# Patient Record
Sex: Male | Born: 1940 | Race: Black or African American | Hispanic: No | Marital: Married | Smoking: Former smoker
Health system: Southern US, Community
[De-identification: ages and names within clinical notes are randomized; demographics above are authoritative.]

## PROBLEM LIST (undated history)

## (undated) DIAGNOSIS — R0600 Dyspnea, unspecified: Secondary | ICD-10-CM

## (undated) DIAGNOSIS — N182 Chronic kidney disease, stage 2 (mild): Secondary | ICD-10-CM

## (undated) DIAGNOSIS — N4340 Spermatocele of epididymis, unspecified: Secondary | ICD-10-CM

## (undated) DIAGNOSIS — Z9289 Personal history of other medical treatment: Secondary | ICD-10-CM

## (undated) DIAGNOSIS — R011 Cardiac murmur, unspecified: Secondary | ICD-10-CM

## (undated) DIAGNOSIS — D649 Anemia, unspecified: Secondary | ICD-10-CM

## (undated) DIAGNOSIS — E119 Type 2 diabetes mellitus without complications: Secondary | ICD-10-CM

## (undated) DIAGNOSIS — J9601 Acute respiratory failure with hypoxia: Secondary | ICD-10-CM

## (undated) DIAGNOSIS — N186 End stage renal disease: Secondary | ICD-10-CM

## (undated) DIAGNOSIS — Z923 Personal history of irradiation: Secondary | ICD-10-CM

## (undated) DIAGNOSIS — M1990 Unspecified osteoarthritis, unspecified site: Secondary | ICD-10-CM

## (undated) DIAGNOSIS — I10 Essential (primary) hypertension: Secondary | ICD-10-CM

## (undated) DIAGNOSIS — F419 Anxiety disorder, unspecified: Secondary | ICD-10-CM

## (undated) DIAGNOSIS — Z9989 Dependence on other enabling machines and devices: Secondary | ICD-10-CM

## (undated) DIAGNOSIS — G4733 Obstructive sleep apnea (adult) (pediatric): Secondary | ICD-10-CM

## (undated) DIAGNOSIS — E7800 Pure hypercholesterolemia, unspecified: Secondary | ICD-10-CM

## (undated) DIAGNOSIS — C61 Malignant neoplasm of prostate: Secondary | ICD-10-CM

## (undated) DIAGNOSIS — J189 Pneumonia, unspecified organism: Secondary | ICD-10-CM

## (undated) DIAGNOSIS — Z972 Presence of dental prosthetic device (complete) (partial): Secondary | ICD-10-CM

## (undated) HISTORY — PX: COLONOSCOPY: SHX174

## (undated) HISTORY — PX: TONSILLECTOMY: SUR1361

## (undated) HISTORY — DX: Personal history of irradiation: Z92.3

---

## 1998-08-10 ENCOUNTER — Ambulatory Visit (HOSPITAL_COMMUNITY): Admission: RE | Admit: 1998-08-10 | Discharge: 1998-08-10 | Payer: Self-pay | Admitting: Gastroenterology

## 1999-07-12 ENCOUNTER — Encounter: Payer: Self-pay | Admitting: Cardiology

## 1999-07-12 ENCOUNTER — Ambulatory Visit (HOSPITAL_COMMUNITY): Admission: RE | Admit: 1999-07-12 | Discharge: 1999-07-12 | Payer: Self-pay | Admitting: Cardiology

## 2003-02-08 ENCOUNTER — Encounter: Payer: Self-pay | Admitting: Nephrology

## 2003-02-08 ENCOUNTER — Encounter: Admission: RE | Admit: 2003-02-08 | Discharge: 2003-02-08 | Payer: Self-pay | Admitting: Nephrology

## 2003-08-24 ENCOUNTER — Ambulatory Visit (HOSPITAL_COMMUNITY): Admission: RE | Admit: 2003-08-24 | Discharge: 2003-08-24 | Payer: Self-pay | Admitting: *Deleted

## 2003-08-24 ENCOUNTER — Encounter (INDEPENDENT_AMBULATORY_CARE_PROVIDER_SITE_OTHER): Payer: Self-pay | Admitting: *Deleted

## 2004-02-02 ENCOUNTER — Encounter: Admission: RE | Admit: 2004-02-02 | Discharge: 2004-02-02 | Payer: Self-pay | Admitting: Nephrology

## 2005-03-15 ENCOUNTER — Encounter: Admission: RE | Admit: 2005-03-15 | Discharge: 2005-03-15 | Payer: Self-pay | Admitting: Nephrology

## 2005-03-16 ENCOUNTER — Encounter: Admission: RE | Admit: 2005-03-16 | Discharge: 2005-03-16 | Payer: Self-pay | Admitting: Nephrology

## 2007-10-30 ENCOUNTER — Encounter: Admission: RE | Admit: 2007-10-30 | Discharge: 2007-10-30 | Payer: Self-pay | Admitting: Nephrology

## 2009-06-16 ENCOUNTER — Encounter: Admission: RE | Admit: 2009-06-16 | Discharge: 2009-06-16 | Payer: Self-pay | Admitting: Nephrology

## 2009-12-27 ENCOUNTER — Encounter: Admission: RE | Admit: 2009-12-27 | Discharge: 2009-12-27 | Payer: Self-pay | Admitting: Nephrology

## 2010-07-18 ENCOUNTER — Encounter: Admission: RE | Admit: 2010-07-18 | Discharge: 2010-07-18 | Payer: Self-pay | Admitting: Nephrology

## 2011-02-16 NOTE — Op Note (Signed)
NAME:  Michael Blackwell, Michael Blackwell                         ACCOUNT NO.:  [id number]   MEDICAL RECORD NO.:  [id number]                   PATIENT TYPE:  AMB   LOCATION:  ENDO                                 FACILITY:  [facility]   PHYSICIAN:  Waverly Ferrari, M.D.                 DATE OF BIRTH:  08/15/1941   DATE OF PROCEDURE:  08/24/2003  DATE OF DISCHARGE:                                 OPERATIVE REPORT   PROCEDURE:  Colonoscopy with polypectomy.   INDICATIONS:  Polyps.   ANESTHESIA:  Demerol 50, Versed 5 mg.   DESCRIPTION OF PROCEDURE:  With the patient mildly sedated in the left  lateral decubitus position, the Olympus video colonoscope was inserted in  the rectum and passed under direct vision to the cecum, identified by the  ileocecal valve and appendiceal orifice, both of which were photographed.   From this point the colonoscope was slowly withdrawn, taking circumferential  views of the entire colonic mucosa, stopping only at approximately  30 cm  from the anal verge, at which point 2 polyps were seen, photographed, and  both were removed using snare cautery technique, setting of 20/20 blended  current. Both were retrieved for pathology. There was good hemostasis.   The endoscope was then withdrawn all the way to the rectum which appeared  normal on direct and retroflex view. The endoscope was straightened and  withdrawn.   The patient's vital signs on pulse oximetry remained stable. The patient  tolerated the procedure well without apparent complications.   FINDINGS:  Polyps at 30 cm from the anal verge.   PLAN:  The patient will call me for results and follow up with me as an  outpatient.                                               Waverly Ferrari, M.D.    GMO/MEDQ  D:  08/24/2003  T:  08/24/2003  Job:  [id number]   cc:   Melburn Hake, M.D.  [address] [place]  [place] [address]  Fax: [phone]

## 2012-02-01 ENCOUNTER — Ambulatory Visit
Admission: RE | Admit: 2012-02-01 | Discharge: 2012-02-01 | Disposition: A | Discharge status: Home / Self Care | Payer: Medicare Other | Source: Ambulatory Visit | Attending: Nephrology | Admitting: Nephrology

## 2012-02-01 ENCOUNTER — Other Ambulatory Visit: Payer: Self-pay | Admitting: Nephrology

## 2012-02-01 DIAGNOSIS — F17200 Nicotine dependence, unspecified, uncomplicated: Secondary | ICD-10-CM

## 2013-02-06 ENCOUNTER — Ambulatory Visit
Admission: RE | Admit: 2013-02-06 | Discharge: 2013-02-06 | Disposition: A | Discharge status: Home / Self Care | Payer: Medicare Other | Source: Ambulatory Visit | Attending: Cardiology | Admitting: Cardiology

## 2013-02-06 ENCOUNTER — Other Ambulatory Visit: Payer: Self-pay | Admitting: Cardiology

## 2013-02-06 DIAGNOSIS — R5383 Other fatigue: Secondary | ICD-10-CM

## 2013-02-06 DIAGNOSIS — I10 Essential (primary) hypertension: Secondary | ICD-10-CM

## 2013-02-06 DIAGNOSIS — R0602 Shortness of breath: Secondary | ICD-10-CM

## 2013-05-25 ENCOUNTER — Other Ambulatory Visit (HOSPITAL_COMMUNITY): Payer: Self-pay | Admitting: Cardiology

## 2013-05-25 DIAGNOSIS — M25569 Pain in unspecified knee: Secondary | ICD-10-CM

## 2013-05-25 DIAGNOSIS — I70219 Atherosclerosis of native arteries of extremities with intermittent claudication, unspecified extremity: Secondary | ICD-10-CM

## 2013-06-03 ENCOUNTER — Ambulatory Visit (HOSPITAL_COMMUNITY)
Admission: RE | Admit: 2013-06-03 | Discharge: 2013-06-03 | Disposition: A | Discharge status: Home / Self Care | Payer: Medicare Other | Source: Ambulatory Visit | Attending: Cardiology | Admitting: Cardiology

## 2013-06-03 ENCOUNTER — Other Ambulatory Visit (HOSPITAL_COMMUNITY): Payer: Self-pay | Admitting: Cardiology

## 2013-06-03 DIAGNOSIS — I70219 Atherosclerosis of native arteries of extremities with intermittent claudication, unspecified extremity: Secondary | ICD-10-CM

## 2013-06-03 DIAGNOSIS — E119 Type 2 diabetes mellitus without complications: Secondary | ICD-10-CM | POA: Insufficient documentation

## 2013-06-03 DIAGNOSIS — M25569 Pain in unspecified knee: Secondary | ICD-10-CM

## 2013-06-03 DIAGNOSIS — F17200 Nicotine dependence, unspecified, uncomplicated: Secondary | ICD-10-CM | POA: Insufficient documentation

## 2013-06-03 DIAGNOSIS — I10 Essential (primary) hypertension: Secondary | ICD-10-CM | POA: Insufficient documentation

## 2013-06-03 NOTE — Progress Notes (Addendum)
VASCULAR LAB PRELIMINARY  ARTERIAL  ABI completed:    RIGHT    LEFT    PRESSURE WAVEFORM  PRESSURE WAVEFORM  BRACHIAL 124 Triphasic BRACHIAL 127 Triphasic  DP 97 Triphasic  93 Biphasic  PT 116 Biphasic PT 98 Monophasic    RIGHT LEFT  ABI 0.91 0.77   ABIs indicate a mild reduction in arterial flow on the right and a mild to moderate reduction on the left. Duplex scan revealed a 20% to 49% stenosis bilaterally. Mild to moderate irregular heterogeneous plaque was noted throughout bilaterally with diminished velocities in the lower leg  Jess Toney, RVS 06/03/2013, 5:03 PM

## 2013-11-19 DIAGNOSIS — C61 Malignant neoplasm of prostate: Secondary | ICD-10-CM

## 2013-11-19 HISTORY — DX: Malignant neoplasm of prostate: C61

## 2013-11-19 HISTORY — PX: PROSTATE BIOPSY: SHX241

## 2014-01-08 ENCOUNTER — Encounter: Payer: Self-pay | Admitting: Radiation Oncology

## 2014-01-08 NOTE — Progress Notes (Addendum)
GU Location of Tumor / Histology: prostate  If Prostate Cancer, Gleason Score is (4 + 3) and PSA is (6.13 on 09/28/13)  Patient presented 2 months ago with signs/symptoms of: increasing PSA  Biopsies of prostate (if applicable) revealed:  [id number]   Past/Anticipated interventions by urology, if any: biopsy  Past/Anticipated interventions by medical oncology, if any: none  Weight changes, if any: some recent weight gain after pt quit smoking  Bowel/Bladder complaints, if any:  Sensation of not emptying bladder, urgency, weak stream, nocturia x 2,  IPSS 12  Nausea/Vomiting, if any: no  Pain issues, if any:  no  SAFETY ISSUES:  Prior radiation? no  Pacemaker/ICD? no  Possible current pregnancy? na  Is the patient on methotrexate?  no  Current Complaints / other details: married, retired as [employer] fast streamer, 2 sons. Father possibly had prostate cancer, deceased age 55. Pt would like to consider radiation therapy.

## 2014-01-12 ENCOUNTER — Telehealth: Payer: Self-pay | Admitting: *Deleted

## 2014-01-12 ENCOUNTER — Ambulatory Visit
Admission: RE | Admit: 2014-01-12 | Discharge: 2014-01-12 | Disposition: A | Discharge status: Home / Self Care | Payer: Medicare Other | Source: Ambulatory Visit | Attending: Radiation Oncology | Admitting: Radiation Oncology

## 2014-01-12 ENCOUNTER — Encounter: Payer: Self-pay | Admitting: Radiation Oncology

## 2014-01-12 VITALS — BP 162/82 | HR 79 | Temp 98.2°F | Resp 20 | Ht 68.0 in | Wt 240.2 lb

## 2014-01-12 DIAGNOSIS — E7800 Pure hypercholesterolemia, unspecified: Secondary | ICD-10-CM | POA: Insufficient documentation

## 2014-01-12 DIAGNOSIS — Z87891 Personal history of nicotine dependence: Secondary | ICD-10-CM | POA: Diagnosis not present

## 2014-01-12 DIAGNOSIS — N529 Male erectile dysfunction, unspecified: Secondary | ICD-10-CM | POA: Insufficient documentation

## 2014-01-12 DIAGNOSIS — G4730 Sleep apnea, unspecified: Secondary | ICD-10-CM | POA: Diagnosis not present

## 2014-01-12 DIAGNOSIS — R351 Nocturia: Secondary | ICD-10-CM | POA: Diagnosis not present

## 2014-01-12 DIAGNOSIS — I10 Essential (primary) hypertension: Secondary | ICD-10-CM | POA: Insufficient documentation

## 2014-01-12 DIAGNOSIS — Z79899 Other long term (current) drug therapy: Secondary | ICD-10-CM | POA: Insufficient documentation

## 2014-01-12 DIAGNOSIS — Z510 Encounter for antineoplastic radiation therapy: Secondary | ICD-10-CM | POA: Insufficient documentation

## 2014-01-12 DIAGNOSIS — Z9189 Other specified personal risk factors, not elsewhere classified: Secondary | ICD-10-CM | POA: Diagnosis not present

## 2014-01-12 DIAGNOSIS — C61 Malignant neoplasm of prostate: Secondary | ICD-10-CM

## 2014-01-12 DIAGNOSIS — R350 Frequency of micturition: Secondary | ICD-10-CM | POA: Diagnosis not present

## 2014-01-12 DIAGNOSIS — Z7982 Long term (current) use of aspirin: Secondary | ICD-10-CM | POA: Diagnosis not present

## 2014-01-12 DIAGNOSIS — E119 Type 2 diabetes mellitus without complications: Secondary | ICD-10-CM | POA: Diagnosis not present

## 2014-01-12 HISTORY — DX: Essential (primary) hypertension: I10

## 2014-01-12 HISTORY — DX: Malignant neoplasm of prostate: C61

## 2014-01-12 HISTORY — DX: Spermatocele of epididymis, unspecified: N43.40

## 2014-01-12 HISTORY — DX: Unspecified osteoarthritis, unspecified site: M19.90

## 2014-01-12 HISTORY — DX: Pure hypercholesterolemia, unspecified: E78.00

## 2014-01-12 NOTE — Addendum Note (Signed)
Encounter addended by: Rexene Edison, MD on: 01/12/2014 10:06 AM<BR>     Documentation filed: Arn Medal VN

## 2014-01-12 NOTE — Telephone Encounter (Signed)
Called patient to inform of gold seed placement on 02-11-14, -arrival time 7:45 am @ Dr. Janice Norrie' Office, lvm for a return call

## 2014-01-12 NOTE — Progress Notes (Signed)
Please see the Nurse Progress Note in the MD Initial Consult Encounter for this patient. 

## 2014-01-12 NOTE — Progress Notes (Signed)
[facility] NEW PATIENT EVALUATION  Name: Michael Blackwell MRN: [id number]  Date:   01/12/2014           DOB: 1941-09-28  Status: outpatient   CC: Dr. Wallene Huh,   Lowella Bandy, MD    REFERRING PHYSICIAN: Lowella Bandy, MD   DIAGNOSIS: Stage TI C. intermediate risk adenocarcinoma prostate   HISTORY OF PRESENT ILLNESS:  Michael Blackwell is a 73 y.o. male who is seen today for the courtesy of Dr. Janice Norrie for discussion of possible radiation therapy in the management of his stage TI C. intermediate risk adenocarcinoma prostate. His PSA was 4.71 in April 2012, 3.5 in June of 2014, and then rising back to 4.58 by September 2014. It rose to 6.17 on 09/28/2013. He was referred to Dr. Janice Norrie for further evaluation. His ultrasound-guided biopsies on 11/19/2013 revealed Gleason 7 (4+3) and 20% of one core from the left lateral mid gland. He also had high-grade PIN along the left base. His gland volume was approximately 35 cc. He is doing reasonably well from a GU and GI standpoint. His I PSS score is 12. He does have erectile dysfunction and uses a vacuum device.  PREVIOUS RADIATION THERAPY: No   PAST MEDICAL HISTORY:  has a past medical history of Arthritis; Diabetes mellitus without complication; Hypertension; Hypercholesteremia; Spermatocele; Sleep apnea; and Prostate cancer (11/19/13).     PAST SURGICAL HISTORY:  Past Surgical History  Procedure Laterality Date  . Prostate biopsy  11/19/13    Gleason 4+3=7, vol 34.74 cc  . Tonsillectomy      as child     FAMILY HISTORY: family history includes Cancer in his brother, father, and mother. His father died of cancer at age 65, question prostate cancer. His mother died of some type of GI cancer at age 72.   SOCIAL HISTORY:  reports that he quit smoking about 3 months ago. His smoking use included Cigarettes. He has a 30 pack-year smoking history. He does not have any smokeless tobacco history on file. He reports that  he drinks alcohol. He reports that he does not use illicit drugs. Married, 2 sons ages 80 and 43. Retired Medical illustrator. He was in [employer] and exposed to agent orange while in [place].   ALLERGIES: Review of patient's allergies indicates no known allergies.   MEDICATIONS:  Current Outpatient Prescriptions  Medication Sig Dispense Refill  . amLODipine (NORVASC) 10 MG tablet Take 10 mg by mouth daily.      Marland Kitchen aspirin 81 MG tablet Take 81 mg by mouth daily.      Marland Kitchen atorvastatin (LIPITOR) 20 MG tablet Take 20 mg by mouth daily.      . calcium-vitamin D (OSCAL WITH D) 250-125 MG-UNIT per tablet Take 1 tablet by mouth daily.      . cloNIDine (CATAPRES) 0.2 MG tablet Take 0.2 mg by mouth 2 (two) times daily.      Marland Kitchen gemfibrozil (LOPID) 600 MG tablet Take 600 mg by mouth 2 (two) times daily before a meal.      . glimepiride (AMARYL) 4 MG tablet Take 4 mg by mouth daily with breakfast.      . glucosamine-chondroitin 500-400 MG tablet Take 1 tablet by mouth 3 (three) times daily.      Marland Kitchen losartan-hydrochlorothiazide (HYZAAR) 100-12.5 MG per tablet Take 1 tablet by mouth daily.      . metFORMIN (GLUCOPHAGE) 1000 MG tablet Take 1,000 mg by mouth 2 (two) times  daily with a meal.      . metFORMIN (GLUCOPHAGE) 500 MG tablet Take by mouth 2 (two) times daily with a meal.      . Multiple Vitamin (MULTIVITAMIN) capsule Take 1 capsule by mouth daily.      . Omega-3 Fatty Acids (FISH OIL BURP-LESS) 1200 MG CAPS Take by mouth.      . triamterene-hydrochlorothiazide (DYAZIDE) 37.5-25 MG per capsule Take 1 capsule by mouth daily.       No current facility-administered medications for this encounter.     REVIEW OF SYSTEMS:  Pertinent items are noted in HPI.    PHYSICAL EXAM:  height is 5\' 8"  (1.727 m) and weight is 240 lb 3.2 oz (108.954 kg). His oral temperature is 98.2 F (36.8 C). His blood pressure is 162/82 and his pulse is 79. His respiration is 20.   Alert and oriented. Head and neck  examination: Grossly unremarkable. Nodes: Without palpable cervical or supraclavicular lymphadenopathy. Chest: Lungs clear. Heart: Regular rate rhythm. Abdomen: Without masses organomegaly. Genitalia: Unremarkable to inspection. Rectal: The prostate gland is normal in size and is without focal induration or nodularity. Extremities: Without edema.   LABORATORY DATA:  No results found for this basename: WBC, HGB, HCT, MCV, PLT   No results found for this basename: NA, K, CL, CO2   No results found for this basename: ALT, AST, GGT, ALKPHOS, BILITOT   PSA 6.13 from 09/28/2013   IMPRESSION: Stage TI C. intermediate risk adenocarcinoma prostate. I explained to the patient that his prognosis is related to his stage, PSA level, and Gleason score. His stage and PSA level are favorable while his Gleason score of 7 is of intermediate favorability. Other prognostic factors include PSA doubling time and disease volume. Management options include surgery versus close surveillance/observation, versus radiation therapy. In view of his age and relatively good performance status in the absence of significant medical comorbidities, I do favor potentially curative treatment. Radiation therapy options include seed implantation with or without 5 weeks of external beam radiation therapy or 8 weeks of external beam/IMRT. It is debated as to whether not seed implantation alone would be optimal in a patient with (4+3) disease because of the risk for extracapsular extension. His I PSS score is borderline satisfactory for seed implantation. We discussed radiation therapy safety issues with seed implantation. We discussed the potential acute and late toxicities of radiation therapy. After lengthy discussion he is inclined to proceed with 8 weeks of external beam/IMRT which I think would be a good choice for him. We discussed having Dr. Janice Norrie placed 3 gold seed markers for image guidance. After this is performed, he can return here  for simulation/treatment planning. Consent is signed today.   PLAN: As discussed above. We'll also discussed the role of potential screening for prostate cancer for his 2 sons ages 69 and 40.  I spent 60 minutes minutes face to face with the patient and more than 50% of that time was spent in counseling and/or coordination of care.

## 2014-01-12 NOTE — Telephone Encounter (Signed)
Called patient to inform of gold seed placement on 02-11-14 @ 8 am @ Dr. Janice Norrie' Office and his sim on 02-15-14 @ 9 am @ Dr. Charlton Amor Office, spoke with patient and he is aware of these appts.

## 2014-02-15 ENCOUNTER — Ambulatory Visit
Admission: RE | Admit: 2014-02-15 | Discharge: 2014-02-15 | Disposition: A | Discharge status: Home / Self Care | Payer: Medicare Other | Source: Ambulatory Visit | Attending: Radiation Oncology | Admitting: Radiation Oncology

## 2014-02-15 DIAGNOSIS — Z510 Encounter for antineoplastic radiation therapy: Secondary | ICD-10-CM | POA: Diagnosis not present

## 2014-02-15 DIAGNOSIS — C61 Malignant neoplasm of prostate: Secondary | ICD-10-CM

## 2014-02-15 NOTE — Progress Notes (Signed)
Complex simulation/treatment planning note: The patient was taken to the CT simulator. A VAC LOC immobilization device was constructed. A red rubber catheter was placed within the rectal vault. He was then catheterized and contrast instilled into the bladder/urethra. I chose an isocenter along the central prostate. The CT data set was sent to the MIM contouring system where I contoured his prostate, rectum, seminal vesicles, bladder, and lower rectosigmoid colon. I'm prescribing 7800 cGy in 40 sessions to his prostate PTV which represents the prostate was 0.8 cm except for 0.5 cm along the rectum. I prescribing 5600 cGy in 40 sessions to his seminal vesicle PTV which represents the seminal vesicles was 0.5 cm. I requesting daily cone beam CT, setting up to his 3 gold seeds. I am also requesting that he be treated with a comfortably full bladder.

## 2014-02-16 ENCOUNTER — Encounter: Payer: Self-pay | Admitting: Radiation Oncology

## 2014-02-16 DIAGNOSIS — Z510 Encounter for antineoplastic radiation therapy: Secondary | ICD-10-CM | POA: Diagnosis not present

## 2014-02-16 NOTE — Progress Notes (Signed)
IMRT simulation/treatment planning note: The patient underwent IMRT simulation/treatment planning in the management of his carcinoma the prostate. IMRT was chosen to decrease the risk for both acute and late latter and rectal toxicity compared to conventional or 3-D conformal radiation therapy. Dose volume histograms were obtained for the target structures including the seminal vesicle PTV and prostate PTV in addition to avoidance structures including the bladder, rectum, and femoral heads. We met our departmental outlines. He is being treated with dual arc VMAT IMRT. I'm prescribing 7800 cGy in 40 sessions to his prostate PTV, and 5600 cGy in 40 sessions to his seminal vesicle PTV. I am requesting that he be treated with a company full bladder. I am requesting that he undergo daily cone beam CT, setting up to his 3 gold seeds.

## 2014-02-17 DIAGNOSIS — Z510 Encounter for antineoplastic radiation therapy: Secondary | ICD-10-CM | POA: Diagnosis not present

## 2014-02-25 ENCOUNTER — Ambulatory Visit
Admission: RE | Admit: 2014-02-25 | Discharge: 2014-02-25 | Disposition: A | Discharge status: Home / Self Care | Payer: Medicare Other | Source: Ambulatory Visit | Attending: Radiation Oncology | Admitting: Radiation Oncology

## 2014-02-25 DIAGNOSIS — C61 Malignant neoplasm of prostate: Secondary | ICD-10-CM

## 2014-02-25 DIAGNOSIS — Z510 Encounter for antineoplastic radiation therapy: Secondary | ICD-10-CM | POA: Diagnosis not present

## 2014-02-25 NOTE — Progress Notes (Signed)
Post sim ed completed w/patient. Gave pt "Radiation and You" booklet w/all pertinent information marked and discussed, re: fatigue, rectal discomfort/care, urinary/bladder irritation/management, nutrition, pain. Reviewed having bladder comfortably full. Pt verbalized understanding.

## 2014-02-26 ENCOUNTER — Ambulatory Visit
Admission: RE | Admit: 2014-02-26 | Discharge: 2014-02-26 | Disposition: A | Discharge status: Home / Self Care | Payer: Medicare Other | Source: Ambulatory Visit | Attending: Radiation Oncology | Admitting: Radiation Oncology

## 2014-02-26 DIAGNOSIS — Z510 Encounter for antineoplastic radiation therapy: Secondary | ICD-10-CM | POA: Diagnosis not present

## 2014-03-01 ENCOUNTER — Encounter: Payer: Self-pay | Admitting: Radiation Oncology

## 2014-03-01 ENCOUNTER — Ambulatory Visit
Admission: RE | Admit: 2014-03-01 | Discharge: 2014-03-01 | Disposition: A | Discharge status: Home / Self Care | Payer: Medicare Other | Source: Ambulatory Visit | Attending: Radiation Oncology | Admitting: Radiation Oncology

## 2014-03-01 VITALS — BP 158/66 | HR 41 | Temp 98.1°F | Resp 20 | Wt 240.7 lb

## 2014-03-01 DIAGNOSIS — C61 Malignant neoplasm of prostate: Secondary | ICD-10-CM

## 2014-03-01 DIAGNOSIS — Z510 Encounter for antineoplastic radiation therapy: Secondary | ICD-10-CM | POA: Diagnosis not present

## 2014-03-01 NOTE — Progress Notes (Signed)
Weekly Management Note:  Site: Prostate Current Dose:  585  cGy Projected Dose: 7800  cGy  Narrative: The patient is seen today for routine under treatment assessment. CBCT/MVCT images/port films were reviewed. The chart was reviewed.   Bladder filling was suboptimal today. I discussed this with the patient. No GU or GI difficulties.  Physical Examination:  Filed Vitals:   03/01/14 0935  BP: 158/66  Pulse: 41  Temp: 98.1 F (36.7 C)  Resp: 20  .  Weight: 240 lb 11.2 oz (109.181 kg). No change.  Impression: Tolerating radiation therapy well. He is to improve his bladder filling.  Plan: Continue radiation therapy as planned.

## 2014-03-01 NOTE — Progress Notes (Signed)
Pt denies pain, fatigue, loss of appetite, urinary/bowel issues. He states his BP may be high today "Because he has several things on his mind".

## 2014-03-02 ENCOUNTER — Ambulatory Visit
Admission: RE | Admit: 2014-03-02 | Discharge: 2014-03-02 | Disposition: A | Discharge status: Home / Self Care | Payer: Medicare Other | Source: Ambulatory Visit | Attending: Radiation Oncology | Admitting: Radiation Oncology

## 2014-03-02 DIAGNOSIS — Z510 Encounter for antineoplastic radiation therapy: Secondary | ICD-10-CM | POA: Diagnosis not present

## 2014-03-03 ENCOUNTER — Ambulatory Visit
Admission: RE | Admit: 2014-03-03 | Discharge: 2014-03-03 | Disposition: A | Discharge status: Home / Self Care | Payer: Medicare Other | Source: Ambulatory Visit | Attending: Radiation Oncology | Admitting: Radiation Oncology

## 2014-03-03 ENCOUNTER — Encounter: Payer: Self-pay | Admitting: Radiation Oncology

## 2014-03-03 DIAGNOSIS — Z510 Encounter for antineoplastic radiation therapy: Secondary | ICD-10-CM | POA: Diagnosis not present

## 2014-03-04 ENCOUNTER — Ambulatory Visit
Admission: RE | Admit: 2014-03-04 | Discharge: 2014-03-04 | Disposition: A | Discharge status: Home / Self Care | Payer: Medicare Other | Source: Ambulatory Visit | Attending: Radiation Oncology | Admitting: Radiation Oncology

## 2014-03-04 DIAGNOSIS — Z510 Encounter for antineoplastic radiation therapy: Secondary | ICD-10-CM | POA: Diagnosis not present

## 2014-03-05 ENCOUNTER — Ambulatory Visit
Admission: RE | Admit: 2014-03-05 | Discharge: 2014-03-05 | Disposition: A | Discharge status: Home / Self Care | Payer: Medicare Other | Source: Ambulatory Visit | Attending: Radiation Oncology | Admitting: Radiation Oncology

## 2014-03-05 DIAGNOSIS — Z510 Encounter for antineoplastic radiation therapy: Secondary | ICD-10-CM | POA: Diagnosis not present

## 2014-03-08 ENCOUNTER — Ambulatory Visit
Admission: RE | Admit: 2014-03-08 | Discharge: 2014-03-08 | Disposition: A | Discharge status: Home / Self Care | Payer: Medicare Other | Source: Ambulatory Visit | Attending: Radiation Oncology | Admitting: Radiation Oncology

## 2014-03-08 ENCOUNTER — Encounter: Payer: Self-pay | Admitting: Radiation Oncology

## 2014-03-08 VITALS — BP 161/73 | HR 68 | Temp 98.2°F | Resp 20 | Wt 243.1 lb

## 2014-03-08 DIAGNOSIS — Z510 Encounter for antineoplastic radiation therapy: Secondary | ICD-10-CM | POA: Diagnosis not present

## 2014-03-08 DIAGNOSIS — C61 Malignant neoplasm of prostate: Secondary | ICD-10-CM

## 2014-03-08 NOTE — Progress Notes (Signed)
   Weekly Management Note:  outpatient Current Dose:  15.6 Gy  Projected Dose: 78 Gy   Narrative:  The patient presents for routine under treatment assessment.  CBCT/MVCT images/Port film x-rays were reviewed.  The chart was checked. No complaints   Physical Findings:  weight is 243 lb 1.6 oz (110.269 kg). His oral temperature is 98.2 F (36.8 C). His blood pressure is 161/73 and his pulse is 68. His respiration is 20.  NAD  Impression:  The patient is tolerating radiotherapy.  Plan:  Continue radiotherapy as planned.   ________________________________   Eppie Gibson, M.D.

## 2014-03-08 NOTE — Progress Notes (Signed)
Pt denies pain, urinary/bowel issues, fatigue, loss of appetite. He states he wakes during nights w/"acid-like" feeling in his stomach. He eats snack, and it resolves.

## 2014-03-09 ENCOUNTER — Ambulatory Visit
Admission: RE | Admit: 2014-03-09 | Discharge: 2014-03-09 | Disposition: A | Discharge status: Home / Self Care | Payer: Medicare Other | Source: Ambulatory Visit | Attending: Radiation Oncology | Admitting: Radiation Oncology

## 2014-03-09 DIAGNOSIS — Z510 Encounter for antineoplastic radiation therapy: Secondary | ICD-10-CM | POA: Diagnosis not present

## 2014-03-10 ENCOUNTER — Ambulatory Visit
Admission: RE | Admit: 2014-03-10 | Discharge: 2014-03-10 | Disposition: A | Discharge status: Home / Self Care | Payer: Medicare Other | Source: Ambulatory Visit | Attending: Radiation Oncology | Admitting: Radiation Oncology

## 2014-03-10 DIAGNOSIS — Z510 Encounter for antineoplastic radiation therapy: Secondary | ICD-10-CM | POA: Diagnosis not present

## 2014-03-11 ENCOUNTER — Ambulatory Visit
Admission: RE | Admit: 2014-03-11 | Discharge: 2014-03-11 | Disposition: A | Discharge status: Home / Self Care | Payer: Medicare Other | Source: Ambulatory Visit | Attending: Radiation Oncology | Admitting: Radiation Oncology

## 2014-03-11 DIAGNOSIS — Z510 Encounter for antineoplastic radiation therapy: Secondary | ICD-10-CM | POA: Diagnosis not present

## 2014-03-12 ENCOUNTER — Ambulatory Visit
Admission: RE | Admit: 2014-03-12 | Discharge: 2014-03-12 | Disposition: A | Discharge status: Home / Self Care | Payer: Medicare Other | Source: Ambulatory Visit | Attending: Radiation Oncology | Admitting: Radiation Oncology

## 2014-03-12 DIAGNOSIS — Z510 Encounter for antineoplastic radiation therapy: Secondary | ICD-10-CM | POA: Diagnosis not present

## 2014-03-15 ENCOUNTER — Ambulatory Visit
Admission: RE | Admit: 2014-03-15 | Discharge: 2014-03-15 | Disposition: A | Discharge status: Home / Self Care | Payer: Medicare Other | Source: Ambulatory Visit | Attending: Radiation Oncology | Admitting: Radiation Oncology

## 2014-03-15 ENCOUNTER — Encounter: Payer: Self-pay | Admitting: Radiation Oncology

## 2014-03-15 VITALS — BP 150/74 | HR 77 | Temp 98.3°F | Resp 20 | Wt 241.0 lb

## 2014-03-15 DIAGNOSIS — Z510 Encounter for antineoplastic radiation therapy: Secondary | ICD-10-CM | POA: Diagnosis not present

## 2014-03-15 DIAGNOSIS — C61 Malignant neoplasm of prostate: Secondary | ICD-10-CM

## 2014-03-15 NOTE — Progress Notes (Signed)
Weekly Management Note:  Site: Ross date Current Dose:  2535  cGy Projected Dose: 7800  cGy  Narrative: The patient is seen today for routine under treatment assessment. CBCT/MVCT images/port films were reviewed. The chart was reviewed.   He is without complaints today except for indigestion. He took Prilosec in the past and now he is taking a generic. Bladder filling is satisfactory. Specifically, no GU or rectal difficulties.  Physical Examination:  Filed Vitals:   03/15/14 0945  BP: 150/74  Pulse: 77  Temp: 98.3 F (36.8 C)  Resp: 20  .  Weight: 241 lb (109.317 kg). No change.  Impression: Tolerating radiation therapy well.  Plan: Continue radiation therapy as planned.

## 2014-03-15 NOTE — Progress Notes (Signed)
Pt denies pain, urinary/bowel issues, fatigeu, loss of appetite. He states he "used to take Prilosec but stopped." he states he "may need to start back" for indigestion. He has taken an OTC "acid reducer" in the past, states he may try that first.

## 2014-03-16 ENCOUNTER — Ambulatory Visit
Admission: RE | Admit: 2014-03-16 | Discharge: 2014-03-16 | Disposition: A | Discharge status: Home / Self Care | Payer: Medicare Other | Source: Ambulatory Visit | Attending: Radiation Oncology | Admitting: Radiation Oncology

## 2014-03-16 DIAGNOSIS — Z510 Encounter for antineoplastic radiation therapy: Secondary | ICD-10-CM | POA: Diagnosis not present

## 2014-03-17 ENCOUNTER — Ambulatory Visit
Admission: RE | Admit: 2014-03-17 | Discharge: 2014-03-17 | Disposition: A | Discharge status: Home / Self Care | Payer: Medicare Other | Source: Ambulatory Visit | Attending: Radiation Oncology | Admitting: Radiation Oncology

## 2014-03-17 DIAGNOSIS — Z510 Encounter for antineoplastic radiation therapy: Secondary | ICD-10-CM | POA: Diagnosis not present

## 2014-03-18 ENCOUNTER — Ambulatory Visit
Admission: RE | Admit: 2014-03-18 | Discharge: 2014-03-18 | Disposition: A | Discharge status: Home / Self Care | Payer: Medicare Other | Source: Ambulatory Visit | Attending: Radiation Oncology | Admitting: Radiation Oncology

## 2014-03-18 DIAGNOSIS — Z510 Encounter for antineoplastic radiation therapy: Secondary | ICD-10-CM | POA: Diagnosis not present

## 2014-03-19 ENCOUNTER — Ambulatory Visit
Admission: RE | Admit: 2014-03-19 | Discharge: 2014-03-19 | Disposition: A | Discharge status: Home / Self Care | Payer: Medicare Other | Source: Ambulatory Visit | Attending: Radiation Oncology | Admitting: Radiation Oncology

## 2014-03-19 DIAGNOSIS — Z510 Encounter for antineoplastic radiation therapy: Secondary | ICD-10-CM | POA: Diagnosis not present

## 2014-03-22 ENCOUNTER — Ambulatory Visit
Admission: RE | Admit: 2014-03-22 | Discharge: 2014-03-22 | Disposition: A | Discharge status: Home / Self Care | Payer: Medicare Other | Source: Ambulatory Visit | Attending: Radiation Oncology | Admitting: Radiation Oncology

## 2014-03-22 ENCOUNTER — Encounter: Payer: Self-pay | Admitting: Radiation Oncology

## 2014-03-22 VITALS — BP 161/69 | HR 77 | Temp 98.2°F | Resp 20 | Wt 240.8 lb

## 2014-03-22 DIAGNOSIS — C61 Malignant neoplasm of prostate: Secondary | ICD-10-CM

## 2014-03-22 DIAGNOSIS — Z510 Encounter for antineoplastic radiation therapy: Secondary | ICD-10-CM | POA: Diagnosis not present

## 2014-03-22 NOTE — Progress Notes (Signed)
Weekly Management Note:  Site: Prostate Current Dose:  3510  cGy Projected Dose: 7800  cGy  Narrative: The patient is seen today for routine under treatment assessment. CBCT/MVCT images/port films were reviewed. The chart was reviewed.   Bladder filling is satisfactory. He is doing reasonably well from a GU and GI standpoint except for some slowing of his urination. He does have nocturia x3 but he admits to drinking a fair amount of fluid in the evening. He is not on alpha-blocker.  Physical Examination:  Filed Vitals:   03/22/14 0916  BP: 161/69  Pulse: 77  Temp: 98.2 F (36.8 C)  Resp: 20  .  Weight: 240 lb 12.8 oz (109.226 kg). No change.  Impression: Tolerating radiation therapy well. If his urination continues to slow then he may benefit from an alpha blocker.  Plan: Continue radiation therapy as planned.

## 2014-03-22 NOTE — Progress Notes (Signed)
Patient denies pain, fatigue, bladder/bowel issues. He states he does have weak stream at times. He is taking OTC medication for "indigestion" with relief.

## 2014-03-23 ENCOUNTER — Ambulatory Visit
Admission: RE | Admit: 2014-03-23 | Discharge: 2014-03-23 | Disposition: A | Discharge status: Home / Self Care | Payer: Medicare Other | Source: Ambulatory Visit | Attending: Radiation Oncology | Admitting: Radiation Oncology

## 2014-03-23 DIAGNOSIS — Z510 Encounter for antineoplastic radiation therapy: Secondary | ICD-10-CM | POA: Diagnosis not present

## 2014-03-24 ENCOUNTER — Ambulatory Visit: Payer: Medicare Other

## 2014-03-25 ENCOUNTER — Ambulatory Visit
Admission: RE | Admit: 2014-03-25 | Discharge: 2014-03-25 | Disposition: A | Discharge status: Home / Self Care | Payer: Medicare Other | Source: Ambulatory Visit | Attending: Radiation Oncology | Admitting: Radiation Oncology

## 2014-03-25 DIAGNOSIS — Z510 Encounter for antineoplastic radiation therapy: Secondary | ICD-10-CM | POA: Diagnosis not present

## 2014-03-26 ENCOUNTER — Ambulatory Visit
Admission: RE | Admit: 2014-03-26 | Discharge: 2014-03-26 | Disposition: A | Discharge status: Home / Self Care | Payer: Medicare Other | Source: Ambulatory Visit | Attending: Radiation Oncology | Admitting: Radiation Oncology

## 2014-03-26 DIAGNOSIS — Z510 Encounter for antineoplastic radiation therapy: Secondary | ICD-10-CM | POA: Diagnosis not present

## 2014-03-29 ENCOUNTER — Encounter: Payer: Self-pay | Admitting: Radiation Oncology

## 2014-03-29 ENCOUNTER — Ambulatory Visit
Admission: RE | Admit: 2014-03-29 | Discharge: 2014-03-29 | Disposition: A | Discharge status: Home / Self Care | Payer: Medicare Other | Source: Ambulatory Visit | Attending: Radiation Oncology | Admitting: Radiation Oncology

## 2014-03-29 VITALS — BP 129/71 | HR 75 | Temp 98.2°F | Resp 20 | Wt 239.1 lb

## 2014-03-29 DIAGNOSIS — C61 Malignant neoplasm of prostate: Secondary | ICD-10-CM

## 2014-03-29 DIAGNOSIS — Z510 Encounter for antineoplastic radiation therapy: Secondary | ICD-10-CM | POA: Diagnosis not present

## 2014-03-29 NOTE — Progress Notes (Signed)
Patient denies pain, fatigue, loss of appetite, bowel issues. He continues to have slow urinary stream, nocturia depending on how late at night he drinks fluids.

## 2014-03-29 NOTE — Progress Notes (Signed)
   Weekly Management Note:  outpatient Current Dose:  42.9 Gy  Projected Dose: 78 Gy  Prostate  Narrative:  The patient presents for routine under treatment assessment.  CBCT/MVCT images/Port film x-rays were reviewed.  The chart was checked. He is doing well. Urinary symptoms are stable.  Slow stream stable.  Physical Findings:  weight is 239 lb 1.6 oz (108.455 kg). His oral temperature is 98.2 F (36.8 C). His blood pressure is 129/71 and his pulse is 75. His respiration is 20.  NAD  Impression:  The patient is tolerating radiotherapy.  Plan:  Continue radiotherapy as planned. Patient wishes to wait and not use an alpha blocker yet.  ________________________________   Eppie Gibson, M.D.

## 2014-03-30 ENCOUNTER — Ambulatory Visit
Admission: RE | Admit: 2014-03-30 | Discharge: 2014-03-30 | Disposition: A | Discharge status: Home / Self Care | Payer: Medicare Other | Source: Ambulatory Visit | Attending: Radiation Oncology | Admitting: Radiation Oncology

## 2014-03-30 DIAGNOSIS — Z510 Encounter for antineoplastic radiation therapy: Secondary | ICD-10-CM | POA: Diagnosis not present

## 2014-03-31 ENCOUNTER — Ambulatory Visit
Admission: RE | Admit: 2014-03-31 | Discharge: 2014-03-31 | Disposition: A | Discharge status: Home / Self Care | Payer: Medicare Other | Source: Ambulatory Visit | Attending: Radiation Oncology | Admitting: Radiation Oncology

## 2014-03-31 DIAGNOSIS — Z510 Encounter for antineoplastic radiation therapy: Secondary | ICD-10-CM | POA: Diagnosis not present

## 2014-04-01 ENCOUNTER — Ambulatory Visit
Admission: RE | Admit: 2014-04-01 | Discharge: 2014-04-01 | Disposition: A | Discharge status: Home / Self Care | Payer: Medicare Other | Source: Ambulatory Visit | Attending: Radiation Oncology | Admitting: Radiation Oncology

## 2014-04-01 DIAGNOSIS — Z510 Encounter for antineoplastic radiation therapy: Secondary | ICD-10-CM | POA: Diagnosis not present

## 2014-04-05 ENCOUNTER — Ambulatory Visit
Admission: RE | Admit: 2014-04-05 | Discharge: 2014-04-05 | Disposition: A | Discharge status: Home / Self Care | Payer: Medicare Other | Source: Ambulatory Visit | Attending: Radiation Oncology | Admitting: Radiation Oncology

## 2014-04-05 ENCOUNTER — Encounter: Payer: Self-pay | Admitting: Radiation Oncology

## 2014-04-05 VITALS — BP 133/68 | HR 74 | Temp 98.2°F | Resp 20 | Wt 239.5 lb

## 2014-04-05 DIAGNOSIS — Z510 Encounter for antineoplastic radiation therapy: Secondary | ICD-10-CM | POA: Diagnosis not present

## 2014-04-05 DIAGNOSIS — C61 Malignant neoplasm of prostate: Secondary | ICD-10-CM

## 2014-04-05 NOTE — Progress Notes (Signed)
Weekly Management Note:  Site: Prostate Current Dose:  5070  cGy Projected Dose: 7800  cGy  Narrative: The patient is seen today for routine under treatment assessment. CBCT/MVCT images/port films were reviewed. The chart was reviewed.   Filling is satisfactory. He does have some slowing of his urinary stream with nocturia x3. He is not on an alpha blocker.  Physical Examination:  Filed Vitals:   04/05/14 0925  BP: 133/68  Pulse: 74  Temp: 98.2 F (36.8 C)  Resp: 20  .  Weight: 239 lb 8 oz (108.636 kg). No change.  Impression: Tolerating radiation therapy well. If his urinary frequency worsens or he has more slowing of his urinary stream, then I may start tamsulosin.  Plan: Continue radiation therapy as planned.

## 2014-04-05 NOTE — Progress Notes (Signed)
Weekly rad txs prostate 26/40 completed, nocturia x3, sloqw stream, drinks plenty water stated especially at night, regular bowel movements, appetite good, gets tired at times and rests no pain, no hematuria 9:27 AM

## 2014-04-06 ENCOUNTER — Ambulatory Visit
Admission: RE | Admit: 2014-04-06 | Discharge: 2014-04-06 | Disposition: A | Discharge status: Home / Self Care | Payer: Medicare Other | Source: Ambulatory Visit | Attending: Radiation Oncology | Admitting: Radiation Oncology

## 2014-04-06 ENCOUNTER — Ambulatory Visit: Payer: Medicare Other

## 2014-04-06 DIAGNOSIS — Z510 Encounter for antineoplastic radiation therapy: Secondary | ICD-10-CM | POA: Diagnosis not present

## 2014-04-07 ENCOUNTER — Ambulatory Visit: Payer: Medicare Other

## 2014-04-07 ENCOUNTER — Ambulatory Visit
Admission: RE | Admit: 2014-04-07 | Discharge: 2014-04-07 | Disposition: A | Discharge status: Home / Self Care | Payer: Medicare Other | Source: Ambulatory Visit | Attending: Radiation Oncology | Admitting: Radiation Oncology

## 2014-04-07 DIAGNOSIS — Z510 Encounter for antineoplastic radiation therapy: Secondary | ICD-10-CM | POA: Diagnosis not present

## 2014-04-08 ENCOUNTER — Ambulatory Visit
Admission: RE | Admit: 2014-04-08 | Discharge: 2014-04-08 | Disposition: A | Discharge status: Home / Self Care | Payer: Medicare Other | Source: Ambulatory Visit | Attending: Radiation Oncology | Admitting: Radiation Oncology

## 2014-04-08 DIAGNOSIS — Z510 Encounter for antineoplastic radiation therapy: Secondary | ICD-10-CM | POA: Diagnosis not present

## 2014-04-09 ENCOUNTER — Ambulatory Visit
Admission: RE | Admit: 2014-04-09 | Discharge: 2014-04-09 | Disposition: A | Discharge status: Home / Self Care | Payer: Medicare Other | Source: Ambulatory Visit | Attending: Radiation Oncology | Admitting: Radiation Oncology

## 2014-04-09 DIAGNOSIS — Z510 Encounter for antineoplastic radiation therapy: Secondary | ICD-10-CM | POA: Diagnosis not present

## 2014-04-12 ENCOUNTER — Ambulatory Visit
Admission: RE | Admit: 2014-04-12 | Discharge: 2014-04-12 | Disposition: A | Discharge status: Home / Self Care | Payer: Medicare Other | Source: Ambulatory Visit | Attending: Radiation Oncology | Admitting: Radiation Oncology

## 2014-04-12 ENCOUNTER — Encounter: Payer: Self-pay | Admitting: Radiation Oncology

## 2014-04-12 VITALS — BP 145/67 | HR 79 | Temp 99.3°F | Resp 20 | Wt 237.1 lb

## 2014-04-12 DIAGNOSIS — I10 Essential (primary) hypertension: Secondary | ICD-10-CM | POA: Insufficient documentation

## 2014-04-12 DIAGNOSIS — G4730 Sleep apnea, unspecified: Secondary | ICD-10-CM | POA: Insufficient documentation

## 2014-04-12 DIAGNOSIS — Z9189 Other specified personal risk factors, not elsewhere classified: Secondary | ICD-10-CM | POA: Insufficient documentation

## 2014-04-12 DIAGNOSIS — Z87891 Personal history of nicotine dependence: Secondary | ICD-10-CM | POA: Insufficient documentation

## 2014-04-12 DIAGNOSIS — Z510 Encounter for antineoplastic radiation therapy: Secondary | ICD-10-CM | POA: Diagnosis present

## 2014-04-12 DIAGNOSIS — N529 Male erectile dysfunction, unspecified: Secondary | ICD-10-CM | POA: Insufficient documentation

## 2014-04-12 DIAGNOSIS — R351 Nocturia: Secondary | ICD-10-CM | POA: Diagnosis not present

## 2014-04-12 DIAGNOSIS — Z79899 Other long term (current) drug therapy: Secondary | ICD-10-CM | POA: Diagnosis not present

## 2014-04-12 DIAGNOSIS — E119 Type 2 diabetes mellitus without complications: Secondary | ICD-10-CM | POA: Insufficient documentation

## 2014-04-12 DIAGNOSIS — Z7982 Long term (current) use of aspirin: Secondary | ICD-10-CM | POA: Diagnosis not present

## 2014-04-12 DIAGNOSIS — E7800 Pure hypercholesterolemia, unspecified: Secondary | ICD-10-CM | POA: Insufficient documentation

## 2014-04-12 DIAGNOSIS — R350 Frequency of micturition: Secondary | ICD-10-CM | POA: Insufficient documentation

## 2014-04-12 DIAGNOSIS — C61 Malignant neoplasm of prostate: Secondary | ICD-10-CM | POA: Insufficient documentation

## 2014-04-12 MED ORDER — TAMSULOSIN HCL 0.4 MG PO CAPS
0.4000 mg | ORAL_CAPSULE | Freq: Every day | ORAL | Status: DC
Start: 2014-04-12 — End: 2019-11-16

## 2014-04-12 NOTE — Progress Notes (Signed)
Patient denies pain, bowel issues. He states he continues to have dysuria, nocturia x 3, some loss of appetite in mornings and slight fatigue. Pt will discuss Flomax prescription w/dr Valere Dross today.

## 2014-04-12 NOTE — Progress Notes (Signed)
Weekly Management Note:  Site: Prostate Current Dose:  6045  cGy Projected Dose: 7800  cGy  Narrative: The patient is seen today for routine under treatment assessment. CBCT/MVCT images/port films were reviewed. The chart was reviewed.   Bladder filling is satisfactory. He still has nocturia x3 with some slowing of the stream. He inquires about starting Flomax.  Physical Examination:  Filed Vitals:   04/12/14 0928  BP: 145/67  Pulse: 79  Temp: 99.3 F (37.4 C)  Resp: 20  .  Weight: 237 lb 1.6 oz (107.548 kg). No change.  Impression: Tolerating radiation therapy well, however, he does have some obstructive symptomatology, and I'll start him on Flomax.  Plan: Continue radiation therapy as planned.

## 2014-04-13 ENCOUNTER — Ambulatory Visit
Admission: RE | Admit: 2014-04-13 | Discharge: 2014-04-13 | Disposition: A | Discharge status: Home / Self Care | Payer: Medicare Other | Source: Ambulatory Visit | Attending: Radiation Oncology | Admitting: Radiation Oncology

## 2014-04-13 DIAGNOSIS — Z510 Encounter for antineoplastic radiation therapy: Secondary | ICD-10-CM | POA: Diagnosis not present

## 2014-04-14 ENCOUNTER — Ambulatory Visit
Admission: RE | Admit: 2014-04-14 | Discharge: 2014-04-14 | Disposition: A | Discharge status: Home / Self Care | Payer: Medicare Other | Source: Ambulatory Visit | Attending: Radiation Oncology | Admitting: Radiation Oncology

## 2014-04-14 DIAGNOSIS — Z510 Encounter for antineoplastic radiation therapy: Secondary | ICD-10-CM | POA: Diagnosis not present

## 2014-04-15 ENCOUNTER — Ambulatory Visit
Admission: RE | Admit: 2014-04-15 | Discharge: 2014-04-15 | Disposition: A | Discharge status: Home / Self Care | Payer: Medicare Other | Source: Ambulatory Visit | Attending: Radiation Oncology | Admitting: Radiation Oncology

## 2014-04-15 DIAGNOSIS — Z510 Encounter for antineoplastic radiation therapy: Secondary | ICD-10-CM | POA: Diagnosis not present

## 2014-04-16 ENCOUNTER — Ambulatory Visit
Admission: RE | Admit: 2014-04-16 | Discharge: 2014-04-16 | Disposition: A | Discharge status: Home / Self Care | Payer: Medicare Other | Source: Ambulatory Visit | Attending: Radiation Oncology | Admitting: Radiation Oncology

## 2014-04-16 DIAGNOSIS — Z510 Encounter for antineoplastic radiation therapy: Secondary | ICD-10-CM | POA: Diagnosis not present

## 2014-04-19 ENCOUNTER — Ambulatory Visit
Admission: RE | Admit: 2014-04-19 | Discharge: 2014-04-19 | Disposition: A | Discharge status: Home / Self Care | Payer: Medicare Other | Source: Ambulatory Visit | Attending: Radiation Oncology | Admitting: Radiation Oncology

## 2014-04-19 VITALS — BP 123/61 | HR 81 | Temp 98.7°F | Resp 20 | Wt 239.2 lb

## 2014-04-19 DIAGNOSIS — C61 Malignant neoplasm of prostate: Secondary | ICD-10-CM

## 2014-04-19 DIAGNOSIS — Z510 Encounter for antineoplastic radiation therapy: Secondary | ICD-10-CM | POA: Diagnosis not present

## 2014-04-19 NOTE — Progress Notes (Signed)
Patient denies pain, loss of appetite, bowel issues. He states Flomax helpful w/nocturia. He was up twice last night. Pt will complete Fri; gave him 1 month FU card.

## 2014-04-19 NOTE — Progress Notes (Signed)
   Weekly Management Note:  outpatient    ICD-9-CM  1. Malignant neoplasm of prostate 185    Current Dose:  70.2 Gy  Projected Dose: 78 Gy   Narrative:  The patient presents for routine under treatment assessment.  CBCT/MVCT images/Port film x-rays were reviewed.  The chart was checked. denies pain, loss of appetite, bowel issues. He states Flomax helpful w/nocturia. He was up twice last night. Pt will complete Fri; gave him 1 month f/u card  Physical Findings:  weight is 239 lb 3.2 oz (108.5 kg). His oral temperature is 98.7 F (37.1 C). His blood pressure is 123/61 and his pulse is 81. His respiration is 20.  NAD  Impression:  The patient is tolerating radiotherapy.  Plan:  Continue radiotherapy as planned.   ________________________________   Eppie Gibson, M.D.

## 2014-04-20 ENCOUNTER — Ambulatory Visit
Admission: RE | Admit: 2014-04-20 | Discharge: 2014-04-20 | Disposition: A | Discharge status: Home / Self Care | Payer: Medicare Other | Source: Ambulatory Visit | Attending: Radiation Oncology | Admitting: Radiation Oncology

## 2014-04-20 DIAGNOSIS — Z510 Encounter for antineoplastic radiation therapy: Secondary | ICD-10-CM | POA: Diagnosis not present

## 2014-04-21 ENCOUNTER — Ambulatory Visit
Admission: RE | Admit: 2014-04-21 | Discharge: 2014-04-21 | Disposition: A | Discharge status: Home / Self Care | Payer: Medicare Other | Source: Ambulatory Visit | Attending: Radiation Oncology | Admitting: Radiation Oncology

## 2014-04-21 DIAGNOSIS — Z510 Encounter for antineoplastic radiation therapy: Secondary | ICD-10-CM | POA: Diagnosis not present

## 2014-04-22 ENCOUNTER — Ambulatory Visit: Payer: Medicare Other

## 2014-04-22 DIAGNOSIS — Z510 Encounter for antineoplastic radiation therapy: Secondary | ICD-10-CM | POA: Diagnosis not present

## 2014-04-23 ENCOUNTER — Ambulatory Visit: Payer: Medicare Other

## 2014-04-23 ENCOUNTER — Ambulatory Visit
Admission: RE | Admit: 2014-04-23 | Discharge: 2014-04-23 | Disposition: A | Discharge status: Home / Self Care | Payer: Medicare Other | Source: Ambulatory Visit | Attending: Radiation Oncology | Admitting: Radiation Oncology

## 2014-04-23 ENCOUNTER — Ambulatory Visit: Admission: RE | Admit: 2014-04-23 | Payer: Medicare Other | Source: Ambulatory Visit | Admitting: Radiation Oncology

## 2014-04-23 DIAGNOSIS — Z510 Encounter for antineoplastic radiation therapy: Secondary | ICD-10-CM | POA: Diagnosis not present

## 2014-04-25 ENCOUNTER — Encounter: Payer: Self-pay | Admitting: Radiation Oncology

## 2014-04-25 NOTE — Progress Notes (Signed)
[facility] End of Treatment Note  Name:Michael Blackwell  Date: 04/25/2014 [id number] DOB:04-16-1941   Status:outpatient    CC: Dr. Lowella Bandy,  Dr. Wallene Huh  REFERRING PHYSICIAN:    Dr. Lowella Bandy   DIAGNOSIS: Stage TI C. intermediate risk adenocarcinoma prostate   INDICATION FOR TREATMENT: Curative   TREATMENT DATES: 02/25/2014 through 04/23/2014                          SITE/DOSE:   Prostate 7800 cGy in 40 sessions, seminal vesicles 5600 cGy in 40 sessions                         BEAMS/ENERGY:   Dual ARC VMAT IMRT with 6 MV photons                NARRATIVE: He tolerated treatment well, but had some slowing of his urinary stream with worsening nocturia by completion of therapy. He was started on Flomax.                           PLAN: Routine followup in one month. Patient instructed to call if questions or worsening complaints in interim.

## 2014-04-25 NOTE — Progress Notes (Signed)
Chart note: On 02/25/2014 the patient began his IMRT and management of his carcinoma the prostate. He was treated with 2 arcs/VMAT IMRT with dynamic MLCs representing one set of IMRT treatment devices [id number]).

## 2014-05-21 ENCOUNTER — Encounter: Payer: Self-pay | Admitting: *Deleted

## 2014-05-25 ENCOUNTER — Ambulatory Visit
Admission: RE | Admit: 2014-05-25 | Discharge: 2014-05-25 | Disposition: A | Discharge status: Home / Self Care | Payer: Medicare Other | Source: Ambulatory Visit | Attending: Radiation Oncology | Admitting: Radiation Oncology

## 2014-05-25 ENCOUNTER — Encounter: Payer: Self-pay | Admitting: Radiation Oncology

## 2014-05-25 VITALS — BP 141/58 | HR 78 | Temp 97.6°F | Resp 20 | Wt 240.0 lb

## 2014-05-25 DIAGNOSIS — C61 Malignant neoplasm of prostate: Secondary | ICD-10-CM

## 2014-05-25 NOTE — Addendum Note (Signed)
Encounter addended by: Rexene Edison, MD on: 05/25/2014 11:28 AM<BR>     Documentation filed: Visit Diagnoses

## 2014-05-25 NOTE — Progress Notes (Signed)
CC: Dr. Lowella Bandy  Followup note: The patient returns today approximately 1 month following completion of external beam/IMRT in the management of his stage TI C. intermediate risk adenocarcinoma prostate. He is doing well from a GU and GI standpoint. His stream has returned to "normal". He still taking Flomax. He has an appointment to see Dr. Janice Norrie in September.  Physical examination: Alert and oriented. Filed Vitals:   05/25/14 1035  BP: 141/58  Pulse: 78  Temp: 97.6 F (36.4 C)  Resp: 20   Rectal examination not performed today.  Impression: Satisfactory progress. I told the patient that he may want to stop his Flomax one week before seeing Dr. Janice Norrie to see if he still needs an alpha blocker.  Plan: I've not scheduled the patient for a formal followup visit and I ask that Dr. Janice Norrie keep me posted on his progress.

## 2014-05-25 NOTE — Progress Notes (Signed)
Patient denies pain, fatigue, loss of appetite. He denies bowel issues and states he has nocturia x 2 but drinks a lot of fluids. He states his stream is "mostly normal" now. Pt is taking Flomax. He has FU with Dr Janice Norrie next month.

## 2015-05-05 ENCOUNTER — Ambulatory Visit (INDEPENDENT_AMBULATORY_CARE_PROVIDER_SITE_OTHER): Payer: Medicare Other

## 2015-05-05 DIAGNOSIS — R001 Bradycardia, unspecified: Secondary | ICD-10-CM

## 2015-12-29 ENCOUNTER — Other Ambulatory Visit: Payer: Self-pay | Admitting: Cardiology

## 2015-12-29 ENCOUNTER — Ambulatory Visit
Admission: RE | Admit: 2015-12-29 | Discharge: 2015-12-29 | Disposition: A | Discharge status: Home / Self Care | Payer: Medicare Other | Source: Ambulatory Visit | Attending: Cardiology | Admitting: Cardiology

## 2015-12-29 DIAGNOSIS — M25561 Pain in right knee: Secondary | ICD-10-CM

## 2015-12-29 DIAGNOSIS — M25562 Pain in left knee: Principal | ICD-10-CM

## 2016-01-30 ENCOUNTER — Telehealth: Payer: Self-pay | Admitting: Oncology

## 2016-01-30 ENCOUNTER — Encounter: Payer: Self-pay | Admitting: Oncology

## 2016-01-30 NOTE — Telephone Encounter (Signed)
Verified address and insurance, contacted referring office, mailed new pt packet, scheduled in take.

## 2016-02-15 ENCOUNTER — Encounter: Payer: Self-pay | Admitting: Oncology

## 2016-02-16 ENCOUNTER — Ambulatory Visit: Payer: Medicare Other | Admitting: Oncology

## 2016-03-01 ENCOUNTER — Ambulatory Visit (HOSPITAL_BASED_OUTPATIENT_CLINIC_OR_DEPARTMENT_OTHER): Payer: Medicare Other

## 2016-03-01 ENCOUNTER — Telehealth: Payer: Self-pay | Admitting: Oncology

## 2016-03-01 ENCOUNTER — Ambulatory Visit (HOSPITAL_BASED_OUTPATIENT_CLINIC_OR_DEPARTMENT_OTHER): Payer: Medicare Other | Admitting: Oncology

## 2016-03-01 VITALS — BP 149/64 | HR 77 | Temp 98.4°F | Resp 17 | Ht 68.0 in | Wt 231.6 lb

## 2016-03-01 DIAGNOSIS — E538 Deficiency of other specified B group vitamins: Secondary | ICD-10-CM

## 2016-03-01 DIAGNOSIS — E119 Type 2 diabetes mellitus without complications: Secondary | ICD-10-CM

## 2016-03-01 DIAGNOSIS — D649 Anemia, unspecified: Secondary | ICD-10-CM

## 2016-03-01 DIAGNOSIS — C61 Malignant neoplasm of prostate: Secondary | ICD-10-CM | POA: Diagnosis not present

## 2016-03-01 DIAGNOSIS — N289 Disorder of kidney and ureter, unspecified: Secondary | ICD-10-CM

## 2016-03-01 LAB — CBC WITH DIFFERENTIAL/PLATELET
BASO%: 0.6 % (ref 0.0–2.0)
Basophils Absolute: 0 10*3/uL (ref 0.0–0.1)
EOS ABS: 0.1 10*3/uL (ref 0.0–0.5)
EOS%: 1.4 % (ref 0.0–7.0)
HEMATOCRIT: 32.5 % — AB (ref 38.4–49.9)
HGB: 10.5 g/dL — ABNORMAL LOW (ref 13.0–17.1)
LYMPH#: 1.8 10*3/uL (ref 0.9–3.3)
LYMPH%: 29.1 % (ref 14.0–49.0)
MCH: 26.6 pg — ABNORMAL LOW (ref 27.2–33.4)
MCHC: 32.3 g/dL (ref 32.0–36.0)
MCV: 82.5 fL (ref 79.3–98.0)
MONO#: 0.4 10*3/uL (ref 0.1–0.9)
MONO%: 6.6 % (ref 0.0–14.0)
NEUT%: 62.3 % (ref 39.0–75.0)
NEUTROS ABS: 3.8 10*3/uL (ref 1.5–6.5)
PLATELETS: 254 10*3/uL (ref 140–400)
RBC: 3.94 10*6/uL — AB (ref 4.20–5.82)
RDW: 16.3 % — ABNORMAL HIGH (ref 11.0–14.6)
WBC: 6.1 10*3/uL (ref 4.0–10.3)

## 2016-03-01 LAB — COMPREHENSIVE METABOLIC PANEL
ALK PHOS: 44 U/L (ref 40–150)
ALT: 21 U/L (ref 0–55)
ANION GAP: 7 meq/L (ref 3–11)
AST: 15 U/L (ref 5–34)
Albumin: 3.1 g/dL — ABNORMAL LOW (ref 3.5–5.0)
BUN: 17.1 mg/dL (ref 7.0–26.0)
CALCIUM: 9.2 mg/dL (ref 8.4–10.4)
CO2: 25 meq/L (ref 22–29)
CREATININE: 1.5 mg/dL — AB (ref 0.7–1.3)
Chloride: 109 mEq/L (ref 98–109)
EGFR: 54 mL/min/{1.73_m2} — AB (ref >90)
Glucose: 151 mg/dl — ABNORMAL HIGH (ref 70–140)
Potassium: 3.6 mEq/L (ref 3.5–5.1)
Sodium: 141 mEq/L (ref 136–145)
TOTAL PROTEIN: 6.8 g/dL (ref 6.4–8.3)

## 2016-03-01 LAB — CHCC SMEAR

## 2016-03-01 LAB — IRON AND TIBC
%SAT: 22 % (ref 20–55)
IRON: 59 ug/dL (ref 42–163)
TIBC: 263 ug/dL (ref 202–409)
UIBC: 204 ug/dL (ref 117–376)

## 2016-03-01 LAB — FERRITIN: FERRITIN: 81 ng/mL (ref 22–316)

## 2016-03-01 NOTE — Progress Notes (Signed)
Please see consult note.  

## 2016-03-01 NOTE — Consult Note (Signed)
Reason for Referral: Anemia.   HPI: 75 year old gentleman currently of [place] where he lived the majority of his life. She gentleman with history of hypertension, diabetes and prostate cancer. He was diagnosed with prostate cancer in February 2015 after presenting with Gleason score 4+3 = 7 and a PSA of 6.17. Biopsy from Dr. 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 He received definitive radiation therapy under the care of Dr. Valere Dross completed in June 2015. He received a total of 7800 cGy. His PSA continues to be under reasonable control less than 1. He was noted by his primary care provider to have a hemoglobin around 10 and I was asked to evaluate his anemia. He been told that he has been anemic for over 10 years although he does not have any details. His last CBC obtained on 12/29/2015 showed a hemoglobin of 10.3, white cell count of 4.8 and a platelet count of 260. His RDW is slightly elevated at 17.0 with an MCV of 80.7. His creatinine is slightly elevated at 1.55 but normal liver function test. He is up-to-date on colonoscopy and have denied any rectal bleeding. He denies any hematochezia or melena. He denied any hemoptysis or hematemesis. He denied any skeletal pain or discomfort. He does report some occasional fatigue but no chest pain or difficulty breathing. Continues to perform activities of daily living without any decline.  He does not report any headaches, blurry vision, syncope or seizures. He does not report any fevers, chills or sweats. She does not report any cough, wheezing or hemoptysis. Does not report any nausea, vomiting or abdominal pain or changes bowel habits. He does not report any frequency urgency or hesitancy. He does not report any skeletal complaints. Remaining review of systems unremarkable.   Past Medical History  Diagnosis Date  . Arthritis   . Diabetes mellitus without complication   . Hypertension   . Hypercholesteremia   .  Spermatocele     right  . Sleep apnea   . Prostate cancer 11/19/13    gleason 4+3=7, volume 34.74 cc  . History of radiation therapy 02/25/14- 04/23/14    prostate 7800 cGy 40 sessions, seminal vesicles 5600 cGy 40 sessions  :  Past Surgical History  Procedure Laterality Date  . Prostate biopsy  11/19/13    Gleason 4+3=7, vol 34.74 cc  . Tonsillectomy      as child  :   Current outpatient prescriptions:  .  amLODipine (NORVASC) 10 MG tablet, Take 10 mg by mouth daily., Disp: , Rfl:  .  aspirin 81 MG tablet, Take 81 mg by mouth daily., Disp: , Rfl:  .  atorvastatin (LIPITOR) 20 MG tablet, Take 20 mg by mouth daily., Disp: , Rfl:  .  b complex vitamins tablet, Take 1 tablet by mouth daily., Disp: , Rfl:  .  calcium-vitamin D (OSCAL WITH D) 250-125 MG-UNIT per tablet, Take 1 tablet by mouth daily., Disp: , Rfl:  .  cloNIDine (CATAPRES) 0.1 MG tablet, Take 1 tablet by mouth daily., Disp: , Rfl:  .  cloNIDine (CATAPRES) 0.2 MG tablet, Take 0.2 mg by mouth 2 (two) times daily., Disp: , Rfl:  .  gemfibrozil (LOPID) 600 MG tablet, Take 600 mg by mouth 2 (two) times daily before a meal., Disp: , Rfl:  .  glimepiride (AMARYL) 4 MG tablet, Take 4 mg by mouth daily with breakfast., Disp: , Rfl:  .  glucosamine-chondroitin 500-400 MG tablet, Take 1 tablet by mouth 3 (three) times  daily., Disp: , Rfl:  .  losartan (COZAAR) 100 MG tablet, Take 100 mg by mouth daily., Disp: , Rfl:  .  losartan-hydrochlorothiazide (HYZAAR) 100-12.5 MG per tablet, Take 1 tablet by mouth daily., Disp: , Rfl:  .  metFORMIN (GLUCOPHAGE) 1000 MG tablet, Take 1,000 mg by mouth 2 (two) times daily with a meal., Disp: , Rfl:  .  metFORMIN (GLUCOPHAGE) 500 MG tablet, Take by mouth 2 (two) times daily with a meal., Disp: , Rfl:  .  Multiple Vitamin (MULTIVITAMIN) capsule, Take 1 capsule by mouth daily., Disp: , Rfl:  .  naproxen (NAPROSYN) 500 MG tablet, Take 500 mg by mouth daily., Disp: , Rfl:  .  Omega-3 Fatty Acids (FISH OIL  BURP-LESS) 1200 MG CAPS, Take by mouth., Disp: , Rfl:  .  ranitidine (ZANTAC) 150 MG capsule, Take 150 mg by mouth 2 (two) times daily., Disp: , Rfl:  .  tamsulosin (FLOMAX) 0.4 MG CAPS capsule, Take 1 capsule (0.4 mg total) by mouth daily., Disp: 30 capsule, Rfl: 3 .  triamterene-hydrochlorothiazide (DYAZIDE) 37.5-25 MG per capsule, Take 1 capsule by mouth daily., Disp: , Rfl:  .  triamterene-hydrochlorothiazide (MAXZIDE-25) 37.5-25 MG tablet, Take 1 tablet by mouth daily., Disp: , Rfl: :  No Known Allergies:  Family History  Problem Relation Age of Onset  . Cancer Brother     brain  . Cancer Mother     stomach  . Cancer Father     ? prostate  :  Social History   Social History  . Marital Status: Married    Spouse Name: N/A  . Number of Children: N/A  . Years of Education: N/A   Occupational History  . Not on file.   Social History Main Topics  . Smoking status: Former Smoker -- 1.00 packs/day for 30 years    Types: Cigarettes    Quit date: 09/30/2013  . Smokeless tobacco: Not on file  . Alcohol Use: Yes     Comment: 2 in 2 weeks  . Drug Use: No  . Sexual Activity: Not on file   Other Topics Concern  . Not on file   Social History Narrative  . No narrative on file  :  Pertinent items are noted in HPI.  Exam: Blood pressure 149/64, pulse 77, temperature 98.4 F (36.9 C), temperature source Oral, resp. rate 17, height 5' 8"  (1.727 m), weight 231 lb 9.6 oz (105.053 kg), SpO2 100 %.  ECOG 0 General appearance: alert and cooperative Head: Normocephalic, without obvious abnormality Throat: lips, mucosa, and tongue normal; teeth and gums normal Neck: no adenopathy Back: negative Resp: clear to auscultation bilaterally Chest wall: no tenderness Cardio: regular rate and rhythm, S1, S2 normal, no murmur, click, rub or gallop GI: soft, non-tender; bowel sounds normal; no masses,  no organomegaly Extremities: extremities normal, atraumatic, no cyanosis or  edema Skin: Skin color, texture, turgor normal. No rashes or lesions Lymph nodes: Cervical, supraclavicular, and axillary nodes normal.      Assessment and Plan:   75 year old gentleman with the following issues:  1. Normocytic, normochromic anemia with a hemoglobin of 10.3 without any other abnormalities. The differential diagnosis was discussed with the patient today. His anemia is multifactorial in nature. 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 Other causes such as multiple myeloma, myelodysplasia or leukemia are less likely.  From a management standpoint, I will  obtain a serum protein electrophoresis, erythropoietin level as well as repeat iron studies and B12 levels to complete the workup. He is asymptomatic at this time and does not require any transfusion or growth factor support. Depending on the findings he might require a bone marrow biopsy if there is no clear-cut cause for his anemia have been confirmed. Growth factor support in the form of Aranesp or Procrit could be also a possibility if indeed he has anemia of renal disease.  2. Prostate cancer: Diagnosed in 2015 and completed definitive radiation therapy without any complications.  3. Follow-up: Will be in 4 months to recheck his CBC.

## 2016-03-01 NOTE — Telephone Encounter (Signed)
per pof to schpt appt-sent back to lab-gave pt copy of avs °

## 2016-03-02 LAB — VITAMIN B12: Vitamin B12: 852 pg/mL (ref 211–946)

## 2016-03-02 LAB — ERYTHROPOIETIN: Erythropoietin: 18.3 m[IU]/mL (ref 2.6–18.5)

## 2016-03-05 LAB — MULTIPLE MYELOMA PANEL, SERUM
ALBUMIN SERPL ELPH-MCNC: 3.3 g/dL (ref 2.9–4.4)
ALPHA 1: 0.2 g/dL (ref 0.0–0.4)
ALPHA2 GLOB SERPL ELPH-MCNC: 1.3 g/dL — AB (ref 0.4–1.0)
Albumin/Glob SerPl: 1.2 (ref 0.7–1.7)
B-GLOBULIN SERPL ELPH-MCNC: 0.9 g/dL (ref 0.7–1.3)
GAMMA GLOB SERPL ELPH-MCNC: 0.5 g/dL (ref 0.4–1.8)
GLOBULIN, TOTAL: 2.9 g/dL (ref 2.2–3.9)
IGG (IMMUNOGLOBIN G), SERUM: 689 mg/dL — AB (ref 700–1600)
IgA, Qn, Serum: 113 mg/dL (ref 61–437)
IgM, Qn, Serum: 32 mg/dL (ref 15–143)
TOTAL PROTEIN: 6.2 g/dL (ref 6.0–8.5)

## 2016-05-21 ENCOUNTER — Other Ambulatory Visit (HOSPITAL_COMMUNITY): Payer: Self-pay | Admitting: Cardiology

## 2016-05-21 DIAGNOSIS — I739 Peripheral vascular disease, unspecified: Secondary | ICD-10-CM

## 2016-05-23 ENCOUNTER — Ambulatory Visit (HOSPITAL_COMMUNITY)
Admission: RE | Admit: 2016-05-23 | Discharge: 2016-05-23 | Disposition: A | Discharge status: Home / Self Care | Payer: Medicare Other | Source: Ambulatory Visit | Attending: Cardiology | Admitting: Cardiology

## 2016-05-23 DIAGNOSIS — I739 Peripheral vascular disease, unspecified: Secondary | ICD-10-CM | POA: Diagnosis not present

## 2016-05-23 NOTE — Progress Notes (Signed)
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 Appears unchanged from arterial testing from 06/03/13.    RIGHT    LEFT    PRESSURE WAVEFORM  PRESSURE WAVEFORM  BRACHIAL 166 Tri BRACHIAL 165 Tri  AT 103 Mono AT 117 Mono  PT 130 Bi PT 131 Bi    RIGHT LEFT  ABI 0.78 0.79     Landry Mellow, RDMS, RVT  05/23/2016, 10:14 AM

## 2016-07-04 ENCOUNTER — Ambulatory Visit: Payer: Medicare Other | Admitting: Oncology

## 2016-07-04 ENCOUNTER — Other Ambulatory Visit: Payer: Medicare Other

## 2016-07-24 ENCOUNTER — Other Ambulatory Visit: Payer: Self-pay | Admitting: Cardiology

## 2016-07-24 DIAGNOSIS — R079 Chest pain, unspecified: Secondary | ICD-10-CM

## 2016-07-27 ENCOUNTER — Encounter (HOSPITAL_COMMUNITY)
Admission: RE | Admit: 2016-07-27 | Discharge: 2016-07-27 | Disposition: A | Discharge status: Home / Self Care | Payer: Medicare Other | Source: Ambulatory Visit | Attending: Cardiology | Admitting: Cardiology

## 2016-07-27 DIAGNOSIS — R079 Chest pain, unspecified: Secondary | ICD-10-CM | POA: Diagnosis present

## 2016-07-27 MED ORDER — REGADENOSON 0.4 MG/5ML IV SOLN: 0.4000 mg | Freq: Once | INTRAVENOUS | Status: DC

## 2016-07-27 MED ORDER — TECHNETIUM TC 99M TETROFOSMIN IV KIT
30.0000 | PACK | Freq: Once | INTRAVENOUS | Status: AC | PRN
  Administered 2016-07-27: 30 via INTRAVENOUS

## 2016-07-27 MED ORDER — TECHNETIUM TC 99M TETROFOSMIN IV KIT
10.0000 | PACK | Freq: Once | INTRAVENOUS | Status: AC | PRN
  Administered 2016-07-27: 10 via INTRAVENOUS

## 2016-07-27 MED ORDER — REGADENOSON 0.4 MG/5ML IV SOLN
INTRAVENOUS | Status: AC
  Filled 2016-07-27: qty 5

## 2016-08-01 ENCOUNTER — Other Ambulatory Visit (HOSPITAL_COMMUNITY): Payer: Medicare Other

## 2016-08-01 ENCOUNTER — Encounter (HOSPITAL_COMMUNITY): Payer: Medicare Other

## 2016-11-22 ENCOUNTER — Emergency Department (HOSPITAL_COMMUNITY): Payer: Medicare Other

## 2016-11-22 ENCOUNTER — Encounter (HOSPITAL_COMMUNITY): Payer: Self-pay | Admitting: Emergency Medicine

## 2016-11-22 ENCOUNTER — Emergency Department (HOSPITAL_COMMUNITY)
Admission: EM | Admit: 2016-11-22 | Discharge: 2016-11-22 | Disposition: A | Discharge status: Home / Self Care | Payer: Medicare Other | Attending: Emergency Medicine | Admitting: Emergency Medicine

## 2016-11-22 DIAGNOSIS — Z8546 Personal history of malignant neoplasm of prostate: Secondary | ICD-10-CM | POA: Diagnosis not present

## 2016-11-22 DIAGNOSIS — E119 Type 2 diabetes mellitus without complications: Secondary | ICD-10-CM | POA: Insufficient documentation

## 2016-11-22 DIAGNOSIS — Z7982 Long term (current) use of aspirin: Secondary | ICD-10-CM | POA: Insufficient documentation

## 2016-11-22 DIAGNOSIS — I10 Essential (primary) hypertension: Secondary | ICD-10-CM | POA: Diagnosis not present

## 2016-11-22 DIAGNOSIS — R0789 Other chest pain: Secondary | ICD-10-CM | POA: Diagnosis not present

## 2016-11-22 DIAGNOSIS — R079 Chest pain, unspecified: Secondary | ICD-10-CM

## 2016-11-22 LAB — CBC
HCT: 32.9 % — ABNORMAL LOW (ref 39.0–52.0)
Hemoglobin: 10.7 g/dL — ABNORMAL LOW (ref 13.0–17.0)
MCH: 26.8 pg (ref 26.0–34.0)
MCHC: 32.5 g/dL (ref 30.0–36.0)
MCV: 82.3 fL (ref 78.0–100.0)
PLATELETS: 289 10*3/uL (ref 150–400)
RBC: 4 MIL/uL — ABNORMAL LOW (ref 4.22–5.81)
RDW: 18 % — ABNORMAL HIGH (ref 11.5–15.5)
WBC: 7.6 10*3/uL (ref 4.0–10.5)

## 2016-11-22 LAB — BASIC METABOLIC PANEL
Anion gap: 8 (ref 5–15)
BUN: 11 mg/dL (ref 6–20)
CALCIUM: 8.8 mg/dL — AB (ref 8.9–10.3)
CO2: 22 mmol/L (ref 22–32)
CREATININE: 1.89 mg/dL — AB (ref 0.61–1.24)
Chloride: 112 mmol/L — ABNORMAL HIGH (ref 101–111)
GFR calc non Af Amer: 34 mL/min — ABNORMAL LOW (ref >60)
GFR, EST AFRICAN AMERICAN: 39 mL/min — AB (ref >60)
Glucose, Bld: 100 mg/dL — ABNORMAL HIGH (ref 65–99)
Potassium: 3.4 mmol/L — ABNORMAL LOW (ref 3.5–5.1)
SODIUM: 142 mmol/L (ref 135–145)

## 2016-11-22 LAB — TROPONIN I: Troponin I: 0.05 ng/mL (ref <0.03)

## 2016-11-22 LAB — I-STAT TROPONIN, ED
TROPONIN I, POC: 0.05 ng/mL (ref 0.00–0.08)
Troponin i, poc: 0.06 ng/mL (ref 0.00–0.08)
Troponin i, poc: 0.04 ng/mL (ref 0.00–0.08)

## 2016-11-22 LAB — BRAIN NATRIURETIC PEPTIDE: B Natriuretic Peptide: 809.8 pg/mL — ABNORMAL HIGH (ref 0.0–100.0)

## 2016-11-22 MED ORDER — CLONIDINE HCL 0.2 MG PO TABS
0.2000 mg | ORAL_TABLET | Freq: Once | ORAL | Status: AC
  Administered 2016-11-22: 0.2 mg via ORAL
  Filled 2016-11-22: qty 1

## 2016-11-22 MED ORDER — ACETAMINOPHEN 500 MG PO TABS
1000.0000 mg | ORAL_TABLET | Freq: Once | ORAL | Status: DC
  Filled 2016-11-22: qty 2

## 2016-11-22 NOTE — ED Triage Notes (Addendum)
Pt reports lower left chest pain that began last night while he was driving. Denies pain at this time but states he had some pain this morning after waking, also reports some pain in right neck. Pt reports recent stress test that was clear 2-3 mos ago. Pt a/ox4, resp e/u, nad.

## 2016-11-22 NOTE — ED Notes (Signed)
Pt verbalized understanding discharge instructions and denies any further needs or questions at this time. VS stable, ambulatory and steady gait.   

## 2016-11-22 NOTE — ED Provider Notes (Signed)
[facility] DEPT Provider Note   CSN: [id number] Arrival date & time: 11/22/16  1257     History   Chief Complaint Chief Complaint  Patient presents with  . Chest Pain    HPI Michael Blackwell is a 76 y.o. male.  HPI Patient presents to the emergency department with complaints of left anterior chest pain.  No episode yesterday evening and then again recurred today while [place] the [place].  His never had pain like this before.  It is nonpleuritic.  He is unable to describe the pain any further than "a mild pain".  No diaphoresis.  No shortness of breath.  No known history of coronary artery disease.  He reports he had a normal stress test several months ago as he was being evaluated for shortness of breath at that time.  No history DVT or pulmonary embolism.  He also reports right neck pain since this morning.  He reports he awoke with some discomfort in his right neck that is worse with turning his head towards the right.  No weakness of his arms or legs.  No fevers or chills.  No productive cough.  No shortness of breath at this time.  Does report mild edema in his bilateral lower extremities which is new for him.  He reports this improves with elevation.  He's been compliant with his medications.    Past Medical History:  Diagnosis Date  . Arthritis   . Diabetes mellitus without complication ([place])   . History of radiation therapy 02/25/14- 04/23/14   prostate 7800 cGy 40 sessions, seminal vesicles 5600 cGy 40 sessions  . Hypercholesteremia   . Hypertension   . Prostate cancer ([place]) 11/19/13   gleason 4+3=7, volume 34.74 cc  . Sleep apnea   . Spermatocele    right    Patient Active Problem List   Diagnosis Date Noted  . Malignant neoplasm of prostate ([place]) 01/12/2014    Past Surgical History:  Procedure Laterality Date  . PROSTATE BIOPSY  11/19/13   Gleason 4+3=7, vol 34.74 cc  . TONSILLECTOMY     as child       Home Medications    Prior to Admission  medications   Medication Sig Start Date End Date Taking? Authorizing Provider  amLODipine (NORVASC) 10 MG tablet Take 10 mg by mouth daily.    Historical Provider, MD  aspirin 81 MG tablet Take 81 mg by mouth daily.    Historical Provider, MD  atorvastatin (LIPITOR) 20 MG tablet Take 20 mg by mouth daily.    Historical Provider, MD  b complex vitamins tablet Take 1 tablet by mouth daily.    Historical Provider, MD  calcium-vitamin D (OSCAL WITH D) 250-125 MG-UNIT per tablet Take 1 tablet by mouth daily.    Historical Provider, MD  cloNIDine (CATAPRES) 0.1 MG tablet Take 1 tablet by mouth daily. 12/12/15   Historical Provider, MD  cloNIDine (CATAPRES) 0.2 MG tablet Take 0.2 mg by mouth 2 (two) times daily.    Historical Provider, MD  gemfibrozil (LOPID) 600 MG tablet Take 600 mg by mouth 2 (two) times daily before a meal.    Historical Provider, MD  glimepiride (AMARYL) 4 MG tablet Take 4 mg by mouth daily with breakfast.    Historical Provider, MD  glucosamine-chondroitin 500-400 MG tablet Take 1 tablet by mouth 3 (three) times daily.    Historical Provider, MD  losartan (COZAAR) 100 MG tablet Take 100 mg by mouth daily. 01/25/16  Historical Provider, MD  losartan-hydrochlorothiazide (HYZAAR) 100-12.5 MG per tablet Take 1 tablet by mouth daily.    Historical Provider, MD  metFORMIN (GLUCOPHAGE) 1000 MG tablet Take 1,000 mg by mouth 2 (two) times daily with a meal.    Historical Provider, MD  metFORMIN (GLUCOPHAGE) 500 MG tablet Take by mouth 2 (two) times daily with a meal.    Historical Provider, MD  Multiple Vitamin (MULTIVITAMIN) capsule Take 1 capsule by mouth daily.    Historical Provider, MD  naproxen (NAPROSYN) 500 MG tablet Take 500 mg by mouth daily. 12/26/15   Historical Provider, MD  Omega-3 Fatty Acids (FISH OIL BURP-LESS) 1200 MG CAPS Take by mouth.    Historical Provider, MD  ranitidine (ZANTAC) 150 MG capsule Take 150 mg by mouth 2 (two) times daily.    Historical Provider, MD    tamsulosin (FLOMAX) 0.4 MG CAPS capsule Take 1 capsule (0.4 mg total) by mouth daily. 04/12/14   [name], MD  triamterene-hydrochlorothiazide (DYAZIDE) 37.5-25 MG per capsule Take 1 capsule by mouth daily.    Historical Provider, MD  triamterene-hydrochlorothiazide (MAXZIDE-25) 37.5-25 MG tablet Take 1 tablet by mouth daily. 02/02/16   Historical Provider, MD    Family History Family History  Problem Relation Age of Onset  . Cancer Brother     brain  . Cancer Mother     stomach  . Cancer Father     ? prostate    Social History Social History  Substance Use Topics  . Smoking status: Former Smoker    Packs/day: 1.00    Years: 30.00    Types: Cigarettes    Quit date: 09/30/2013  . Smokeless tobacco: Not on file  . Alcohol use Yes     Comment: 2 in 2 weeks     Allergies   Patient has no known allergies.   Review of Systems Review of Systems  All other systems reviewed and are negative.    Physical Exam Updated Vital Signs BP (!) 202/95 (BP Location: Right Arm) Comment: RN notified  Pulse 72   Temp 98.2 F (36.8 C) (Oral)   Resp 16   Ht 5\' 10"  (1.778 m)   Wt 225 lb (102.1 kg)   SpO2 98%   BMI 32.28 kg/m   Physical Exam  Constitutional: He is oriented to person, place, and time. He appears well-developed and well-nourished.  HENT:  Head: Normocephalic and atraumatic.  Eyes: EOM are normal.  Neck: Normal range of motion.  Cardiovascular: Normal rate, regular rhythm, normal heart sounds and intact distal pulses.   Pulmonary/Chest: Effort normal and breath sounds normal. No respiratory distress.  Abdominal: Soft. He exhibits no distension. There is no tenderness.  Musculoskeletal: Normal range of motion.  Neurological: He is alert and oriented to person, place, and time.  Skin: Skin is warm and dry.  Psychiatric: He has a normal mood and affect. Judgment normal.  Nursing note and vitals reviewed.    ED Treatments / Results  Labs (all labs ordered  are listed, but only abnormal results are displayed) Labs Reviewed  CBC - Abnormal; Notable for the following:       Result Value   RBC 4.00 (*)    Hemoglobin 10.7 (*)    HCT 32.9 (*)    RDW 18.0 (*)    All other components within normal limits  BASIC METABOLIC PANEL - Abnormal; Notable for the following:    Potassium 3.4 (*)    Chloride 112 (*)    Glucose,  Bld 100 (*)    Creatinine, Ser 1.89 (*)    Calcium 8.8 (*)    GFR calc non Af Amer 34 (*)    GFR calc Af Amer 39 (*)    All other components within normal limits  BRAIN NATRIURETIC PEPTIDE - Abnormal; Notable for the following:    B Natriuretic Peptide 809.8 (*)    All other components within normal limits  TROPONIN I - Abnormal; Notable for the following:    Troponin I 0.05 (*)    All other components within normal limits  I-STAT TROPOININ, ED  I-STAT TROPOININ, ED  I-STAT TROPOININ, ED    EKG  EKG Interpretation  Date/Time:  Thursday November 22 2016 13:02:14 EST Ventricular Rate:  81 PR Interval:  168 QRS Duration: 86 QT Interval:  412 QTC Calculation: 478 R Axis:   24 Text Interpretation:  Sinus rhythm with frequent Premature ventricular complexes Cannot rule out Anterior infarct , age undetermined Abnormal ECG No old tracing to compare Confirmed by Bellarose Burtt  MD, Venda Dice ([id number]) on 11/22/2016 3:20:47 PM       Radiology Dg Chest 2 View  Result Date: 11/22/2016 CLINICAL DATA:  Chest pain EXAM: CHEST  2 VIEW COMPARISON:  02/06/2013 FINDINGS: Cardiac enlargement without heart failure. Lungs are clear without infiltrate effusion or mass. No adenopathy. IMPRESSION: No active cardiopulmonary disease. Electronically Signed   By: Franchot Gallo M.D.   On: 11/22/2016 14:16    Procedures Procedures (including critical care time)  Medications Ordered in ED Medications  acetaminophen (TYLENOL) tablet 1,000 mg (1,000 mg Oral Refused 11/22/16 1556)  cloNIDine (CATAPRES) tablet 0.2 mg (0.2 mg Oral Given 11/22/16 1620)      Initial Impression / Assessment and Plan / ED Course  I have reviewed the triage vital signs and the nursing notes.  Pertinent labs & imaging results that were available during my care of the patient were reviewed by me and considered in my medical decision making (see chart for details).     Patient is overall well-appearing.  A symptomatically at this time.  No discomfort.  EKG without ischemic changes.  Initial troponin is negative.  He'll undergo second troponin second EKG.  Nonspecific.  Patient is noted to be hypertensive despite home blood pressure medications.  Will treat blood pressure.  Will monitor in the ER.  We'll reevaluate.  8:00 PM Patient continues to feel good at this time.  Discharge home in good condition.  Atypical chest pain.  Troponin negative 2.  EKG without ischemic changes 2.  Outpatient cardiology and primary care follow-up.  He understands return to the ER for new or worsening symptoms  Final Clinical Impressions(s) / ED Diagnoses   Final diagnoses:  Chest pain, unspecified type    New Prescriptions New Prescriptions   No medications on file     Jola Schmidt, MD 11/22/16 2000

## 2016-12-05 ENCOUNTER — Emergency Department (HOSPITAL_COMMUNITY): Payer: Medicare Other

## 2016-12-05 ENCOUNTER — Encounter (HOSPITAL_COMMUNITY): Payer: Self-pay | Admitting: Emergency Medicine

## 2016-12-05 ENCOUNTER — Inpatient Hospital Stay (HOSPITAL_COMMUNITY)
Admission: EM | Admit: 2016-12-05 | Discharge: 2016-12-10 | DRG: 291 | Disposition: A | Discharge status: Home / Self Care | Payer: Medicare Other | Attending: Internal Medicine | Admitting: Internal Medicine

## 2016-12-05 DIAGNOSIS — E1129 Type 2 diabetes mellitus with other diabetic kidney complication: Secondary | ICD-10-CM | POA: Diagnosis present

## 2016-12-05 DIAGNOSIS — E1121 Type 2 diabetes mellitus with diabetic nephropathy: Secondary | ICD-10-CM | POA: Diagnosis not present

## 2016-12-05 DIAGNOSIS — N183 Chronic kidney disease, stage 3 unspecified: Secondary | ICD-10-CM | POA: Diagnosis present

## 2016-12-05 DIAGNOSIS — Z8546 Personal history of malignant neoplasm of prostate: Secondary | ICD-10-CM | POA: Diagnosis not present

## 2016-12-05 DIAGNOSIS — I493 Ventricular premature depolarization: Secondary | ICD-10-CM | POA: Diagnosis not present

## 2016-12-05 DIAGNOSIS — Z79899 Other long term (current) drug therapy: Secondary | ICD-10-CM | POA: Diagnosis not present

## 2016-12-05 DIAGNOSIS — Z87891 Personal history of nicotine dependence: Secondary | ICD-10-CM | POA: Diagnosis not present

## 2016-12-05 DIAGNOSIS — Z7982 Long term (current) use of aspirin: Secondary | ICD-10-CM

## 2016-12-05 DIAGNOSIS — E785 Hyperlipidemia, unspecified: Secondary | ICD-10-CM | POA: Diagnosis present

## 2016-12-05 DIAGNOSIS — C61 Malignant neoplasm of prostate: Secondary | ICD-10-CM | POA: Diagnosis present

## 2016-12-05 DIAGNOSIS — E876 Hypokalemia: Secondary | ICD-10-CM | POA: Diagnosis present

## 2016-12-05 DIAGNOSIS — E1122 Type 2 diabetes mellitus with diabetic chronic kidney disease: Secondary | ICD-10-CM | POA: Diagnosis present

## 2016-12-05 DIAGNOSIS — I161 Hypertensive emergency: Secondary | ICD-10-CM | POA: Diagnosis present

## 2016-12-05 DIAGNOSIS — R0602 Shortness of breath: Secondary | ICD-10-CM | POA: Diagnosis not present

## 2016-12-05 DIAGNOSIS — I160 Hypertensive urgency: Secondary | ICD-10-CM | POA: Diagnosis present

## 2016-12-05 DIAGNOSIS — I5043 Acute on chronic combined systolic (congestive) and diastolic (congestive) heart failure: Secondary | ICD-10-CM | POA: Diagnosis present

## 2016-12-05 DIAGNOSIS — I130 Hypertensive heart and chronic kidney disease with heart failure and stage 1 through stage 4 chronic kidney disease, or unspecified chronic kidney disease: Secondary | ICD-10-CM | POA: Diagnosis present

## 2016-12-05 DIAGNOSIS — Z7984 Long term (current) use of oral hypoglycemic drugs: Secondary | ICD-10-CM

## 2016-12-05 DIAGNOSIS — I10 Essential (primary) hypertension: Secondary | ICD-10-CM | POA: Diagnosis not present

## 2016-12-05 DIAGNOSIS — J9601 Acute respiratory failure with hypoxia: Secondary | ICD-10-CM | POA: Diagnosis present

## 2016-12-05 DIAGNOSIS — Z923 Personal history of irradiation: Secondary | ICD-10-CM | POA: Diagnosis not present

## 2016-12-05 DIAGNOSIS — I509 Heart failure, unspecified: Secondary | ICD-10-CM

## 2016-12-05 DIAGNOSIS — Z808 Family history of malignant neoplasm of other organs or systems: Secondary | ICD-10-CM

## 2016-12-05 DIAGNOSIS — I5023 Acute on chronic systolic (congestive) heart failure: Secondary | ICD-10-CM | POA: Diagnosis not present

## 2016-12-05 DIAGNOSIS — I5041 Acute combined systolic (congestive) and diastolic (congestive) heart failure: Secondary | ICD-10-CM | POA: Diagnosis not present

## 2016-12-05 DIAGNOSIS — N179 Acute kidney failure, unspecified: Secondary | ICD-10-CM | POA: Diagnosis present

## 2016-12-05 DIAGNOSIS — Z800 Family history of malignant neoplasm of digestive organs: Secondary | ICD-10-CM | POA: Diagnosis not present

## 2016-12-05 DIAGNOSIS — E7800 Pure hypercholesterolemia, unspecified: Secondary | ICD-10-CM | POA: Diagnosis present

## 2016-12-05 DIAGNOSIS — N178 Other acute kidney failure: Secondary | ICD-10-CM | POA: Diagnosis not present

## 2016-12-05 DIAGNOSIS — D509 Iron deficiency anemia, unspecified: Secondary | ICD-10-CM | POA: Diagnosis present

## 2016-12-05 DIAGNOSIS — G4730 Sleep apnea, unspecified: Secondary | ICD-10-CM | POA: Diagnosis present

## 2016-12-05 HISTORY — DX: Obstructive sleep apnea (adult) (pediatric): G47.33

## 2016-12-05 HISTORY — DX: Acute respiratory failure with hypoxia: J96.01

## 2016-12-05 HISTORY — DX: Type 2 diabetes mellitus without complications: E11.9

## 2016-12-05 HISTORY — DX: Anemia, unspecified: D64.9

## 2016-12-05 HISTORY — DX: Chronic kidney disease, stage 2 (mild): N18.2

## 2016-12-05 HISTORY — DX: Pneumonia, unspecified organism: J18.9

## 2016-12-05 HISTORY — DX: Dependence on other enabling machines and devices: Z99.89

## 2016-12-05 LAB — COMPREHENSIVE METABOLIC PANEL
ALBUMIN: 2.3 g/dL — AB (ref 3.5–5.0)
ALK PHOS: 55 U/L (ref 38–126)
ALT: 23 U/L (ref 17–63)
AST: 30 U/L (ref 15–41)
Anion gap: 10 (ref 5–15)
BILIRUBIN TOTAL: 0.1 mg/dL — AB (ref 0.3–1.2)
BUN: 14 mg/dL (ref 6–20)
CO2: 21 mmol/L — ABNORMAL LOW (ref 22–32)
CREATININE: 2.26 mg/dL — AB (ref 0.61–1.24)
Calcium: 8.9 mg/dL (ref 8.9–10.3)
Chloride: 112 mmol/L — ABNORMAL HIGH (ref 101–111)
GFR calc Af Amer: 31 mL/min — ABNORMAL LOW (ref >60)
GFR, EST NON AFRICAN AMERICAN: 27 mL/min — AB (ref >60)
GLUCOSE: 267 mg/dL — AB (ref 65–99)
POTASSIUM: 3.5 mmol/L (ref 3.5–5.1)
Sodium: 143 mmol/L (ref 135–145)
TOTAL PROTEIN: 6.2 g/dL — AB (ref 6.5–8.1)

## 2016-12-05 LAB — I-STAT CG4 LACTIC ACID, ED: LACTIC ACID, VENOUS: 1.56 mmol/L (ref 0.5–1.9)

## 2016-12-05 LAB — BRAIN NATRIURETIC PEPTIDE: B Natriuretic Peptide: 865.2 pg/mL — ABNORMAL HIGH (ref 0.0–100.0)

## 2016-12-05 LAB — I-STAT TROPONIN, ED: Troponin i, poc: 0.07 ng/mL (ref 0.00–0.08)

## 2016-12-05 MED ORDER — FISH OIL 1000 MG PO CAPS: 1000.0000 mg | ORAL_CAPSULE | Freq: Every day | ORAL | Status: DC

## 2016-12-05 MED ORDER — FUROSEMIDE 10 MG/ML IJ SOLN
40.0000 mg | Freq: Once | INTRAMUSCULAR | Status: AC
  Administered 2016-12-05: 40 mg via INTRAVENOUS
  Filled 2016-12-05: qty 4

## 2016-12-05 MED ORDER — ATORVASTATIN CALCIUM 20 MG PO TABS
20.0000 mg | ORAL_TABLET | Freq: Every day | ORAL | Status: DC
  Administered 2016-12-06 – 2016-12-07 (×2): 20 mg via ORAL
  Filled 2016-12-05 (×2): qty 1

## 2016-12-05 MED ORDER — ENOXAPARIN SODIUM 40 MG/0.4ML ~~LOC~~ SOLN
40.0000 mg | SUBCUTANEOUS | Status: DC
  Administered 2016-12-06 – 2016-12-08 (×3): 40 mg via SUBCUTANEOUS
  Filled 2016-12-05 (×4): qty 0.4

## 2016-12-05 MED ORDER — INSULIN ASPART 100 UNIT/ML ~~LOC~~ SOLN: 0.0000 [IU] | Freq: Three times a day (TID) | SUBCUTANEOUS | Status: DC

## 2016-12-05 MED ORDER — CALCIUM-MAGNESIUM-ZINC 500-250-12.5 MG PO TABS: 1.0000 | ORAL_TABLET | Freq: Every day | ORAL | Status: DC

## 2016-12-05 MED ORDER — CALCIUM CARBONATE 1250 (500 CA) MG PO TABS
1.0000 | ORAL_TABLET | Freq: Every day | ORAL | Status: DC
  Administered 2016-12-06 – 2016-12-10 (×2): 500 mg via ORAL
  Filled 2016-12-05 (×5): qty 1

## 2016-12-05 MED ORDER — AMLODIPINE BESYLATE 5 MG PO TABS
2.5000 mg | ORAL_TABLET | Freq: Every day | ORAL | Status: DC
  Administered 2016-12-06: 2.5 mg via ORAL
  Filled 2016-12-05: qty 1

## 2016-12-05 MED ORDER — FUROSEMIDE 10 MG/ML IJ SOLN
40.0000 mg | Freq: Two times a day (BID) | INTRAMUSCULAR | Status: DC
  Administered 2016-12-06 – 2016-12-09 (×7): 40 mg via INTRAVENOUS
  Filled 2016-12-05 (×7): qty 4

## 2016-12-05 MED ORDER — FAMOTIDINE 20 MG PO TABS
20.0000 mg | ORAL_TABLET | Freq: Two times a day (BID) | ORAL | Status: DC
  Administered 2016-12-05 – 2016-12-08 (×6): 20 mg via ORAL
  Filled 2016-12-05 (×6): qty 1

## 2016-12-05 MED ORDER — NITROGLYCERIN 0.4 MG/HR TD PT24
0.4000 mg | MEDICATED_PATCH | Freq: Every day | TRANSDERMAL | Status: DC
  Administered 2016-12-05: 0.4 mg via TRANSDERMAL
  Filled 2016-12-05 (×4): qty 1

## 2016-12-05 MED ORDER — ACETAMINOPHEN 325 MG PO TABS: 650.0000 mg | ORAL_TABLET | Freq: Four times a day (QID) | ORAL | Status: DC | PRN

## 2016-12-05 MED ORDER — OMEGA-3-ACID ETHYL ESTERS 1 G PO CAPS
1.0000 g | ORAL_CAPSULE | Freq: Every day | ORAL | Status: DC
  Administered 2016-12-06 – 2016-12-10 (×5): 1 g via ORAL
  Filled 2016-12-05 (×6): qty 1

## 2016-12-05 MED ORDER — GEMFIBROZIL 600 MG PO TABS
600.0000 mg | ORAL_TABLET | Freq: Two times a day (BID) | ORAL | Status: DC
  Administered 2016-12-06 – 2016-12-10 (×6): 600 mg via ORAL
  Filled 2016-12-05 (×10): qty 1

## 2016-12-05 MED ORDER — ASPIRIN 81 MG PO CHEW
81.0000 mg | CHEWABLE_TABLET | Freq: Every day | ORAL | Status: DC
  Administered 2016-12-05 – 2016-12-10 (×6): 81 mg via ORAL
  Filled 2016-12-05 (×6): qty 1

## 2016-12-05 MED ORDER — CLONIDINE HCL 0.1 MG PO TABS
0.1000 mg | ORAL_TABLET | Freq: Two times a day (BID) | ORAL | Status: DC
  Administered 2016-12-05 – 2016-12-10 (×10): 0.1 mg via ORAL
  Filled 2016-12-05 (×10): qty 1

## 2016-12-05 MED ORDER — HYDRALAZINE HCL 20 MG/ML IJ SOLN
10.0000 mg | INTRAMUSCULAR | Status: DC | PRN
  Administered 2016-12-06: 10 mg via INTRAVENOUS
  Filled 2016-12-05: qty 1

## 2016-12-05 MED ORDER — ACETAMINOPHEN 650 MG RE SUPP: 650.0000 mg | Freq: Four times a day (QID) | RECTAL | Status: DC | PRN

## 2016-12-05 MED ORDER — GLIMEPIRIDE 4 MG PO TABS
4.0000 mg | ORAL_TABLET | Freq: Every day | ORAL | Status: DC
  Administered 2016-12-06 – 2016-12-10 (×5): 4 mg via ORAL
  Filled 2016-12-05 (×6): qty 1

## 2016-12-05 MED ORDER — TAMSULOSIN HCL 0.4 MG PO CAPS
0.4000 mg | ORAL_CAPSULE | Freq: Every day | ORAL | Status: DC
  Administered 2016-12-05 – 2016-12-10 (×5): 0.4 mg via ORAL
  Filled 2016-12-05 (×6): qty 1

## 2016-12-05 MED ORDER — TRIAMTERENE-HCTZ 37.5-25 MG PO TABS
1.0000 | ORAL_TABLET | Freq: Every day | ORAL | Status: DC
  Administered 2016-12-06: 1 via ORAL
  Filled 2016-12-05: qty 1

## 2016-12-05 MED ORDER — ADULT MULTIVITAMIN W/MINERALS CH
1.0000 | ORAL_TABLET | Freq: Every day | ORAL | Status: DC
  Administered 2016-12-06 – 2016-12-10 (×3): 1 via ORAL
  Filled 2016-12-05 (×5): qty 1

## 2016-12-05 MED ORDER — MAGNESIUM OXIDE 400 (241.3 MG) MG PO TABS
200.0000 mg | ORAL_TABLET | Freq: Every day | ORAL | Status: DC
  Administered 2016-12-06 – 2016-12-10 (×3): 200 mg via ORAL
  Filled 2016-12-05 (×2): qty 1
  Filled 2016-12-05: qty 0.5
  Filled 2016-12-05 (×3): qty 1

## 2016-12-05 MED ORDER — POTASSIUM CHLORIDE CRYS ER 10 MEQ PO TBCR
10.0000 meq | EXTENDED_RELEASE_TABLET | Freq: Every day | ORAL | Status: DC
  Administered 2016-12-05 – 2016-12-06 (×2): 10 meq via ORAL
  Filled 2016-12-05 (×2): qty 1

## 2016-12-05 NOTE — ED Triage Notes (Signed)
Pt brought to ED by EMS from home for respiratory distress, pt states he finished his dinner and started getting very SOB and diaphoretic, pt unable to get his breath. On EMS arrival pt was on 78% RA with rales on all lung fields, pt placed on CPAP by [facility] pta to ED and SPO2 got improved to 94%. BP 220/120, HR 110, R 24, CBG 140, ST with frequent PVC on monitor no prior hx of respiratory or cardiac problems.

## 2016-12-05 NOTE — H&P (Signed)
History and Physical    [name] IEP:[id number] DOB: 1940-10-30 DOA: 12/05/2016  PCP: [name], MD  Patient coming from: Home.  Chief Complaint: Shortness of breath.  HPI: Michael Blackwell is a 76 y.o. male with history of hypertension, diabetes mellitus type 2, history of prostate cancer status post radiation treatment, hyperlipidemia presented to the ER because of shortness of breath. Patient started developing worsening shortness of breath last evening. Denies any chest pain pleuritic cough or fever or chills. Patient did have chest pain 3 weeks ago and had come to the ER at that time workup was negative.   ED Course: Chest x-ray in the ER shows pleural effusion. Patient had markedly elevated blood pressure. Patient was placed on BiPAP and given Lasix 40 mg IV. On my exam patient is nondistended but still on BiPAP. Patient states he has been having increasing lower extremity edema and exertional shortness of breath.  Review of Systems: As per HPI, rest all negative.   Past Medical History:  Diagnosis Date  . Arthritis   . Diabetes mellitus without complication ([place])   . History of radiation therapy 02/25/14- 04/23/14   prostate 7800 cGy 40 sessions, seminal vesicles 5600 cGy 40 sessions  . Hypercholesteremia   . Hypertension   . Prostate cancer ([place]) 11/19/13   gleason 4+3=7, volume 34.74 cc  . Sleep apnea   . Spermatocele    right    Past Surgical History:  Procedure Laterality Date  . PROSTATE BIOPSY  11/19/13   Gleason 4+3=7, vol 34.74 cc  . TONSILLECTOMY     as child     reports that he quit smoking about 3 years ago. His smoking use included Cigarettes. He has a 30.00 pack-year smoking history. He has never used smokeless tobacco. He reports that he drinks alcohol. He reports that he does not use drugs.  No Known Allergies  Family History  Problem Relation Age of Onset  . Cancer Brother     brain  . Cancer Mother     stomach  . Cancer Father     ?  prostate    Prior to Admission medications   Medication Sig Start Date End Date Taking? Authorizing Provider  amLODipine (NORVASC) 5 MG tablet Take 2.5 mg by mouth daily.    Yes Historical Provider, MD  aspirin 81 MG tablet Take 81 mg by mouth daily.   Yes Historical Provider, MD  atorvastatin (LIPITOR) 20 MG tablet Take 20 mg by mouth daily.   Yes Historical Provider, MD  B Complex-C (SUPER B COMPLEX/VITAMIN C) TABS Take 1 tablet by mouth daily.   Yes Historical Provider, MD  Calcium-Magnesium-Zinc 500-250-12.5 MG TABS Take 1-2 tablets by mouth daily.    Yes Historical Provider, MD  cloNIDine (CATAPRES) 0.1 MG tablet Take 1 tablet by mouth 2 (two) times daily.  12/12/15  Yes Historical Provider, MD  fexofenadine (ALLEGRA) 180 MG tablet Take 180 mg by mouth daily as needed for allergies or rhinitis.   Yes Historical Provider, MD  gemfibrozil (LOPID) 600 MG tablet Take 600 mg by mouth 2 (two) times daily before a meal.   Yes Historical Provider, MD  glimepiride (AMARYL) 4 MG tablet Take 4 mg by mouth daily with breakfast.   Yes Historical Provider, MD  losartan (COZAAR) 100 MG tablet Take 100 mg by mouth every morning.  01/25/16  Yes Historical Provider, MD  metFORMIN (GLUCOPHAGE) 500 MG tablet Take 500 mg by mouth 2 (two) times daily.  Yes Historical Provider, MD  Misc Natural Products (GLUCOSAMINE CHONDROITIN TRIPLE) TABS Take 1-2 tablets by mouth daily.   Yes Historical Provider, MD  Multiple Vitamins-Minerals (CENTRUM SILVER 50+MEN) TABS Take 1 tablet by mouth daily.   Yes Historical Provider, MD  naproxen sodium (ANAPROX) 220 MG tablet Take 220-440 mg by mouth 2 (two) times daily as needed (for pain or headaches).   Yes Historical Provider, MD  Omega-3 Fatty Acids (FISH OIL) 1000 MG CAPS Take 1,000 mg by mouth daily.   Yes Historical Provider, MD  ranitidine (ZANTAC) 150 MG capsule Take 150 mg by mouth daily before breakfast.    Yes Historical Provider, MD  tamsulosin (FLOMAX) 0.4 MG CAPS  capsule Take 1 capsule (0.4 mg total) by mouth daily. 04/12/14  Yes [name], MD  Tetrahydroz-Dextran-PEG-Povid [facility] ADVANCED RELIEF) 0.05-0.1-1-1 % SOLN Place 1-2 drops into both eyes 3 (three) times daily as needed (for eye irritation).   Yes Historical Provider, MD  triamterene-hydrochlorothiazide (MAXZIDE-25) 37.5-25 MG tablet Take 1 tablet by mouth daily.   Yes Historical Provider, MD    Physical Exam: Vitals:   12/05/16 2030 12/05/16 2045 12/05/16 2115 12/05/16 2130  BP: (!) 202/88 (!) 201/89 (!) 203/87 (!) 203/98  Pulse: 96 92 86 88  Resp: (!) 38 (!) 27 22 22   Temp:      TempSrc:      SpO2: 92% 100% 100% 98%  Weight:      Height:          Constitutional: Moderately built and nourished. Vitals:   12/05/16 2030 12/05/16 2045 12/05/16 2115 12/05/16 2130  BP: (!) 202/88 (!) 201/89 (!) 203/87 (!) 203/98  Pulse: 96 92 86 88  Resp: (!) 38 (!) 27 22 22   Temp:      TempSrc:      SpO2: 92% 100% 100% 98%  Weight:      Height:       Eyes: Anicteric. No pallor. ENMT: No discharge from the ears eyes nose and mouth. Neck: JVD elevated no mass felt. Respiratory: No rhonchi or crepitations. Cardiovascular: S1-S2 no murmurs appreciated. Abdomen: Soft nontender bowel sounds present. Musculoskeletal: Bilateral lower extremity edema. Skin: No rash. Skin appears warm. Neurologic: Alert awake oriented to time place and person. Moves all extremities. Psychiatric: Appears normal. Normal affect.   Labs on Admission: I have personally reviewed following labs and imaging studies  CBC: No results for input(s): WBC, NEUTROABS, HGB, HCT, MCV, PLT in the last 168 hours. Basic Metabolic Panel:  Recent Labs Lab 12/05/16 1934  NA 143  K 3.5  CL 112*  CO2 21*  GLUCOSE 267*  BUN 14  CREATININE 2.26*  CALCIUM 8.9   GFR: Estimated Creatinine Clearance: 34.8 mL/min (by C-G formula based on SCr of 2.26 mg/dL (H)). Liver Function Tests:  Recent Labs Lab 12/05/16 1934  AST 30    ALT 23  ALKPHOS 55  BILITOT 0.1*  PROT 6.2*  ALBUMIN 2.3*   No results for input(s): LIPASE, AMYLASE in the last 168 hours. No results for input(s): AMMONIA in the last 168 hours. Coagulation Profile: No results for input(s): INR, PROTIME in the last 168 hours. Cardiac Enzymes: No results for input(s): CKTOTAL, CKMB, CKMBINDEX, TROPONINI in the last 168 hours. BNP (last 3 results) No results for input(s): PROBNP in the last 8760 hours. HbA1C: No results for input(s): HGBA1C in the last 72 hours. CBG: No results for input(s): GLUCAP in the last 168 hours. Lipid Profile: No results for input(s): CHOL, HDL,  LDLCALC, TRIG, CHOLHDL, LDLDIRECT in the last 72 hours. Thyroid Function Tests: No results for input(s): TSH, T4TOTAL, FREET4, T3FREE, THYROIDAB in the last 72 hours. Anemia Panel: No results for input(s): VITAMINB12, FOLATE, FERRITIN, TIBC, IRON, RETICCTPCT in the last 72 hours. Urine analysis: No results found for: COLORURINE, APPEARANCEUR, LABSPEC, PHURINE, GLUCOSEU, HGBUR, BILIRUBINUR, KETONESUR, PROTEINUR, UROBILINOGEN, NITRITE, LEUKOCYTESUR Sepsis Labs: @LABRCNTIP (procalcitonin:4,lacticidven:4) )No results found for this or any previous visit (from the past 240 hour(s)).   Radiological Exams on Admission: Dg Chest Portable 1 View  Result Date: 12/05/2016 CLINICAL DATA:  Respiratory distress EXAM: PORTABLE CHEST 1 VIEW COMPARISON:  11/22/2016 FINDINGS: Suspect that there are small pleural effusions. Ground-glass densities are present within the left greater than right upper lobe and the left lower lung, suspicious for edema. There is mild cardiomegaly. No pneumothorax. Cannot exclude in infiltrate at the left lung base. IMPRESSION: 1. Cardiomegaly with suspected small effusions 2. Bilateral left greater than right ground-glass densities suggestive of edema 3. Cannot exclude atelectasis or pneumonia at the left lung base. Electronically Signed   By: Donavan Foil M.D.   On:  12/05/2016 19:59    EKG: Independently reviewed. Normal sinus rhythm.  Assessment/Plan Active Problems:   Acute respiratory failure (HCC)   Acute CHF (congestive heart failure) (HCC)   Hypertensive urgency   Type 2 diabetes mellitus with renal manifestations (HCC)   CKD (chronic kidney disease) stage 3, GFR 30-59 ml/min    1. Acute respiratory failure with hypoxia most likely from acute systolic heart failure - patient has had a stress test in October 2017 which showed EF of 49% with no reversible ischemia. At this time I have continued patient on Lasix 40 mg IV every 12 and have also ordered additional dose of 20 mg IV. Continue on BiPAP and slowly wean off. I have also ordered nitroglycerin patch. Cycle cardiac markers check 2-D echo. 2. Hypertensive emergency - patient has been placed on nitroglycerin patch, continue home dose of clonidine triamterene and amlodipine and hold off Cozaar due to worsening renal function. 3. Acute on chronic renal failure - holding off Cozaar due to worsening renal function and follow metabolic panel closely. Check UA and FENa. 4. Diabetes mellitus type 2 - patient will be continued on Amaryl. Hold metformin. Sliding scale coverage. 5. History of prostate cancer status post radiation therapy.   DVT prophylaxis: Lovenox. Code Status: Full code.  Family Communication: Discussed with patient's wife.  Disposition Plan: Home.  Consults called: None.  Admission status: Inpatient.    Rise Patience MD [facility] Pager [phone].  If 7PM-7AM, please contact night-coverage [email or web address] Password [email or web address]  12/05/2016, 9:55 PM

## 2016-12-05 NOTE — Progress Notes (Signed)
   12/05/16 1925  BiPAP/CPAP/SIPAP  BiPAP/CPAP/SIPAP Pt Type Adult  Mask Type Full face mask  Mask Size Large  Set Rate 10 breaths/min  Respiratory Rate 26 breaths/min  IPAP 16 cmH20  EPAP 6 cmH2O  Oxygen Percent 60 %  Flow Rate 0 lpm  Minute Ventilation 20.1  Leak 32  Peak Inspiratory Pressure (PIP) 17  Tidal Volume (Vt) 795  BiPAP/CPAP/SIPAP BiPAP  Patient Home Equipment No  Auto Titrate No  Press High Alarm 25 cmH2O  Press Low Alarm 5 cmH2O  BiPAP/CPAP /SiPAP Vitals  Pulse Rate 94  SpO2 100 %  Patient arrived to ED via EMS on cpap for SOB and increase WOB. After taking patient off the EMS cpap to take a brief history his sats dropped to 66% and he was gasping for air. Patient was immediately placed on the V 60 Bipap by this RT on the above settings. His sats improved to 100% and his WOB improved.

## 2016-12-05 NOTE — ED Provider Notes (Signed)
[facility] DEPT Provider Note   CSN: [id number] Arrival date & time: 12/05/16  1920     History   Chief Complaint Chief Complaint  Patient presents with  . Shortness of Breath    HPI Michael Blackwell is a 76 y.o. male. Patient has history of diabetes, hyperlipidemia, hypertension, and sleep apnea who presents with shortness of breath.  Initial history obtained from EMS.  EMS reports that they were called out for acute shortness of breath and upon their arrival, patient was satting 70% on room air.  He had diffuse crackles.  He is placed on CPAP and improved to mid 90s.  He had mild tachycardia and hypertension 220/120.  Patient was taken off of CPAP briefly here.  He reports that he has had upper respiratory symptoms for the last 2 days.  They became acutely worse today.  Within 2 minutes of being off of CPAP, he began to desat had increased work of breathing.  BiPAP re-initiated. Remainder of history is limited.  Patient denies any chest pain at this time.  No history of coronary artery disease or congestive heart failure.  Patient does endorse that he has had increased lower extremity edema over the last few months.  HPI  Past Medical History:  Diagnosis Date  . Arthritis   . Diabetes mellitus without complication ([place])   . History of radiation therapy 02/25/14- 04/23/14   prostate 7800 cGy 40 sessions, seminal vesicles 5600 cGy 40 sessions  . Hypercholesteremia   . Hypertension   . Prostate cancer ([place]) 11/19/13   gleason 4+3=7, volume 34.74 cc  . Sleep apnea   . Spermatocele    right    Patient Active Problem List   Diagnosis Date Noted  . Acute respiratory failure ([place]) 12/05/2016  . Malignant neoplasm of prostate ([place]) 01/12/2014    Past Surgical History:  Procedure Laterality Date  . PROSTATE BIOPSY  11/19/13   Gleason 4+3=7, vol 34.74 cc  . TONSILLECTOMY     as child       Home Medications    Prior to Admission medications   Medication Sig Start Date End  Date Taking? Authorizing Provider  amLODipine (NORVASC) 5 MG tablet Take 5 mg by mouth daily.    Historical Provider, MD  aspirin 81 MG tablet Take 81 mg by mouth daily.    Historical Provider, MD  atorvastatin (LIPITOR) 20 MG tablet Take 20 mg by mouth daily.    Historical Provider, MD  B Complex-C (SUPER B COMPLEX/VITAMIN C) TABS Take 1 tablet by mouth daily.    Historical Provider, MD  Calcium-Magnesium-Zinc 500-250-12.5 MG TABS Take 2 tablets by mouth daily.    Historical Provider, MD  cloNIDine (CATAPRES) 0.1 MG tablet Take 1 tablet by mouth 2 (two) times daily.  12/12/15   Historical Provider, MD  gemfibrozil (LOPID) 600 MG tablet Take 600 mg by mouth 2 (two) times daily before a meal.    Historical Provider, MD  glimepiride (AMARYL) 4 MG tablet Take 4 mg by mouth daily with breakfast.    Historical Provider, MD  Glucosamine-Chondroit-Vit C-Mn (GLUCOSAMINE 1500 COMPLEX) CAPS Take 1 capsule by mouth 2 (two) times daily.    Historical Provider, MD  losartan (COZAAR) 100 MG tablet Take 100 mg by mouth daily. 01/25/16   Historical Provider, MD  metFORMIN (GLUCOPHAGE) 500 MG tablet Take 500-1,000 mg by mouth See admin instructions. TAKES 2 TABS IN AM AND 1 TAB IN PM    Historical Provider, MD  Omega-3 Fatty  Acids (FISH OIL) 1000 MG CAPS Take 1,000 mg by mouth daily.    Historical Provider, MD  ranitidine (ZANTAC) 150 MG capsule Take 150 mg by mouth every morning.     Historical Provider, MD  tamsulosin (FLOMAX) 0.4 MG CAPS capsule Take 1 capsule (0.4 mg total) by mouth daily. 04/12/14   [name], MD    Family History Family History  Problem Relation Age of Onset  . Cancer Brother     brain  . Cancer Mother     stomach  . Cancer Father     ? prostate    Social History Social History  Substance Use Topics  . Smoking status: Former Smoker    Packs/day: 1.00    Years: 30.00    Types: Cigarettes    Quit date: 09/30/2013  . Smokeless tobacco: Not on file  . Alcohol use Yes      Comment: 2 in 2 weeks     Allergies   Patient has no known allergies.   Review of Systems Review of Systems  Constitutional: Negative for chills and fever.  HENT: Positive for congestion and rhinorrhea. Negative for ear pain and sore throat.   Eyes: Negative for pain and visual disturbance.  Respiratory: Positive for shortness of breath. Negative for cough.   Cardiovascular: Positive for leg swelling. Negative for chest pain and palpitations.  Gastrointestinal: Negative for abdominal pain and vomiting.  Genitourinary: Negative for dysuria and hematuria.  Musculoskeletal: Negative for arthralgias and back pain.  Skin: Negative for color change and rash.  Neurological: Negative for seizures and syncope.  All other systems reviewed and are negative.    Physical Exam Updated Vital Signs BP (!) 201/89   Pulse 92   Temp 97.8 F (36.6 C) (Oral)   Resp (!) 27   Ht 5\' 10"  (1.778 m)   Wt 102.1 kg   SpO2 100%   BMI 32.28 kg/m   Physical Exam  Constitutional: He is oriented to person, place, and time. He appears well-developed and well-nourished.  HENT:  Head: Normocephalic and atraumatic.  Eyes: Conjunctivae and EOM are normal.  Neck: Normal range of motion. Neck supple.  Cardiovascular: Regular rhythm and intact distal pulses.   No murmur heard. Tachycardic  Pulmonary/Chest: He is in respiratory distress. He has no wheezes. He has rales.  Increased WOB. Bibasilar crackles.  Abdominal: Soft. He exhibits no distension and no mass. There is no tenderness. There is no guarding.  Musculoskeletal: He exhibits edema. He exhibits no tenderness or deformity.  2+ pitting edema to b/l Le. R > L.  Neurological: He is alert and oriented to person, place, and time.  Moving all extremities symmetrically  Skin: Skin is warm and dry.  Psychiatric: He has a normal mood and affect.  Nursing note and vitals reviewed.    ED Treatments / Results  Labs (all labs ordered are listed, but  only abnormal results are displayed) Labs Reviewed  COMPREHENSIVE METABOLIC PANEL - Abnormal; Notable for the following:       Result Value   Chloride 112 (*)    CO2 21 (*)    Glucose, Bld 267 (*)    Creatinine, Ser 2.26 (*)    Total Protein 6.2 (*)    Albumin 2.3 (*)    Total Bilirubin 0.1 (*)    GFR calc non Af Amer 27 (*)    GFR calc Af Amer 31 (*)    All other components within normal limits  BRAIN NATRIURETIC PEPTIDE -  Abnormal; Notable for the following:    B Natriuretic Peptide 865.2 (*)    All other components within normal limits  CBC WITH DIFFERENTIAL/PLATELET  I-STAT TROPOININ, ED  I-STAT CG4 LACTIC ACID, ED  Randolm Idol, ED    EKG  EKG Interpretation  Date/Time:  Wednesday December 05 2016 20:11:15 EST Ventricular Rate:  91 PR Interval:    QRS Duration: 81 QT Interval:  384 QTC Calculation: 473 R Axis:   43 Text Interpretation:  Sinus rhythm Multiple ventricular premature complexes Probable left atrial enlargement Nonspecific T abnormalities, lateral leads Confirmed by Gilford Raid MD, JULIE ([id number]) on 12/05/2016 8:24:54 PM       Radiology Dg Chest Portable 1 View  Result Date: 12/05/2016 CLINICAL DATA:  Respiratory distress EXAM: PORTABLE CHEST 1 VIEW COMPARISON:  11/22/2016 FINDINGS: Suspect that there are small pleural effusions. Ground-glass densities are present within the left greater than right upper lobe and the left lower lung, suspicious for edema. There is mild cardiomegaly. No pneumothorax. Cannot exclude in infiltrate at the left lung base. IMPRESSION: 1. Cardiomegaly with suspected small effusions 2. Bilateral left greater than right ground-glass densities suggestive of edema 3. Cannot exclude atelectasis or pneumonia at the left lung base. Electronically Signed   By: Donavan Foil M.D.   On: 12/05/2016 19:59    Procedures Procedures (including critical care time)  Medications Ordered in ED Medications  furosemide (LASIX) injection 40 mg (40 mg  Intravenous Given 12/05/16 2037)     Initial Impression / Assessment and Plan / ED Course  I have reviewed the triage vital signs and the nursing notes.  Pertinent labs & imaging results that were available during my care of the patient were reviewed by me and considered in my medical decision making (see chart for details).    Patient with history above presents with acute shortness of breath.  See history of present illness for full details.  He had 2 days of preceding upper respiratory illness.  He's been having several months of increased lower extremity edema.  No history of coronary artery disease or CHF.  He was placed on CPAP with EMS and had marked improvement.  Upon arrival here, his work of breathing had improved, however rapidly decompensated when he was taken off of CPAP.  BiPAP was reinitiated here.  Chest x-ray shows pulmonary edema.  BNP elevated.  Initial troponin negative.  Suspect volume overload causing his symptoms today.  He was given 40 mg of IV Lasix in the ED.  I spoke to the hospitalist who will admit the patient.  His work of breathing remains improved while on BiPAP.  He is oxygenating well.  Final Clinical Impressions(s) / ED Diagnoses   Final diagnoses:  SOB (shortness of breath)    New Prescriptions New Prescriptions   No medications on file     Clifton James, MD 12/05/16 2117    Isla Pence, MD 12/05/16 2206

## 2016-12-06 ENCOUNTER — Inpatient Hospital Stay (HOSPITAL_COMMUNITY): Payer: Medicare Other

## 2016-12-06 ENCOUNTER — Encounter (HOSPITAL_COMMUNITY): Payer: Self-pay | Admitting: General Practice

## 2016-12-06 DIAGNOSIS — J9601 Acute respiratory failure with hypoxia: Secondary | ICD-10-CM

## 2016-12-06 DIAGNOSIS — I160 Hypertensive urgency: Secondary | ICD-10-CM

## 2016-12-06 DIAGNOSIS — E1121 Type 2 diabetes mellitus with diabetic nephropathy: Secondary | ICD-10-CM

## 2016-12-06 DIAGNOSIS — E876 Hypokalemia: Secondary | ICD-10-CM

## 2016-12-06 DIAGNOSIS — I509 Heart failure, unspecified: Secondary | ICD-10-CM

## 2016-12-06 DIAGNOSIS — I5023 Acute on chronic systolic (congestive) heart failure: Secondary | ICD-10-CM

## 2016-12-06 DIAGNOSIS — J189 Pneumonia, unspecified organism: Secondary | ICD-10-CM

## 2016-12-06 HISTORY — DX: Pneumonia, unspecified organism: J18.9

## 2016-12-06 LAB — COMPREHENSIVE METABOLIC PANEL
ALBUMIN: 2.1 g/dL — AB (ref 3.5–5.0)
ALK PHOS: 48 U/L (ref 38–126)
ALT: 19 U/L (ref 17–63)
AST: 22 U/L (ref 15–41)
Anion gap: 9 (ref 5–15)
BILIRUBIN TOTAL: 0.4 mg/dL (ref 0.3–1.2)
BUN: 13 mg/dL (ref 6–20)
CALCIUM: 8.6 mg/dL — AB (ref 8.9–10.3)
CO2: 20 mmol/L — ABNORMAL LOW (ref 22–32)
CREATININE: 2.3 mg/dL — AB (ref 0.61–1.24)
Chloride: 114 mmol/L — ABNORMAL HIGH (ref 101–111)
GFR calc Af Amer: 31 mL/min — ABNORMAL LOW (ref >60)
GFR calc non Af Amer: 27 mL/min — ABNORMAL LOW (ref >60)
Glucose, Bld: 116 mg/dL — ABNORMAL HIGH (ref 65–99)
Potassium: 3.1 mmol/L — ABNORMAL LOW (ref 3.5–5.1)
Sodium: 143 mmol/L (ref 135–145)
TOTAL PROTEIN: 5.4 g/dL — AB (ref 6.5–8.1)

## 2016-12-06 LAB — TROPONIN I
Troponin I: 0.13 ng/mL (ref <0.03)
Troponin I: 0.13 ng/mL (ref <0.03)
Troponin I: 0.15 ng/mL (ref <0.03)

## 2016-12-06 LAB — CBC WITH DIFFERENTIAL/PLATELET
BASOS PCT: 0 %
Basophils Absolute: 0 10*3/uL (ref 0.0–0.1)
Eosinophils Absolute: 0.1 10*3/uL (ref 0.0–0.7)
Eosinophils Relative: 1 %
HEMATOCRIT: 30.5 % — AB (ref 39.0–52.0)
HEMOGLOBIN: 9.9 g/dL — AB (ref 13.0–17.0)
LYMPHS ABS: 1.2 10*3/uL (ref 0.7–4.0)
Lymphocytes Relative: 15 %
MCH: 26.9 pg (ref 26.0–34.0)
MCHC: 32.5 g/dL (ref 30.0–36.0)
MCV: 82.9 fL (ref 78.0–100.0)
MONOS PCT: 9 %
Monocytes Absolute: 0.7 10*3/uL (ref 0.1–1.0)
NEUTROS ABS: 5.8 10*3/uL (ref 1.7–7.7)
NEUTROS PCT: 75 %
Platelets: 270 10*3/uL (ref 150–400)
RBC: 3.68 MIL/uL — ABNORMAL LOW (ref 4.22–5.81)
RDW: 17.7 % — ABNORMAL HIGH (ref 11.5–15.5)
WBC: 7.9 10*3/uL (ref 4.0–10.5)

## 2016-12-06 LAB — CBC
HEMATOCRIT: 31.6 % — AB (ref 39.0–52.0)
HEMOGLOBIN: 10.2 g/dL — AB (ref 13.0–17.0)
MCH: 26.8 pg (ref 26.0–34.0)
MCHC: 32.3 g/dL (ref 30.0–36.0)
MCV: 83.2 fL (ref 78.0–100.0)
Platelets: 267 10*3/uL (ref 150–400)
RBC: 3.8 MIL/uL — ABNORMAL LOW (ref 4.22–5.81)
RDW: 17.5 % — ABNORMAL HIGH (ref 11.5–15.5)
WBC: 9.9 10*3/uL (ref 4.0–10.5)

## 2016-12-06 LAB — TSH: TSH: 1.333 u[IU]/mL (ref 0.350–4.500)

## 2016-12-06 LAB — CREATININE, SERUM
Creatinine, Ser: 2.26 mg/dL — ABNORMAL HIGH (ref 0.61–1.24)
GFR calc Af Amer: 31 mL/min — ABNORMAL LOW (ref >60)
GFR calc non Af Amer: 27 mL/min — ABNORMAL LOW (ref >60)

## 2016-12-06 LAB — I-STAT CHEM 8, ED
BUN: 15 mg/dL (ref 6–20)
CHLORIDE: 109 mmol/L (ref 101–111)
CREATININE: 2.3 mg/dL — AB (ref 0.61–1.24)
Calcium, Ion: 1.16 mmol/L (ref 1.15–1.40)
Glucose, Bld: 178 mg/dL — ABNORMAL HIGH (ref 65–99)
HEMATOCRIT: 30 % — AB (ref 39.0–52.0)
Hemoglobin: 10.2 g/dL — ABNORMAL LOW (ref 13.0–17.0)
POTASSIUM: 3.6 mmol/L (ref 3.5–5.1)
Sodium: 145 mmol/L (ref 135–145)
TCO2: 22 mmol/L (ref 0–100)

## 2016-12-06 LAB — GLUCOSE, CAPILLARY
GLUCOSE-CAPILLARY: 112 mg/dL — AB (ref 65–99)
Glucose-Capillary: 194 mg/dL — ABNORMAL HIGH (ref 65–99)
Glucose-Capillary: 113 mg/dL — ABNORMAL HIGH (ref 65–99)

## 2016-12-06 LAB — ECHOCARDIOGRAM COMPLETE
HEIGHTINCHES: 69 in
Weight: 3582.4 oz

## 2016-12-06 MED ORDER — HYDRALAZINE HCL 50 MG PO TABS
50.0000 mg | ORAL_TABLET | Freq: Three times a day (TID) | ORAL | Status: DC
  Administered 2016-12-06: 50 mg via ORAL
  Filled 2016-12-06: qty 1

## 2016-12-06 MED ORDER — HYDRALAZINE HCL 50 MG PO TABS
100.0000 mg | ORAL_TABLET | Freq: Three times a day (TID) | ORAL | Status: DC
  Administered 2016-12-06 – 2016-12-10 (×11): 100 mg via ORAL
  Filled 2016-12-06 (×12): qty 2

## 2016-12-06 MED ORDER — POTASSIUM CHLORIDE CRYS ER 20 MEQ PO TBCR
40.0000 meq | EXTENDED_RELEASE_TABLET | Freq: Once | ORAL | Status: AC
  Administered 2016-12-06: 40 meq via ORAL
  Filled 2016-12-06: qty 2

## 2016-12-06 MED ORDER — GUAIFENESIN ER 600 MG PO TB12
600.0000 mg | ORAL_TABLET | Freq: Two times a day (BID) | ORAL | Status: DC
  Administered 2016-12-06 – 2016-12-10 (×9): 600 mg via ORAL
  Filled 2016-12-06 (×9): qty 1

## 2016-12-06 MED ORDER — AMLODIPINE BESYLATE 10 MG PO TABS
10.0000 mg | ORAL_TABLET | Freq: Every day | ORAL | Status: DC
  Administered 2016-12-07 – 2016-12-10 (×4): 10 mg via ORAL
  Filled 2016-12-06 (×4): qty 1

## 2016-12-06 NOTE — Progress Notes (Signed)
Inpatient Diabetes Program Recommendations  AACE/ADA: New Consensus Statement on Inpatient Glycemic Control (2015)  Target Ranges:  Prepandial:   less than 140 mg/dL      Peak postprandial:   less than 180 mg/dL (1-2 hours)      Critically ill patients:  140 - 180 mg/dL   No results found for: GLUCAP, HGBA1C  Review of Glycemic Control  Diabetes history: DM2 Outpatient Diabetes medications: Amaryl 4 mg qd + Metformin 500 mg bid Current orders for Inpatient glycemic control: Amaryl 4 mg + Novolog correction 0-9 units tid  Inpatient Diabetes Program Recommendations:  Please consider A1c to determine prehospital glycemic control.  Thank you, Michael Blackwell. Torri Michalski, RN, MSN, CDE Inpatient Glycemic Control Team Team Pager [phone] (8am-5pm) 12/06/2016 9:20 AM

## 2016-12-06 NOTE — ED Notes (Signed)
Pt has been off bipap since midnight and tolerating well

## 2016-12-06 NOTE — Progress Notes (Signed)
   12/05/16 2355  Oxygen Therapy/Pulse Ox  O2 Device Nasal Cannula  O2 Therapy Oxygen  O2 Flow Rate (L/min) 3 L/min  SpO2 92 %  Patient is off the Bipap and placed on 3L nasal cannula. He tolerates it very well at this time. He is advised to call respiratory if he gets into respiratory distress.

## 2016-12-06 NOTE — Progress Notes (Signed)
PROGRESS NOTE                                                                                                                                                                                                             Patient Demographics:    Michael Blackwell, is a 76 y.o. male, DOB - 12/05/1940, KDT:[id number]  Admit date - 12/05/2016   Admitting Physician Rise Patience, MD  Outpatient Primary MD for the patient is [name], MD  LOS - 1  Outpatient Specialists: none  Chief Complaint  Patient presents with  . Shortness of Breath       Brief Narrative   76 y.o. male with history of hypertension, diabetes mellitus type 2, history of prostate cancer status post radiation treatment, hyperlipidemia presented to the ER because of shortness of breath. Patient started developing worsening shortness of breath last evening. Denies any chest pain pleuritic cough or fever or chills. Patient did have chest pain 3 weeks ago and had come to the ER at that time workup was negative.  Patient Found to have bilateral pleural effusion on chest x-ray with elevated BNP and required BiPAP in the ED. Also found to have hypertensive emergency. Admitted to telemetry. And acute on chronic kidney disease stage III.   Subjective:   Reports breathing to be better since admission (has put out almost 2 L)    Assessment  & Plan :   Principal problem Acute respiratory failure with hypoxia (HCC) Likely secondary to acute systolic CHF. Continue IV Lasix 40 mg every 12 hours. Strict I/O and daily weight. Currently stable on 2 L via nasal cannula. On nitroglycerin patch. Troponin peaked at 0.15. No chest pain symptoms. (Likely demand ischemia). Replenish low potassium. Check 2-D echo. Patient has stress test in October 2017 negative for ischemia but showed an EF of 49%.  Hypertensive emergency Placed a nitroglycerin patch on admission.  Continue home dose clonidine. Hold HCTZ - triamterene and ARB given acute on chronic kidney disease. Continue amlodipine (have increased dose to 10 mg daily).  Added hydralazine 50 mg 3 times a day. Counseled on strict diet adherence. Needs continuous education on symptom management, medication adherence and outpt follow up.   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 baseline creatinine around 1.5. Hold HCTZ-triamterene and ARB. Monitor with diuresis.  Type 2 diabetes mellitus  Metformin. Monitor on sliding scale coverage. Check A1c.  Hypokalemia. Replenish  History of prostate cancer status post radiation   Code Status : Full code  Family Communication  : None at bedside  Disposition Plan  : Home once improved (possibly in the next 72 hours)  Barriers For Discharge : Active symptoms  Consults  :  None  Procedures  : 2-D echo (pending)  DVT Prophylaxis  :  Lovenox -   Lab Results  Component Value Date   PLT 270 12/06/2016    Antibiotics  :    Anti-infectives    None        Objective:   Vitals:   12/06/16 0530 12/06/16 0635 12/06/16 0640 12/06/16 0954  BP: 172/99  (!) 186/98 (!) 194/87  Pulse: 99     Resp: (!) 27     Temp:      TempSrc:      SpO2: 96%     Weight:  101.6 kg (223 lb 14.4 oz)    Height:  5\' 9"  (1.753 m)      Wt Readings from Last 3 Encounters:  12/06/16 101.6 kg (223 lb 14.4 oz)  11/22/16 102.1 kg (225 lb)  03/01/16 105.1 kg (231 lb 9.6 oz)     Intake/Output Summary (Last 24 hours) at 12/06/16 1316 Last data filed at 12/06/16 0149  Gross per 24 hour  Intake                0 ml  Output             2800 ml  Net            -2800 ml     Physical Exam  Gen: not in distress HEENT:  moist mucosa, supple neck Chest: Diminished bibasilar breath sounds,  fine crackles CVS: N S1&S2, no murmurs GI: soft, NT, ND Musculoskeletal: warm, 1+ pitting edema bilaterally     Data Review:    CBC  Recent Labs Lab 12/06/16 0006  12/06/16 0020 12/06/16 0822  WBC 9.9  --  7.9  HGB 10.2* 10.2* 9.9*  HCT 31.6* 30.0* 30.5*  PLT 267  --  270  MCV 83.2  --  82.9  MCH 26.8  --  26.9  MCHC 32.3  --  32.5  RDW 17.5*  --  17.7*  LYMPHSABS  --   --  1.2  MONOABS  --   --  0.7  EOSABS  --   --  0.1  BASOSABS  --   --  0.0    Chemistries   Recent Labs Lab 12/05/16 1934 12/06/16 0006 12/06/16 0020 12/06/16 0822  NA 143  --  145 143  K 3.5  --  3.6 3.1*  CL 112*  --  109 114*  CO2 21*  --   --  20*  GLUCOSE 267*  --  178* 116*  BUN 14  --  15 13  CREATININE 2.26* 2.26* 2.30* 2.30*  CALCIUM 8.9  --   --  8.6*  AST 30  --   --  22  ALT 23  --   --  19  ALKPHOS 55  --   --  48  BILITOT 0.1*  --   --  0.4   ------------------------------------------------------------------------------------------------------------------ No results for input(s): CHOL, HDL, LDLCALC, TRIG, CHOLHDL, LDLDIRECT in the last 72 hours.  No results found for: HGBA1C ------------------------------------------------------------------------------------------------------------------  Recent Labs  12/06/16 0006  TSH 1.333   ------------------------------------------------------------------------------------------------------------------ No results for input(s):  VITAMINB12, FOLATE, FERRITIN, TIBC, IRON, RETICCTPCT in the last 72 hours.  Coagulation profile No results for input(s): INR, PROTIME in the last 168 hours.  No results for input(s): DDIMER in the last 72 hours.  Cardiac Enzymes  Recent Labs Lab 12/06/16 0006 12/06/16 0822  TROPONINI 0.13* 0.15*  0.13*   ------------------------------------------------------------------------------------------------------------------    Component Value Date/Time   BNP 865.2 (H) 12/05/2016 1934    Inpatient Medications  Scheduled Meds: . [START ON 12/07/2016] amLODipine  10 mg Oral Daily  . aspirin  81 mg Oral Daily  . atorvastatin  20 mg Oral q1800  . calcium carbonate  1  tablet Oral Q breakfast   And  . magnesium oxide  200 mg Oral Q breakfast  . cloNIDine  0.1 mg Oral BID  . enoxaparin (LOVENOX) injection  40 mg Subcutaneous Q24H  . famotidine  20 mg Oral BID  . furosemide  40 mg Intravenous Q12H  . gemfibrozil  600 mg Oral BID AC  . glimepiride  4 mg Oral Q breakfast  . hydrALAZINE  50 mg Oral Q8H  . insulin aspart  0-9 Units Subcutaneous TID WC  . multivitamin with minerals  1 tablet Oral Daily  . nitroGLYCERIN  0.4 mg Transdermal Daily  . omega-3 acid ethyl esters  1 g Oral Daily  . potassium chloride  10 mEq Oral Daily  . tamsulosin  0.4 mg Oral Daily   Continuous Infusions: PRN Meds:.acetaminophen **OR** acetaminophen, hydrALAZINE  Micro Results No results found for this or any previous visit (from the past 240 hour(s)).  Radiology Reports Dg Chest 2 View  Result Date: 11/22/2016 CLINICAL DATA:  Chest pain EXAM: CHEST  2 VIEW COMPARISON:  02/06/2013 FINDINGS: Cardiac enlargement without heart failure. Lungs are clear without infiltrate effusion or mass. No adenopathy. IMPRESSION: No active cardiopulmonary disease. Electronically Signed   By: Franchot Gallo M.D.   On: 11/22/2016 14:16   Dg Chest Portable 1 View  Result Date: 12/05/2016 CLINICAL DATA:  Respiratory distress EXAM: PORTABLE CHEST 1 VIEW COMPARISON:  11/22/2016 FINDINGS: Suspect that there are small pleural effusions. Ground-glass densities are present within the left greater than right upper lobe and the left lower lung, suspicious for edema. There is mild cardiomegaly. No pneumothorax. Cannot exclude in infiltrate at the left lung base. IMPRESSION: 1. Cardiomegaly with suspected small effusions 2. Bilateral left greater than right ground-glass densities suggestive of edema 3. Cannot exclude atelectasis or pneumonia at the left lung base. Electronically Signed   By: Donavan Foil M.D.   On: 12/05/2016 19:59    Time Spent in minutes  35   Louellen Molder M.D on 12/06/2016 at 1:16  PM  Between 7am to 7pm - Pager - [phone]  After 7pm go to [email or web address] - password [email or web address]  [facility] -  Office  [phone]

## 2016-12-06 NOTE — Progress Notes (Signed)
   12/06/16 0226  BiPAP/CPAP/SIPAP  BiPAP/CPAP/SIPAP Pt Type Adult  Mask Type Full face mask  Mask Size Large  Set Rate 10 breaths/min  Respiratory Rate 23 breaths/min  IPAP 16 cmH20  EPAP 6 cmH2O  Oxygen Percent 40 %  Flow Rate 0 lpm  Minute Ventilation 19.4  Peak Inspiratory Pressure (PIP) 17  Tidal Volume (Vt) 895  BiPAP/CPAP/SIPAP BiPAP  Patient Home Equipment No  Auto Titrate No  BiPAP/CPAP /SiPAP Vitals  Pulse Rate 93  SpO2 99 %  Patient requested to go back on Bipap.

## 2016-12-06 NOTE — Progress Notes (Signed)
Patient placed on CPAP for the night without complication. RT will continue to monitor as needed. 

## 2016-12-07 ENCOUNTER — Inpatient Hospital Stay (HOSPITAL_COMMUNITY): Payer: Medicare Other

## 2016-12-07 DIAGNOSIS — N178 Other acute kidney failure: Secondary | ICD-10-CM

## 2016-12-07 DIAGNOSIS — N183 Chronic kidney disease, stage 3 (moderate): Secondary | ICD-10-CM

## 2016-12-07 DIAGNOSIS — I10 Essential (primary) hypertension: Secondary | ICD-10-CM

## 2016-12-07 DIAGNOSIS — I5041 Acute combined systolic (congestive) and diastolic (congestive) heart failure: Secondary | ICD-10-CM

## 2016-12-07 LAB — GLUCOSE, CAPILLARY
GLUCOSE-CAPILLARY: 138 mg/dL — AB (ref 65–99)
Glucose-Capillary: 80 mg/dL (ref 65–99)
Glucose-Capillary: 75 mg/dL (ref 65–99)
Glucose-Capillary: 146 mg/dL — ABNORMAL HIGH (ref 65–99)

## 2016-12-07 LAB — BASIC METABOLIC PANEL
Anion gap: 9 (ref 5–15)
BUN: 14 mg/dL (ref 6–20)
CHLORIDE: 110 mmol/L (ref 101–111)
CO2: 22 mmol/L (ref 22–32)
Calcium: 8.9 mg/dL (ref 8.9–10.3)
Creatinine, Ser: 2.46 mg/dL — ABNORMAL HIGH (ref 0.61–1.24)
GFR calc non Af Amer: 24 mL/min — ABNORMAL LOW (ref >60)
GFR, EST AFRICAN AMERICAN: 28 mL/min — AB (ref >60)
Glucose, Bld: 78 mg/dL (ref 65–99)
POTASSIUM: 2.9 mmol/L — AB (ref 3.5–5.1)
SODIUM: 141 mmol/L (ref 135–145)

## 2016-12-07 LAB — MAGNESIUM: MAGNESIUM: 2.1 mg/dL (ref 1.7–2.4)

## 2016-12-07 MED ORDER — POTASSIUM CHLORIDE CRYS ER 20 MEQ PO TBCR: 40.0000 meq | EXTENDED_RELEASE_TABLET | Freq: Once | ORAL | Status: DC

## 2016-12-07 MED ORDER — POTASSIUM CHLORIDE CRYS ER 20 MEQ PO TBCR: 20.0000 meq | EXTENDED_RELEASE_TABLET | Freq: Every day | ORAL | Status: DC

## 2016-12-07 MED ORDER — POTASSIUM CHLORIDE CRYS ER 20 MEQ PO TBCR
40.0000 meq | EXTENDED_RELEASE_TABLET | Freq: Once | ORAL | Status: AC
  Administered 2016-12-07: 40 meq via ORAL
  Filled 2016-12-07: qty 2

## 2016-12-07 MED ORDER — ISOSORBIDE MONONITRATE ER 30 MG PO TB24
30.0000 mg | ORAL_TABLET | Freq: Every day | ORAL | Status: DC
  Administered 2016-12-07 – 2016-12-10 (×4): 30 mg via ORAL
  Filled 2016-12-07 (×4): qty 1

## 2016-12-07 MED ORDER — POTASSIUM CHLORIDE CRYS ER 20 MEQ PO TBCR
40.0000 meq | EXTENDED_RELEASE_TABLET | Freq: Every day | ORAL | Status: DC
  Administered 2016-12-07: 40 meq via ORAL
  Filled 2016-12-07: qty 2

## 2016-12-07 NOTE — Progress Notes (Signed)
Pt weaned to room air without difficulty or complication.  Will con't plan of care.

## 2016-12-07 NOTE — Progress Notes (Signed)
PROGRESS NOTE                                                                                                                                                                                                             Patient Demographics:    Michael Blackwell, is a 76 y.o. male, DOB - [birth year], BWG:[id number]  Admit date - 12/05/2016   Admitting Physician Rise Patience, MD  Outpatient Primary MD for the patient is [name], MD  LOS - 2  Outpatient Specialists: none  Chief Complaint  Patient presents with  . Shortness of Breath       Brief Narrative   76 y.o. male with history of hypertension, diabetes mellitus type 2, history of prostate cancer status post radiation treatment, hyperlipidemia presented to the ER because of shortness of breath. Patient started developing worsening shortness of breath last evening. Denies any chest pain pleuritic cough or fever or chills. Patient did have chest pain 3 weeks ago and had come to the ER at that time workup was negative.  Patient Found to have bilateral pleural effusion on chest x-ray with elevated BNP and required BiPAP in the ED. Also found to have hypertensive emergency. Admitted to telemetry. And acute on chronic kidney disease stage III.   Subjective:   Dyspnea continues to improve.    Assessment  & Plan :   Principal problem Acute respiratory failure with hypoxia (HCC) Likely secondary to acute systolic CHF. Continue IV Lasix 40 mg every 12 hours. Strict I/O and daily weight. (output has not been charted well ). Currently stable on 2 L via nasal cannula. -Troponin peaked at 0.15. No chest pain symptoms. (Likely demand ischemia). Replenish low potassium. Repeat 2-D echo was LVH with systolic function of 17-79% with hypokinesis of inferior myocardium and grade 1 diastolic dysfunction.  Patient has stress test in October 2017 negative for ischemia but showed  an EF of 49%. -Patient does not have a cardiologist. Will establish care with outpatient cardiology.  Hypertensive emergency Placed a nitroglycerin patch on admission. Continue home dose clonidine. Hold HCTZ - triamterene and ARB given acute on chronic kidney disease. Increased dose of amlodipine to 10 mg daily and added hydralazine and Imdur. Add Beta blocker upon discharge. Counseled on strict diet adherence. Needs continuous education on symptom management, medication adherence and outpt follow up.  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 ? Hypertensive nephropathy. baseline creatinine around 1.5. Hold HCTZ-triamterene and ARB. Monitor with diuresis. Check renal ultrasound.  Type 2 diabetes mellitus Holding metformin.. Monitor on sliding scale coverage. Check A1c.  Hypokalemia. Replenished. Normal magnesium.  Microcytic anemia Check iron panel.  History of prostate cancer status post radiation   Code Status : Full code  Family Communication  : None at bedside  Disposition Plan  : Home once improved (possibly in the next 48 hours)  Barriers For Discharge : Active symptoms  Consults  :  None  Procedures  : 2-D echo  DVT Prophylaxis  :  Lovenox -   Lab Results  Component Value Date   PLT 270 12/06/2016    Antibiotics  :    Anti-infectives    None        Objective:   Vitals:   12/06/16 2309 12/07/16 0336 12/07/16 0500 12/07/16 0606  BP:  (!) 190/79  (!) 172/84  Pulse:  86    Resp: 18 18    Temp:  98.1 F (36.7 C)    TempSrc:  Oral    SpO2:  100%    Weight:   100.2 kg (220 lb 14.4 oz)   Height:        Wt Readings from Last 3 Encounters:  12/07/16 100.2 kg (220 lb 14.4 oz)  11/22/16 102.1 kg (225 lb)  03/01/16 105.1 kg (231 lb 9.6 oz)     Intake/Output Summary (Last 24 hours) at 12/07/16 1048 Last data filed at 12/07/16 0300  Gross per 24 hour  Intake                0 ml  Output              900 ml  Net             -900 ml      Physical Exam  Gen: not in distress HEENT:  moist mucosa, supple neck Chest: Diminished bibasilar breath sounds,   CVS: N S1&S2, no murmurs GI: soft, NT, ND Musculoskeletal: warm, 1+ pitting edema bilaterally     Data Review:    CBC  Recent Labs Lab 12/06/16 0006 12/06/16 0020 12/06/16 0822  WBC 9.9  --  7.9  HGB 10.2* 10.2* 9.9*  HCT 31.6* 30.0* 30.5*  PLT 267  --  270  MCV 83.2  --  82.9  MCH 26.8  --  26.9  MCHC 32.3  --  32.5  RDW 17.5*  --  17.7*  LYMPHSABS  --   --  1.2  MONOABS  --   --  0.7  EOSABS  --   --  0.1  BASOSABS  --   --  0.0    Chemistries   Recent Labs Lab 12/05/16 1934 12/06/16 0006 12/06/16 0020 12/06/16 0822 12/07/16 0321  NA 143  --  145 143 141  K 3.5  --  3.6 3.1* 2.9*  CL 112*  --  109 114* 110  CO2 21*  --   --  20* 22  GLUCOSE 267*  --  178* 116* 78  BUN 14  --  15 13 14   CREATININE 2.26* 2.26* 2.30* 2.30* 2.46*  CALCIUM 8.9  --   --  8.6* 8.9  MG  --   --   --   --  2.1  AST 30  --   --  22  --   ALT 23  --   --  19  --   ALKPHOS 55  --   --  48  --   BILITOT 0.1*  --   --  0.4  --    ------------------------------------------------------------------------------------------------------------------ No results for input(s): CHOL, HDL, LDLCALC, TRIG, CHOLHDL, LDLDIRECT in the last 72 hours.  No results found for: HGBA1C ------------------------------------------------------------------------------------------------------------------  Recent Labs  12/06/16 0006  TSH 1.333   ------------------------------------------------------------------------------------------------------------------ No results for input(s): VITAMINB12, FOLATE, FERRITIN, TIBC, IRON, RETICCTPCT in the last 72 hours.  Coagulation profile No results for input(s): INR, PROTIME in the last 168 hours.  No results for input(s): DDIMER in the last 72 hours.  Cardiac Enzymes  Recent Labs Lab 12/06/16 0006 12/06/16 0822  TROPONINI 0.13*  0.15*  0.13*   ------------------------------------------------------------------------------------------------------------------    Component Value Date/Time   BNP 865.2 (H) 12/05/2016 1934    Inpatient Medications  Scheduled Meds: . amLODipine  10 mg Oral Daily  . aspirin  81 mg Oral Daily  . atorvastatin  20 mg Oral q1800  . calcium carbonate  1 tablet Oral Q breakfast   And  . magnesium oxide  200 mg Oral Q breakfast  . cloNIDine  0.1 mg Oral BID  . enoxaparin (LOVENOX) injection  40 mg Subcutaneous Q24H  . famotidine  20 mg Oral BID  . furosemide  40 mg Intravenous Q12H  . gemfibrozil  600 mg Oral BID AC  . glimepiride  4 mg Oral Q breakfast  . guaiFENesin  600 mg Oral BID  . hydrALAZINE  100 mg Oral Q8H  . insulin aspart  0-9 Units Subcutaneous TID WC  . isosorbide mononitrate  30 mg Oral Daily  . multivitamin with minerals  1 tablet Oral Daily  . omega-3 acid ethyl esters  1 g Oral Daily  . potassium chloride  40 mEq Oral Daily  . tamsulosin  0.4 mg Oral Daily   Continuous Infusions: PRN Meds:.acetaminophen **OR** acetaminophen, hydrALAZINE  Micro Results No results found for this or any previous visit (from the past 240 hour(s)).  Radiology Reports Dg Chest 2 View  Result Date: 11/22/2016 CLINICAL DATA:  Chest pain EXAM: CHEST  2 VIEW COMPARISON:  02/06/2013 FINDINGS: Cardiac enlargement without heart failure. Lungs are clear without infiltrate effusion or mass. No adenopathy. IMPRESSION: No active cardiopulmonary disease. Electronically Signed   By: Franchot Gallo M.D.   On: 11/22/2016 14:16   US Renal  Result Date: 12/07/2016 CLINICAL DATA:  Acute renal failure EXAM: RENAL / URINARY TRACT ULTRASOUND COMPLETE COMPARISON:  March 16, 2005 CT scan FINDINGS: Right Kidney: Length: 12.5 cm.  Echogenic cortex with multiple cysts. Left Kidney: Length: 11.2 cm.  Echogenic cortex with multiple cysts. Bladder: Appears normal for degree of bladder distention. IMPRESSION: 1.  Bilateral increase cortical echogenicity suggests medical renal disease. Multiple bilateral renal cysts. 2. Bilateral pleural effusions. Electronically Signed   By: Dorise Bullion III M.D   On: 12/07/2016 10:44   Dg Chest Portable 1 View  Result Date: 12/05/2016 CLINICAL DATA:  Respiratory distress EXAM: PORTABLE CHEST 1 VIEW COMPARISON:  11/22/2016 FINDINGS: Suspect that there are small pleural effusions. Ground-glass densities are present within the left greater than right upper lobe and the left lower lung, suspicious for edema. There is mild cardiomegaly. No pneumothorax. Cannot exclude in infiltrate at the left lung base. IMPRESSION: 1. Cardiomegaly with suspected small effusions 2. Bilateral left greater than right ground-glass densities suggestive of edema 3. Cannot exclude atelectasis or pneumonia at the left lung base. Electronically Signed   By: Maudie Mercury  Francoise Ceo M.D.   On: 12/05/2016 19:59    Time Spent in minutes  35   Louellen Molder M.D on 12/07/2016 at 10:48 AM  Between 7am to 7pm - Pager - [phone]  After 7pm go to [email or web address] - password [email or web address]  [facility] -  Office  [phone]

## 2016-12-07 NOTE — Progress Notes (Signed)
Patient placed on CPAP for the night without complications. RT will continue to monitor as needed. 

## 2016-12-07 NOTE — Care Management Note (Signed)
Case Management Note  Patient Details  Name: Michael Blackwell MRN: [id number] Date of Birth: 04-Jul-1941  Subjective/Objective:                 Spoke with patient, wife, and family at bedside. Patient states he is from home with wife, still drives, Dr Montez Morita is PCP. He denies difficulties getting medications. He denies need for DME. He states he has working CPAP at home, does not have O2 or nebulizer. WATCH FOR O2 needs at DC. Patient states that he would like to follow with [facility] for [facility] if needed at DC. CM will continue to follow. Referral made to Ginette Otto clinical liaison [place] for potential DC needs.    Action/Plan:   Expected Discharge Date:                  Expected Discharge Plan:  [place]  In-House Referral:     Discharge planning Services  CM Consult  Post Acute Care Choice:    Choice offered to:     DME Arranged:    DME Agency:     HH Arranged:    [place] Agency:     Status of Service:  In process, will continue to follow  If discussed at Long Length of Stay Meetings, dates discussed:    Additional Comments:  Carles Collet, RN 12/07/2016, 12:17 PM

## 2016-12-08 LAB — BASIC METABOLIC PANEL
Anion gap: 12 (ref 5–15)
BUN: 17 mg/dL (ref 6–20)
CALCIUM: 8.4 mg/dL — AB (ref 8.9–10.3)
CO2: 22 mmol/L (ref 22–32)
Chloride: 106 mmol/L (ref 101–111)
Creatinine, Ser: 2.75 mg/dL — ABNORMAL HIGH (ref 0.61–1.24)
GFR calc Af Amer: 25 mL/min — ABNORMAL LOW (ref >60)
GFR, EST NON AFRICAN AMERICAN: 21 mL/min — AB (ref >60)
GLUCOSE: 91 mg/dL (ref 65–99)
POTASSIUM: 3 mmol/L — AB (ref 3.5–5.1)
Sodium: 140 mmol/L (ref 135–145)

## 2016-12-08 LAB — IRON AND TIBC
Iron: 28 ug/dL — ABNORMAL LOW (ref 45–182)
Saturation Ratios: 11 % — ABNORMAL LOW (ref 17.9–39.5)
TIBC: 258 ug/dL (ref 250–450)
UIBC: 230 ug/dL

## 2016-12-08 LAB — LIPID PANEL
CHOL/HDL RATIO: 4.4 ratio
CHOLESTEROL: 239 mg/dL — AB (ref 0–200)
HDL: 54 mg/dL (ref >40)
LDL Cholesterol: 158 mg/dL — ABNORMAL HIGH (ref 0–99)
Triglycerides: 137 mg/dL (ref <150)
VLDL: 27 mg/dL (ref 0–40)

## 2016-12-08 LAB — GLUCOSE, CAPILLARY
GLUCOSE-CAPILLARY: 88 mg/dL (ref 65–99)
GLUCOSE-CAPILLARY: 242 mg/dL — AB (ref 65–99)
Glucose-Capillary: 129 mg/dL — ABNORMAL HIGH (ref 65–99)
Glucose-Capillary: 141 mg/dL — ABNORMAL HIGH (ref 65–99)

## 2016-12-08 LAB — MAGNESIUM: MAGNESIUM: 2 mg/dL (ref 1.7–2.4)

## 2016-12-08 MED ORDER — FAMOTIDINE 20 MG PO TABS
20.0000 mg | ORAL_TABLET | Freq: Every day | ORAL | Status: DC
  Administered 2016-12-09 – 2016-12-10 (×2): 20 mg via ORAL
  Filled 2016-12-08 (×2): qty 1

## 2016-12-08 MED ORDER — POTASSIUM CHLORIDE CRYS ER 20 MEQ PO TBCR
40.0000 meq | EXTENDED_RELEASE_TABLET | Freq: Two times a day (BID) | ORAL | Status: AC
  Administered 2016-12-08 (×2): 40 meq via ORAL
  Filled 2016-12-08 (×2): qty 2

## 2016-12-08 MED ORDER — POTASSIUM CHLORIDE CRYS ER 20 MEQ PO TBCR: 40.0000 meq | EXTENDED_RELEASE_TABLET | Freq: Two times a day (BID) | ORAL | Status: DC

## 2016-12-08 MED ORDER — ENOXAPARIN SODIUM 30 MG/0.3ML ~~LOC~~ SOLN
30.0000 mg | SUBCUTANEOUS | Status: DC
  Administered 2016-12-09: 30 mg via SUBCUTANEOUS
  Filled 2016-12-08: qty 0.3

## 2016-12-08 MED ORDER — ATORVASTATIN CALCIUM 40 MG PO TABS
40.0000 mg | ORAL_TABLET | Freq: Every day | ORAL | Status: DC
  Administered 2016-12-08 – 2016-12-09 (×2): 40 mg via ORAL
  Filled 2016-12-08 (×2): qty 1

## 2016-12-08 NOTE — Progress Notes (Signed)
12/08/2016- Respiratory care note- Pt placed on CPAP and tolerating well.

## 2016-12-08 NOTE — Progress Notes (Addendum)
PROGRESS NOTE                                                                                                                                                                                                             Patient Demographics:    Michael Blackwell, is a 76 y.o. male, DOB - [birth year], BHA:[id number]  Admit date - 12/05/2016   Admitting Physician Rise Patience, MD  Outpatient Primary MD for the patient is [name], MD  LOS - 3  Outpatient Specialists: none  Chief Complaint  Patient presents with  . Shortness of Breath       Brief Narrative   76 y.o. male with history of hypertension, diabetes mellitus type 2, history of prostate cancer status post radiation treatment, hyperlipidemia presented to the ER because of shortness of breath. Patient started developing worsening shortness of breath last evening. Denies any chest pain pleuritic cough or fever or chills. Patient did have chest pain 3 weeks ago and had come to the ER at that time workup was negative.  Patient Found to have bilateral pleural effusion on chest x-ray with elevated BNP and required BiPAP in the ED. Also found to have hypertensive emergency. Admitted to telemetry. Also has  acute on chronic kidney disease stage III.   Subjective:   Dyspnea continues to improve.    Assessment  & Plan :   Principal problem Acute respiratory failure with hypoxia (HCC) Likely secondary to acute systolic CHF. Continue IV Lasix 40 mg every 12 hours. Strict I/O and daily weight. (-4L since admission) -Troponin peaked at 0.15. No chest pain symptoms. (Likely demand ischemia). Replenish low potassium. Repeat 2-D echo was LVH with systolic function of 97-35% with hypokinesis of inferior myocardium and grade 1 diastolic dysfunction.  Patient has stress test in October 2017 negative for ischemia but showed an EF of 49%. -Patient does not have a  cardiologist. Will establish care with outpatient cardiology.  Hypertensive emergency Placed a nitroglycerin patch on admission. Continue home dose clonidine. Hold HCTZ - triamterene and ARB given acute on chronic kidney disease. Increased dose of amlodipine to 10 mg daily and added hydralazine and Imdur. Add Beta blocker upon discharge. Blood pressure improving. Counseled on strict diet adherence. Needs continuous education on symptom management, medication adherence and outpt follow up.   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 ? Hypertensive nephropathy. baseline creatinine around 1.5. Hold HCTZ-triamterene and ARB. Monitor with diuresis. renal ultrasound without hydronephrosis, suggest medical renal disease. Will need outpatient referral to nephrology.  Type 2 diabetes mellitus Discontinue metformin given his renal function. Monitor on sliding scale coverage. Pending A1c.  Hypokalemia. Replenished.  Microcytic anemia Check iron panel.  Hyperlipidemia High LDL despite being on Lipitor. Will increase dose  History of prostate cancer status post radiation   Code Status : Full code  Family Communication  : Wife at bedside  Disposition Plan  : Home possibly tomorrow if continues to diabetes will and improved symptomatically.  Barriers For Discharge : Active symptoms  Consults  :  None  Procedures  :  2-D echo Renal ultrasound  DVT Prophylaxis  :  Lovenox -   Lab Results  Component Value Date   PLT 270 12/06/2016    Antibiotics  :    Anti-infectives    None        Objective:   Vitals:   12/07/16 2254 12/08/16 0500 12/08/16 0545 12/08/16 1231  BP:   137/64 126/62  Pulse: 89  85 100  Resp: 16  16 17   Temp:   99.2 F (37.3 C) 98.2 F (36.8 C)  TempSrc:   Oral Oral  SpO2:   95% 97%  Weight:  99 kg (218 lb 3.2 oz)    Height:        Wt Readings from Last 3 Encounters:  12/08/16 99 kg (218 lb 3.2 oz)  11/22/16 102.1 kg (225 lb)  03/01/16 105.1  kg (231 lb 9.6 oz)     Intake/Output Summary (Last 24 hours) at 12/08/16 1355 Last data filed at 12/08/16 1233  Gross per 24 hour  Intake             1170 ml  Output             1625 ml  Net             -455 ml     Physical Exam  Gen: not in distress HEENT:  moist mucosa, supple neck Chest: Improved breath sounds bilaterally CVS: N S1&S2, no murmurs GI: soft, NT, ND Musculoskeletal: warm, trace pitting edema bilaterally     Data Review:    CBC  Recent Labs Lab 12/06/16 0006 12/06/16 0020 12/06/16 0822  WBC 9.9  --  7.9  HGB 10.2* 10.2* 9.9*  HCT 31.6* 30.0* 30.5*  PLT 267  --  270  MCV 83.2  --  82.9  MCH 26.8  --  26.9  MCHC 32.3  --  32.5  RDW 17.5*  --  17.7*  LYMPHSABS  --   --  1.2  MONOABS  --   --  0.7  EOSABS  --   --  0.1  BASOSABS  --   --  0.0    Chemistries   Recent Labs Lab 12/05/16 1934 12/06/16 0006 12/06/16 0020 12/06/16 0822 12/07/16 0321 12/08/16 0323  NA 143  --  145 143 141 140  K 3.5  --  3.6 3.1* 2.9* 3.0*  CL 112*  --  109 114* 110 106  CO2 21*  --   --  20* 22 22  GLUCOSE 267*  --  178* 116* 78 91  BUN 14  --  15 13 14 17   CREATININE 2.26* 2.26* 2.30* 2.30* 2.46* 2.75*  CALCIUM 8.9  --   --  8.6* 8.9 8.4*  MG  --   --   --   --  2.1 2.0  AST 30  --   --  22  --   --   ALT 23  --   --  19  --   --   ALKPHOS 55  --   --  48  --   --   BILITOT 0.1*  --   --  0.4  --   --    ------------------------------------------------------------------------------------------------------------------  Recent Labs  12/08/16 0323  CHOL 239*  HDL 54  LDLCALC 158*  TRIG 137  CHOLHDL 4.4    No results found for: HGBA1C ------------------------------------------------------------------------------------------------------------------  Recent Labs  12/06/16 0006  TSH 1.333   ------------------------------------------------------------------------------------------------------------------ No results for input(s): VITAMINB12,  FOLATE, FERRITIN, TIBC, IRON, RETICCTPCT in the last 72 hours.  Coagulation profile No results for input(s): INR, PROTIME in the last 168 hours.  No results for input(s): DDIMER in the last 72 hours.  Cardiac Enzymes  Recent Labs Lab 12/06/16 0006 12/06/16 0822  TROPONINI 0.13* 0.15*  0.13*   ------------------------------------------------------------------------------------------------------------------    Component Value Date/Time   BNP 865.2 (H) 12/05/2016 1934    Inpatient Medications  Scheduled Meds: . amLODipine  10 mg Oral Daily  . aspirin  81 mg Oral Daily  . atorvastatin  40 mg Oral q1800  . calcium carbonate  1 tablet Oral Q breakfast   And  . magnesium oxide  200 mg Oral Q breakfast  . cloNIDine  0.1 mg Oral BID  . enoxaparin (LOVENOX) injection  40 mg Subcutaneous Q24H  . famotidine  20 mg Oral BID  . furosemide  40 mg Intravenous Q12H  . gemfibrozil  600 mg Oral BID AC  . glimepiride  4 mg Oral Q breakfast  . guaiFENesin  600 mg Oral BID  . hydrALAZINE  100 mg Oral Q8H  . insulin aspart  0-9 Units Subcutaneous TID WC  . isosorbide mononitrate  30 mg Oral Daily  . multivitamin with minerals  1 tablet Oral Daily  . omega-3 acid ethyl esters  1 g Oral Daily  . potassium chloride  40 mEq Oral BID  . tamsulosin  0.4 mg Oral Daily   Continuous Infusions: PRN Meds:.acetaminophen **OR** acetaminophen, hydrALAZINE  Micro Results No results found for this or any previous visit (from the past 240 hour(s)).  Radiology Reports Dg Chest 2 View  Result Date: 11/22/2016 CLINICAL DATA:  Chest pain EXAM: CHEST  2 VIEW COMPARISON:  02/06/2013 FINDINGS: Cardiac enlargement without heart failure. Lungs are clear without infiltrate effusion or mass. No adenopathy. IMPRESSION: No active cardiopulmonary disease. Electronically Signed   By: Franchot Gallo M.D.   On: 11/22/2016 14:16   US Renal  Result Date: 12/07/2016 CLINICAL DATA:  Acute renal failure EXAM: RENAL /  URINARY TRACT ULTRASOUND COMPLETE COMPARISON:  March 16, 2005 CT scan FINDINGS: Right Kidney: Length: 12.5 cm.  Echogenic cortex with multiple cysts. Left Kidney: Length: 11.2 cm.  Echogenic cortex with multiple cysts. Bladder: Appears normal for degree of bladder distention. IMPRESSION: 1. Bilateral increase cortical echogenicity suggests medical renal disease. Multiple bilateral renal cysts. 2. Bilateral pleural effusions. Electronically Signed   By: Dorise Bullion III M.D   On: 12/07/2016 10:44   Dg Chest Portable 1 View  Result Date: 12/05/2016 CLINICAL DATA:  Respiratory distress EXAM: PORTABLE CHEST 1 VIEW COMPARISON:  11/22/2016 FINDINGS: Suspect that there are small pleural effusions. Ground-glass densities are present within the left greater than right upper lobe and the left lower lung, suspicious for edema. There is mild cardiomegaly. No pneumothorax.  Cannot exclude in infiltrate at the left lung base. IMPRESSION: 1. Cardiomegaly with suspected small effusions 2. Bilateral left greater than right ground-glass densities suggestive of edema 3. Cannot exclude atelectasis or pneumonia at the left lung base. Electronically Signed   By: Donavan Foil M.D.   On: 12/05/2016 19:59    Time Spent in minutes  35   Louellen Molder M.D on 12/08/2016 at 1:55 PM  Between 7am to 7pm - Pager - [phone]  After 7pm go to [email or web address] - password [facility]  [facility] -  Office  [phone]

## 2016-12-08 NOTE — Plan of Care (Addendum)
Problem: Physical Regulation: Goal: Ability to maintain clinical measurements within normal limits will improve Outcome: Progressing Pt continuing to tolerate diuresis. Pt breathing has improved. Will continue to monitor.

## 2016-12-09 DIAGNOSIS — N179 Acute kidney failure, unspecified: Secondary | ICD-10-CM

## 2016-12-09 DIAGNOSIS — I493 Ventricular premature depolarization: Secondary | ICD-10-CM

## 2016-12-09 DIAGNOSIS — I5043 Acute on chronic combined systolic (congestive) and diastolic (congestive) heart failure: Secondary | ICD-10-CM

## 2016-12-09 LAB — GLUCOSE, CAPILLARY
GLUCOSE-CAPILLARY: 88 mg/dL (ref 65–99)
GLUCOSE-CAPILLARY: 121 mg/dL — AB (ref 65–99)
GLUCOSE-CAPILLARY: 179 mg/dL — AB (ref 65–99)
Glucose-Capillary: 134 mg/dL — ABNORMAL HIGH (ref 65–99)

## 2016-12-09 LAB — HEMOGLOBIN A1C
HEMOGLOBIN A1C: 6.1 % — AB (ref 4.8–5.6)
MEAN PLASMA GLUCOSE: 128 mg/dL

## 2016-12-09 LAB — MAGNESIUM: Magnesium: 2.2 mg/dL (ref 1.7–2.4)

## 2016-12-09 LAB — BASIC METABOLIC PANEL
ANION GAP: 10 (ref 5–15)
BUN: 21 mg/dL — AB (ref 6–20)
CALCIUM: 8.2 mg/dL — AB (ref 8.9–10.3)
CO2: 20 mmol/L — ABNORMAL LOW (ref 22–32)
Chloride: 108 mmol/L (ref 101–111)
Creatinine, Ser: 2.85 mg/dL — ABNORMAL HIGH (ref 0.61–1.24)
GFR calc Af Amer: 24 mL/min — ABNORMAL LOW (ref >60)
GFR, EST NON AFRICAN AMERICAN: 20 mL/min — AB (ref >60)
Glucose, Bld: 80 mg/dL (ref 65–99)
POTASSIUM: 3.6 mmol/L (ref 3.5–5.1)
SODIUM: 138 mmol/L (ref 135–145)

## 2016-12-09 MED ORDER — FUROSEMIDE 40 MG PO TABS
60.0000 mg | ORAL_TABLET | Freq: Two times a day (BID) | ORAL | Status: DC
  Administered 2016-12-09 – 2016-12-10 (×3): 60 mg via ORAL
  Filled 2016-12-09 (×3): qty 1

## 2016-12-09 MED ORDER — POTASSIUM CHLORIDE CRYS ER 20 MEQ PO TBCR
40.0000 meq | EXTENDED_RELEASE_TABLET | Freq: Once | ORAL | Status: AC
  Administered 2016-12-09: 40 meq via ORAL
  Filled 2016-12-09: qty 2

## 2016-12-09 MED ORDER — CARVEDILOL 6.25 MG PO TABS
6.2500 mg | ORAL_TABLET | Freq: Two times a day (BID) | ORAL | Status: DC
  Administered 2016-12-09 – 2016-12-10 (×3): 6.25 mg via ORAL
  Filled 2016-12-09 (×2): qty 1

## 2016-12-09 NOTE — Progress Notes (Signed)
PROGRESS NOTE                                                                                                                                                                                                             Patient Demographics:    Michael Blackwell, is a 76 y.o. male, DOB - [birth year], OFB:[id number]  Admit date - 12/05/2016   Admitting Physician Rise Patience, MD  Outpatient Primary MD for the patient is [name], MD  LOS - 4  Outpatient Specialists: none  Chief Complaint  Patient presents with  . Shortness of Breath       Brief Narrative   76 y.o. male with history of hypertension, diabetes mellitus type 2, history of prostate cancer status post radiation treatment, hyperlipidemia presented to the ER because of shortness of breath. Patient started developing worsening shortness of breath last evening. Denies any chest pain pleuritic cough or fever or chills. Patient did have chest pain 3 weeks ago and had come to the ER at that time workup was negative.  Patient Found to have bilateral pleural effusion on chest x-ray with elevated BNP and required BiPAP in the ED. Also found to have hypertensive emergency. Admitted to telemetry. Also has  acute on chronic kidney disease stage III.   Subjective:   Dyspnea continues to improve.    Assessment  & Plan :   Principal problem Acute respiratory failure with hypoxia (HCC) Likely secondary to acute systolic CHF.  Strict I/O and daily weight. Diuresed well on IV Lasix. (-5.2 L since admission). Transition to by mouth Lasix 60 mg twice a day. -Troponin peaked at 0.15. No chest pain symptoms. (Likely demand ischemia). Replenish low potassium. Repeat 2-D echo was LVH with systolic function of 78-24% with hypokinesis of inferior myocardium and grade 1 diastolic dysfunction.  Patient has stress test in October 2017 negative for ischemia but showed an EF of  49%. -Patient does not have a cardiologist. Discussed with Dr. Einar Gip,  will arrange outpatient follow-up.  Hypertensive emergency Placed a nitroglycerin patch on admission. Continue home dose clonidine. Hold HCTZ - triamterene and ARB given acute on chronic kidney disease. Increased dose of amlodipine to 10 mg daily and added hydralazine and Imdur. Added carvedilol today. Blood pressure has been stable. Counseled on strict diet adherence. Needs continuous education on symptom management, medication  adherence and outpt follow up.   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 ? Hypertensive nephropathy. baseline creatinine around 1.5. Hold HCTZ-triamterene and ARB. Also discontinue metformin. Monitor with diuresis. renal ultrasound without hydronephrosis, suggest medical renal disease. Will need outpatient referral to nephrology.  Type 2 diabetes mellitus Discontinue metformin given his renal function. Continue Amaryl. Monitor on sliding scale coverage. Pending A1c.  Hypokalemia. Replenished.  Iron deficiency anemia Iron supplement. Recommend outpatient referral to GI.  Hyperlipidemia High LDL despite being on Lipitor. increased dose  History of prostate cancer status post radiation   Code Status : Full code  Family Communication  : Wife at bedside  Disposition Plan  : Home Tomorrow if symptoms continue to improve on by mouth Lasix  Barriers For Discharge : Active symptoms  Consults  :  None  Procedures  :  2-D echo Renal ultrasound  DVT Prophylaxis  :  Lovenox -   Lab Results  Component Value Date   PLT 270 12/06/2016    Antibiotics  :    Anti-infectives    None        Objective:   Vitals:   12/08/16 0545 12/08/16 1231 12/08/16 2004 12/09/16 0556  BP: 137/64 126/62 (!) 146/63 (!) 149/69  Pulse: 85 100 88 83  Resp: 16 17  18   Temp: 99.2 F (37.3 C) 98.2 F (36.8 C) 98.1 F (36.7 C) 98.1 F (36.7 C)  TempSrc: Oral Oral Oral Oral  SpO2: 95% 97% 100%  100%  Weight:    99.7 kg (219 lb 12.8 oz)  Height:        Wt Readings from Last 3 Encounters:  12/09/16 99.7 kg (219 lb 12.8 oz)  11/22/16 102.1 kg (225 lb)  03/01/16 105.1 kg (231 lb 9.6 oz)     Intake/Output Summary (Last 24 hours) at 12/09/16 1012 Last data filed at 12/09/16 0600  Gross per 24 hour  Intake              960 ml  Output             2175 ml  Net            -1215 ml     Physical Exam  Gen: not in distress HEENT:  moist mucosa, supple neck Chest: Improved breath sounds bilaterally CVS: N S1&S2, no murmurs GI: soft, NT, ND Musculoskeletal: warm, trace pitting edema bilaterally( improved)     Data Review:    CBC  Recent Labs Lab 12/06/16 0006 12/06/16 0020 12/06/16 0822  WBC 9.9  --  7.9  HGB 10.2* 10.2* 9.9*  HCT 31.6* 30.0* 30.5*  PLT 267  --  270  MCV 83.2  --  82.9  MCH 26.8  --  26.9  MCHC 32.3  --  32.5  RDW 17.5*  --  17.7*  LYMPHSABS  --   --  1.2  MONOABS  --   --  0.7  EOSABS  --   --  0.1  BASOSABS  --   --  0.0    Chemistries   Recent Labs Lab 12/05/16 1934  12/06/16 0020 12/06/16 0822 12/07/16 0321 12/08/16 0323 12/09/16 0443 12/09/16 0841  NA 143  --  145 143 141 140 138  --   K 3.5  --  3.6 3.1* 2.9* 3.0* 3.6  --   CL 112*  --  109 114* 110 106 108  --   CO2 21*  --   --  20* 22 22  20*  --   GLUCOSE 267*  --  178* 116* 78 91 80  --   BUN 14  --  15 13 14 17  21*  --   CREATININE 2.26*  < > 2.30* 2.30* 2.46* 2.75* 2.85*  --   CALCIUM 8.9  --   --  8.6* 8.9 8.4* 8.2*  --   MG  --   --   --   --  2.1 2.0  --  2.2  AST 30  --   --  22  --   --   --   --   ALT 23  --   --  19  --   --   --   --   ALKPHOS 55  --   --  48  --   --   --   --   BILITOT 0.1*  --   --  0.4  --   --   --   --   < > = values in this interval not displayed. ------------------------------------------------------------------------------------------------------------------  Recent Labs  12/08/16 0323  CHOL 239*  HDL 54  LDLCALC 158*    TRIG 137  CHOLHDL 4.4    No results found for: HGBA1C ------------------------------------------------------------------------------------------------------------------ No results for input(s): TSH, T4TOTAL, T3FREE, THYROIDAB in the last 72 hours.  Invalid input(s): FREET3 ------------------------------------------------------------------------------------------------------------------  Recent Labs  12/08/16 1520  TIBC 258  IRON 28*    Coagulation profile No results for input(s): INR, PROTIME in the last 168 hours.  No results for input(s): DDIMER in the last 72 hours.  Cardiac Enzymes  Recent Labs Lab 12/06/16 0006 12/06/16 0822  TROPONINI 0.13* 0.15*  0.13*   ------------------------------------------------------------------------------------------------------------------    Component Value Date/Time   BNP 865.2 (H) 12/05/2016 1934    Inpatient Medications  Scheduled Meds: . amLODipine  10 mg Oral Daily  . aspirin  81 mg Oral Daily  . atorvastatin  40 mg Oral q1800  . calcium carbonate  1 tablet Oral Q breakfast   And  . magnesium oxide  200 mg Oral Q breakfast  . carvedilol  6.25 mg Oral BID WC  . cloNIDine  0.1 mg Oral BID  . enoxaparin (LOVENOX) injection  30 mg Subcutaneous Q24H  . famotidine  20 mg Oral Daily  . furosemide  60 mg Oral BID  . gemfibrozil  600 mg Oral BID AC  . glimepiride  4 mg Oral Q breakfast  . guaiFENesin  600 mg Oral BID  . hydrALAZINE  100 mg Oral Q8H  . insulin aspart  0-9 Units Subcutaneous TID WC  . isosorbide mononitrate  30 mg Oral Daily  . multivitamin with minerals  1 tablet Oral Daily  . omega-3 acid ethyl esters  1 g Oral Daily  . potassium chloride  40 mEq Oral Once  . tamsulosin  0.4 mg Oral Daily   Continuous Infusions: PRN Meds:.acetaminophen **OR** acetaminophen, hydrALAZINE  Micro Results No results found for this or any previous visit (from the past 240 hour(s)).  Radiology Reports Dg Chest 2  View  Result Date: 11/22/2016 CLINICAL DATA:  Chest pain EXAM: CHEST  2 VIEW COMPARISON:  02/06/2013 FINDINGS: Cardiac enlargement without heart failure. Lungs are clear without infiltrate effusion or mass. No adenopathy. IMPRESSION: No active cardiopulmonary disease. Electronically Signed   By: Franchot Gallo M.D.   On: 11/22/2016 14:16   US Renal  Result Date: 12/07/2016 CLINICAL DATA:  Acute renal failure EXAM: RENAL / URINARY TRACT ULTRASOUND COMPLETE COMPARISON:  March 16, 2005 CT scan FINDINGS: Right Kidney: Length: 12.5 cm.  Echogenic cortex with multiple cysts. Left Kidney: Length: 11.2 cm.  Echogenic cortex with multiple cysts. Bladder: Appears normal for degree of bladder distention. IMPRESSION: 1. Bilateral increase cortical echogenicity suggests medical renal disease. Multiple bilateral renal cysts. 2. Bilateral pleural effusions. Electronically Signed   By: Dorise Bullion III M.D   On: 12/07/2016 10:44   Dg Chest Portable 1 View  Result Date: 12/05/2016 CLINICAL DATA:  Respiratory distress EXAM: PORTABLE CHEST 1 VIEW COMPARISON:  11/22/2016 FINDINGS: Suspect that there are small pleural effusions. Ground-glass densities are present within the left greater than right upper lobe and the left lower lung, suspicious for edema. There is mild cardiomegaly. No pneumothorax. Cannot exclude in infiltrate at the left lung base. IMPRESSION: 1. Cardiomegaly with suspected small effusions 2. Bilateral left greater than right ground-glass densities suggestive of edema 3. Cannot exclude atelectasis or pneumonia at the left lung base. Electronically Signed   By: Donavan Foil M.D.   On: 12/05/2016 19:59    Time Spent in minutes  35   Louellen Molder M.D on 12/09/2016 at 10:12 AM  Between 7am to 7pm - Pager - [phone]  After 7pm go to [email or web address] - password [facility]  [facility] -  Office  [phone]

## 2016-12-09 NOTE — Progress Notes (Signed)
12/09/2016- Respiratory care note- Pt placed on CPAP and tolerating well

## 2016-12-10 DIAGNOSIS — J9601 Acute respiratory failure with hypoxia: Secondary | ICD-10-CM | POA: Diagnosis present

## 2016-12-10 DIAGNOSIS — E876 Hypokalemia: Secondary | ICD-10-CM | POA: Diagnosis present

## 2016-12-10 DIAGNOSIS — D509 Iron deficiency anemia, unspecified: Secondary | ICD-10-CM

## 2016-12-10 DIAGNOSIS — E785 Hyperlipidemia, unspecified: Secondary | ICD-10-CM | POA: Diagnosis present

## 2016-12-10 DIAGNOSIS — I10 Essential (primary) hypertension: Secondary | ICD-10-CM | POA: Diagnosis present

## 2016-12-10 DIAGNOSIS — I5043 Acute on chronic combined systolic (congestive) and diastolic (congestive) heart failure: Secondary | ICD-10-CM | POA: Diagnosis present

## 2016-12-10 DIAGNOSIS — N179 Acute kidney failure, unspecified: Secondary | ICD-10-CM | POA: Diagnosis present

## 2016-12-10 DIAGNOSIS — N183 Chronic kidney disease, stage 3 (moderate): Secondary | ICD-10-CM

## 2016-12-10 LAB — BASIC METABOLIC PANEL
ANION GAP: 9 (ref 5–15)
BUN: 20 mg/dL (ref 6–20)
CO2: 22 mmol/L (ref 22–32)
Calcium: 8.3 mg/dL — ABNORMAL LOW (ref 8.9–10.3)
Chloride: 108 mmol/L (ref 101–111)
Creatinine, Ser: 2.72 mg/dL — ABNORMAL HIGH (ref 0.61–1.24)
GFR, EST AFRICAN AMERICAN: 25 mL/min — AB (ref >60)
GFR, EST NON AFRICAN AMERICAN: 22 mL/min — AB (ref >60)
GLUCOSE: 114 mg/dL — AB (ref 65–99)
POTASSIUM: 3.4 mmol/L — AB (ref 3.5–5.1)
SODIUM: 139 mmol/L (ref 135–145)

## 2016-12-10 LAB — GLUCOSE, CAPILLARY: GLUCOSE-CAPILLARY: 115 mg/dL — AB (ref 65–99)

## 2016-12-10 MED ORDER — HYDRALAZINE HCL 100 MG PO TABS: 100.0000 mg | ORAL_TABLET | Freq: Three times a day (TID) | ORAL | 0 refills | Status: DC

## 2016-12-10 MED ORDER — CARVEDILOL 6.25 MG PO TABS: 6.2500 mg | ORAL_TABLET | Freq: Two times a day (BID) | ORAL | 0 refills | Status: DC

## 2016-12-10 MED ORDER — POTASSIUM CHLORIDE CRYS ER 20 MEQ PO TBCR: 20.0000 meq | EXTENDED_RELEASE_TABLET | Freq: Every day | ORAL | 0 refills | Status: DC

## 2016-12-10 MED ORDER — ISOSORBIDE MONONITRATE ER 30 MG PO TB24: 30.0000 mg | ORAL_TABLET | Freq: Every day | ORAL | 0 refills | Status: DC

## 2016-12-10 MED ORDER — ROSUVASTATIN CALCIUM 10 MG PO TABS
10.0000 mg | ORAL_TABLET | Freq: Every day | ORAL | 0 refills | Status: DC
Start: 2016-12-10 — End: 2019-05-22

## 2016-12-10 MED ORDER — FUROSEMIDE 40 MG PO TABS: 60.0000 mg | ORAL_TABLET | Freq: Two times a day (BID) | ORAL | 0 refills | Status: DC

## 2016-12-10 MED ORDER — FERROUS SULFATE 325 (65 FE) MG PO TABS: 325.0000 mg | ORAL_TABLET | Freq: Two times a day (BID) | ORAL | 0 refills | Status: DC

## 2016-12-10 MED ORDER — AMLODIPINE BESYLATE 10 MG PO TABS: 10.0000 mg | ORAL_TABLET | Freq: Every day | ORAL | 0 refills | Status: DC

## 2016-12-10 MED ORDER — POTASSIUM CHLORIDE CRYS ER 20 MEQ PO TBCR
40.0000 meq | EXTENDED_RELEASE_TABLET | Freq: Once | ORAL | Status: AC
  Administered 2016-12-10: 40 meq via ORAL
  Filled 2016-12-10: qty 2

## 2016-12-10 NOTE — Care Management Note (Signed)
Case Management Note Previous CM note initiated by Carles Collet, RN 12/07/2016, 12:17 PM   Patient Details  Name: KIAN OTTAVIANO MRN: [id number] Date of Birth: [birth year]  Subjective/Objective:                 Spoke with patient, wife, and family at bedside. Patient states he is from home with wife, still drives, Dr Montez Morita is PCP. He denies difficulties getting medications. He denies need for DME. He states he has working CPAP at home, does not have O2 or nebulizer. WATCH FOR O2 needs at DC. Patient states that he would like to follow with [facility] for [facility] if needed at DC. CM will continue to follow. Referral made to Ginette Otto clinical liaison [place] for potential DC needs.    Action/Plan:   Expected Discharge Date:  12/10/16               Expected Discharge Plan:  [place]  In-House Referral:     Discharge planning Services  CM Consult  Post Acute Care Choice:    Choice offered to:     DME Arranged:    DME Agency:     HH Arranged:    [place] Agency:     Status of Service:  Completed, signed off  If discussed at [employer], dates discussed:    Additional Comments:  12/10/16- 1000- Saraiyah Hemminger RN, CM- pt for d/c home today- no CM needs noted for d/c - pt now on RA.   Dahlia Client [place], RN 12/10/2016, 11:10 AM [phone]

## 2016-12-10 NOTE — Discharge Summary (Signed)
Physician Discharge Summary  Michael Blackwell ZJI:[id number] DOB: [birth year] DOA: 12/05/2016  PCP: [name], MD  Admit date: 12/05/2016 Discharge date: 12/10/2016  Admitted From: home Disposition:  home  Recommendations for Outpatient Follow-up:  1. Follow up with PCP in 1 weeks. Please obtain BMET. 2. Please refer patient to outpatient nephrology and GI. 3. Please adjust and titrate blood pressure medications. 4. Patient to follow-up with cardiologist Dr Einar Gip to establish care.    Home Health:none Equipment/Devices: none  Discharge Condition:fair CODE STATUS:full code Diet recommendation: Heart Healthy / Carb Modified     Discharge Diagnoses:  Principal Problem:   Acute on chronic combined systolic and diastolic heart failure (HCC)   Active Problems:   Essential hypertension, malignant   Hypertensive urgency   Type 2 diabetes mellitus with renal manifestations (HCC)   Acute respiratory failure with hypoxia (HCC)   Acute renal failure superimposed on stage 3 chronic kidney disease (HCC)   Hyperlipidemia LDL goal <100   Hypokalemia   Iron deficiency anemia   Malignant neoplasm of prostate (Michael Blackwell)   Brief narrative/history of present illness Please refer to admission H&P for details, in brief, 76 y.o.malewith history of hypertension, diabetes mellitus type 2, history of prostate cancer status post radiation treatment, hyperlipidemia presented to the ER because of shortness of breath. Patient started developing worsening shortness of breath last evening. Denies any chest pain pleuritic cough or fever or chills. Patient did have chest pain 3 weeks ago and had come to the ER at that time workup was negative. Patient Found to have bilateral pleural effusion on chest x-ray with elevated BNP and required BiPAP in the ED. Also found to have hypertensive emergency. Admitted to telemetry. Also has  acute on chronic kidney disease stage III.  Hospital course   Principal  problem Acute respiratory failure with hypoxia (HCC) Secondary to acute on chronic combined systolic and diastolic CHF. Placed on IV Lasix with good diuresis (-6. 3L )  Transitioned to by mouth Lasix 60 mg twice a day and discharged on this regimen. Prescribed potassium supplement. -Troponin peaked at 0.15. No chest pain symptoms. (Likely demand ischemia). Repeat 2-D echo was LVH with systolic function of 17-51% with hypokinesis of inferior myocardium and grade 1 diastolic dysfunction.  Patient had stress test in October 2017 negative for ischemia but showed an EF of 49%. -Patient does not have a cardiologist. Discussed with Dr. Einar Gip,  will arrange outpatient follow-up.  Heart failure education and written instructions provided.  Hypertensive emergency Placed a nitroglycerin patch on admission. Continued home dose clonidine. . Increased dose of amlodipine to 10 mg daily . added hydralazine,  Imdur and carvedilol patient is being discharged on multiple antihypertensives. Discontinued  HCTZ - triamterene and ARB given acute on chronic kidney disease. Blood pressure hasremained stable.  Counseled on strict diet adherence. Provide educational resources on DASH diet and heart failure monitoring.    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 baseline creatinine around 1.5. Continue ARB, HCTZ-triamterene. Also discontinued metformin. Symptoms much improved with aggressive diuresis. renal ultrasound without hydronephrosis, suggest medical renal disease. Will need outpatient referral to nephrology. Monitor renal function during outpatient follow-up.  Type 2 diabetes mellitus Discontinue metformin given his renal function. Continue Amaryl. Perclose was well controlled. A1c of 6.1.  Hypokalemia. Replenished.  Iron deficiency anemia Added iron supplement. Recommend outpatient referral to GI.  Hyperlipidemia High LDL despite being on Lipitor.  Switched to [employer].  History of prostate cancer status post radiation  Family Communication  : Wife at bedside  Disposition Plan  : Home   Consults  :  None  Procedures  :  2-D echo Renal ultrasound   Discharge Instructions   Allergies as of 12/10/2016   No Known Allergies     Medication List    STOP taking these medications   atorvastatin 20 MG tablet Commonly known as:  LIPITOR   losartan 100 MG tablet Commonly known as:  COZAAR   metFORMIN 500 MG tablet Commonly known as:  GLUCOPHAGE   naproxen sodium 220 MG tablet Commonly known as:  ANAPROX   triamterene-hydrochlorothiazide 37.5-25 MG tablet Commonly known as:  MAXZIDE-25     TAKE these medications   amLODipine 10 MG tablet Commonly known as:  NORVASC Take 1 tablet (10 mg total) by mouth daily. What changed:  medication strength  how much to take   aspirin 81 MG tablet Take 81 mg by mouth daily.   Calcium-Magnesium-Zinc 500-250-12.5 MG Tabs Take 1-2 tablets by mouth daily.   carvedilol 6.25 MG tablet Commonly known as:  COREG Take 1 tablet (6.25 mg total) by mouth 2 (two) times daily with a meal.   CENTRUM SILVER 50+MEN Tabs Take 1 tablet by mouth daily.   cloNIDine 0.1 MG tablet Commonly known as:  CATAPRES Take 1 tablet by mouth 2 (two) times daily.   ferrous sulfate 325 (65 FE) MG tablet Take 1 tablet (325 mg total) by mouth 2 (two) times daily with a meal.   fexofenadine 180 MG tablet Commonly known as:  ALLEGRA Take 180 mg by mouth daily as needed for allergies or rhinitis.   Fish Oil 1000 MG Caps Take 1,000 mg by mouth daily.   furosemide 40 MG tablet Commonly known as:  LASIX Take 1.5 tablets (60 mg total) by mouth 2 (two) times daily.   gemfibrozil 600 MG tablet Commonly known as:  LOPID Take 600 mg by mouth 2 (two) times daily before a meal.   glimepiride 4 MG tablet Commonly known as:  AMARYL Take 4 mg by mouth daily with breakfast.   Glucosamine  Chondroitin Triple Tabs Take 1-2 tablets by mouth daily.   hydrALAZINE 100 MG tablet Commonly known as:  APRESOLINE Take 1 tablet (100 mg total) by mouth every 8 (eight) hours.   isosorbide mononitrate 30 MG 24 hr tablet Commonly known as:  IMDUR Take 1 tablet (30 mg total) by mouth daily.   potassium chloride SA 20 MEQ tablet Commonly known as:  K-DUR,KLOR-CON Take 1 tablet (20 mEq total) by mouth daily.   ranitidine 150 MG capsule Commonly known as:  ZANTAC Take 150 mg by mouth daily before breakfast.   rosuvastatin 10 MG tablet Commonly known as:  CRESTOR Take 1 tablet (10 mg total) by mouth daily.   SUPER B COMPLEX/VITAMIN C Tabs Take 1 tablet by mouth daily.   tamsulosin 0.4 MG Caps capsule Commonly known as:  FLOMAX Take 1 capsule (0.4 mg total) by mouth daily.   VISINE ADVANCED RELIEF 0.05-0.1-1-1 % Soln Generic drug:  Tetrahydroz-Dextran-PEG-Povid Place 1-2 drops into both eyes 3 (three) times daily as needed (for eye irritation).      Follow-up Oretta, MD. Schedule an appointment as soon as possible for a visit in 1 week(s).   Specialty:  Cardiology Contact information: [place] [address] [phone]        Adrian Prows, MD. Schedule an appointment as soon as possible for a visit in  1 week(s).   Specialty:  Cardiology Contact information: [place] [address] [phone]        Adrian Prows, MD .   Specialty:  Cardiology Contact information: [place]. [address] [place] [address] [phone]          No Known Allergies      Procedures/Studies: Dg Chest 2 View  Result Date: 11/22/2016 CLINICAL DATA:  Chest pain EXAM: CHEST  2 VIEW COMPARISON:  02/06/2013 FINDINGS: Cardiac enlargement without heart failure. Lungs are clear without infiltrate effusion or mass. No adenopathy. IMPRESSION: No active cardiopulmonary disease. Electronically Signed   By:  Franchot Gallo M.D.   On: 11/22/2016 14:16   US Renal  Result Date: 12/07/2016 CLINICAL DATA:  Acute renal failure EXAM: RENAL / URINARY TRACT ULTRASOUND COMPLETE COMPARISON:  March 16, 2005 CT scan FINDINGS: Right Kidney: Length: 12.5 cm.  Echogenic cortex with multiple cysts. Left Kidney: Length: 11.2 cm.  Echogenic cortex with multiple cysts. Bladder: Appears normal for degree of bladder distention. IMPRESSION: 1. Bilateral increase cortical echogenicity suggests medical renal disease. Multiple bilateral renal cysts. 2. Bilateral pleural effusions. Electronically Signed   By: Dorise Bullion III M.D   On: 12/07/2016 10:44   Dg Chest Portable 1 View  Result Date: 12/05/2016 CLINICAL DATA:  Respiratory distress EXAM: PORTABLE CHEST 1 VIEW COMPARISON:  11/22/2016 FINDINGS: Suspect that there are small pleural effusions. Ground-glass densities are present within the left greater than right upper lobe and the left lower lung, suspicious for edema. There is mild cardiomegaly. No pneumothorax. Cannot exclude in infiltrate at the left lung base. IMPRESSION: 1. Cardiomegaly with suspected small effusions 2. Bilateral left greater than right ground-glass densities suggestive of edema 3. Cannot exclude atelectasis or pneumonia at the left lung base. Electronically Signed   By: Donavan Foil M.D.   On: 12/05/2016 19:59    2-D echo Study Conclusions  - Left ventricle: The cavity size was normal. Wall thickness was   increased in a pattern of mild LVH. Systolic function was normal.   The estimated ejection fraction was in the range of 50% to 55%.   There is hypokinesis of the inferior myocardium. Doppler   parameters are consistent with abnormal left ventricular   relaxation (grade 1 diastolic dysfunction). Doppler parameters   are consistent with high ventricular filling pressure. - Mitral valve: Calcified annulus. 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 Valve area by pressure half-time: 1.53  cm^2. Valve   area by continuity equation (using LVOT flow): 1.78 cm^2. - Left atrium: The atrium was moderately dilated. - Pericardium, extracardiac: A trivial pericardial effusion was   identified.  Impressions:  - Inferior hypokinesis with overall low normal LV systolic   function; grade 1 diastolic dysfunction; mild LVH; elevated LV   filling pressure; MAC with mild MS; moderate LAE.   Subjective: Dyspnea much improved. Leg swellings have also significantly improved  Discharge Exam: Vitals:   12/09/16 2102 12/10/16 0552  BP: 126/67 (!) 160/73  Pulse: 80 75  Resp: 18 18  Temp: 98.2 F (36.8 C) 98.5 F (36.9 C)   Vitals:   12/09/16 0556 12/09/16 1327 12/09/16 2102 12/10/16 0552  BP: (!) 149/69 119/62 126/67 (!) 160/73  Pulse: 83 90 80 75  Resp: 18 20 18 18   Temp: 98.1 F (36.7 C) 97.9 F (36.6 C) 98.2 F (36.8 C) 98.5 F (36.9 C)  TempSrc: Oral Oral Oral Oral  SpO2: 100% 100% 98%  99%  Weight: 99.7 kg (219 lb 12.8 oz)   98.9 kg (218 lb)  Height:        Gen: not in distress HEENT:  moist mucosa, supple neck Chest: Improved breath sounds bilaterally CVS: N S1&S2, no murmurs GI: soft, NT, ND Musculoskeletal: warm, trace pitting edema bilaterally( improved)     The results of significant diagnostics from this hospitalization (including imaging, microbiology, ancillary and laboratory) are listed below for reference.     Microbiology: No results found for this or any previous visit (from the past 240 hour(s)).   Labs: BNP (last 3 results)  Recent Labs  11/22/16 1304 12/05/16 1934  BNP 809.8* 938.1*   Basic Metabolic Panel:  Recent Labs Lab 12/06/16 0822 12/07/16 0321 12/08/16 0323 12/09/16 0443 12/09/16 0841 12/10/16 0158  NA 143 141 140 138  --  139  K 3.1* 2.9* 3.0* 3.6  --  3.4*  CL 114* 110 106 108  --  108  CO2 20* 22 22 20*  --  22  GLUCOSE 116* 78 91 80  --  114*  BUN 13 14 17  21*  --  20  CREATININE 2.30* 2.46* 2.75* 2.85*  --   2.72*  CALCIUM 8.6* 8.9 8.4* 8.2*  --  8.3*  MG  --  2.1 2.0  --  2.2  --    Liver Function Tests:  Recent Labs Lab 12/05/16 1934 12/06/16 0822  AST 30 22  ALT 23 19  ALKPHOS 55 48  BILITOT 0.1* 0.4  PROT 6.2* 5.4*  ALBUMIN 2.3* 2.1*   No results for input(s): LIPASE, AMYLASE in the last 168 hours. No results for input(s): AMMONIA in the last 168 hours. CBC:  Recent Labs Lab 12/06/16 0006 12/06/16 0020 12/06/16 0822  WBC 9.9  --  7.9  NEUTROABS  --   --  5.8  HGB 10.2* 10.2* 9.9*  HCT 31.6* 30.0* 30.5*  MCV 83.2  --  82.9  PLT 267  --  270   Cardiac Enzymes:  Recent Labs Lab 12/06/16 0006 12/06/16 0822  TROPONINI 0.13* 0.15*  0.13*   BNP: Invalid input(s): POCBNP CBG:  Recent Labs Lab 12/08/16 2039 12/09/16 0555 12/09/16 1213 12/09/16 1658 12/09/16 2130  GLUCAP 141* 88 121* 179* 134*   D-Dimer No results for input(s): DDIMER in the last 72 hours. Hgb A1c  Recent Labs  12/08/16 0323  HGBA1C 6.1*   Lipid Profile  Recent Labs  12/08/16 0323  CHOL 239*  HDL 54  LDLCALC 158*  TRIG 137  CHOLHDL 4.4   Thyroid function studies No results for input(s): TSH, T4TOTAL, T3FREE, THYROIDAB in the last 72 hours.  Invalid input(s): FREET3 Anemia work up  Recent Labs  12/08/16 1520  TIBC 258  IRON 28*   Urinalysis No results found for: COLORURINE, APPEARANCEUR, LABSPEC, [place], GLUCOSEU, HGBUR, BILIRUBINUR, KETONESUR, PROTEINUR, UROBILINOGEN, NITRITE, LEUKOCYTESUR Sepsis Labs Invalid input(s): PROCALCITONIN,  WBC,  LACTICIDVEN Microbiology No results found for this or any previous visit (from the past 240 hour(s)).   Time coordinating discharge: Over 30 minutes  SIGNED:   Louellen Molder, MD  [facility] 12/10/2016, 8:27 AM Pager   If 7PM-7AM, please contact night-coverage [email or web address] Password [email or web address]

## 2016-12-10 NOTE — Discharge Instructions (Signed)
DASH Eating Plan DASH stands for "Dietary Approaches to Stop Hypertension." The DASH eating plan is a healthy eating plan that has been shown to reduce high blood pressure (hypertension). It may also reduce your risk for type 2 diabetes, heart disease, and stroke. The DASH eating plan may also help with weight loss. What are tips for following this plan? General guidelines   Avoid eating more than 2,300 mg (milligrams) of salt (sodium) a day. If you have hypertension, you may need to reduce your sodium intake to 1,500 mg a day.  Limit alcohol intake to no more than 1 drink a day for nonpregnant women and 2 drinks a day for men. One drink equals 12 oz of beer, 5 oz of wine, or 1 oz of hard liquor.  Work with your health care provider to maintain a healthy body weight or to lose weight. Ask what an ideal weight is for you.  Get at least 30 minutes of exercise that causes your heart to beat faster (aerobic exercise) most days of the week. Activities may include walking, swimming, or biking.  Work with your health care provider or diet and nutrition specialist (dietitian) to adjust your eating plan to your individual calorie needs. Reading food labels   Check food labels for the amount of sodium per serving. Choose foods with less than 5 percent of the Daily Value of sodium. Generally, foods with less than 300 mg of sodium per serving fit into this eating plan.  To find whole grains, look for the word "whole" as the first word in the ingredient list. Shopping   Buy products labeled as "low-sodium" or "no salt added."  Buy fresh foods. Avoid canned foods and premade or frozen meals. Cooking   Avoid adding salt when cooking. Use salt-free seasonings or herbs instead of table salt or sea salt. Check with your health care provider or pharmacist before using salt substitutes.  Do not fry foods. Cook foods using healthy methods such as baking, boiling, grilling, and broiling instead.  Cook with  heart-healthy oils, such as olive, canola, soybean, or sunflower oil. Meal planning    Eat a balanced diet that includes:  5 or more servings of fruits and vegetables each day. At each meal, try to fill half of your plate with fruits and vegetables.  Up to 6-8 servings of whole grains each day.  Less than 6 oz of lean meat, poultry, or fish each day. A 3-oz serving of meat is about the same size as a deck of cards. One egg equals 1 oz.  2 servings of low-fat dairy each day.  A serving of nuts, seeds, or beans 5 times each week.  Heart-healthy fats. Healthy fats called Omega-3 fatty acids are found in foods such as flaxseeds and coldwater fish, like sardines, salmon, and mackerel.  Limit how much you eat of the following:  Canned or prepackaged foods.  Food that is high in trans fat, such as fried foods.  Food that is high in saturated fat, such as fatty meat.  Sweets, desserts, sugary drinks, and other foods with added sugar.  Full-fat dairy products.  Do not salt foods before eating.  Try to eat at least 2 vegetarian meals each week.  Eat more home-cooked food and less restaurant, buffet, and fast food.  When eating at a restaurant, ask that your food be prepared with less salt or no salt, if possible. What foods are recommended? The items listed may not be a complete list. Talk  with your dietitian about what dietary choices are best for you. Grains  Whole-grain or whole-wheat bread. Whole-grain or whole-wheat pasta. Brown rice. [place]. Bulgur. Whole-grain and low-sodium cereals. Pita bread. Low-fat, low-sodium crackers. Whole-wheat flour tortillas. Vegetables  Fresh or frozen vegetables (raw, steamed, roasted, or grilled). Low-sodium or reduced-sodium tomato and vegetable juice. Low-sodium or reduced-sodium tomato sauce and tomato paste. Low-sodium or reduced-sodium canned vegetables. Fruits  All fresh, dried, or frozen fruit. Canned fruit in natural juice  (without added sugar). Meat and other protein foods  Skinless chicken or [place]. Ground chicken or [place]. Pork with fat trimmed off. Fish and seafood. Egg whites. Dried beans, peas, or lentils. Unsalted nuts, nut butters, and seeds. Unsalted canned beans. Lean cuts of beef with fat trimmed off. Low-sodium, lean deli meat. Dairy  Low-fat (1%) or fat-free (skim) milk. Fat-free, low-fat, or reduced-fat cheeses. Nonfat, low-sodium ricotta or cottage cheese. Low-fat or nonfat yogurt. Low-fat, low-sodium cheese. Fats and oils  Soft margarine without trans fats. Vegetable oil. Low-fat, reduced-fat, or light mayonnaise and salad dressings (reduced-sodium). Canola, safflower, olive, soybean, and sunflower oils. Avocado. Seasoning and other foods  Herbs. Spices. Seasoning mixes without salt. Unsalted popcorn and pretzels. Fat-free sweets. What foods are not recommended? The items listed may not be a complete list. Talk with your dietitian about what dietary choices are best for you. Grains  Baked goods made with fat, such as croissants, muffins, or some breads. Dry pasta or rice meal packs. Vegetables  Creamed or fried vegetables. Vegetables in a cheese sauce. Regular canned vegetables (not low-sodium or reduced-sodium). Regular canned tomato sauce and paste (not low-sodium or reduced-sodium). Regular tomato and vegetable juice (not low-sodium or reduced-sodium). Angie Fava. Olives. Fruits  Canned fruit in a light or heavy syrup. Fried fruit. Fruit in cream or butter sauce. Meat and other protein foods  Fatty cuts of meat. Ribs. Fried meat. Berniece Salines. Sausage. Bologna and other processed lunch meats. Salami. Fatback. Hotdogs. Bratwurst. Salted nuts and seeds. Canned beans with added salt. Canned or smoked fish. Whole eggs or egg yolks. Chicken or [place] with skin. Dairy  Whole or 2% milk, cream, and half-and-half. Whole or full-fat cream cheese. Whole-fat or sweetened yogurt. Full-fat cheese. Nondairy creamers.  Whipped toppings. Processed cheese and cheese spreads. Fats and oils  Butter. Stick margarine. Lard. Shortening. Ghee. Bacon fat. Tropical oils, such as coconut, palm kernel, or palm oil. Seasoning and other foods  Salted popcorn and pretzels. Onion salt, garlic salt, seasoned salt, table salt, and sea salt. Worcestershire sauce. Tartar sauce. Barbecue sauce. Teriyaki sauce. Soy sauce, including reduced-sodium. Steak sauce. Canned and packaged gravies. Fish sauce. Oyster sauce. Cocktail sauce. Horseradish that you find on the shelf. Ketchup. Mustard. Meat flavorings and tenderizers. Bouillon cubes. Hot sauce and Tabasco sauce. Premade or packaged marinades. Premade or packaged taco seasonings. Relishes. Regular salad dressings. Where to find more information:  National Heart, Lung, and [place]: https://wilson-eaton.com/  American Heart Association: www.heart.org Summary  The DASH eating plan is a healthy eating plan that has been shown to reduce high blood pressure (hypertension). It may also reduce your risk for type 2 diabetes, heart disease, and stroke.  With the DASH eating plan, you should limit salt (sodium) intake to 2,300 mg a day. If you have hypertension, you may need to reduce your sodium intake to 1,500 mg a day.  When on the DASH eating plan, aim to eat more fresh fruits and vegetables, whole grains, lean proteins, low-fat dairy, and heart-healthy fats.  Work  with your health care provider or diet and nutrition specialist (dietitian) to adjust your eating plan to your individual calorie needs. This information is not intended to replace advice given to you by your health care provider. Make sure you discuss any questions you have with your health care provider. Document Released: 09/06/2011 Document Revised: 09/10/2016 Document Reviewed: 09/10/2016 Elsevier Interactive Patient Education  2017 [place].    Heart Failure Heart failure is a condition in which the heart has  trouble pumping blood because it has become weak or stiff. This means that the heart does not pump blood efficiently for the body to work well. For some people with heart failure, fluid may back up into the lungs and there may be swelling (edema) in the lower legs. Heart failure is usually a long-term (chronic) condition. It is important for you to take good care of yourself and follow the treatment plan from your health care provider. What are the causes? This condition is caused by some health problems, including:  High blood pressure (hypertension). Hypertension causes the heart muscle to work harder than normal. High blood pressure eventually causes the heart to become stiff and weak.  Coronary artery disease (CAD). CAD is the buildup of cholesterol and fat (plaques) in the arteries of the heart.  Heart attack (myocardial infarction). Injured tissue, which is caused by the heart attack, does not contract as well and the heart's ability to pump blood is weakened.  Abnormal heart valves. When the heart valves do not open and close properly, the heart muscle must pump harder to keep the blood flowing.  Heart muscle disease (cardiomyopathy or myocarditis). Heart muscle disease is damage to the heart muscle from a variety of causes, such as drug or alcohol abuse, infections, or unknown causes. These can increase the risk of heart failure.  Lung disease. When the lungs do not work properly, the heart must work harder. What increases the risk? Risk of heart failure increases as a person ages. This condition is also more likely to develop in people who:  Are overweight.  Are male.  Smoke or chew tobacco.  Abuse alcohol or illegal drugs.  Have taken medicines that can damage the heart, such as chemotherapy drugs.  Have diabetes.  High blood sugar (glucose) is associated with high fat (lipid) levels in the blood.  Diabetes can also damage tiny blood vessels that carry nutrients to the heart  muscle.  Have abnormal heart rhythms.  Have thyroid problems.  Have low blood counts (anemia). What are the signs or symptoms? Symptoms of this condition include:  Shortness of breath with activity, such as when climbing stairs.  Persistent cough.  Swelling of the feet, ankles, legs, or abdomen.  Unexplained weight gain.  Difficulty breathing when lying flat (orthopnea).  Waking from sleep because of the need to sit up and get more air.  Rapid heartbeat.  Fatigue and loss of energy.  Feeling light-headed, dizzy, or close to fainting.  Loss of appetite.  Nausea.  Increased urination during the night (nocturia).  Confusion. How is this diagnosed? This condition is diagnosed based on:  Medical history, symptoms, and a physical exam.  Diagnostic tests, which may include:  Echocardiogram.  Electrocardiogram (ECG).  Chest X-ray.  Blood tests.  Exercise stress test.  Radionuclide scans.  Cardiac catheterization and angiogram. How is this treated? Treatment for this condition is aimed at managing the symptoms of heart failure. Medicines, behavioral changes, or other treatments may be necessary to treat heart failure.  Medicines  These may include:  Angiotensin-converting enzyme (ACE) inhibitors. This type of medicine blocks the effects of a blood protein called angiotensin-converting enzyme. ACE inhibitors relax (dilate) the blood vessels and help to lower blood pressure.  Angiotensin receptor blockers (ARBs). This type of medicine blocks the actions of a blood protein called angiotensin. ARBs dilate the blood vessels and help to lower blood pressure.  Water pills (diuretics). Diuretics cause the kidneys to remove salt and water from the blood. The extra fluid is removed through urination, leaving a lower volume of blood that the heart has to pump.  Beta blockers. These improve heart muscle strength and they prevent the heart from beating too  quickly.  Digoxin. This increases the force of the heartbeat. Healthy behavior changes  These may include:  Reaching and maintaining a healthy weight.  Stopping smoking or chewing tobacco.  Eating heart-healthy foods.  Limiting or avoiding alcohol.  Stopping use of street drugs (illegal drugs).  Physical activity. Other treatments  These may include:  Surgery to open blocked coronary arteries or repair damaged heart valves.  Placement of a biventricular pacemaker to improve heart muscle function (cardiac resynchronization therapy). This device paces both the right ventricle and left ventricle.  Placement of a device to treat serious abnormal heart rhythms (implantable cardioverter defibrillator, or ICD).  Placement of a device to improve the pumping ability of the heart (left ventricular assist device, or LVAD).  Heart transplant. This can cure heart failure, and it is considered for certain patients who do not improve with other therapies. Follow these instructions at home: Medicines   Take over-the-counter and prescription medicines only as told by your health care provider. Medicines are important in reducing the workload of your heart, slowing the progression of heart failure, and improving your symptoms.  Do not stop taking your medicine unless your health care provider told you to do that.  Do not skip any dose of medicine.  Refill your prescriptions before you run out of medicine. You need your medicines every day. Eating and drinking    Eat heart-healthy foods. Talk with a dietitian to make an eating plan that is right for you.  Choose foods that contain no trans fat and are low in saturated fat and cholesterol. Healthy choices include fresh or frozen fruits and vegetables, fish, lean meats, legumes, fat-free or low-fat dairy products, and whole-grain or high-fiber foods.  Limit salt (sodium) if directed by your health care provider. Sodium restriction may reduce  symptoms of heart failure. Ask a dietitian to recommend heart-healthy seasonings.  Use healthy cooking methods instead of frying. Healthy methods include roasting, grilling, broiling, baking, poaching, steaming, and stir-frying.  Limit your fluid intake if directed by your health care provider. Fluid restriction may reduce symptoms of heart failure. Lifestyle   Stop smoking or using chewing tobacco. Nicotine and tobacco can damage your heart and your blood vessels. Do not use nicotine gum or patches before talking to your health care provider.  Limit alcohol intake to no more than 1 drink per day for non-pregnant women and 2 drinks per day for men. One drink equals 12 oz of beer, 5 oz of wine, or 1 oz of hard liquor.  Drinking more than that is harmful to your heart. Tell your health care provider if you drink alcohol several times a week.  Talk with your health care provider about whether any level of alcohol use is safe for you.  If your heart has already been damaged by  alcohol or you have severe heart failure, drinking alcohol should be stopped completely.  Stop use of illegal drugs.  Lose weight if directed by your health care provider. Weight loss may reduce symptoms of heart failure.  Do moderate physical activity if directed by your health care provider. People who are elderly and people with severe heart failure should consult with a health care provider for physical activity recommendations. Monitor important information   Weigh yourself every day. Keeping track of your weight daily helps you to notice excess fluid sooner.  Weigh yourself every morning after you urinate and before you eat breakfast.  Wear the same amount of clothing each time you weigh yourself.  Record your daily weight. Provide your health care provider with your weight record.  Monitor and record your blood pressure as told by your health care provider.  Check your pulse as told by your health care  provider. Dealing with extreme temperatures   If the weather is extremely hot:  Avoid vigorous physical activity.  Use air conditioning or fans or seek a cooler location.  Avoid caffeine and alcohol.  Wear loose-fitting, lightweight, and light-colored clothing.  If the weather is extremely cold:  Avoid vigorous physical activity.  Layer your clothes.  Wear mittens or gloves, a hat, and a scarf when you go outside.  Avoid alcohol. General instructions   Manage other health conditions such as hypertension, diabetes, thyroid disease, or abnormal heart rhythms as told by your health care provider.  Learn to manage stress. If you need help to do this, ask your health care provider.  Plan rest periods when fatigued.  Get ongoing education and support as needed.  Participate in or seek rehabilitation as needed to maintain or improve independence and quality of life.  Stay up to date with immunizations. Keeping current on pneumococcal and influenza immunizations is especially important to prevent respiratory infections.  Keep all follow-up visits as told by your health care provider. This is important. Contact a health care provider if:  You have a rapid weight gain.  You have increasing shortness of breath that is unusual for you.  You are unable to participate in your usual physical activities.  You tire easily.  You cough more than normal, especially with physical activity.  You have any swelling or more swelling in areas such as your hands, feet, ankles, or abdomen.  You are unable to sleep because it is hard to breathe.  You feel like your heart is beating quickly (palpitations).  You become dizzy or light-headed when you stand up. Get help right away if:  You have difficulty breathing.  You notice or your family notices a change in your awareness, such as having trouble staying awake or having difficulty with concentration.  You have pain or discomfort in  your chest.  You have an episode of fainting (syncope). This information is not intended to replace advice given to you by your health care provider. Make sure you discuss any questions you have with your health care provider. Document Released: 09/17/2005 Document Revised: 05/22/2016 Document Reviewed: 04/11/2016 Elsevier Interactive Patient Education  2017 [employer].

## 2018-02-24 DIAGNOSIS — I701 Atherosclerosis of renal artery: Secondary | ICD-10-CM | POA: Diagnosis present

## 2018-02-24 NOTE — H&P (Signed)
OFFICE VISIT NOTES COPIED TO EPIC FOR DOCUMENTATION  . History of Present Illness Michael Page MD; 02/06/2018 6:23 AM) Patient words: 4 week f/u for renal duplex; Last OV 01/01/2018.  The patient is a 77 year old male who presents for a Follow-up for Hypertension.  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 He has very mild claudication both legs and asymptomatic carotid stenosis being followed annually. Presents for f/u of resistant hypertension.  Echocardiogram on 02/13/2017 showed moderate LVH, LVEF 56%. Nuclear stress test in March 2018 showed frequent PVCs, soft tissue attenuation artifact without demonstratable ischemia or scar. LVEF was moderately depressed and calculated at 39% however visually appeared to be mildly depressed. Medical mangaement was recommended. Since being on medicaitons, symptoms of chest pain and shortness of breath have greatly improved. Has chronic mild leg edema.  Presents for f/u of difficult to control hypertension. He was started on BiDil on his last OV and states that he is tolerating this. Doing well, without any complaints today.   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 Renal length is within normal limits for both kidneys. Normal  abdominal aorta flow velocities noted. There is atherosclerotic changes in the abdominal aorta. Bilateral carotid bruits (R09.89)  Carotid artery duplex 12/26/2017: Mild stenosis in the right internal carotid artery (1-15%). Mild stenosis in the right external carotid artery (<50%). Moderate stenosis in the left internal carotid artery (16-49%). Mild stenosis in the left external carotid artery (<50%). Antegrade right vertebral artery flow. Antegrade left vertebral artery flow. Compared to the study done on 12/26/2016, no significant change. Follow up in one year is appropriate if clinically indicated. Claudication, intermittent (I73.9)  ABI 05/23/2016: Bilateral ABI indicates mild arterial disease with monophasic waveform in the anterior tibial and biphasic waveform the posterior tibial vessels. Frequent PVCs (I49.3)  Lexiscan myoview stress test 12/24/2016: 1. Resting EKG demonstrated normal sinus rhythm, frequent PVCs in the form of ventricular bigeminy. Stress EKG is nondiagnostic for ischemia as a pharmacologic stress test. Stress symptoms included dyspnea. 2. Perfusion images reveal soft tissue attenuation artifact in the inferolateral regions without demonstrable ischemia or scar. Left ventricle is dilated at 174 both at rest and stress images. LVEF moderately depressed and calculated at 39%, however visually appears to be mildly depressed. EF calculation may be an error due to difficulty in ECG gating due to frequent PVCs. This represents a intermediate risk study, clinical correlation is recommended. Dyspnea on exertion (R06.09)  Echocardiogram 02/13/2017: Left ventricle cavity is normal in size. Moderate concentric hypertrophy of the left ventricle. Normal global wall motion. Indeterminate diastolic filling pattern, elevated LAP. Calculated EF 56%. Left atrial cavity is mildly dilated at 4.0 cm. Mild (Grade I) mitral regurgitation. Moderate calcification of the mitral valve annulus. Mild mitral  valve leaflet calcification. Mildly restricted mitral valve leaflets. Trace mitral stenosis, peak gradient 7.25 and mean gradient 1.94 mmHg, mitral valve area 1.1 cm. Pressure halftime mitral valve area 1.31  cm. Trace tricuspid regurgitation. No evidence of pulmonary hypertension. Overall No significant change from Hospital echocardiogram 12/06/2013. Labwork  Labs from PCP 11/18/2017: Glucose 166, creatinine 3.12, EGFR 22, potassium 4.3, CMP otherwise normal. Hemoglobin A1c 8.2%. BNP 178. Labs 12/10/2016: BUN 20, creatinine 2.72, eGFR 22 mL. Total cholesterol 139, triglycerides 137, HDL 54, LDL 158. A1c 6. and percent. Hemoglobin 10.2/hematocrit 31.6, RDW 17.5, normal indicis. Platelets 267. B12, ferritin and serum erythropoietin levels normal limits. Labs 11/22/2016: BUN 11, creatinine 1.8, eGFR 34 mL. HB 10.5/HCT 32.5, mild microcytic indicis. 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 at age 69 from [place]; No known Heart conditions Father  Deceased. at age 77 from [place]; No known Heart conditions Sister 4  2-Deceased; 2-Younger (twins); No known Heart conditions Brother 1  Deceased. from Brain, Lung Cancer; No known Heart conditions  Social History (April Harrington; 02-09-2018 9:57 AM) Current tobacco use  Light tobacco smoker. 1-2 Cigarettes occasionally Alcohol Use  Occasional alcohol use. Marital status  Married. Number of Children  2. Living Situation  Lives with spouse. Non Drinker/No Alcohol Use   Past Surgical History (April Harrington; 2018/02/09 9:52 AM) Tonsillectomy  As a child  Medication History (April Harrington; Feb 09, 2018 9:58 AM) BiDil (20-37.5MG Tablet, 2 (two) Tablet Oral three times daily, Taken starting 01/24/2018) Active. amLODIPine Besylate (10MG Tablet, 1 (one) Tablet Oral daily,  Taken starting 01/14/2018) Active. Ferrous Sulfate (325 (65 Fe)MG Tablet, 1 Tablet Tablet Oral two times daily, Taken starting 12/27/2017) Active. Rosuvastatin Calcium (10MG Tablet, 1 Tablet Tablet Tablet Oral daily, Taken starting 04/18/2017) Active. Carvedilol (6.25MG Tablet, 1 Tablet Tablet Tablet Oral two times daily, Taken starting 03/18/2017) Active. Aspirin EC (81MG Tablet DR, 1 Oral daily) Active. CloNIDine HCl (0.1MG Tablet, 1 Oral two times daily) Active. RaNITidine HCl (150MG Capsule, 1 Oral daily) Active. Multiple Vitamin (1 (one) Oral daily) Active. Fish Oil (1000MG Capsule, 1 Oral daily) Active. Medications Reconciled (verbally with pt; no list or medication present)  Diagnostic Studies History (April Harrington; 02/09/2018 9:53 AM) Renal Dopplers  01/29/2018: Hemodynamically significant stenosis bilaterally. Renal length is within normal limits for both kidneys. Normal abdominal aorta flow velocities noted. There is atherosclerotic changes in the abdominal aorta. Echocardiogram [02/13/2017]: Left ventricle cavity is normal in size. Moderate concentric hypertrophy of the left ventricle. Normal global wall motion. Indeterminate diastolic filling pattern, elevated LAP. Calculated EF 56%. Left atrial cavity is mildly dilated at 4.0 cm. Mild (Grade I) mitral regurgitation. Moderate calcification of the mitral valve annulus. Mild mitral valve leaflet calcification. Mildly restricted mitral valve leaflets. Trace mitral stenosis, peak gradient 7.25 and mean gradient 1.94 mmHg, mitral valve area 1.1 cm. Pressure halftime mitral valve area 1.31 cm. Trace tricuspid regurgitation. No evidence of pulmonary hypertension. Nuclear stress test [12/24/2016]: 1. Resting EKG demonstrated normal sinus rhythm, frequent PVCs in the form of ventricular bigeminy. Stress EKG is nondiagnostic for ischemia as a pharmacologic stress test. Stress symptoms included dyspnea. 2. Perfusion images  reveal soft tissue attenuation artifact in the inferolateral regions without demonstrable ischemia or scar. Left ventricle is dilated at 174 both at rest and stress images. LVEF moderately depressed and calculated at 39%, however visually appears to be mildly depressed. EF calculation may be an error due to difficulty in ECG gating due to frequent PVCs. This represents a intermediate risk study, clinical correlation is recommended.    Review of Systems (  Michael Page, MD; 02/06/2018 6:20 AM) General Not Present- Appetite Loss and Weight Gain. Respiratory Present- Decreased Exercise Tolerance and Difficulty Breathing on Exertion. Not Present- Chronic Cough and Wakes up from Sleep Wheezing or Short of Breath. Cardiovascular Present- Claudications (stable with extreme exertion) and Difficulty Breathing On Exertion (stable with extreme exertion). Not Present- Calf Cramps, Chest Pain and Edema. Gastrointestinal Not Present- Black, Tarry Stool and Difficulty Swallowing. Musculoskeletal Not Present- Decreased Range of Motion and Muscle Atrophy. Neurological Not Present- Attention Deficit. Psychiatric Not Present- Personality Changes and Suicidal Ideation. Endocrine Not Present- Cold Intolerance and Heat Intolerance. Hematology Not Present- Abnormal Bleeding. All other systems negative  Vitals (April Harrington; 02/05/2018 9:59 AM) 02/05/2018 9:55 AM Weight: 217.44 lb Height: 69in Body Surface Area: 2.14 m Body Mass Index: 32.11 kg/m  Pulse: 71 (Regular)  P.OX: 98% (Room air) BP: 120/62 (Sitting, Left Arm, Standard)       Physical Exam Michael Page, MD; 02/06/2018 6:20 AM) General Mental Status-Alert. General Appearance-Cooperative and Appears stated age. Build & Nutrition-Well built and Mildly obese.  Head and Neck Thyroid Gland Characteristics - normal size and consistency and no palpable nodules.  Chest and Lung Exam Chest and lung exam reveals -quiet,  even and easy respiratory effort with no use of accessory muscles, non-tender and on auscultation, normal breath sounds, no adventitious sounds.  Cardiovascular Auscultation Rhythm - Frequent Ectopy. Heart Sounds - Normal heart sounds(distant heart sounds). Murmurs & Other Heart Sounds: Murmur - Location - Apex. Timing - Early systolic. Grade - II/VI.  Abdomen Palpation/Percussion Normal exam - Non Tender and No hepatosplenomegaly.  Peripheral Vascular Lower Extremity Inspection - Bilateral - Inspection Normal. Palpation - Edema - Bilateral - 1+ Pitting edema. Femoral pulse - Bilateral - Normal(soft bruit left). Popliteal pulse - Bilateral - Absent. Dorsalis pedis pulse - Bilateral - Absent. Posterior tibial pulse - Bilateral - Absent. Carotid arteries - Bilateral-Soft Bruit.  Neurologic Neurologic evaluation reveals -alert and oriented x 3 with no impairment of recent or remote memory. Motor-Grossly intact without any focal deficits.  Musculoskeletal Global Assessment Left Lower Extremity - no deformities, masses or tenderness, no known fractures. Right Lower Extremity - no deformities, masses or tenderness, no known fractures.  Assessment & Plan Michael Page MD; 02/06/2018 6:25 AM) Dyspnea on exertion (R06.09) Story: Echocardiogram 02/13/2017: Left ventricle cavity is normal in size. Moderate concentric hypertrophy of the left ventricle. Normal global wall motion. Indeterminate diastolic filling pattern, elevated LAP. Calculated EF 56%. Left atrial cavity is mildly dilated at 4.0 cm. Mild (Grade I) mitral regurgitation. Moderate calcification of the mitral valve annulus. Mild mitral valve leaflet calcification. Mildly restricted mitral valve leaflets. Trace mitral stenosis, peak gradient 7.25 and mean gradient 1.94 mmHg, mitral valve area 1.1 cm. Pressure halftime mitral valve area 1.31 cm. Trace tricuspid regurgitation. No evidence of pulmonary hypertension. Overall No  significant change from Hospital echocardiogram 12/06/2013. Chronic diastolic heart failure (N19.16) Diabetes mellitus with stage 4 chronic kidney disease (E11.22) Resistant hypertension (I10) Story: Renal artery duplex 01/29/2018: Hemodynamically significant stenosis bilaterally. Renal length is within normal limits for both kidneys. Normal abdominal aorta flow velocities noted. There is atherosclerotic changes in the abdominal aorta. Impression: EKG 01/01/2018: Normal sinus rhythm at rate of 63 bpm, normal axis. No evidence of ischemia, normal EKG. No significant change from EKG 06/14/2017, PVCs 2 in number not present.   Labwork 02/14/2018: Creatinine 3.15, EGFR 18/21, potassium 4.4, BMP otherwise normal.  RBC 3.51, hemoglobin 9.6, hematocrit 29.5, RDW 16.6%, CBC otherwise  normal.  Labs from PCP 11/18/2017: Glucose 166, creatinine 3.12, EGFR 22, potassium 4.3, CMP otherwise normal. Hemoglobin A1c 8.2%. BNP 178.  Labs 12/10/2016: BUN 20, creatinine 2.72, eGFR 22 mL. Total cholesterol 139, triglycerides 137, HDL 54, LDL 158. A1c 6. and percent. Hemoglobin 10.2/hematocrit 31.6, RDW 17.5, normal indicis. Platelets 267. B12, ferritin and serum erythropoietin levels normal limits.  Labs 11/22/2016: BUN 11, creatinine 1.8, eGFR 34 mL. HB 10.5/HCT 32.5, mild microcytic indicis.  Note:. 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 He has very mild claudication both legs and asymptomatic carotid stenosis being followed annually. Presents for f/u of resistant hypertension.   Patient presents for 6 week follow-up visit for hypertension f/u and also for dyspnea on exertion and chronic diastolic heart failure. Patient is presently doing well, no  complaints today. Claudication symptoms have remained mild and are only present with extreme long-distance walking.  Left renal artery stenosis appears to be much more significant for the velocity of 310/37 cm/s however the are: A ratio is 2.52. On the right are: A ratio is 1.61. He has stage IV CKD, he may benefit from renal angiography for renal preservation with CO2 and will try to avoid contrast exposure. Fortunately renal length is normal. Although previously blood pressure has been difficult to control, with initiation of BiDil, her pressure is now well controlled. Dyspnea is stable related to obesity, hypertensive heart disease with chronic diastolic CHF. I have extensively discussed with the patient regarding particularly renal failure leading to permanent dialysis is a major complications of the procedure but not limited to this including less than 1% risk of bleeding complications,, need for blood transfusion.  CC: Dr. Kevan Ny    Signed by Michael Page, MD (02/06/2018 6:26 AM)

## 2018-02-25 ENCOUNTER — Encounter (HOSPITAL_COMMUNITY)
Admission: RE | Disposition: A | Discharge status: Home / Self Care | Payer: Self-pay | Source: Ambulatory Visit | Attending: Cardiology

## 2018-02-25 ENCOUNTER — Ambulatory Visit (HOSPITAL_COMMUNITY)
Admission: RE | Admit: 2018-02-25 | Discharge: 2018-02-25 | Disposition: A | Discharge status: Home / Self Care | Payer: Medicare Other | Source: Ambulatory Visit | Attending: Cardiology | Admitting: Cardiology

## 2018-02-25 DIAGNOSIS — Z7982 Long term (current) use of aspirin: Secondary | ICD-10-CM | POA: Insufficient documentation

## 2018-02-25 DIAGNOSIS — I129 Hypertensive chronic kidney disease with stage 1 through stage 4 chronic kidney disease, or unspecified chronic kidney disease: Secondary | ICD-10-CM | POA: Diagnosis not present

## 2018-02-25 DIAGNOSIS — I739 Peripheral vascular disease, unspecified: Secondary | ICD-10-CM | POA: Diagnosis not present

## 2018-02-25 DIAGNOSIS — Z923 Personal history of irradiation: Secondary | ICD-10-CM | POA: Insufficient documentation

## 2018-02-25 DIAGNOSIS — E7800 Pure hypercholesterolemia, unspecified: Secondary | ICD-10-CM | POA: Diagnosis not present

## 2018-02-25 DIAGNOSIS — I5032 Chronic diastolic (congestive) heart failure: Secondary | ICD-10-CM | POA: Insufficient documentation

## 2018-02-25 DIAGNOSIS — I130 Hypertensive heart and chronic kidney disease with heart failure and stage 1 through stage 4 chronic kidney disease, or unspecified chronic kidney disease: Secondary | ICD-10-CM | POA: Insufficient documentation

## 2018-02-25 DIAGNOSIS — E1122 Type 2 diabetes mellitus with diabetic chronic kidney disease: Secondary | ICD-10-CM | POA: Diagnosis not present

## 2018-02-25 DIAGNOSIS — N184 Chronic kidney disease, stage 4 (severe): Secondary | ICD-10-CM | POA: Diagnosis not present

## 2018-02-25 DIAGNOSIS — F17210 Nicotine dependence, cigarettes, uncomplicated: Secondary | ICD-10-CM | POA: Diagnosis not present

## 2018-02-25 DIAGNOSIS — I050 Rheumatic mitral stenosis: Secondary | ICD-10-CM | POA: Insufficient documentation

## 2018-02-25 DIAGNOSIS — I701 Atherosclerosis of renal artery: Secondary | ICD-10-CM | POA: Diagnosis present

## 2018-02-25 DIAGNOSIS — G4733 Obstructive sleep apnea (adult) (pediatric): Secondary | ICD-10-CM | POA: Insufficient documentation

## 2018-02-25 DIAGNOSIS — I700 Atherosclerosis of aorta: Secondary | ICD-10-CM | POA: Insufficient documentation

## 2018-02-25 HISTORY — PX: RENAL ANGIOGRAPHY: CATH118260

## 2018-02-25 HISTORY — PX: LOWER EXTREMITY ANGIOGRAPHY: CATH118251

## 2018-02-25 LAB — PROTIME-INR
INR: 1.04
PROTHROMBIN TIME: 13.5 s (ref 11.4–15.2)

## 2018-02-25 LAB — GLUCOSE, CAPILLARY
GLUCOSE-CAPILLARY: 119 mg/dL — AB (ref 65–99)
Glucose-Capillary: 112 mg/dL — ABNORMAL HIGH (ref 65–99)
Glucose-Capillary: 106 mg/dL — ABNORMAL HIGH (ref 65–99)

## 2018-02-25 SURGERY — RENAL ANGIOGRAPHY
Anesthesia: LOCAL

## 2018-02-25 MED ORDER — SODIUM CHLORIDE 0.9% FLUSH: 3.0000 mL | INTRAVENOUS | Status: DC | PRN

## 2018-02-25 MED ORDER — LIDOCAINE HCL (PF) 1 % IJ SOLN
INTRAMUSCULAR | Status: AC
  Filled 2018-02-25: qty 30

## 2018-02-25 MED ORDER — CARVEDILOL 6.25 MG PO TABS
6.2500 mg | ORAL_TABLET | Freq: Two times a day (BID) | ORAL | Status: DC
  Administered 2018-02-25: 6.25 mg via ORAL
  Filled 2018-02-25: qty 1

## 2018-02-25 MED ORDER — AMLODIPINE BESYLATE 10 MG PO TABS
10.0000 mg | ORAL_TABLET | Freq: Every day | ORAL | Status: DC
  Administered 2018-02-25: 10 mg via ORAL
  Filled 2018-02-25: qty 1

## 2018-02-25 MED ORDER — LIDOCAINE HCL (PF) 1 % IJ SOLN
INTRAMUSCULAR | Status: DC | PRN
  Administered 2018-02-25 (×2): 15 mL via INTRADERMAL

## 2018-02-25 MED ORDER — FUROSEMIDE 40 MG PO TABS: 60.0000 mg | ORAL_TABLET | Freq: Two times a day (BID) | ORAL | Status: DC

## 2018-02-25 MED ORDER — AMLODIPINE BESYLATE 5 MG PO TABS
ORAL_TABLET | ORAL | Status: AC
  Filled 2018-02-25: qty 2

## 2018-02-25 MED ORDER — ACETAMINOPHEN 325 MG PO TABS: 650.0000 mg | ORAL_TABLET | ORAL | Status: DC | PRN

## 2018-02-25 MED ORDER — POTASSIUM CHLORIDE CRYS ER 20 MEQ PO TBCR: 20.0000 meq | EXTENDED_RELEASE_TABLET | Freq: Every day | ORAL | Status: DC

## 2018-02-25 MED ORDER — FERROUS SULFATE 325 (65 FE) MG PO TABS: 325.0000 mg | ORAL_TABLET | Freq: Two times a day (BID) | ORAL | Status: DC

## 2018-02-25 MED ORDER — LABETALOL HCL 5 MG/ML IV SOLN
10.0000 mg | INTRAVENOUS | Status: DC | PRN
  Administered 2018-02-25 (×2): 10 mg via INTRAVENOUS

## 2018-02-25 MED ORDER — HEPARIN (PORCINE) IN NACL 2-0.9 UNITS/ML
INTRAMUSCULAR | Status: AC | PRN
  Administered 2018-02-25 (×2): 500 mL

## 2018-02-25 MED ORDER — SODIUM CHLORIDE 0.9% FLUSH: 3.0000 mL | Freq: Two times a day (BID) | INTRAVENOUS | Status: DC

## 2018-02-25 MED ORDER — ISOSORB DINITRATE-HYDRALAZINE 20-37.5 MG PO TABS
2.0000 | ORAL_TABLET | Freq: Three times a day (TID) | ORAL | Status: DC
  Administered 2018-02-25: 2 via ORAL
  Filled 2018-02-25: qty 2

## 2018-02-25 MED ORDER — CLONIDINE HCL 0.1 MG PO TABS
0.1000 mg | ORAL_TABLET | Freq: Two times a day (BID) | ORAL | Status: DC
  Administered 2018-02-25: 0.1 mg via ORAL
  Filled 2018-02-25: qty 1

## 2018-02-25 MED ORDER — FENTANYL CITRATE (PF) 100 MCG/2ML IJ SOLN
INTRAMUSCULAR | Status: AC
  Filled 2018-02-25: qty 2

## 2018-02-25 MED ORDER — MIDAZOLAM HCL 2 MG/2ML IJ SOLN
INTRAMUSCULAR | Status: AC
  Filled 2018-02-25: qty 2

## 2018-02-25 MED ORDER — SODIUM CHLORIDE 0.9 % IV SOLN
INTRAVENOUS | Status: DC
  Administered 2018-02-25: 150 mL/h via INTRAVENOUS

## 2018-02-25 MED ORDER — ROSUVASTATIN CALCIUM 10 MG PO TABS: 10.0000 mg | ORAL_TABLET | Freq: Every day | ORAL | Status: DC

## 2018-02-25 MED ORDER — SODIUM CHLORIDE 0.9 % IV SOLN: 250.0000 mL | INTRAVENOUS | Status: DC | PRN

## 2018-02-25 MED ORDER — ASPIRIN 81 MG PO TABS: 81.0000 mg | ORAL_TABLET | Freq: Every day | ORAL | Status: DC

## 2018-02-25 MED ORDER — LABETALOL HCL 5 MG/ML IV SOLN
INTRAVENOUS | Status: AC
  Filled 2018-02-25: qty 4

## 2018-02-25 MED ORDER — HYDRALAZINE HCL 20 MG/ML IJ SOLN
INTRAMUSCULAR | Status: DC | PRN
  Administered 2018-02-25 (×2): 10 mg via INTRAVENOUS

## 2018-02-25 MED ORDER — HYDRALAZINE HCL 20 MG/ML IJ SOLN: 5.0000 mg | INTRAMUSCULAR | Status: DC | PRN

## 2018-02-25 MED ORDER — HEPARIN (PORCINE) IN NACL 1000-0.9 UT/500ML-% IV SOLN
INTRAVENOUS | Status: AC
  Filled 2018-02-25: qty 1000

## 2018-02-25 MED ORDER — MIDAZOLAM HCL 2 MG/2ML IJ SOLN
INTRAMUSCULAR | Status: DC | PRN
  Administered 2018-02-25: 1 mg via INTRAVENOUS

## 2018-02-25 MED ORDER — FENTANYL CITRATE (PF) 100 MCG/2ML IJ SOLN
INTRAMUSCULAR | Status: DC | PRN
  Administered 2018-02-25: 50 ug via INTRAVENOUS

## 2018-02-25 MED ORDER — ONDANSETRON HCL 4 MG/2ML IJ SOLN: 4.0000 mg | Freq: Four times a day (QID) | INTRAMUSCULAR | Status: DC | PRN

## 2018-02-25 MED ORDER — TAMSULOSIN HCL 0.4 MG PO CAPS: 0.4000 mg | ORAL_CAPSULE | Freq: Every day | ORAL | Status: DC

## 2018-02-25 MED ORDER — SODIUM CHLORIDE 0.9 % IV BOLUS
500.0000 mL | Freq: Once | INTRAVENOUS | Status: AC
  Administered 2018-02-25: 500 mL via INTRAVENOUS

## 2018-02-25 MED ORDER — HYDRALAZINE HCL 20 MG/ML IJ SOLN
INTRAMUSCULAR | Status: AC
  Filled 2018-02-25: qty 1

## 2018-02-25 SURGICAL SUPPLY — 11 items
CATH ANGIO 5F BER2 65CM (CATHETERS) ×3 IMPLANT
CATH OMNI FLUSH 5F 65CM (CATHETERS) ×3 IMPLANT
CATH TEMPO 5F RIM 65CM (CATHETERS) ×3 IMPLANT
COVER PRB 48X5XTLSCP FOLD TPE (BAG) ×2 IMPLANT
COVER PROBE 5X48 (BAG) ×1
KIT MICROPUNCTURE NIT STIFF (SHEATH) ×3 IMPLANT
KIT PV (KITS) ×3 IMPLANT
SHEATH PINNACLE 6F 10CM (SHEATH) ×3 IMPLANT
TRANSDUCER W/STOPCOCK (MISCELLANEOUS) ×3 IMPLANT
TRAY PV CATH (CUSTOM PROCEDURE TRAY) ×3 IMPLANT
WIRE BENTSON .035X145CM (WIRE) ×3 IMPLANT

## 2018-02-25 NOTE — Discharge Instructions (Signed)
**Note Michael Blackwell-identified via Obfuscation** Femoral Site Care °Refer to this sheet in the next few weeks. These instructions provide you with information about caring for yourself after your procedure. Your health care provider may also give you more specific instructions. Your treatment has been planned according to current medical practices, but problems sometimes occur. Call your health care provider if you have any problems or questions after your procedure. °What can I expect after the procedure? °After your procedure, it is typical to have the following: °· Bruising at the site that usually fades within 1-2 weeks. °· Blood collecting in the tissue (hematoma) that may be painful to the touch. It should usually decrease in size and tenderness within 1-2 weeks. ° °Follow these instructions at home: °· Take medicines only as directed by your health care provider. °· You may shower 24-48 hours after the procedure or as directed by your health care provider. Remove the bandage (dressing) and gently wash the site with plain soap and water. Pat the area dry with a clean towel. Do not rub the site, because this may cause bleeding. °· Do not take baths, swim, or use a hot tub until your health care provider approves. °· Check your insertion site every day for redness, swelling, or drainage. °· Do not apply powder or lotion to the site. °· Limit use of stairs to twice a day for the first 2-3 days or as directed by your health care provider. °· Do not squat for the first 2-3 days or as directed by your health care provider. °· Do not lift over 10 lb (4.5 kg) for 5 days after your procedure or as directed by your health care provider. °· Ask your health care provider when it is okay to: °? Return to work or school. °? Resume usual physical activities or sports. °? Resume sexual activity. °· Do not drive home if you are discharged the same day as the procedure. Have someone else drive you. °· You may drive 24 hours after the procedure unless otherwise instructed by  your health care provider. °· Do not operate machinery or power tools for 24 hours after the procedure or as directed by your health care provider. °· If your procedure was done as an outpatient procedure, which means that you went home the same day as your procedure, a responsible adult should be with you for the first 24 hours after you arrive home. °· Keep all follow-up visits as directed by your health care provider. This is important. °Contact a health care provider if: °· You have a fever. °· You have chills. °· You have increased bleeding from the site. Hold pressure on the site. °Get help right away if: °· You have unusual pain at the site. °· You have redness, warmth, or swelling at the site. °· You have drainage (other than a small amount of blood on the dressing) from the site. °· The site is bleeding, and the bleeding does not stop after 30 minutes of holding steady pressure on the site. °· Your leg or foot becomes pale, cool, tingly, or numb. °This information is not intended to replace advice given to you by your health care provider. Make sure you discuss any questions you have with your health care provider. °Document Released: 05/21/2014 Document Revised: 02/23/2016 Document Reviewed: 04/06/2014 °Elsevier Interactive Patient Education © 2018 [employer] ° °

## 2018-02-25 NOTE — Interval H&P Note (Signed)
History and Physical Interval Note:  02/25/2018 12:25 PM  Michael Blackwell  has presented today for surgery, with the diagnosis of resistant hypertension  The various methods of treatment have been discussed with the patient and family. After consideration of risks, benefits and other options for treatment, the patient has consented to  Procedure(s): RENAL ANGIOGRAPHY (N/A) as a surgical intervention .  The patient's history has been reviewed, patient examined, no change in status, stable for surgery.  I have reviewed the patient's chart and labs.  Questions were answered to the patient's satisfaction.     [place]

## 2018-02-25 NOTE — Interval H&P Note (Signed)
History and Physical Interval Note:  02/25/2018 12:09 PM  Michael Blackwell  has presented today for surgery, with the diagnosis of resistant hypertension  The various methods of treatment have been discussed with the patient and family. After consideration of risks, benefits and other options for treatment, the patient has consented to  Procedure(s): RENAL ANGIOGRAPHY (N/A) and possible angioplasty as a surgical intervention .  The patient's history has been reviewed, patient examined, no change in status, stable for surgery.  I have reviewed the patient's chart and labs.  Questions were answered to the patient's satisfaction.     Adrian Prows

## 2018-02-25 NOTE — Progress Notes (Signed)
Assumed care of pt from [facility], RN. Assessment documented. No change noted.

## 2018-02-25 NOTE — Progress Notes (Signed)
Site area: left groin fa sheath Site Prior to Removal:  Level 0 Pressure Applied For: 20 minutes Manual:   yes Patient Status During Pull:  stable Post Pull Site:  Level 0 Post Pull Instructions Given:  yes Post Pull Pulses Present: palpable left dp Dressing Applied:  Gauze and tegaderm Bedrest begins @ 1430 Comments:

## 2018-02-26 ENCOUNTER — Encounter (HOSPITAL_COMMUNITY): Payer: Self-pay | Admitting: Cardiology

## 2018-02-26 MED FILL — Heparin Sod (Porcine)-NaCl IV Soln 1000 Unit/500ML-0.9%: INTRAVENOUS | Qty: 1000 | Status: AC

## 2018-03-11 DIAGNOSIS — H9012 Conductive hearing loss, unilateral, left ear, with unrestricted hearing on the contralateral side: Secondary | ICD-10-CM | POA: Insufficient documentation

## 2018-11-05 ENCOUNTER — Other Ambulatory Visit: Payer: Self-pay | Admitting: Nephrology

## 2018-11-05 DIAGNOSIS — N185 Chronic kidney disease, stage 5: Secondary | ICD-10-CM

## 2018-11-19 ENCOUNTER — Other Ambulatory Visit: Payer: Medicare Other

## 2018-11-20 ENCOUNTER — Other Ambulatory Visit: Payer: Self-pay | Admitting: Cardiology

## 2018-11-20 DIAGNOSIS — R0989 Other specified symptoms and signs involving the circulatory and respiratory systems: Secondary | ICD-10-CM

## 2018-11-27 ENCOUNTER — Ambulatory Visit
Admission: RE | Admit: 2018-11-27 | Discharge: 2018-11-27 | Disposition: A | Discharge status: Home / Self Care | Payer: Medicare Other | Source: Ambulatory Visit | Attending: Nephrology | Admitting: Nephrology

## 2018-11-27 DIAGNOSIS — N185 Chronic kidney disease, stage 5: Secondary | ICD-10-CM

## 2018-11-30 ENCOUNTER — Other Ambulatory Visit: Payer: Self-pay | Admitting: Cardiology

## 2018-12-05 ENCOUNTER — Other Ambulatory Visit (HOSPITAL_COMMUNITY): Payer: Self-pay | Admitting: *Deleted

## 2018-12-08 ENCOUNTER — Inpatient Hospital Stay (HOSPITAL_COMMUNITY): Admission: RE | Admit: 2018-12-08 | Payer: Medicare Other | Source: Ambulatory Visit

## 2018-12-15 ENCOUNTER — Other Ambulatory Visit (HOSPITAL_COMMUNITY): Payer: Self-pay | Admitting: *Deleted

## 2018-12-16 ENCOUNTER — Ambulatory Visit (HOSPITAL_COMMUNITY)
Admission: RE | Admit: 2018-12-16 | Discharge: 2018-12-16 | Disposition: A | Discharge status: Home / Self Care | Payer: Medicare Other | Source: Ambulatory Visit | Attending: Internal Medicine | Admitting: Internal Medicine

## 2018-12-16 ENCOUNTER — Other Ambulatory Visit: Payer: Self-pay

## 2018-12-16 DIAGNOSIS — N184 Chronic kidney disease, stage 4 (severe): Secondary | ICD-10-CM | POA: Diagnosis not present

## 2018-12-16 DIAGNOSIS — D631 Anemia in chronic kidney disease: Secondary | ICD-10-CM | POA: Insufficient documentation

## 2018-12-16 MED ORDER — DARBEPOETIN ALFA 40 MCG/0.4ML IJ SOSY
PREFILLED_SYRINGE | INTRAMUSCULAR | Status: AC
  Filled 2018-12-16: qty 0.4

## 2018-12-16 MED ORDER — DARBEPOETIN ALFA 40 MCG/0.4ML IJ SOSY
40.0000 ug | PREFILLED_SYRINGE | Freq: Once | INTRAMUSCULAR | Status: AC
  Administered 2018-12-16: 40 ug via SUBCUTANEOUS

## 2018-12-16 NOTE — Discharge Instructions (Signed)
Epoetin Alfa injection °What is this medicine? °EPOETIN ALFA (e POE e tin AL fa) helps your body make more red blood cells. This medicine is used to treat anemia caused by chronic kidney disease, cancer chemotherapy, or HIV-therapy. It may also be used before surgery if you have anemia. °This medicine may be used for other purposes; ask your health care provider or pharmacist if you have questions. °COMMON BRAND NAME(S): Epogen, Procrit, Retacrit °What should I tell my health care provider before I take this medicine? °They need to know if you have any of these conditions: °-cancer °-heart disease °-high blood pressure °-history of blood clots °-history of stroke °-low levels of folate, iron, or vitamin B12 in the blood °-seizures °-an unusual or allergic reaction to erythropoietin, albumin, benzyl alcohol, hamster proteins, other medicines, foods, dyes, or preservatives °-pregnant or trying to get pregnant °-breast-feeding °How should I use this medicine? °This medicine is for injection into a vein or under the skin. It is usually given by a health care professional in a hospital or clinic setting. °If you get this medicine at home, you will be taught how to prepare and give this medicine. Use exactly as directed. Take your medicine at regular intervals. Do not take your medicine more often than directed. °It is important that you put your used needles and syringes in a special sharps container. Do not put them in a trash can. If you do not have a sharps container, call your pharmacist or healthcare provider to get one. °A special MedGuide will be given to you by the pharmacist with each prescription and refill. Be sure to read this information carefully each time. °Talk to your pediatrician regarding the use of this medicine in children. While this drug may be prescribed for selected conditions, precautions do apply. °Overdosage: If you think you have taken too much of this medicine contact a poison control center  or emergency room at once. °NOTE: This medicine is only for you. Do not share this medicine with others. °What if I miss a dose? °If you miss a dose, take it as soon as you can. If it is almost time for your next dose, take only that dose. Do not take double or extra doses. °What may interact with this medicine? °Interactions have not been studied. °This list may not describe all possible interactions. Give your health care provider a list of all the medicines, herbs, non-prescription drugs, or dietary supplements you use. Also tell them if you smoke, drink alcohol, or use illegal drugs. Some items may interact with your medicine. °What should I watch for while using this medicine? °Your condition will be monitored carefully while you are receiving this medicine. °You may need blood work done while you are taking this medicine. °This medicine may cause a decrease in vitamin B6. You should make sure that you get enough vitamin B6 while you are taking this medicine. Discuss the foods you eat and the vitamins you take with your health care professional. °What side effects may I notice from receiving this medicine? °Side effects that you should report to your doctor or health care professional as soon as possible: °-allergic reactions like skin rash, itching or hives, swelling of the face, lips, or tongue °-seizures °-signs and symptoms of a blood clot such as breathing problems; changes in vision; chest pain; severe, sudden headache; pain, swelling, warmth in the leg; trouble speaking; sudden numbness or weakness of the face, arm or leg °-signs and symptoms of a stroke   like changes in vision; confusion; trouble speaking or understanding; severe headaches; sudden numbness or weakness of the face, arm or leg; trouble walking; dizziness; loss of balance or coordination °Side effects that usually do not require medical attention (report to your doctor or health care professional if they continue or are  bothersome): °-chills °-cough °-dizziness °-fever °-headaches °-joint pain °-muscle cramps °-muscle pain °-nausea, vomiting °-pain, redness, or irritation at site where injected °This list may not describe all possible side effects. Call your doctor for medical advice about side effects. You may report side effects to FDA at 1-800-FDA-1088. °Where should I keep my medicine? °Keep out of the reach of children. °Store in a refrigerator between 2 and 8 degrees C (36 and 46 degrees F). Do not freeze or shake. Throw away any unused portion if using a single-dose vial. Multi-dose vials can be kept in the refrigerator for up to 21 days after the initial dose. Throw away unused medicine. °NOTE: This sheet is a summary. It may not cover all possible information. If you have questions about this medicine, talk to your doctor, pharmacist, or health care provider. °© 2019 Elsevier/Gold Standard (2017-04-26 08:35:19) ° °

## 2018-12-17 LAB — POCT HEMOGLOBIN-HEMACUE: Hemoglobin: 8.4 g/dL — ABNORMAL LOW (ref 13.0–17.0)

## 2018-12-22 ENCOUNTER — Encounter (HOSPITAL_COMMUNITY): Payer: Medicare Other

## 2018-12-25 ENCOUNTER — Other Ambulatory Visit: Payer: Self-pay

## 2018-12-25 DIAGNOSIS — N186 End stage renal disease: Secondary | ICD-10-CM

## 2018-12-26 ENCOUNTER — Encounter: Payer: Self-pay | Admitting: Vascular Surgery

## 2018-12-26 ENCOUNTER — Ambulatory Visit (HOSPITAL_COMMUNITY)
Admission: RE | Admit: 2018-12-26 | Discharge: 2018-12-26 | Disposition: A | Discharge status: Home / Self Care | Payer: Medicare Other | Source: Ambulatory Visit | Attending: Family | Admitting: Family

## 2018-12-26 ENCOUNTER — Other Ambulatory Visit: Payer: Self-pay

## 2018-12-26 ENCOUNTER — Ambulatory Visit (INDEPENDENT_AMBULATORY_CARE_PROVIDER_SITE_OTHER): Payer: Medicare Other | Admitting: Vascular Surgery

## 2018-12-26 ENCOUNTER — Ambulatory Visit (INDEPENDENT_AMBULATORY_CARE_PROVIDER_SITE_OTHER)
Admission: RE | Admit: 2018-12-26 | Discharge: 2018-12-26 | Disposition: A | Discharge status: Home / Self Care | Payer: Medicare Other | Source: Ambulatory Visit | Attending: Family | Admitting: Family

## 2018-12-26 VITALS — BP 147/57 | HR 60 | Temp 97.0°F | Resp 20 | Ht 69.0 in | Wt 205.0 lb

## 2018-12-26 DIAGNOSIS — N186 End stage renal disease: Secondary | ICD-10-CM

## 2018-12-26 DIAGNOSIS — N184 Chronic kidney disease, stage 4 (severe): Secondary | ICD-10-CM

## 2018-12-26 NOTE — Progress Notes (Signed)
Patient ID: Michael Blackwell, male   DOB: [birth year], 78 y.o.   MRN: [id number]  Reason for Consult: New Patient (Initial Visit)   Referred by Wallene Huh, MD  Subjective:     HPI:  Michael Blackwell is a 78 y.o. 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  Does have claudication bilateral lower extremities with walking approximately 100 yards does not have tissue loss or ulceration.  Has never had lower extremity intervention.  Has never had dialysis access.  Is never had chest breast or arm surgery.  Denies history of pacemaker or defibrillator.  Past Medical History:  Diagnosis Date  . Acute respiratory failure with hypoxia ([place]) 12/05/2016   [name] 12/05/2016  . Anemia   . Arthritis    "joints ache at night sometimes" (3/8/20180  . Chronic kidney disease (CKD), stage II (mild)    Acute on chronic kidney disease stage II-III/notes 12/06/2016  . History of radiation therapy 02/25/14- 04/23/14   prostate 7800 cGy 40 sessions, seminal vesicles 5600 cGy 40 sessions  . Hypercholesteremia   . Hypertension   . OSA on CPAP   . Pneumonia 12/06/2016  . Prostate cancer ([place]) 11/19/13   gleason 4+3=7, volume 34.74 cc  . Spermatocele    right  . Type II diabetes mellitus (HCC)    Family History  Problem Relation Age of Onset  . Cancer Brother        brain  . Cancer Mother        stomach  . Cancer Father        ? prostate   Past Surgical History:  Procedure Laterality Date  . LOWER EXTREMITY ANGIOGRAPHY Bilateral 02/25/2018   Procedure: Lower Extremity Angiography;  Surgeon: [name], MD;  Location: [facility];  Service: Cardiovascular;  Laterality: Bilateral;  limited  . PROSTATE BIOPSY  11/19/13   Gleason 4+3=7, vol 34.74 cc  . RENAL ANGIOGRAPHY N/A 02/25/2018   Procedure: RENAL ANGIOGRAPHY;  Surgeon: [name], MD;  Location: [place] [facility];  Service: Cardiovascular;   Laterality: N/A;  . TONSILLECTOMY     as child    Short Social History:  Social History   Tobacco Use  . Smoking status: Current Every Day Smoker    Packs/day: 0.33    Years: 48.00    Pack years: 15.84    Types: Cigarettes  . Smokeless tobacco: Never Used  Substance Use Topics  . Alcohol use: Yes    Comment: 12/06/2016 "I might drink 2 beer in a month"    No Known Allergies  Current Outpatient Medications  Medication Sig Dispense Refill  . amLODipine (NORVASC) 10 MG tablet Take 1 tablet (10 mg total) by mouth daily. 30 tablet 0  . aspirin 81 MG tablet Take 81 mg by mouth at bedtime.     . calcitRIOL (ROCALTROL) 0.25 MCG capsule Take 0.25 mcg by mouth daily.    . carvedilol (COREG) 6.25 MG tablet Take 1 tablet (6.25 mg total) by mouth 2 (two) times daily with a meal. 60 tablet 0  . cloNIDine (CATAPRES) 0.1 MG tablet Take 0.1 mg by mouth 2 (two) times daily.     . Ferrous Sulfate (IRON) 325 (65 Fe) MG TABS TAKE 1 TABLET BY MOUTH TWICE DAILY 180 each 0  . Fish Oil-Cholecalciferol (OMEGA-3 FISH OIL/VITAMIN D3 PO) Take 1 capsule by mouth daily.    . furosemide (LASIX) 40 MG tablet Take 1.5 tablets (  60 mg total) by mouth 2 (two) times daily. 120 tablet 0  . hydrALAZINE (APRESOLINE) 100 MG tablet Take 1 tablet (100 mg total) by mouth every 8 (eight) hours. 90 tablet 0  . isosorbide mononitrate (IMDUR) 30 MG 24 hr tablet Take 1 tablet (30 mg total) by mouth daily. 30 tablet 0  . isosorbide-hydrALAZINE (BIDIL) 20-37.5 MG tablet Take 2 tablets by mouth 3 (three) times daily.    . Multiple Vitamins-Minerals (CENTRUM SILVER 50+MEN) TABS Take 1 tablet by mouth daily.    . potassium chloride SA (K-DUR,KLOR-CON) 20 MEQ tablet Take 1 tablet (20 mEq total) by mouth daily. 30 tablet 0  . rosuvastatin (CRESTOR) 10 MG tablet Take 1 tablet (10 mg total) by mouth daily. 30 tablet 0  . tamsulosin (FLOMAX) 0.4 MG CAPS capsule Take 1 capsule (0.4 mg total) by mouth daily. 30 capsule 3   No current  facility-administered medications for this visit.     Review of Systems  Constitutional:  Constitutional negative. HENT: HENT negative.  Eyes: Eyes negative.  Respiratory: Positive for shortness of breath.  Cardiovascular: Positive for claudication and leg swelling.  Musculoskeletal: Musculoskeletal negative.  Skin: Skin negative.  Neurological: Neurological negative. Hematologic: Hematologic/lymphatic negative.  Psychiatric: Psychiatric negative.        Objective:  Objective   Vitals:   12/26/18 1047  BP: (!) 147/57  Pulse: 60  Resp: 20  Temp: (!) 97 F (36.1 C)  SpO2: 99%  Weight: 205 lb (93 kg)  Height: 5\' 9"  (1.753 m)   Body mass index is 30.27 kg/m.  Physical Exam HENT:     Head: Normocephalic.     Nose: Nose normal.     Mouth/Throat:     Mouth: Mucous membranes are moist.  Eyes:     Pupils: Pupils are equal, round, and reactive to light.  Cardiovascular:     Pulses:          Radial pulses are 2+ on the right side and 2+ on the left side.       Popliteal pulses are 0 on the right side and 0 on the left side.  Pulmonary:     Effort: Pulmonary effort is normal.     Breath sounds: Normal breath sounds.  Abdominal:     General: Abdomen is flat.     Palpations: Abdomen is soft. There is no mass.  Musculoskeletal: Normal range of motion.        General: No swelling.  Skin:    General: Skin is warm and dry.     Capillary Refill: Capillary refill takes less than 2 seconds.  Neurological:     Mental Status: He is alert.  Psychiatric:        Mood and Affect: Mood normal.        Behavior: Behavior normal.        Thought Content: Thought content normal.        Judgment: Judgment normal.     Data: I have independently interpreted his upper extremity vein mapping which demonstrates suitable right basilic vein and left basilic vein for dialysis access.  Diameters listed under results.  I have independently interpreted his upper extremity arterial duplex  which demonstrates biphasic waveforms in his bilateral brachial arteries 0.57 cm bilaterally.     Assessment/Plan:     78 year old left-hand-dominant male.  Appears to have suitable right basilic vein for dialysis access I do not identify any better vein by physical exam.  We will plan for right arm  fistula creation in the near future.  Unfortunately given novel coronavirus at this time all elective surgeries are being postponed I have discussed this with him he demonstrates good understanding and understands that if he needs dialysis in the interim he would need a tunneled catheter.     [name] MD [facility]

## 2018-12-29 ENCOUNTER — Other Ambulatory Visit: Payer: Self-pay

## 2018-12-30 ENCOUNTER — Encounter (HOSPITAL_COMMUNITY)
Admission: RE | Admit: 2018-12-30 | Discharge: 2018-12-30 | Disposition: A | Discharge status: Home / Self Care | Payer: Medicare Other | Source: Ambulatory Visit | Attending: Nephrology | Admitting: Nephrology

## 2018-12-31 ENCOUNTER — Other Ambulatory Visit: Payer: Self-pay

## 2019-01-09 ENCOUNTER — Other Ambulatory Visit: Payer: Self-pay

## 2019-01-12 ENCOUNTER — Other Ambulatory Visit: Payer: Self-pay

## 2019-01-12 ENCOUNTER — Encounter (HOSPITAL_COMMUNITY)
Admission: RE | Admit: 2019-01-12 | Discharge: 2019-01-12 | Disposition: A | Discharge status: Home / Self Care | Payer: Medicare Other | Source: Ambulatory Visit | Attending: Nephrology | Admitting: Nephrology

## 2019-01-12 VITALS — BP 133/95 | HR 57 | Temp 97.6°F | Resp 20

## 2019-01-12 DIAGNOSIS — N184 Chronic kidney disease, stage 4 (severe): Secondary | ICD-10-CM | POA: Diagnosis present

## 2019-01-12 DIAGNOSIS — D631 Anemia in chronic kidney disease: Secondary | ICD-10-CM | POA: Insufficient documentation

## 2019-01-12 DIAGNOSIS — D508 Other iron deficiency anemias: Secondary | ICD-10-CM

## 2019-01-12 DIAGNOSIS — I701 Atherosclerosis of renal artery: Secondary | ICD-10-CM | POA: Diagnosis not present

## 2019-01-12 LAB — CBC WITH DIFFERENTIAL/PLATELET
Abs Immature Granulocytes: 0.01 10*3/uL (ref 0.00–0.07)
Basophils Absolute: 0.1 10*3/uL (ref 0.0–0.1)
Basophils Relative: 1 %
Eosinophils Absolute: 0.1 10*3/uL (ref 0.0–0.5)
Eosinophils Relative: 1 %
HCT: 26.3 % — ABNORMAL LOW (ref 39.0–52.0)
Hemoglobin: 8.1 g/dL — ABNORMAL LOW (ref 13.0–17.0)
Immature Granulocytes: 0 %
Lymphocytes Relative: 31 %
Lymphs Abs: 1.5 10*3/uL (ref 0.7–4.0)
MCH: 26.8 pg (ref 26.0–34.0)
MCHC: 30.8 g/dL (ref 30.0–36.0)
MCV: 87.1 fL (ref 80.0–100.0)
Monocytes Absolute: 0.5 10*3/uL (ref 0.1–1.0)
Monocytes Relative: 10 %
Neutro Abs: 2.8 10*3/uL (ref 1.7–7.7)
Neutrophils Relative %: 57 %
Platelets: 182 10*3/uL (ref 150–400)
RBC: 3.02 MIL/uL — ABNORMAL LOW (ref 4.22–5.81)
RDW: 15.7 % — ABNORMAL HIGH (ref 11.5–15.5)
WBC: 5 10*3/uL (ref 4.0–10.5)
nRBC: 0 % (ref 0.0–0.2)

## 2019-01-12 LAB — POCT HEMOGLOBIN-HEMACUE: Hemoglobin: 8.2 g/dL — ABNORMAL LOW (ref 13.0–17.0)

## 2019-01-12 MED ORDER — DARBEPOETIN ALFA 40 MCG/0.4ML IJ SOSY
PREFILLED_SYRINGE | INTRAMUSCULAR | Status: AC
  Filled 2019-01-12: qty 0.4

## 2019-01-12 MED ORDER — DARBEPOETIN ALFA 40 MCG/0.4ML IJ SOSY
40.0000 ug | PREFILLED_SYRINGE | INTRAMUSCULAR | Status: DC
  Administered 2019-01-12: 40 ug via SUBCUTANEOUS

## 2019-01-26 ENCOUNTER — Other Ambulatory Visit: Payer: Self-pay | Admitting: Cardiology

## 2019-01-26 NOTE — Telephone Encounter (Signed)
Please fill

## 2019-01-27 ENCOUNTER — Encounter (HOSPITAL_COMMUNITY): Payer: Medicare Other

## 2019-02-06 ENCOUNTER — Other Ambulatory Visit: Payer: Self-pay

## 2019-02-09 ENCOUNTER — Other Ambulatory Visit: Payer: Self-pay

## 2019-02-09 ENCOUNTER — Encounter (HOSPITAL_COMMUNITY)
Admission: RE | Admit: 2019-02-09 | Discharge: 2019-02-09 | Disposition: A | Discharge status: Home / Self Care | Payer: Medicare Other | Source: Ambulatory Visit | Attending: Nephrology | Admitting: Nephrology

## 2019-02-09 VITALS — BP 148/62 | HR 62 | Temp 98.7°F

## 2019-02-09 DIAGNOSIS — D508 Other iron deficiency anemias: Secondary | ICD-10-CM

## 2019-02-09 DIAGNOSIS — N184 Chronic kidney disease, stage 4 (severe): Secondary | ICD-10-CM | POA: Diagnosis present

## 2019-02-09 DIAGNOSIS — D631 Anemia in chronic kidney disease: Secondary | ICD-10-CM | POA: Diagnosis not present

## 2019-02-09 DIAGNOSIS — I701 Atherosclerosis of renal artery: Secondary | ICD-10-CM

## 2019-02-09 LAB — CBC WITH DIFFERENTIAL/PLATELET
Abs Immature Granulocytes: 0.01 10*3/uL (ref 0.00–0.07)
Basophils Absolute: 0.1 10*3/uL (ref 0.0–0.1)
Basophils Relative: 1 %
Eosinophils Absolute: 0.1 10*3/uL (ref 0.0–0.5)
Eosinophils Relative: 1 %
HCT: 25.9 % — ABNORMAL LOW (ref 39.0–52.0)
Hemoglobin: 8.4 g/dL — ABNORMAL LOW (ref 13.0–17.0)
Immature Granulocytes: 0 %
Lymphocytes Relative: 31 %
Lymphs Abs: 1.7 10*3/uL (ref 0.7–4.0)
MCH: 27.9 pg (ref 26.0–34.0)
MCHC: 32.4 g/dL (ref 30.0–36.0)
MCV: 86 fL (ref 80.0–100.0)
Monocytes Absolute: 0.4 10*3/uL (ref 0.1–1.0)
Monocytes Relative: 8 %
Neutro Abs: 3.2 10*3/uL (ref 1.7–7.7)
Neutrophils Relative %: 59 %
Platelets: 209 10*3/uL (ref 150–400)
RBC: 3.01 MIL/uL — ABNORMAL LOW (ref 4.22–5.81)
RDW: 15.4 % (ref 11.5–15.5)
WBC: 5.4 10*3/uL (ref 4.0–10.5)
nRBC: 0 % (ref 0.0–0.2)

## 2019-02-09 LAB — IRON AND TIBC
Iron: 57 ug/dL (ref 45–182)
Saturation Ratios: 24 % (ref 17.9–39.5)
TIBC: 235 ug/dL — ABNORMAL LOW (ref 250–450)
UIBC: 178 ug/dL

## 2019-02-09 LAB — RENAL FUNCTION PANEL
Albumin: 3.1 g/dL — ABNORMAL LOW (ref 3.5–5.0)
Anion gap: 9 (ref 5–15)
BUN: 59 mg/dL — ABNORMAL HIGH (ref 8–23)
CO2: 22 mmol/L (ref 22–32)
Calcium: 8.5 mg/dL — ABNORMAL LOW (ref 8.9–10.3)
Chloride: 111 mmol/L (ref 98–111)
Creatinine, Ser: 5.56 mg/dL — ABNORMAL HIGH (ref 0.61–1.24)
GFR calc Af Amer: 11 mL/min — ABNORMAL LOW (ref >60)
GFR calc non Af Amer: 9 mL/min — ABNORMAL LOW (ref >60)
Glucose, Bld: 134 mg/dL — ABNORMAL HIGH (ref 70–99)
Phosphorus: 5.5 mg/dL — ABNORMAL HIGH (ref 2.5–4.6)
Potassium: 4.1 mmol/L (ref 3.5–5.1)
Sodium: 142 mmol/L (ref 135–145)

## 2019-02-09 LAB — POCT HEMOGLOBIN-HEMACUE: Hemoglobin: 8.5 g/dL — ABNORMAL LOW (ref 13.0–17.0)

## 2019-02-09 LAB — FERRITIN: Ferritin: 139 ng/mL (ref 24–336)

## 2019-02-09 MED ORDER — DARBEPOETIN ALFA 60 MCG/0.3ML IJ SOSY
PREFILLED_SYRINGE | INTRAMUSCULAR | Status: AC
  Filled 2019-02-09: qty 0.3

## 2019-02-09 MED ORDER — DARBEPOETIN ALFA 60 MCG/0.3ML IJ SOSY
60.0000 ug | PREFILLED_SYRINGE | INTRAMUSCULAR | Status: DC
  Administered 2019-02-09: 60 ug via SUBCUTANEOUS

## 2019-02-10 LAB — HEPATITIS B SURFACE ANTIGEN: Hepatitis B Surface Ag: NEGATIVE

## 2019-02-16 ENCOUNTER — Encounter (HOSPITAL_COMMUNITY): Payer: Medicare Other

## 2019-02-19 ENCOUNTER — Other Ambulatory Visit: Payer: Self-pay

## 2019-03-02 ENCOUNTER — Telehealth: Payer: Self-pay

## 2019-03-02 NOTE — Telephone Encounter (Signed)
Stop isosorbide mononitrate. Was Rx by hospitalist

## 2019-03-02 NOTE — Telephone Encounter (Signed)
Daughter-Katie called stating that pt is about to have surgery and it was questioned why pt is taking Bidil and Isosorbide stating that they are the same. Joellen Jersey thinks that it is Dr. Verneda Skill but is not sure if he is the one questioning. Please advise.//ah

## 2019-03-03 ENCOUNTER — Encounter (HOSPITAL_COMMUNITY): Payer: Self-pay | Admitting: *Deleted

## 2019-03-03 ENCOUNTER — Other Ambulatory Visit: Payer: Self-pay

## 2019-03-03 ENCOUNTER — Other Ambulatory Visit (HOSPITAL_COMMUNITY)
Admission: RE | Admit: 2019-03-03 | Discharge: 2019-03-03 | Disposition: A | Discharge status: Home / Self Care | Payer: Medicare Other | Source: Ambulatory Visit | Attending: Vascular Surgery | Admitting: Vascular Surgery

## 2019-03-03 DIAGNOSIS — Z1159 Encounter for screening for other viral diseases: Secondary | ICD-10-CM | POA: Insufficient documentation

## 2019-03-03 LAB — SARS CORONAVIRUS 2 BY RT PCR (HOSPITAL ORDER, PERFORMED IN ~~LOC~~ HOSPITAL LAB): SARS Coronavirus 2: NEGATIVE

## 2019-03-03 NOTE — Progress Notes (Addendum)
Michael Blackwell chest pain or shortness of breath. Patient denies that he nor his family has experienced any of the following: Cough Fever >100.4 Runny Nose Sore Throat Difficulty breathing/ shortness of breath Travel in past 14 days- no.  Patient said he can go to [facility] for COVCD Test today at 10:45; he knows that after test he needs to go home and not allow people who do not live in home come in home.  PCP is Dr Marlou Sa, cardiologist is Dr Einar Gip, I requested records for Dr Irven Shelling office. I read note from Dr Einar Gip, that said Michael Blackwell should stop Isosorbide and just take Bidil as prescribed by Dr Einar Gip. Michael Blackwell voiced understanding. Michael Blackwell has type II diabetes, he does not check CBG,  Does not have a working meter.

## 2019-03-03 NOTE — Telephone Encounter (Signed)
Pt aware. States that she contacted pcp also and was told to finish what he had of the isosorbide and then stop.//ah

## 2019-03-04 ENCOUNTER — Ambulatory Visit (HOSPITAL_COMMUNITY): Payer: Medicare Other | Admitting: Anesthesiology

## 2019-03-04 ENCOUNTER — Ambulatory Visit (HOSPITAL_COMMUNITY)
Admission: RE | Admit: 2019-03-04 | Discharge: 2019-03-04 | Disposition: A | Discharge status: Home / Self Care | Payer: Medicare Other | Attending: Vascular Surgery | Admitting: Vascular Surgery

## 2019-03-04 ENCOUNTER — Other Ambulatory Visit: Payer: Self-pay

## 2019-03-04 ENCOUNTER — Encounter (HOSPITAL_COMMUNITY)
Admission: RE | Disposition: A | Discharge status: Home / Self Care | Payer: Self-pay | Source: Home / Self Care | Attending: Vascular Surgery

## 2019-03-04 DIAGNOSIS — I129 Hypertensive chronic kidney disease with stage 1 through stage 4 chronic kidney disease, or unspecified chronic kidney disease: Secondary | ICD-10-CM | POA: Diagnosis not present

## 2019-03-04 DIAGNOSIS — Z800 Family history of malignant neoplasm of digestive organs: Secondary | ICD-10-CM | POA: Insufficient documentation

## 2019-03-04 DIAGNOSIS — Z7982 Long term (current) use of aspirin: Secondary | ICD-10-CM | POA: Diagnosis not present

## 2019-03-04 DIAGNOSIS — F419 Anxiety disorder, unspecified: Secondary | ICD-10-CM | POA: Insufficient documentation

## 2019-03-04 DIAGNOSIS — M1990 Unspecified osteoarthritis, unspecified site: Secondary | ICD-10-CM | POA: Diagnosis not present

## 2019-03-04 DIAGNOSIS — Z923 Personal history of irradiation: Secondary | ICD-10-CM | POA: Insufficient documentation

## 2019-03-04 DIAGNOSIS — Z79899 Other long term (current) drug therapy: Secondary | ICD-10-CM | POA: Insufficient documentation

## 2019-03-04 DIAGNOSIS — E785 Hyperlipidemia, unspecified: Secondary | ICD-10-CM | POA: Insufficient documentation

## 2019-03-04 DIAGNOSIS — G4733 Obstructive sleep apnea (adult) (pediatric): Secondary | ICD-10-CM | POA: Diagnosis not present

## 2019-03-04 DIAGNOSIS — D631 Anemia in chronic kidney disease: Secondary | ICD-10-CM | POA: Insufficient documentation

## 2019-03-04 DIAGNOSIS — E1122 Type 2 diabetes mellitus with diabetic chronic kidney disease: Secondary | ICD-10-CM | POA: Diagnosis not present

## 2019-03-04 DIAGNOSIS — N185 Chronic kidney disease, stage 5: Secondary | ICD-10-CM

## 2019-03-04 DIAGNOSIS — E7800 Pure hypercholesterolemia, unspecified: Secondary | ICD-10-CM | POA: Diagnosis not present

## 2019-03-04 DIAGNOSIS — E1151 Type 2 diabetes mellitus with diabetic peripheral angiopathy without gangrene: Secondary | ICD-10-CM | POA: Diagnosis not present

## 2019-03-04 DIAGNOSIS — F17210 Nicotine dependence, cigarettes, uncomplicated: Secondary | ICD-10-CM | POA: Diagnosis not present

## 2019-03-04 DIAGNOSIS — Z8546 Personal history of malignant neoplasm of prostate: Secondary | ICD-10-CM | POA: Insufficient documentation

## 2019-03-04 DIAGNOSIS — Z808 Family history of malignant neoplasm of other organs or systems: Secondary | ICD-10-CM | POA: Diagnosis not present

## 2019-03-04 DIAGNOSIS — N183 Chronic kidney disease, stage 3 (moderate): Secondary | ICD-10-CM | POA: Diagnosis present

## 2019-03-04 HISTORY — DX: Dyspnea, unspecified: R06.00

## 2019-03-04 HISTORY — DX: Anxiety disorder, unspecified: F41.9

## 2019-03-04 HISTORY — DX: Cardiac murmur, unspecified: R01.1

## 2019-03-04 HISTORY — PX: AV FISTULA PLACEMENT: SHX1204

## 2019-03-04 LAB — POCT I-STAT 4, (NA,K, GLUC, HGB,HCT)
Glucose, Bld: 96 mg/dL (ref 70–99)
HCT: 27 % — ABNORMAL LOW (ref 39.0–52.0)
Hemoglobin: 9.2 g/dL — ABNORMAL LOW (ref 13.0–17.0)
Potassium: 3.9 mmol/L (ref 3.5–5.1)
Sodium: 138 mmol/L (ref 135–145)

## 2019-03-04 LAB — GLUCOSE, CAPILLARY
Glucose-Capillary: 95 mg/dL (ref 70–99)
Glucose-Capillary: 115 mg/dL — ABNORMAL HIGH (ref 70–99)

## 2019-03-04 SURGERY — ARTERIOVENOUS (AV) FISTULA CREATION
Anesthesia: Monitor Anesthesia Care | Site: Arm Lower | Laterality: Right

## 2019-03-04 MED ORDER — ONDANSETRON HCL 4 MG/2ML IJ SOLN
INTRAMUSCULAR | Status: DC | PRN
  Administered 2019-03-04: 4 mg via INTRAVENOUS

## 2019-03-04 MED ORDER — SODIUM CHLORIDE 0.9 % IV SOLN
INTRAVENOUS | Status: AC
  Filled 2019-03-04: qty 1.2

## 2019-03-04 MED ORDER — OXYCODONE-ACETAMINOPHEN 5-325 MG PO TABS: 1.0000 | ORAL_TABLET | Freq: Four times a day (QID) | ORAL | 0 refills | Status: DC | PRN

## 2019-03-04 MED ORDER — PROPOFOL 500 MG/50ML IV EMUL
INTRAVENOUS | Status: DC | PRN
  Administered 2019-03-04: 100 ug/kg/min via INTRAVENOUS

## 2019-03-04 MED ORDER — CEFAZOLIN SODIUM-DEXTROSE 2-4 GM/100ML-% IV SOLN
INTRAVENOUS | Status: AC
  Filled 2019-03-04: qty 100

## 2019-03-04 MED ORDER — CHLORHEXIDINE GLUCONATE 4 % EX LIQD: 60.0000 mL | Freq: Once | CUTANEOUS | Status: DC

## 2019-03-04 MED ORDER — 0.9 % SODIUM CHLORIDE (POUR BTL) OPTIME
TOPICAL | Status: DC | PRN
  Administered 2019-03-04: 1000 mL

## 2019-03-04 MED ORDER — PROPOFOL 10 MG/ML IV BOLUS
INTRAVENOUS | Status: DC | PRN
  Administered 2019-03-04 (×2): 20 mg via INTRAVENOUS
  Administered 2019-03-04: 10 mg via INTRAVENOUS

## 2019-03-04 MED ORDER — FENTANYL CITRATE (PF) 100 MCG/2ML IJ SOLN: 25.0000 ug | INTRAMUSCULAR | Status: DC | PRN

## 2019-03-04 MED ORDER — LIDOCAINE HCL 1 % IJ SOLN
INTRAMUSCULAR | Status: DC | PRN
  Administered 2019-03-04: 10 mL

## 2019-03-04 MED ORDER — DEXAMETHASONE SODIUM PHOSPHATE 10 MG/ML IJ SOLN
INTRAMUSCULAR | Status: DC | PRN
  Administered 2019-03-04: 4 mg via INTRAVENOUS

## 2019-03-04 MED ORDER — PROPOFOL 10 MG/ML IV BOLUS
INTRAVENOUS | Status: AC
  Filled 2019-03-04: qty 20

## 2019-03-04 MED ORDER — FENTANYL CITRATE (PF) 250 MCG/5ML IJ SOLN
INTRAMUSCULAR | Status: DC | PRN
  Administered 2019-03-04: 50 ug via INTRAVENOUS

## 2019-03-04 MED ORDER — LIDOCAINE-EPINEPHRINE (PF) 1 %-1:200000 IJ SOLN
INTRAMUSCULAR | Status: AC
  Filled 2019-03-04: qty 30

## 2019-03-04 MED ORDER — CEFAZOLIN SODIUM-DEXTROSE 2-4 GM/100ML-% IV SOLN
2.0000 g | INTRAVENOUS | Status: AC
  Administered 2019-03-04: 2 g via INTRAVENOUS

## 2019-03-04 MED ORDER — FENTANYL CITRATE (PF) 250 MCG/5ML IJ SOLN
INTRAMUSCULAR | Status: AC
  Filled 2019-03-04: qty 5

## 2019-03-04 MED ORDER — SODIUM CHLORIDE 0.9 % IV SOLN
INTRAVENOUS | Status: DC | PRN
  Administered 2019-03-04: 500 mL

## 2019-03-04 MED ORDER — SODIUM CHLORIDE 0.9 % IV SOLN
INTRAVENOUS | Status: DC
  Administered 2019-03-04: 08:00:00 via INTRAVENOUS

## 2019-03-04 MED ORDER — ONDANSETRON HCL 4 MG/2ML IJ SOLN: 4.0000 mg | Freq: Once | INTRAMUSCULAR | Status: DC | PRN

## 2019-03-04 MED ORDER — ACETAMINOPHEN 500 MG PO TABS
1000.0000 mg | ORAL_TABLET | Freq: Once | ORAL | Status: AC
  Administered 2019-03-04: 1000 mg via ORAL
  Filled 2019-03-04: qty 2

## 2019-03-04 SURGICAL SUPPLY — 28 items
ARMBAND PINK RESTRICT EXTREMIT (MISCELLANEOUS) ×3 IMPLANT
CANISTER SUCT 3000ML PPV (MISCELLANEOUS) ×3 IMPLANT
CLIP VESOCCLUDE MED 6/CT (CLIP) ×3 IMPLANT
CLIP VESOCCLUDE SM WIDE 6/CT (CLIP) ×3 IMPLANT
COVER PROBE W GEL 5X96 (DRAPES) IMPLANT
COVER WAND RF STERILE (DRAPES) ×3 IMPLANT
DERMABOND ADVANCED (GAUZE/BANDAGES/DRESSINGS) ×2
DERMABOND ADVANCED .7 DNX12 (GAUZE/BANDAGES/DRESSINGS) ×1 IMPLANT
ELECT REM PT RETURN 9FT ADLT (ELECTROSURGICAL) ×3
ELECTRODE REM PT RTRN 9FT ADLT (ELECTROSURGICAL) ×1 IMPLANT
GLOVE BIO SURGEON STRL SZ7.5 (GLOVE) ×3 IMPLANT
GOWN STRL REUS W/ TWL LRG LVL3 (GOWN DISPOSABLE) ×2 IMPLANT
GOWN STRL REUS W/ TWL XL LVL3 (GOWN DISPOSABLE) ×1 IMPLANT
GOWN STRL REUS W/TWL LRG LVL3 (GOWN DISPOSABLE) ×4
GOWN STRL REUS W/TWL XL LVL3 (GOWN DISPOSABLE) ×2
INSERT FOGARTY SM (MISCELLANEOUS) IMPLANT
KIT BASIN OR (CUSTOM PROCEDURE TRAY) ×3 IMPLANT
KIT TURNOVER KIT B (KITS) ×3 IMPLANT
NS IRRIG 1000ML POUR BTL (IV SOLUTION) ×3 IMPLANT
PACK CV ACCESS (CUSTOM PROCEDURE TRAY) ×3 IMPLANT
PAD ARMBOARD 7.5X6 YLW CONV (MISCELLANEOUS) ×6 IMPLANT
SUT MNCRL AB 4-0 PS2 18 (SUTURE) ×3 IMPLANT
SUT PROLENE 6 0 BV (SUTURE) ×6 IMPLANT
SUT VIC AB 3-0 SH 27 (SUTURE) ×2
SUT VIC AB 3-0 SH 27X BRD (SUTURE) ×1 IMPLANT
TOWEL GREEN STERILE (TOWEL DISPOSABLE) ×3 IMPLANT
UNDERPAD 30X30 (UNDERPADS AND DIAPERS) ×3 IMPLANT
WATER STERILE IRR 1000ML POUR (IV SOLUTION) ×3 IMPLANT

## 2019-03-04 NOTE — H&P (Signed)
HPI:  Michael Blackwell is a 78 y.o. 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  Does have claudication bilateral lower extremities with walking approximately 100 yards does not have tissue loss or ulceration.  Has never had lower extremity intervention.  Has never had dialysis access.  Is never had chest breast or arm surgery.  Denies history of pacemaker or defibrillator.      Past Medical History:  Diagnosis Date  . Acute respiratory failure with hypoxia ([place]) 12/05/2016   [name] 12/05/2016  . Anemia   . Arthritis    "joints ache at night sometimes" (3/8/20180  . Chronic kidney disease (CKD), stage II (mild)    Acute on chronic kidney disease stage II-III/notes 12/06/2016  . History of radiation therapy 02/25/14- 04/23/14   prostate 7800 cGy 40 sessions, seminal vesicles 5600 cGy 40 sessions  . Hypercholesteremia   . Hypertension   . OSA on CPAP   . Pneumonia 12/06/2016  . Prostate cancer ([place]) 11/19/13   gleason 4+3=7, volume 34.74 cc  . Spermatocele    right  . Type II diabetes mellitus (HCC)    Family History  Problem Relation Age of Onset  . Cancer Brother        brain  . Cancer Mother        stomach  . Cancer Father        ? prostate        Past Surgical History:  Procedure Laterality Date  . LOWER EXTREMITY ANGIOGRAPHY Bilateral 02/25/2018   Procedure: Lower Extremity Angiography;  Surgeon: [name], MD;  Location: [place] [facility];  Service: Cardiovascular;  Laterality: Bilateral;  limited  . PROSTATE BIOPSY  11/19/13   Gleason 4+3=7, vol 34.74 cc  . RENAL ANGIOGRAPHY N/A 02/25/2018   Procedure: RENAL ANGIOGRAPHY;  Surgeon: [name], MD;  Location: [facility] [facility];  Service: Cardiovascular;  Laterality: N/A;  . TONSILLECTOMY     as child    Short Social History:  Social History        Tobacco Use  . Smoking status:  Current Every Day Smoker    Packs/day: 0.33    Years: 48.00    Pack years: 15.84    Types: Cigarettes  . Smokeless tobacco: Never Used  Substance Use Topics  . Alcohol use: Yes    Comment: 12/06/2016 "I might drink 2 beer in a month"    No Known Allergies        Current Outpatient Medications  Medication Sig Dispense Refill  . amLODipine (NORVASC) 10 MG tablet Take 1 tablet (10 mg total) by mouth daily. 30 tablet 0  . aspirin 81 MG tablet Take 81 mg by mouth at bedtime.     . calcitRIOL (ROCALTROL) 0.25 MCG capsule Take 0.25 mcg by mouth daily.    . carvedilol (COREG) 6.25 MG tablet Take 1 tablet (6.25 mg total) by mouth 2 (two) times daily with a meal. 60 tablet 0  . cloNIDine (CATAPRES) 0.1 MG tablet Take 0.1 mg by mouth 2 (two) times daily.     . Ferrous Sulfate (IRON) 325 (65 Fe) MG TABS TAKE 1 TABLET BY MOUTH TWICE DAILY 180 each 0  . Fish Oil-Cholecalciferol (OMEGA-3 FISH OIL/VITAMIN D3 PO) Take 1 capsule by mouth daily.    . furosemide (LASIX) 40 MG tablet Take 1.5 tablets (60 mg total) by mouth 2 (two) times daily. 120 tablet 0  . hydrALAZINE (APRESOLINE)  100 MG tablet Take 1 tablet (100 mg total) by mouth every 8 (eight) hours. 90 tablet 0  . isosorbide mononitrate (IMDUR) 30 MG 24 hr tablet Take 1 tablet (30 mg total) by mouth daily. 30 tablet 0  . isosorbide-hydrALAZINE (BIDIL) 20-37.5 MG tablet Take 2 tablets by mouth 3 (three) times daily.    . Multiple Vitamins-Minerals (CENTRUM SILVER 50+MEN) TABS Take 1 tablet by mouth daily.    . potassium chloride SA (K-DUR,KLOR-CON) 20 MEQ tablet Take 1 tablet (20 mEq total) by mouth daily. 30 tablet 0  . rosuvastatin (CRESTOR) 10 MG tablet Take 1 tablet (10 mg total) by mouth daily. 30 tablet 0  . tamsulosin (FLOMAX) 0.4 MG CAPS capsule Take 1 capsule (0.4 mg total) by mouth daily. 30 capsule 3   No current facility-administered medications for this visit.     Review of Systems  Constitutional:   Constitutional negative. HENT: HENT negative.  Eyes: Eyes negative.  Respiratory: Positive for shortness of breath.  Cardiovascular: Positive for claudication and leg swelling.  Musculoskeletal: Musculoskeletal negative.  Skin: Skin negative.  Neurological: Neurological negative. Hematologic: Hematologic/lymphatic negative.  Psychiatric: Psychiatric negative.        Objective:   Vitals:   03/04/19 0659  BP: (!) 155/62  Temp: 98.5 F (36.9 C)  SpO2: 98%     Physical Exam HENT:     Head: Normocephalic.     Nose: Nose normal.     Mouth/Throat:     Mouth: Mucous membranes are moist.  Eyes:     Pupils: Pupils are equal, round, and reactive to light.  Cardiovascular:     Pulses:          Radial pulses are 2+ on the right side and 2+ on the left side.       Popliteal pulses are 0 on the right side and 0 on the left side.  Pulmonary:     Effort: Pulmonary effort is normal.     Breath sounds: Normal breath sounds.  Abdominal:     General: Abdomen is flat.     Palpations: Abdomen is soft. There is no mass.  Musculoskeletal: Normal range of motion.        General: No swelling.  Skin:    General: Skin is warm and dry.     Capillary Refill: Capillary refill takes less than 2 seconds.  Neurological:     Mental Status: He is alert.  Psychiatric:        Mood and Affect: Mood normal.        Behavior: Behavior normal.        Thought Content: Thought content normal.        Judgment: Judgment normal.     Data: I have independently interpreted his upper extremity vein mapping which demonstrates suitable right basilic vein and left basilic vein for dialysis access.  Diameters listed under results.  I have independently interpreted his upper extremity arterial duplex which demonstrates biphasic waveforms in his bilateral brachial arteries 0.57 cm bilaterally.     Assessment/Plan:      78 year old left-hand-dominant male.  Appears to have suitable right basilic  vein for dialysis access I do not identify any better vein by physical exam.  We will plan for right arm fistula creation. Again discussed risks benefits and alternatives.    [name] MD [facility]

## 2019-03-04 NOTE — Anesthesia Postprocedure Evaluation (Signed)
Anesthesia Post Note  Patient: Michael Blackwell  Procedure(s) Performed: RIGHT ARM ARTERIOVENOUS  FISTULA CREATION (Right Arm Lower)     Patient location during evaluation: PACU Anesthesia Type: MAC Level of consciousness: awake and alert Pain management: pain level controlled Vital Signs Assessment: post-procedure vital signs reviewed and stable Respiratory status: spontaneous breathing, nonlabored ventilation, respiratory function stable and patient connected to nasal cannula oxygen Cardiovascular status: stable and blood pressure returned to baseline Postop Assessment: no apparent nausea or vomiting Anesthetic complications: no    Last Vitals:  Vitals:   03/04/19 1005 03/04/19 1026  BP: (!) 158/66 (!) 141/73  Pulse: 64   Resp: 14   Temp: (!) 36.1 C   SpO2: 93% 100%    Last Pain:  Vitals:   03/04/19 1026  TempSrc:   PainSc: 0-No pain                 Ryan P Ellender

## 2019-03-04 NOTE — Anesthesia Preprocedure Evaluation (Addendum)
Anesthesia Evaluation  Patient identified by MRN, date of birth, ID band Patient awake    Reviewed: Allergy & Precautions, NPO status , Patient's Chart, lab work & pertinent test results  Airway Mallampati: III  TM Distance: >3 FB Neck ROM: Full    Dental  (+) Upper Dentures   Pulmonary sleep apnea and Continuous Positive Airway Pressure Ventilation , former smoker,    Pulmonary exam normal breath sounds clear to auscultation       Cardiovascular hypertension, Pt. on medications and Pt. on home beta blockers Normal cardiovascular exam Rhythm:Regular Rate:Normal  ECG: NSR, rate 62  Sees cardiologist Einar Gip)   Neuro/Psych Anxiety negative neurological ROS     GI/Hepatic negative GI ROS, Neg liver ROS,   Endo/Other  diabetes, Oral Hypoglycemic Agents  Renal/GU CRFRenal disease     Musculoskeletal negative musculoskeletal ROS (+)   Abdominal   Peds  Hematology  (+) anemia , HLD   Anesthesia Other Findings CHRONIC KIDNEY DISEASE STAGE 5  Reproductive/Obstetrics                            Anesthesia Physical Anesthesia Plan  ASA: III  Anesthesia Plan: MAC   Post-op Pain Management:    Induction:   PONV Risk Score and Plan: 1 and Ondansetron, Dexamethasone, Treatment may vary due to age or medical condition and Propofol infusion  Airway Management Planned: Simple Face Mask  Additional Equipment:   Intra-op Plan:   Post-operative Plan:   Informed Consent: I have reviewed the patients History and Physical, chart, labs and discussed the procedure including the risks, benefits and alternatives for the proposed anesthesia with the patient or authorized representative who has indicated his/her understanding and acceptance.     Dental advisory given  Plan Discussed with: CRNA  Anesthesia Plan Comments:       Anesthesia Quick Evaluation

## 2019-03-04 NOTE — Op Note (Signed)
    Patient name: Michael Blackwell MRN: [id number] DOB: Jul 11, 1941 Sex: male  03/04/2019 Pre-operative Diagnosis: ckd Post-operative diagnosis:  Same Surgeon:  Erlene Quan C. Donzetta Matters, MD Assistant: Laurence Slate, PA Procedure Performed: Right arm for stage basilic vein fistula creation  Indications: 78 year old male who is left-hand dominant with chronic kidney disease now indicated for dialysis access.  Findings: There was suitable basilic vein on the right use.  I completion there was a very strong thrill and palpable radial pulse the wrist.   Procedure:  The patient was identified in the holding area and taken to the operating room where is put supine operative table MAC anesthesia induced.  He was sterilely prepped draped right extremity usual fashion antibiotics were minister and timeout was called.  I used ultrasound to identify the basilic vein.  There was no discernible cephalic vein.  The area above the antecubitum was anesthetized 1% lidocaine with epinephrine.  Curvilinear incision was made above the antecubitum.  Identified a very large vein that appeared suitable for use protected the nerve.  Branches were taken during clips and ties.  We dissected deeper through the fascia placed a vessel loop around the brachial artery.  The vein was then marked for orientation transected distally and tied off with silk suture.  Was flushed with heparinized saline dilated to 4 mm and spatulated.  The artery was clamped distally proximally and opened longitudinally and flushed with heparinized saline both directions.  We then sewed the vein end-to-side with 6-0 Prolene suture.  Prior to completion anastomosis without flushing all directions.  Upon completion there was a very strong thrill in the vein as well as palpable radial pulse at the wrist both these were confirmed with Doppler.  We irrigated the wound obtain hemostasis closed in layers Vicryl Monocryl.  Dermabond placed to level skin.  He tolerated procedure  without immediate complication.  All counts were correct at completion.  EBL: 10 cc   Ohana Birdwell C. Donzetta Matters, MD [facility] [place] Office: [phone] Pager: [phone]

## 2019-03-04 NOTE — Discharge Instructions (Signed)
° °  [facility] ° °Discharge Instructions ° °AV Fistula or Graft Surgery for Dialysis Access ° °Please refer to the following instructions for your post-procedure care. Your surgeon or physician assistant will discuss any changes with you. ° °Activity ° °You may drive the day following your surgery, if you are comfortable and no longer taking prescription pain medication. Resume full activity as the soreness in your incision resolves. ° °Bathing/Showering ° °You may shower after you go home. Keep your incision dry for 48 hours. Do not soak in a bathtub, hot tub, or swim until the incision heals completely. You may not shower if you have a hemodialysis catheter. ° °Incision Care ° °Clean your incision with mild soap and water after 48 hours. Pat the area dry with a clean towel. You do not need a bandage unless otherwise instructed. Do not apply any ointments or creams to your incision. You may have skin glue on your incision. Do not peel it off. It will come off on its own in about one week. Your arm may swell a bit after surgery. To reduce swelling use pillows to elevate your arm so it is above your heart. Your doctor will tell you if you need to lightly wrap your arm with an ACE bandage. ° °Diet ° °Resume your normal diet. There are not special food restrictions following this procedure. In order to heal from your surgery, it is CRITICAL to get adequate nutrition. Your body requires vitamins, minerals, and protein. Vegetables are the best source of vitamins and minerals. Vegetables also provide the perfect balance of protein. Processed food has little nutritional value, so try to avoid this. ° °Medications ° °Resume taking all of your medications. If your incision is causing pain, you may take over-the counter pain relievers such as acetaminophen (Tylenol). If you were prescribed a stronger pain medication, please be aware these medications can cause nausea and constipation. Prevent  nausea by taking the medication with a snack or meal. Avoid constipation by drinking plenty of fluids and eating foods with high amount of fiber, such as fruits, vegetables, and grains. Do not take Tylenol if you are taking prescription pain medications. ° ° ° ° °Follow up °Your surgeon may want to see you in the office following your access surgery. If so, this will be arranged at the time of your surgery. ° °Please call us immediately for any of the following conditions: ° °Increased pain, redness, drainage (pus) from your incision site °Fever of 101 degrees or higher °Severe or worsening pain at your incision site °Hand pain or numbness. ° °Reduce your risk of vascular disease: ° °Stop smoking. If you would like help, call [facility] at 1-800-QUIT-NOW (1-800-784-8669) or [place] at [phone] ° °Manage your cholesterol °Maintain a desired weight °Control your diabetes °Keep your blood pressure down ° °Dialysis ° °It will take several weeks to several months for your new dialysis access to be ready for use. Your surgeon will determine when it is OK to use it. Your nephrologist will continue to direct your dialysis. You can continue to use your Permcath until your new access is ready for use. ° °If you have any questions, please call the office at [phone]. ° °

## 2019-03-04 NOTE — Transfer of Care (Signed)
Immediate Anesthesia Transfer of Care Note  Patient: Michael Blackwell  Procedure(s) Performed: RIGHT ARM ARTERIOVENOUS  FISTULA CREATION (Right Arm Lower)  Patient Location: PACU  Anesthesia Type:MAC  Level of Consciousness: drowsy  Airway & Oxygen Therapy: Patient Spontanous Breathing and Patient connected to face mask oxygen  Post-op Assessment: Report given to RN and Post -op Vital signs reviewed and stable  Post vital signs: Reviewed and stable  Last Vitals:  Vitals Value Taken Time  BP 146/64 03/04/2019  9:35 AM  Temp    Pulse 62 03/04/2019  9:37 AM  Resp 13 03/04/2019  9:37 AM  SpO2 99 % 03/04/2019  9:37 AM  Vitals shown include unvalidated device data.  Last Pain:  Vitals:   03/04/19 0659  TempSrc: Oral      Patients Stated Pain Goal: 2 (09/64/38 3818)  Complications: No apparent anesthesia complications

## 2019-03-05 ENCOUNTER — Encounter (HOSPITAL_COMMUNITY): Payer: Self-pay | Admitting: Vascular Surgery

## 2019-03-09 ENCOUNTER — Other Ambulatory Visit: Payer: Self-pay

## 2019-03-09 ENCOUNTER — Encounter (HOSPITAL_COMMUNITY)
Admission: RE | Admit: 2019-03-09 | Discharge: 2019-03-09 | Disposition: A | Discharge status: Home / Self Care | Payer: Medicare Other | Source: Ambulatory Visit | Attending: Nephrology | Admitting: Nephrology

## 2019-03-09 VITALS — BP 148/65 | HR 65 | Temp 97.3°F | Resp 20

## 2019-03-09 DIAGNOSIS — N184 Chronic kidney disease, stage 4 (severe): Secondary | ICD-10-CM | POA: Diagnosis present

## 2019-03-09 DIAGNOSIS — D508 Other iron deficiency anemias: Secondary | ICD-10-CM | POA: Diagnosis present

## 2019-03-09 DIAGNOSIS — D631 Anemia in chronic kidney disease: Secondary | ICD-10-CM | POA: Insufficient documentation

## 2019-03-09 DIAGNOSIS — I701 Atherosclerosis of renal artery: Secondary | ICD-10-CM | POA: Diagnosis not present

## 2019-03-09 LAB — RENAL FUNCTION PANEL
Albumin: 3.2 g/dL — ABNORMAL LOW (ref 3.5–5.0)
Anion gap: 10 (ref 5–15)
BUN: 65 mg/dL — ABNORMAL HIGH (ref 8–23)
CO2: 23 mmol/L (ref 22–32)
Calcium: 8.9 mg/dL (ref 8.9–10.3)
Chloride: 108 mmol/L (ref 98–111)
Creatinine, Ser: 6.12 mg/dL — ABNORMAL HIGH (ref 0.61–1.24)
GFR calc Af Amer: 10 mL/min — ABNORMAL LOW (ref >60)
GFR calc non Af Amer: 8 mL/min — ABNORMAL LOW (ref >60)
Glucose, Bld: 123 mg/dL — ABNORMAL HIGH (ref 70–99)
Phosphorus: 5.1 mg/dL — ABNORMAL HIGH (ref 2.5–4.6)
Potassium: 4.6 mmol/L (ref 3.5–5.1)
Sodium: 141 mmol/L (ref 135–145)

## 2019-03-09 LAB — CBC WITH DIFFERENTIAL/PLATELET
Abs Immature Granulocytes: 0.02 10*3/uL (ref 0.00–0.07)
Basophils Absolute: 0 10*3/uL (ref 0.0–0.1)
Basophils Relative: 1 %
Eosinophils Absolute: 0.1 10*3/uL (ref 0.0–0.5)
Eosinophils Relative: 1 %
HCT: 26.6 % — ABNORMAL LOW (ref 39.0–52.0)
Hemoglobin: 8.3 g/dL — ABNORMAL LOW (ref 13.0–17.0)
Immature Granulocytes: 0 %
Lymphocytes Relative: 30 %
Lymphs Abs: 1.6 10*3/uL (ref 0.7–4.0)
MCH: 26.9 pg (ref 26.0–34.0)
MCHC: 31.2 g/dL (ref 30.0–36.0)
MCV: 86.4 fL (ref 80.0–100.0)
Monocytes Absolute: 0.5 10*3/uL (ref 0.1–1.0)
Monocytes Relative: 9 %
Neutro Abs: 3 10*3/uL (ref 1.7–7.7)
Neutrophils Relative %: 59 %
Platelets: 213 10*3/uL (ref 150–400)
RBC: 3.08 MIL/uL — ABNORMAL LOW (ref 4.22–5.81)
RDW: 15.6 % — ABNORMAL HIGH (ref 11.5–15.5)
WBC: 5.2 10*3/uL (ref 4.0–10.5)
nRBC: 0 % (ref 0.0–0.2)

## 2019-03-09 LAB — POCT HEMOGLOBIN-HEMACUE: Hemoglobin: 8.5 g/dL — ABNORMAL LOW (ref 13.0–17.0)

## 2019-03-09 MED ORDER — DARBEPOETIN ALFA 60 MCG/0.3ML IJ SOSY
PREFILLED_SYRINGE | INTRAMUSCULAR | Status: AC
  Administered 2019-03-09: 09:00:00 60 ug via SUBCUTANEOUS
  Filled 2019-03-09: qty 0.3

## 2019-03-09 MED ORDER — DARBEPOETIN ALFA 60 MCG/0.3ML IJ SOSY
60.0000 ug | PREFILLED_SYRINGE | INTRAMUSCULAR | Status: DC
  Administered 2019-03-09: 09:00:00 60 ug via SUBCUTANEOUS

## 2019-03-10 LAB — PTH, INTACT AND CALCIUM
Calcium, Total (PTH): 8.8 mg/dL (ref 8.6–10.2)
PTH: 119 pg/mL — ABNORMAL HIGH (ref 15–65)

## 2019-04-06 ENCOUNTER — Other Ambulatory Visit: Payer: Self-pay

## 2019-04-06 ENCOUNTER — Observation Stay (HOSPITAL_COMMUNITY)
Admission: EM | Admit: 2019-04-06 | Discharge: 2019-04-07 | Disposition: A | Discharge status: Home / Self Care | Payer: Medicare Other | Attending: Internal Medicine | Admitting: Internal Medicine

## 2019-04-06 ENCOUNTER — Encounter (HOSPITAL_COMMUNITY): Payer: Self-pay | Admitting: *Deleted

## 2019-04-06 ENCOUNTER — Ambulatory Visit (HOSPITAL_COMMUNITY)
Admission: RE | Admit: 2019-04-06 | Discharge: 2019-04-06 | Disposition: A | Discharge status: Home / Self Care | Payer: Medicare Other | Source: Ambulatory Visit | Attending: Nephrology | Admitting: Nephrology

## 2019-04-06 VITALS — BP 106/54 | HR 64 | Temp 97.2°F | Resp 20

## 2019-04-06 DIAGNOSIS — D649 Anemia, unspecified: Secondary | ICD-10-CM | POA: Diagnosis not present

## 2019-04-06 DIAGNOSIS — E785 Hyperlipidemia, unspecified: Secondary | ICD-10-CM | POA: Diagnosis not present

## 2019-04-06 DIAGNOSIS — Z1159 Encounter for screening for other viral diseases: Secondary | ICD-10-CM | POA: Insufficient documentation

## 2019-04-06 DIAGNOSIS — N186 End stage renal disease: Secondary | ICD-10-CM | POA: Diagnosis present

## 2019-04-06 DIAGNOSIS — Z87891 Personal history of nicotine dependence: Secondary | ICD-10-CM | POA: Insufficient documentation

## 2019-04-06 DIAGNOSIS — I120 Hypertensive chronic kidney disease with stage 5 chronic kidney disease or end stage renal disease: Secondary | ICD-10-CM | POA: Insufficient documentation

## 2019-04-06 DIAGNOSIS — F419 Anxiety disorder, unspecified: Secondary | ICD-10-CM | POA: Insufficient documentation

## 2019-04-06 DIAGNOSIS — D631 Anemia in chronic kidney disease: Secondary | ICD-10-CM | POA: Diagnosis not present

## 2019-04-06 DIAGNOSIS — I701 Atherosclerosis of renal artery: Secondary | ICD-10-CM | POA: Insufficient documentation

## 2019-04-06 DIAGNOSIS — E1122 Type 2 diabetes mellitus with diabetic chronic kidney disease: Secondary | ICD-10-CM | POA: Insufficient documentation

## 2019-04-06 DIAGNOSIS — Z8546 Personal history of malignant neoplasm of prostate: Secondary | ICD-10-CM | POA: Diagnosis not present

## 2019-04-06 DIAGNOSIS — G4733 Obstructive sleep apnea (adult) (pediatric): Secondary | ICD-10-CM | POA: Insufficient documentation

## 2019-04-06 DIAGNOSIS — M1990 Unspecified osteoarthritis, unspecified site: Secondary | ICD-10-CM | POA: Insufficient documentation

## 2019-04-06 DIAGNOSIS — Z79899 Other long term (current) drug therapy: Secondary | ICD-10-CM | POA: Diagnosis not present

## 2019-04-06 DIAGNOSIS — E7800 Pure hypercholesterolemia, unspecified: Secondary | ICD-10-CM | POA: Insufficient documentation

## 2019-04-06 DIAGNOSIS — Z7982 Long term (current) use of aspirin: Secondary | ICD-10-CM | POA: Diagnosis not present

## 2019-04-06 DIAGNOSIS — D508 Other iron deficiency anemias: Secondary | ICD-10-CM | POA: Insufficient documentation

## 2019-04-06 DIAGNOSIS — E1129 Type 2 diabetes mellitus with other diabetic kidney complication: Secondary | ICD-10-CM | POA: Diagnosis present

## 2019-04-06 LAB — COMPREHENSIVE METABOLIC PANEL
ALT: 16 U/L (ref 0–44)
AST: 11 U/L — ABNORMAL LOW (ref 15–41)
Albumin: 3.2 g/dL — ABNORMAL LOW (ref 3.5–5.0)
Alkaline Phosphatase: 28 U/L — ABNORMAL LOW (ref 38–126)
Anion gap: 8 (ref 5–15)
BUN: 97 mg/dL — ABNORMAL HIGH (ref 8–23)
CO2: 21 mmol/L — ABNORMAL LOW (ref 22–32)
Calcium: 9 mg/dL (ref 8.9–10.3)
Chloride: 110 mmol/L (ref 98–111)
Creatinine, Ser: 6.48 mg/dL — ABNORMAL HIGH (ref 0.61–1.24)
GFR calc Af Amer: 9 mL/min — ABNORMAL LOW (ref >60)
GFR calc non Af Amer: 8 mL/min — ABNORMAL LOW (ref >60)
Glucose, Bld: 158 mg/dL — ABNORMAL HIGH (ref 70–99)
Potassium: 4.9 mmol/L (ref 3.5–5.1)
Sodium: 139 mmol/L (ref 135–145)
Total Bilirubin: 0.4 mg/dL (ref 0.3–1.2)
Total Protein: 5.8 g/dL — ABNORMAL LOW (ref 6.5–8.1)

## 2019-04-06 LAB — RENAL FUNCTION PANEL
Albumin: 3.2 g/dL — ABNORMAL LOW (ref 3.5–5.0)
Anion gap: 11 (ref 5–15)
BUN: 98 mg/dL — ABNORMAL HIGH (ref 8–23)
CO2: 22 mmol/L (ref 22–32)
Calcium: 8.8 mg/dL — ABNORMAL LOW (ref 8.9–10.3)
Chloride: 107 mmol/L (ref 98–111)
Creatinine, Ser: 6.77 mg/dL — ABNORMAL HIGH (ref 0.61–1.24)
GFR calc Af Amer: 8 mL/min — ABNORMAL LOW (ref >60)
GFR calc non Af Amer: 7 mL/min — ABNORMAL LOW (ref >60)
Glucose, Bld: 153 mg/dL — ABNORMAL HIGH (ref 70–99)
Phosphorus: 4.4 mg/dL (ref 2.5–4.6)
Potassium: 4.8 mmol/L (ref 3.5–5.1)
Sodium: 140 mmol/L (ref 135–145)

## 2019-04-06 LAB — SARS CORONAVIRUS 2 BY RT PCR (HOSPITAL ORDER, PERFORMED IN ~~LOC~~ HOSPITAL LAB): SARS Coronavirus 2: NEGATIVE

## 2019-04-06 LAB — CBC WITH DIFFERENTIAL/PLATELET
Abs Immature Granulocytes: 0.02 10*3/uL (ref 0.00–0.07)
Abs Immature Granulocytes: 0.02 10*3/uL (ref 0.00–0.07)
Basophils Absolute: 0 10*3/uL (ref 0.0–0.1)
Basophils Absolute: 0 10*3/uL (ref 0.0–0.1)
Basophils Relative: 1 %
Basophils Relative: 1 %
Eosinophils Absolute: 0.3 10*3/uL (ref 0.0–0.5)
Eosinophils Absolute: 0.2 10*3/uL (ref 0.0–0.5)
Eosinophils Relative: 4 %
Eosinophils Relative: 4 %
HCT: 17.4 % — ABNORMAL LOW (ref 39.0–52.0)
HCT: 17.8 % — ABNORMAL LOW (ref 39.0–52.0)
Hemoglobin: 5.4 g/dL — CL (ref 13.0–17.0)
Hemoglobin: 5.4 g/dL — CL (ref 13.0–17.0)
Immature Granulocytes: 0 %
Immature Granulocytes: 0 %
Lymphocytes Relative: 24 %
Lymphocytes Relative: 26 %
Lymphs Abs: 1.5 10*3/uL (ref 0.7–4.0)
Lymphs Abs: 1.6 10*3/uL (ref 0.7–4.0)
MCH: 27.6 pg (ref 26.0–34.0)
MCH: 27.1 pg (ref 26.0–34.0)
MCHC: 31 g/dL (ref 30.0–36.0)
MCHC: 30.3 g/dL (ref 30.0–36.0)
MCV: 88.8 fL (ref 80.0–100.0)
MCV: 89.4 fL (ref 80.0–100.0)
Monocytes Absolute: 0.4 10*3/uL (ref 0.1–1.0)
Monocytes Absolute: 0.4 10*3/uL (ref 0.1–1.0)
Monocytes Relative: 7 %
Monocytes Relative: 6 %
Neutro Abs: 3.8 10*3/uL (ref 1.7–7.7)
Neutro Abs: 3.8 10*3/uL (ref 1.7–7.7)
Neutrophils Relative %: 64 %
Neutrophils Relative %: 63 %
Platelets: 205 10*3/uL (ref 150–400)
Platelets: 194 10*3/uL (ref 150–400)
RBC: 1.96 MIL/uL — ABNORMAL LOW (ref 4.22–5.81)
RBC: 1.99 MIL/uL — ABNORMAL LOW (ref 4.22–5.81)
RDW: 16.6 % — ABNORMAL HIGH (ref 11.5–15.5)
RDW: 16.4 % — ABNORMAL HIGH (ref 11.5–15.5)
WBC: 6 10*3/uL (ref 4.0–10.5)
WBC: 6 10*3/uL (ref 4.0–10.5)
nRBC: 0 % (ref 0.0–0.2)
nRBC: 0 % (ref 0.0–0.2)

## 2019-04-06 LAB — PREPARE RBC (CROSSMATCH)

## 2019-04-06 LAB — HEMOGLOBIN AND HEMATOCRIT, BLOOD
HCT: 23.7 % — ABNORMAL LOW (ref 39.0–52.0)
Hemoglobin: 7.9 g/dL — ABNORMAL LOW (ref 13.0–17.0)

## 2019-04-06 LAB — PROTIME-INR
INR: 1.1 (ref 0.8–1.2)
Prothrombin Time: 14 seconds (ref 11.4–15.2)

## 2019-04-06 LAB — IRON AND TIBC
Iron: 51 ug/dL (ref 45–182)
Saturation Ratios: 22 % (ref 17.9–39.5)
TIBC: 232 ug/dL — ABNORMAL LOW (ref 250–450)
UIBC: 181 ug/dL

## 2019-04-06 LAB — FERRITIN: Ferritin: 100 ng/mL (ref 24–336)

## 2019-04-06 LAB — ABO/RH: ABO/RH(D): A POS

## 2019-04-06 MED ORDER — POLYETHYLENE GLYCOL 3350 17 G PO PACK: 17.0000 g | PACK | Freq: Every day | ORAL | Status: DC | PRN

## 2019-04-06 MED ORDER — CLONIDINE HCL 0.1 MG PO TABS
0.1000 mg | ORAL_TABLET | Freq: Two times a day (BID) | ORAL | Status: DC
  Administered 2019-04-06 – 2019-04-07 (×2): 0.1 mg via ORAL
  Filled 2019-04-06 (×2): qty 1

## 2019-04-06 MED ORDER — FUROSEMIDE 40 MG PO TABS
60.0000 mg | ORAL_TABLET | Freq: Two times a day (BID) | ORAL | Status: DC
  Administered 2019-04-06 – 2019-04-07 (×2): 60 mg via ORAL
  Filled 2019-04-06 (×2): qty 1

## 2019-04-06 MED ORDER — ISOSORB DINITRATE-HYDRALAZINE 20-37.5 MG PO TABS
2.0000 | ORAL_TABLET | Freq: Three times a day (TID) | ORAL | Status: DC
  Administered 2019-04-06 – 2019-04-07 (×3): 2 via ORAL
  Filled 2019-04-06 (×3): qty 2

## 2019-04-06 MED ORDER — IRON 325 (65 FE) MG PO TABS: 1.0000 | ORAL_TABLET | Freq: Two times a day (BID) | ORAL | Status: DC

## 2019-04-06 MED ORDER — ROSUVASTATIN CALCIUM 5 MG PO TABS
10.0000 mg | ORAL_TABLET | Freq: Every day | ORAL | Status: DC
  Administered 2019-04-06 – 2019-04-07 (×2): 10 mg via ORAL
  Filled 2019-04-06 (×2): qty 2

## 2019-04-06 MED ORDER — CALCIUM CARBONATE-VITAMIN D 500-200 MG-UNIT PO TABS
1.0000 | ORAL_TABLET | Freq: Every day | ORAL | Status: DC
  Administered 2019-04-06 – 2019-04-07 (×2): 1 via ORAL
  Filled 2019-04-06 (×2): qty 1

## 2019-04-06 MED ORDER — ISOSORBIDE MONONITRATE ER 30 MG PO TB24
30.0000 mg | ORAL_TABLET | Freq: Every day | ORAL | Status: DC
  Administered 2019-04-07: 30 mg via ORAL
  Filled 2019-04-06: qty 1

## 2019-04-06 MED ORDER — SODIUM CHLORIDE 0.9% FLUSH
3.0000 mL | Freq: Two times a day (BID) | INTRAVENOUS | Status: DC
  Administered 2019-04-06 – 2019-04-07 (×2): 3 mL via INTRAVENOUS

## 2019-04-06 MED ORDER — OMEGA-3-ACID ETHYL ESTERS 1 G PO CAPS
1.0000 g | ORAL_CAPSULE | Freq: Every day | ORAL | Status: DC
  Administered 2019-04-07: 1 g via ORAL
  Filled 2019-04-06: qty 1

## 2019-04-06 MED ORDER — ZOLPIDEM TARTRATE 5 MG PO TABS: 5.0000 mg | ORAL_TABLET | Freq: Every evening | ORAL | Status: DC | PRN

## 2019-04-06 MED ORDER — HEPARIN SODIUM (PORCINE) 5000 UNIT/ML IJ SOLN
5000.0000 [IU] | Freq: Three times a day (TID) | INTRAMUSCULAR | Status: DC
  Administered 2019-04-06 – 2019-04-07 (×3): 5000 [IU] via SUBCUTANEOUS
  Filled 2019-04-06 (×3): qty 1

## 2019-04-06 MED ORDER — POTASSIUM CHLORIDE CRYS ER 20 MEQ PO TBCR
20.0000 meq | EXTENDED_RELEASE_TABLET | Freq: Every day | ORAL | Status: DC
  Administered 2019-04-07: 20 meq via ORAL
  Filled 2019-04-06: qty 1

## 2019-04-06 MED ORDER — CALCITRIOL 0.25 MCG PO CAPS
0.2500 ug | ORAL_CAPSULE | Freq: Every day | ORAL | Status: DC
  Administered 2019-04-06 – 2019-04-07 (×2): 0.25 ug via ORAL
  Filled 2019-04-06 (×2): qty 1

## 2019-04-06 MED ORDER — ACETAMINOPHEN 325 MG PO TABS: 650.0000 mg | ORAL_TABLET | Freq: Four times a day (QID) | ORAL | Status: DC | PRN

## 2019-04-06 MED ORDER — AMLODIPINE BESYLATE 10 MG PO TABS
10.0000 mg | ORAL_TABLET | Freq: Every day | ORAL | Status: DC
  Administered 2019-04-07: 10 mg via ORAL
  Filled 2019-04-06: qty 1

## 2019-04-06 MED ORDER — SODIUM CHLORIDE 0.9 % IV SOLN: 10.0000 mL/h | Freq: Once | INTRAVENOUS | Status: DC

## 2019-04-06 MED ORDER — CALCIUM CARB-CHOLECALCIFEROL 600-800 MG-UNIT PO TABS: 1.0000 | ORAL_TABLET | Freq: Two times a day (BID) | ORAL | Status: DC

## 2019-04-06 MED ORDER — ONDANSETRON HCL 4 MG PO TABS: 4.0000 mg | ORAL_TABLET | Freq: Four times a day (QID) | ORAL | Status: DC | PRN

## 2019-04-06 MED ORDER — FERROUS SULFATE 325 (65 FE) MG PO TABS
325.0000 mg | ORAL_TABLET | Freq: Two times a day (BID) | ORAL | Status: DC
  Administered 2019-04-06 – 2019-04-07 (×2): 325 mg via ORAL
  Filled 2019-04-06 (×2): qty 1

## 2019-04-06 MED ORDER — OXYCODONE HCL 5 MG PO TABS: 5.0000 mg | ORAL_TABLET | ORAL | Status: DC | PRN

## 2019-04-06 MED ORDER — DARBEPOETIN ALFA 60 MCG/0.3ML IJ SOSY
PREFILLED_SYRINGE | INTRAMUSCULAR | Status: AC
  Filled 2019-04-06: qty 0.3

## 2019-04-06 MED ORDER — FUROSEMIDE 10 MG/ML IJ SOLN
60.0000 mg | Freq: Once | INTRAMUSCULAR | Status: AC
  Administered 2019-04-06: 60 mg via INTRAVENOUS
  Filled 2019-04-06: qty 6

## 2019-04-06 MED ORDER — CARVEDILOL 12.5 MG PO TABS
12.5000 mg | ORAL_TABLET | Freq: Two times a day (BID) | ORAL | Status: DC
  Administered 2019-04-06 – 2019-04-07 (×2): 12.5 mg via ORAL
  Filled 2019-04-06 (×2): qty 1

## 2019-04-06 MED ORDER — TAMSULOSIN HCL 0.4 MG PO CAPS
0.4000 mg | ORAL_CAPSULE | Freq: Every day | ORAL | Status: DC
  Administered 2019-04-06: 22:00:00 0.4 mg via ORAL
  Filled 2019-04-06: qty 1

## 2019-04-06 MED ORDER — ONDANSETRON HCL 4 MG/2ML IJ SOLN: 4.0000 mg | Freq: Four times a day (QID) | INTRAMUSCULAR | Status: DC | PRN

## 2019-04-06 MED ORDER — VITAMIN C 500 MG PO TABS
250.0000 mg | ORAL_TABLET | Freq: Two times a day (BID) | ORAL | Status: DC
  Administered 2019-04-06 – 2019-04-07 (×3): 250 mg via ORAL
  Filled 2019-04-06 (×3): qty 1

## 2019-04-06 MED ORDER — ADULT MULTIVITAMIN W/MINERALS CH
1.0000 | ORAL_TABLET | Freq: Every day | ORAL | Status: DC
  Administered 2019-04-07: 1 via ORAL
  Filled 2019-04-06: qty 1

## 2019-04-06 MED ORDER — DARBEPOETIN ALFA 60 MCG/0.3ML IJ SOSY
60.0000 ug | PREFILLED_SYRINGE | INTRAMUSCULAR | Status: DC
  Administered 2019-04-06: 60 ug via SUBCUTANEOUS

## 2019-04-06 MED ORDER — OMEGA 3 1000 MG PO CAPS: ORAL_CAPSULE | Freq: Every day | ORAL | Status: DC

## 2019-04-06 MED ORDER — ASPIRIN EC 81 MG PO TBEC
81.0000 mg | DELAYED_RELEASE_TABLET | Freq: Every day | ORAL | Status: DC
  Administered 2019-04-06: 22:00:00 81 mg via ORAL
  Filled 2019-04-06 (×2): qty 1

## 2019-04-06 MED ORDER — CENTRUM SILVER 50+MEN PO TABS: 1.0000 | ORAL_TABLET | Freq: Every day | ORAL | Status: DC

## 2019-04-06 MED ORDER — ACETAMINOPHEN 650 MG RE SUPP: 650.0000 mg | Freq: Four times a day (QID) | RECTAL | Status: DC | PRN

## 2019-04-06 NOTE — H&P (Signed)
History and Physical    Michael Blackwell IWO:[id number] DOB: [birth year] DOA: 04/06/2019  PCP: Rogers Blocker, MD  Patient coming from: Nephrology office  I have personally briefly reviewed patient's old medical records in [place]  Chief Complaint: Low hemoglobin  HPI: Michael Blackwell is a 78 y.o. 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  Patient presents to the ED at the behest of his doctor who obtained blood work this morning after dialysis graft placement.  He was found to have a hemoglobin of 5.4.  To the emergency department for further evaluation and management.  Patient denies any chest pain, lightheadedness, syncope, nausea, and vomiting.  He does state that he feels weaker than normal.  And it does take him longer to get tasks done.  The patient reports he has taken his medications today.  We have his hemoglobin shows a 3 g drop in his hemoglobin over the past month.  ED Course: Hemoglobin noted to be 5.4.  2 units of packed red blood cells ordered and pending.  Iron studies done assistant with anemia of renal failure.  Review of Systems: As per HPI otherwise all other systems reviewed and  negative.   Past Medical History:  Diagnosis Date  . Acute respiratory failure with hypoxia ([place]) 12/05/2016   [name] 12/05/2016  . Anemia   . Anxiety   . Arthritis    "joints ache at night sometimes" (3/8/20180  . Chronic kidney disease (CKD), stage II (mild)    Acute on chronic kidney disease stage II-III/notes 12/06/2016  . Dyspnea    with much activity  . Heart murmur   . History of radiation therapy 02/25/14- 04/23/14   prostate 7800 cGy 40 sessions, seminal vesicles 5600 cGy 40 sessions  . Hypercholesteremia   . Hypertension   . OSA on CPAP   . Pneumonia 12/06/2016  . Prostate cancer ([place]) 11/19/13   gleason 4+3=7, volume 34.74 cc  . Spermatocele    right  . Type II diabetes  mellitus ([place])    diet controlled    Past Surgical History:  Procedure Laterality Date  . AV FISTULA PLACEMENT Right 03/04/2019   Procedure: RIGHT ARM ARTERIOVENOUS  FISTULA CREATION;  Surgeon: [name], MD;  Location: [facility];  Service: Vascular;  Laterality: Right;  . COLONOSCOPY    . LOWER EXTREMITY ANGIOGRAPHY Bilateral 02/25/2018   Procedure: Lower Extremity Angiography;  Surgeon: [name], MD;  Location: [facility];  Service: Cardiovascular;  Laterality: Bilateral;  limited  . PROSTATE BIOPSY  11/19/13   Gleason 4+3=7, vol 34.74 cc  . RENAL ANGIOGRAPHY N/A 02/25/2018   Procedure: RENAL ANGIOGRAPHY;  Surgeon: [name], MD;  Location: [facility];  Service: Cardiovascular;  Laterality: N/A;  . TONSILLECTOMY     as child    Social History   Social History Narrative  . Not on file     reports that he quit smoking about 9 months ago. His smoking use included cigarettes. He quit after 48.00 years of use. He has never used smokeless tobacco. He reports previous alcohol use. He reports that he does not use drugs.  No Known Allergies  Family History  Problem Relation Age of Onset  . Cancer Brother        brain  . Cancer Mother        stomach  . Cancer Father        ?  prostate     Prior to Admission medications   Medication Sig Start Date End Date Taking? Authorizing Provider  amLODipine (NORVASC) 10 MG tablet Take 1 tablet (10 mg total) by mouth daily. 12/10/16  Yes Dhungel, Nishant, MD  aspirin 81 MG tablet Take 81 mg by mouth at bedtime.    Yes [provider]  BIDIL 20-37.5 MG tablet TAKE TWO TABLETS BY MOUTH THREE TIMES A DAY  Patient taking differently: Take 2 tablets by mouth 3 (three) times daily.  01/26/19  Yes [name], NP  calcitRIOL (ROCALTROL) 0.25 MCG capsule Take 0.25 mcg by mouth daily. 12/02/18  Yes [provider]  Calcium Carb-Cholecalciferol (CALCIUM 600+D) 600-800 MG-UNIT TABS  Take 1 tablet by mouth 2 (two) times a day.   Yes [provider]  carvedilol (COREG) 12.5 MG tablet Take 12.5 mg by mouth 2 (two) times daily. 12/31/18  Yes [provider]  cloNIDine (CATAPRES) 0.1 MG tablet Take 0.1 mg by mouth 2 (two) times daily.  12/12/15  Yes [provider]  Ferrous Sulfate (IRON) 325 (65 Fe) MG TABS TAKE 1 TABLET BY MOUTH TWICE DAILY Patient taking differently: Take 325 mg by mouth daily.  12/02/18  Yes [name], NP  furosemide (LASIX) 40 MG tablet Take 1.5 tablets (60 mg total) by mouth 2 (two) times daily. 12/10/16  Yes Dhungel, Nishant, MD  isosorbide mononitrate (IMDUR) 30 MG 24 hr tablet Take 1 tablet (30 mg total) by mouth daily. 12/10/16  Yes Dhungel, Nishant, MD  Multiple Vitamins-Minerals (CENTRUM SILVER 50+MEN) TABS Take 1 tablet by mouth daily.   Yes [provider]  Omega-3 Fatty Acids (OMEGA 3 PO) Take 1 capsule by mouth daily.   Yes [provider]  phenylephrine (NEO-SYNEPHRINE COLD & SINUS) 1 % nasal spray Place 1 drop into both nostrils every 6 (six) hours as needed for congestion.   Yes [provider]  potassium chloride SA (K-DUR,KLOR-CON) 20 MEQ tablet Take 1 tablet (20 mEq total) by mouth daily. 12/10/16  Yes Dhungel, Nishant, MD  rosuvastatin (CRESTOR) 10 MG tablet Take 1 tablet (10 mg total) by mouth daily. 12/10/16  Yes Dhungel, Nishant, MD  tamsulosin (FLOMAX) 0.4 MG CAPS capsule Take 1 capsule (0.4 mg total) by mouth daily. Patient taking differently: Take 0.4 mg by mouth at bedtime.  04/12/14  Yes [name], MD    Physical Exam:  Constitutional: NAD, calm, comfortable Vitals:   04/06/19 1130 04/06/19 1145 04/06/19 1200 04/06/19 1325  BP: (!) 139/55 (!) 137/53 138/60 136/63  Pulse: (!) 59 (!) 55 (!) 57 70  Resp:    16  Temp:    98.6 F (37 C)  TempSrc:    Oral  SpO2: 100% 100% 98% 99%  Weight:      Height:       Eyes: PERRL, lids and conjunctivae pale sclerae are white.  ENMT: Mucous membranes are dry. Posterior pharynx clear of any exudate or lesions.Normal dentition.  Neck: normal, supple, no masses, no thyromegaly Respiratory: clear to auscultation bilaterally, no wheezing, no crackles. Normal respiratory effort. No accessory muscle use.  Cardiovascular: Regular rate and rhythm, no murmurs / rubs / gallops. No extremity edema. 2+ pedal pulses. No carotid bruits.  Abdomen: no tenderness, no masses palpated. No hepatosplenomegaly. Bowel sounds positive.  Musculoskeletal: no clubbing / cyanosis. No joint deformity upper and lower extremities. Good ROM, no contractures. Normal muscle tone.  Skin: no rashes, lesions, ulcers. No induration, palms are pale. Neurologic: CN  2-12 grossly intact. Sensation intact, DTR normal. Strength 5/5 in all 4.  Psychiatric: Normal judgment and insight. Alert and oriented x 3. Normal mood.    Labs on Admission: I have personally reviewed following labs and imaging studies  CBC: Recent Labs  Lab 04/06/19 0851 04/06/19 1040  WBC 6.0 6.0  NEUTROABS 3.8 3.8  HGB 5.4* 5.4*  HCT 17.4* 17.8*  MCV 88.8 89.4  PLT 205 096   Basic Metabolic Panel: Recent Labs  Lab 04/06/19 0852 04/06/19 1040  NA 140 139  K 4.8 4.9  CL 107 110  CO2 22 21*  GLUCOSE 153* 158*  BUN 98* 97*  CREATININE 6.77* 6.48*  CALCIUM 8.8* 9.0  PHOS 4.4  --    GFR: Estimated Creatinine Clearance: 10.5 mL/min (A) (by C-G formula based on SCr of 6.48 mg/dL (H)). Liver Function Tests: Recent Labs  Lab 04/06/19 0852 04/06/19 1040  AST  --  11*  ALT  --  16  ALKPHOS  --  28*  BILITOT  --  0.4  PROT  --  5.8*  ALBUMIN 3.2* 3.2*   Coagulation Profile: Recent Labs  Lab 04/06/19 1040  INR 1.1   Anemia Panel: Recent Labs    04/06/19 0851  FERRITIN 100  TIBC 232*  IRON 51   Urine analysis: No results found for: COLORURINE, APPEARANCEUR, LABSPEC, PHURINE, GLUCOSEU, HGBUR, BILIRUBINUR, KETONESUR, PROTEINUR, UROBILINOGEN, NITRITE,  LEUKOCYTESUR  Radiological Exams on Admission: No results found.    Assessment/Plan Principal Problem:   Symptomatic anemia Active Problems:   Anemia due to end stage renal disease (HCC)   Type 2 diabetes mellitus with renal manifestations (HCC)   Hyperlipidemia LDL goal <100    1.  Symptomatic anemia due to anemia of end-stage renal disease: We will transfuse 2 units of packed red blood cells.  Will give 60 mg of IV Lasix 2 hours into the first unit.  Monitor symptoms especially shortness of breath regarding possible volume overload.  2.  2 diabetes mellitus with renal manifestations: Adding scale insulin coverage with home medications reordered.  3.  Hyperlipidemia: Continue home medications.  4.  End-stage renal disease: Patient preparing for dialysis with graft placement.  Follow-up with nephrology.  DVT prophylaxis: Subcu heparin Code Status: Full code Family Communication: Keane Martelli, spouse at [phone] 1.  Left message to call. Disposition Plan: Home in a.m. Consults called: None Admission status: Ovation It is my clinical opinion that referral for OBSERVATION is reasonable and necessary in this patient based on the above information provided. The aforementioned taken together are felt to place the patient at high risk for further clinical deterioration. However it is anticipated that the patient may be medically stable for discharge from the hospital within 24 to 48 hours.   Lady Deutscher MD FACP [facility] Pager [phone]  How to contact the [facility] Attending or Consulting provider [place] or covering provider during after hours [place], for this patient?  1. Check the care team in [facility] and look for a) attending/consulting [facility] provider listed and b) the [facility] team listed 2. Log into [email or web address] and use 's universal password to access. If you do not have the password, please contact the hospital operator. 3. Locate the [facility] provider you are  looking for under [facility] and page to a number that you can be directly reached. 4. If you still have difficulty reaching the provider, please page the [facility] (Director on Call) for the Hospitalists listed on amion  for assistance.  If 7PM-7AM, please contact night-coverage [email or web address] Password [email or web address]  04/06/2019, 1:45 PM

## 2019-04-06 NOTE — Progress Notes (Signed)
1st unit of PRBC was stopped at 1610.

## 2019-04-06 NOTE — Progress Notes (Signed)
Hemocue today read 5.3, double checked result and got 5.4.  CBC to confirm was 5.4.  Pt states he is tired cold and short of breath.  Orders received to give aranesp injection and send pt to ER for evaluation by Dr Marval Regal

## 2019-04-06 NOTE — Progress Notes (Signed)
Received pt from [employer]. Pt is alert, orented and has blood running at 125.  Pt was oriented to room and orders.

## 2019-04-06 NOTE — ED Triage Notes (Signed)
PT had monthly injections and was told his HGB was low . Pt here to check HGB

## 2019-04-06 NOTE — ED Provider Notes (Signed)
[facility] EMERGENCY DEPARTMENT Provider Note   CSN: [id number] Arrival date & time: 04/06/19  1013    History   Chief Complaint Chief Complaint  Patient presents with  . Abnormal Lab    HPI BUELL PARCEL is a 78 y.o. male.     HPI Patient presents with concern of mild weakness, possible anemia. Patient's multiple medical issues including chronic kidney disease, is preparing for initiation of dialysis. Today, as part of repeat evaluation following recent placement of a right-sided graft, he had labs. He was made aware that these were abnormal, sent here for evaluation peer He denies chest pain, lightheadedness, syncope, nausea, vomiting, acknowledges feeling weaker than usual this morning.  Past Medical History:  Diagnosis Date  . Acute respiratory failure with hypoxia ([place]) 12/05/2016   [name] 12/05/2016  . Anemia   . Anxiety   . Arthritis    "joints ache at night sometimes" (3/8/20180  . Chronic kidney disease (CKD), stage II (mild)    Acute on chronic kidney disease stage II-III/notes 12/06/2016  . Dyspnea    with much activity  . Heart murmur   . History of radiation therapy 02/25/14- 04/23/14   prostate 7800 cGy 40 sessions, seminal vesicles 5600 cGy 40 sessions  . Hypercholesteremia   . Hypertension   . OSA on CPAP   . Pneumonia 12/06/2016  . Prostate cancer ([place]) 11/19/13   gleason 4+3=7, volume 34.74 cc  . Spermatocele    right  . Type II diabetes mellitus ([place])    diet controlled    Patient Active Problem List   Diagnosis Date Noted  . Renal artery stenosis ([place]) 02/24/2018  . Essential hypertension, malignant 12/10/2016  . Hyperlipidemia LDL goal <100 12/10/2016  . Iron deficiency anemia 12/10/2016  . Type 2 diabetes mellitus with renal manifestations ([place]) 12/05/2016  . Malignant neoplasm of prostate ([place]) 01/12/2014    Past Surgical History:  Procedure Laterality Date  . AV FISTULA PLACEMENT Right 03/04/2019   Procedure: RIGHT  ARM ARTERIOVENOUS  FISTULA CREATION;  Surgeon: [name], MD;  Location: [facility];  Service: Vascular;  Laterality: Right;  . COLONOSCOPY    . LOWER EXTREMITY ANGIOGRAPHY Bilateral 02/25/2018   Procedure: Lower Extremity Angiography;  Surgeon: [name], MD;  Location: [facility];  Service: Cardiovascular;  Laterality: Bilateral;  limited  . PROSTATE BIOPSY  11/19/13   Gleason 4+3=7, vol 34.74 cc  . RENAL ANGIOGRAPHY N/A 02/25/2018   Procedure: RENAL ANGIOGRAPHY;  Surgeon: [name], MD;  Location: [facility];  Service: Cardiovascular;  Laterality: N/A;  . TONSILLECTOMY     as child        Home Medications    Prior to Admission medications   Medication Sig Start Date End Date Taking? Authorizing Provider  amLODipine (NORVASC) 10 MG tablet Take 1 tablet (10 mg total) by mouth daily. 12/10/16  Yes Dhungel, Nishant, MD  aspirin 81 MG tablet Take 81 mg by mouth at bedtime.    Yes [provider]  BIDIL 20-37.5 MG tablet TAKE TWO TABLETS BY MOUTH THREE TIMES A DAY  Patient taking differently: Take 2 tablets by mouth 3 (three) times daily.  01/26/19  Yes [name], NP  calcitRIOL (ROCALTROL) 0.25 MCG capsule Take 0.25 mcg by mouth daily. 12/02/18  Yes [provider]  Calcium Carb-Cholecalciferol (CALCIUM 600+D) 600-800 MG-UNIT TABS Take 1 tablet by mouth 2 (two) times a day.   Yes [provider]  carvedilol (COREG) 12.5 MG tablet Take 12.5 mg by mouth 2 (two) times daily. 12/31/18  Yes [provider]  cloNIDine (CATAPRES) 0.1 MG tablet Take 0.1 mg by mouth 2 (two) times daily.  12/12/15  Yes [provider]  Ferrous Sulfate (IRON) 325 (65 Fe) MG TABS TAKE 1 TABLET BY MOUTH TWICE DAILY Patient taking differently: Take 325 mg by mouth daily.  12/02/18  Yes [name], NP  furosemide (LASIX) 40 MG tablet Take 1.5 tablets (60 mg total) by mouth 2 (two) times daily. 12/10/16  Yes Dhungel,  Nishant, MD  isosorbide mononitrate (IMDUR) 30 MG 24 hr tablet Take 1 tablet (30 mg total) by mouth daily. 12/10/16  Yes Dhungel, Nishant, MD  Multiple Vitamins-Minerals (CENTRUM SILVER 50+MEN) TABS Take 1 tablet by mouth daily.   Yes [provider]  Omega-3 Fatty Acids (OMEGA 3 PO) Take 1 capsule by mouth daily.   Yes [provider]  phenylephrine (NEO-SYNEPHRINE COLD & SINUS) 1 % nasal spray Place 1 drop into both nostrils every 6 (six) hours as needed for congestion.   Yes [provider]  potassium chloride SA (K-DUR,KLOR-CON) 20 MEQ tablet Take 1 tablet (20 mEq total) by mouth daily. 12/10/16  Yes Dhungel, Nishant, MD  rosuvastatin (CRESTOR) 10 MG tablet Take 1 tablet (10 mg total) by mouth daily. 12/10/16  Yes Dhungel, Nishant, MD  tamsulosin (FLOMAX) 0.4 MG CAPS capsule Take 1 capsule (0.4 mg total) by mouth daily. Patient taking differently: Take 0.4 mg by mouth at bedtime.  04/12/14  Yes [name], MD  oxyCODONE-acetaminophen (PERCOCET/ROXICET) 5-325 MG tablet Take 1 tablet by mouth every 6 (six) hours as needed. Patient not taking: Reported on 04/06/2019 03/04/19   Ulyses Amor, PA-C    Family History Family History  Problem Relation Age of Onset  . Cancer Brother        brain  . Cancer Mother        stomach  . Cancer Father        ? prostate    Social History Social History   Tobacco Use  . Smoking status: Former Smoker    Years: 48.00    Types: Cigarettes    Quit date: 07/01/2018    Years since quitting: 0.7  . Smokeless tobacco: Never Used  . Tobacco comment: "I smoke 1 cig a month"- 03/03/2019  Substance Use Topics  . Alcohol use: Not Currently  . Drug use: No     Allergies   Patient has no known allergies.   Review of Systems Review of Systems  Constitutional:       Per HPI, otherwise negative  HENT:       Per HPI, otherwise negative  Respiratory:       Per HPI, otherwise negative  Cardiovascular:       Per HPI, otherwise  negative  Gastrointestinal: Negative for vomiting.  Endocrine:       Negative aside from HPI  Genitourinary:       Neg aside from HPI   Musculoskeletal:       Per HPI, otherwise negative  Skin: Negative.   Neurological: Positive for weakness. Negative for syncope.     Physical Exam Updated Vital Signs BP 138/60   Pulse (!) 57   Temp 97.8 F (36.6 C) (Oral)   Resp 13   Ht 5\' 8"  (1.727 m)   Wt 85.7 kg   SpO2 98%   BMI 28.74 kg/m   Physical Exam Vitals signs and  nursing note reviewed.  Constitutional:      General: He is not in acute distress.    Appearance: He is well-developed.  HENT:     Head: Normocephalic and atraumatic.  Eyes:     Conjunctiva/sclera: Conjunctivae normal.  Cardiovascular:     Rate and Rhythm: Normal rate and regular rhythm.     Comments: Good pulses both distal upper extremities Pulmonary:     Effort: Pulmonary effort is normal. No respiratory distress.     Breath sounds: No stridor.  Abdominal:     General: There is no distension.  Skin:    General: Skin is warm and dry.  Neurological:     Mental Status: He is alert and oriented to person, place, and time.      ED Treatments / Results  Labs (all labs ordered are listed, but only abnormal results are displayed) Labs Reviewed  COMPREHENSIVE METABOLIC PANEL - Abnormal; Notable for the following components:      Result Value   CO2 21 (*)    Glucose, Bld 158 (*)    BUN 97 (*)    Creatinine, Ser 6.48 (*)    Total Protein 5.8 (*)    Albumin 3.2 (*)    AST 11 (*)    Alkaline Phosphatase 28 (*)    GFR calc non Af Amer 8 (*)    GFR calc Af Amer 9 (*)    All other components within normal limits  CBC WITH DIFFERENTIAL/PLATELET - Abnormal; Notable for the following components:   RBC 1.99 (*)    Hemoglobin 5.4 (*)    HCT 17.8 (*)    RDW 16.4 (*)    All other components within normal limits  SARS CORONAVIRUS 2 (HOSPITAL ORDER, [facility])  PROTIME-INR  TYPE  AND SCREEN  ABO/RH  PREPARE RBC (CROSSMATCH)    EKG None  Radiology No results found.  Procedures Procedures (including critical care time)  Medications Ordered in ED Medications  0.9 %  sodium chloride infusion (has no administration in time range)     Initial Impression / Assessment and Plan / ED Course  I have reviewed the triage vital signs and the nursing notes.  Pertinent labs & imaging results that were available during my care of the patient were reviewed by me and considered in my medical decision making (see chart for details).    After the initial evaluation I reviewed the patient's chart including documentation from within the past 2 weeks of placement of right-sided graft for dialysis access.     12:49 PM Patient in no distress. Labs notable for hemoglobin value of 5.4. Creatinine elevated consistent with patient's known kidney disease, in preparation for initiation of dialysis. Patient's labs otherwise unremarkable, with no substantial potassium abnormalities. With patient's acknowledged history of kidney dysfunction, recurrent episodes of anemia, the patient will require admission for transfusion COVID test pending, but the patient is awake, alert, afebrile, with no cough, this is low suspicion.   Final Clinical Impressions(s) / ED Diagnoses   Final diagnoses:  Symptomatic anemia   CRITICAL CARE Performed by: Carmin Muskrat Total critical care time: 35 minutes Critical care time was exclusive of separately billable procedures and treating other patients. Critical care was necessary to treat or prevent imminent or life-threatening deterioration. Critical care was time spent personally by me on the following activities: development of treatment plan with patient and/or surrogate as well as nursing, discussions with consultants, evaluation of patient's response to treatment, examination  of patient, obtaining history from patient or surrogate, ordering and  performing treatments and interventions, ordering and review of laboratory studies, ordering and review of radiographic studies, pulse oximetry and re-evaluation of patient's condition.    Carmin Muskrat, MD 04/06/19 1250

## 2019-04-07 DIAGNOSIS — D649 Anemia, unspecified: Secondary | ICD-10-CM | POA: Diagnosis not present

## 2019-04-07 DIAGNOSIS — D631 Anemia in chronic kidney disease: Secondary | ICD-10-CM | POA: Diagnosis not present

## 2019-04-07 LAB — TYPE AND SCREEN
ABO/RH(D): A POS
Antibody Screen: NEGATIVE
Unit division: 0
Unit division: 0

## 2019-04-07 LAB — CBC
HCT: 23 % — ABNORMAL LOW (ref 39.0–52.0)
Hemoglobin: 7.6 g/dL — ABNORMAL LOW (ref 13.0–17.0)
MCH: 27.8 pg (ref 26.0–34.0)
MCHC: 33 g/dL (ref 30.0–36.0)
MCV: 84.2 fL (ref 80.0–100.0)
Platelets: 208 10*3/uL (ref 150–400)
RBC: 2.73 MIL/uL — ABNORMAL LOW (ref 4.22–5.81)
RDW: 16.7 % — ABNORMAL HIGH (ref 11.5–15.5)
WBC: 7.5 10*3/uL (ref 4.0–10.5)
nRBC: 0 % (ref 0.0–0.2)

## 2019-04-07 LAB — BPAM RBC
Blood Product Expiration Date: 202007262359
Blood Product Expiration Date: 202007262359
ISSUE DATE / TIME: 202007061316
ISSUE DATE / TIME: 202007061643
Unit Type and Rh: 6200
Unit Type and Rh: 6200

## 2019-04-07 LAB — HEPATITIS B SURFACE ANTIGEN: Hepatitis B Surface Ag: NEGATIVE

## 2019-04-07 LAB — POCT HEMOGLOBIN-HEMACUE: Hemoglobin: 5.4 g/dL — CL (ref 13.0–17.0)

## 2019-04-07 NOTE — Discharge Summary (Signed)
Physician Discharge Summary  [name]:[id number] DOB: [birth year] DOA: 04/06/2019  PCP: Michael Blocker, MD  Admit date: 04/06/2019 Discharge date: 04/07/2019  Admitted From: home Discharge disposition: home   Recommendations for Outpatient Follow-Up:   1. 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  Diet recommendation: renal  Wound care: None.  Code status: Full.   History of Present Illness:   Michael Blackwell is a 78 y.o. 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  Patient presents to the ED at the behest of his doctor who obtained blood work this morning after dialysis graft placement.  He was found to have a hemoglobin of 5.4.  To the emergency department for further evaluation and management.  Patient denies any chest pain, lightheadedness, syncope, nausea, and vomiting.  He does state that he feels weaker than normal.  And it does take him longer to get tasks done.  The patient reports he has taken his medications today.  We have his hemoglobin shows a 3 g drop in his hemoglobin over the past month.   Hospital Course by Problem:   Symptomatic anemia due to anemia of end-stage renal disease: S/p 2 units of PRBC -fe levels good -s/p arnesp at renal office prior to presenting to ER -CBC 1 week  Type 2 diabetes mellitus with renal manifestations  Hyperlipidemia: Continue home medications.  End-stage renal disease: Patient preparing for dialysis with graft placement.  Follow-up with nephrology.    Medical Consultants:      Discharge Exam:   Vitals:   04/06/19 1938 04/07/19 0523  BP: (!) 147/58 (!) 152/69  Pulse: 66 63   Resp: 14 14  Temp: 98.6 F (37 C) 98.9 F (37.2 C)  SpO2: 99% 100%   Vitals:   04/06/19 1615 04/06/19 1700 04/06/19 1938 04/07/19 0523  BP: (!) 148/69 (!) 162/52 (!) 147/58 (!) 152/69  Pulse: 63 67 66 63  Resp: 18 18 14 14   Temp: 98.3 F (36.8 C) 98.3 F (36.8 C) 98.6 F (37 C) 98.9 F (37.2 C)  TempSrc: Oral Axillary Oral Oral  SpO2: 100% 100% 99% 100%  Weight:      Height:        General exam: Appears calm and comfortable.   The results of significant diagnostics from this hospitalization (including imaging, microbiology, ancillary and laboratory) are listed below for reference.     Procedures and Diagnostic Studies:   No results found.   Labs:   Basic Metabolic Panel: Recent Labs  Lab 04/06/19 0852 04/06/19 1040  NA 140 139  K 4.8 4.9  CL 107 110  CO2 22 21*  GLUCOSE 153* 158*  BUN 98* 97*  CREATININE 6.77* 6.48*  CALCIUM 8.8* 9.0  PHOS 4.4  --    GFR Estimated Creatinine Clearance: 10.7 mL/min (A) (by C-G formula based on SCr of 6.48 mg/dL (H)). Liver Function Tests: Recent Labs  Lab 04/06/19 0852 04/06/19 1040  AST  --  11*  ALT  --  16  ALKPHOS  --  28*  BILITOT  --  0.4  PROT  --  5.8*  ALBUMIN 3.2* 3.2*   No  results for input(s): LIPASE, AMYLASE in the last 168 hours. No results for input(s): AMMONIA in the last 168 hours. Coagulation profile Recent Labs  Lab 04/06/19 1040  INR 1.1    CBC: Recent Labs  Lab 04/06/19 0851 04/06/19 1040 04/06/19 2243 04/07/19 0337  WBC 6.0 6.0  --  7.5  NEUTROABS 3.8 3.8  --   --   HGB 5.4* 5.4* 7.9* 7.6*  HCT 17.4* 17.8* 23.7* 23.0*  MCV 88.8 89.4  --  84.2  PLT 205 194  --  208   Cardiac Enzymes: No results for input(s): CKTOTAL, CKMB, CKMBINDEX, TROPONINI in the last 168 hours. BNP: Invalid input(s): POCBNP CBG: No results for input(s): GLUCAP in the last 168 hours. D-Dimer No results for input(s): DDIMER in the last 72 hours. Hgb A1c No results for input(s): HGBA1C in the last 72  hours. Lipid Profile No results for input(s): CHOL, HDL, LDLCALC, TRIG, CHOLHDL, LDLDIRECT in the last 72 hours. Thyroid function studies No results for input(s): TSH, T4TOTAL, T3FREE, THYROIDAB in the last 72 hours.  Invalid input(s): FREET3 Anemia work up Recent Labs    04/06/19 0851  FERRITIN 100  TIBC 232*  IRON 51   Microbiology Recent Results (from the past 240 hour(s))  SARS Coronavirus 2 (CEPHEID - Performed in [facility] hospital lab), Hosp Order     Status: None   Collection Time: 04/06/19 11:41 AM   Specimen: Nasopharyngeal Swab  Result Value Ref Range Status   SARS Coronavirus 2 NEGATIVE NEGATIVE Final    Comment: (NOTE) If result is NEGATIVE SARS-CoV-2 target nucleic acids are NOT DETECTED. The SARS-CoV-2 RNA is generally detectable in upper and lower  respiratory specimens during the acute phase of infection. The lowest  concentration of SARS-CoV-2 viral copies this assay can detect is 250  copies / mL. A negative result does not preclude SARS-CoV-2 infection  and should not be used as the sole basis for treatment or other  patient management decisions.  A negative result may occur with  improper specimen collection / handling, submission of specimen other  than nasopharyngeal swab, presence of viral mutation(s) within the  areas targeted by this assay, and inadequate number of viral copies  (<250 copies / mL). A negative result must be combined with clinical  observations, patient history, and epidemiological information. If result is POSITIVE SARS-CoV-2 target nucleic acids are DETECTED. The SARS-CoV-2 RNA is generally detectable in upper and lower  respiratory specimens dur ing the acute phase of infection.  Positive  results are indicative of active infection with SARS-CoV-2.  Clinical  correlation with patient history and other diagnostic information is  necessary to determine patient infection status.  Positive results do  not rule out bacterial  infection or co-infection with other viruses. If result is PRESUMPTIVE POSTIVE SARS-CoV-2 nucleic acids MAY BE PRESENT.   A presumptive positive result was obtained on the submitted specimen  and confirmed on repeat testing.  While 2019 novel coronavirus  (SARS-CoV-2) nucleic acids may be present in the submitted sample  additional confirmatory testing may be necessary for epidemiological  and / or clinical management purposes  to differentiate between  SARS-CoV-2 and other Sarbecovirus currently known to infect humans.  If clinically indicated additional testing with an alternate test  methodology [phone]) is advised. The SARS-CoV-2 RNA is generally  detectable in upper and lower respiratory sp ecimens during the acute  phase of infection. The expected result is Negative. Fact Sheet for Patients:  StrictlyIdeas.no Fact Sheet for  Healthcare Providers: BankingDealers.co.za This test is not yet approved or cleared by the [place] and has been authorized for detection and/or diagnosis of SARS-CoV-2 by FDA under an Emergency Use Authorization (EUA).  This EUA will remain in effect (meaning this test can be used) for the duration of the COVID-19 declaration under Section 564(b)(1) of the Act, 21 U.S.C. section 360bbb-3(b)(1), unless the authorization is terminated or revoked sooner. Performed at [facility], [place] [address], [place], [place] [address]      Discharge Instructions:   Discharge Instructions    Discharge instructions   Complete by: As directed    Renal diet   Increase activity slowly   Complete by: As directed      Allergies as of 04/07/2019   No Known Allergies     Medication List    STOP taking these medications   isosorbide mononitrate 30 MG 24 hr tablet Commonly known as: IMDUR     TAKE these medications   amLODipine 10 MG tablet Commonly known as: NORVASC Take 1 tablet (10 mg total) by  mouth daily.   aspirin 81 MG tablet Take 81 mg by mouth at bedtime.   BiDil 20-37.5 MG tablet Generic drug: isosorbide-hydrALAZINE TAKE TWO TABLETS BY MOUTH THREE TIMES A DAY What changed: See the new instructions.   calcitRIOL 0.25 MCG capsule Commonly known as: ROCALTROL Take 0.25 mcg by mouth daily.   Calcium 600+D 600-800 MG-UNIT Tabs Generic drug: Calcium Carb-Cholecalciferol Take 1 tablet by mouth 2 (two) times a day.   carvedilol 12.5 MG tablet Commonly known as: COREG Take 12.5 mg by mouth 2 (two) times daily.   Centrum Silver 50+Men Tabs Take 1 tablet by mouth daily.   cloNIDine 0.1 MG tablet Commonly known as: CATAPRES Take 0.1 mg by mouth 2 (two) times daily.   furosemide 40 MG tablet Commonly known as: LASIX Take 1.5 tablets (60 mg total) by mouth 2 (two) times daily.   Iron 325 (65 Fe) MG Tabs TAKE 1 TABLET BY MOUTH TWICE DAILY What changed: when to take this   Neo-Synephrine Cold & Sinus 1 % nasal spray Generic drug: phenylephrine Place 1 drop into both nostrils every 6 (six) hours as needed for congestion.   OMEGA 3 PO Take 1 capsule by mouth daily.   potassium chloride SA 20 MEQ tablet Commonly known as: K-DUR Take 1 tablet (20 mEq total) by mouth daily.   rosuvastatin 10 MG tablet Commonly known as: Crestor Take 1 tablet (10 mg total) by mouth daily.   tamsulosin 0.4 MG Caps capsule Commonly known as: FLOMAX Take 1 capsule (0.4 mg total) by mouth daily. What changed: when to take this      Follow-up Information    Michael Blocker, MD Follow up in 1 week(s).   Specialty: Internal Medicine Why: cbc Contact information: [address] [place] [address] [phone]            Time coordinating discharge: 25 min  Signed:  Geradine Girt DO  [facility] 04/07/2019, 9:19 AM

## 2019-04-07 NOTE — Progress Notes (Signed)
On call physician notified of AM hemoglobin via text page.

## 2019-04-07 NOTE — Progress Notes (Signed)
Michael Blackwell to be D/C'd home per MD order. Discussed with the patient and all questions fully answered. VVS, Skin clean, dry and intact without evidence of skin break down, no evidence of skin tears noted.  IV catheter discontinued intact. Site without signs and symptoms of complications. Dressing and pressure applied.  An After Visit Summary was printed and given to the patient. Patient ambulated before discharge. Patient escorted via [place], and D/C home via private auto.  Melonie [place]  04/07/2019 10:16 AM

## 2019-04-20 ENCOUNTER — Ambulatory Visit (HOSPITAL_COMMUNITY): Payer: Medicare Other

## 2019-04-21 ENCOUNTER — Other Ambulatory Visit: Payer: Self-pay

## 2019-04-21 DIAGNOSIS — N186 End stage renal disease: Secondary | ICD-10-CM

## 2019-04-23 ENCOUNTER — Telehealth (HOSPITAL_COMMUNITY): Payer: Self-pay

## 2019-04-23 NOTE — Telephone Encounter (Signed)
The above patient or their representative was contacted and gave the following answers to these questions:         Do you have any of the following symptoms? No  Fever                    Cough                   Shortness of breath  Do  you have any of the following other symptoms?    muscle pain         vomiting,        diarrhea        rash         weakness        red eye        abdominal pain         bruising          bruising or bleeding              joint pain           severe headache    Have you been in contact with someone who was or has been sick in the past 2 weeks? No  Yes                 Unsure                         Unable to assess   Does the person that you were in contact with have any of the following symptoms?   Cough         shortness of breath           muscle pain         vomiting,            diarrhea            rash            weakness           fever            red eye           abdominal pain           bruising  or  bleeding                joint pain                severe headache               Have you  or someone you have been in contact with traveled internationally in th last month?  No        If yes, which countries?   Have you  or someone you have been in contact with traveled outside [place] in th last month? No         If yes, which state and city?   COMMENTS OR ACTION PLAN FOR THIS PATIENT:          

## 2019-04-24 ENCOUNTER — Ambulatory Visit (HOSPITAL_COMMUNITY)
Admission: RE | Admit: 2019-04-24 | Discharge: 2019-04-24 | Disposition: A | Discharge status: Home / Self Care | Payer: Medicare Other | Source: Ambulatory Visit | Attending: Family | Admitting: Family

## 2019-04-24 ENCOUNTER — Ambulatory Visit (INDEPENDENT_AMBULATORY_CARE_PROVIDER_SITE_OTHER): Payer: Self-pay | Admitting: Family

## 2019-04-24 ENCOUNTER — Encounter: Payer: Self-pay | Admitting: *Deleted

## 2019-04-24 ENCOUNTER — Other Ambulatory Visit: Payer: Self-pay

## 2019-04-24 ENCOUNTER — Encounter: Payer: Self-pay | Admitting: Family

## 2019-04-24 VITALS — BP 121/58 | HR 65 | Temp 98.1°F | Resp 12 | Ht 68.0 in | Wt 187.0 lb

## 2019-04-24 DIAGNOSIS — N186 End stage renal disease: Secondary | ICD-10-CM

## 2019-04-24 DIAGNOSIS — I770 Arteriovenous fistula, acquired: Secondary | ICD-10-CM

## 2019-04-24 NOTE — Progress Notes (Signed)
CC: AVF duplex s/p 1st stage creation on 03-04-19  History of Present Illness  Michael Blackwell is a 78 y.o. (04-Dec-1940) male who is s/p right arm first stage basilic vein fistula creation on 03-04-19 by Dr. Donzetta Matters.   He has not yet started hemodialysis. Pt states his nephrologist has not indicated when he may need to start.  He is left hand dominant.   His GFR was <9 on 04-06-19, ESRD.   He does not have any steal type symptoms in his right hand or forearm.    Past Medical History:  Diagnosis Date  . Acute respiratory failure with hypoxia ([place]) 12/05/2016   [name] 12/05/2016  . Anemia   . Anxiety   . Arthritis    "joints ache at night sometimes" (3/8/20180  . Chronic kidney disease (CKD), stage II (mild)    Acute on chronic kidney disease stage II-III/notes 12/06/2016  . Dyspnea    with much activity  . Heart murmur   . History of radiation therapy 02/25/14- 04/23/14   prostate 7800 cGy 40 sessions, seminal vesicles 5600 cGy 40 sessions  . Hypercholesteremia   . Hypertension   . OSA on CPAP   . Pneumonia 12/06/2016  . Prostate cancer ([place]) 11/19/13   gleason 4+3=7, volume 34.74 cc  . Spermatocele    right  . Type II diabetes mellitus (HCC)    diet controlled    Social History Social History   Tobacco Use  . Smoking status: Former Smoker    Years: 48.00    Types: Cigarettes    Quit date: 07/01/2018    Years since quitting: 0.8  . Smokeless tobacco: Never Used  . Tobacco comment: "I smoke 1 cig a month"- 03/03/2019  Substance Use Topics  . Alcohol use: Not Currently  . Drug use: No    Family History Family History  Problem Relation Age of Onset  . Cancer Brother        brain  . Cancer Mother        stomach  . Cancer Father        ? prostate    Surgical History Past Surgical History:  Procedure Laterality Date  . AV FISTULA PLACEMENT Right 03/04/2019   Procedure: RIGHT ARM ARTERIOVENOUS  FISTULA CREATION;  Surgeon: [name], MD;  Location: [facility];  Service: Vascular;  Laterality: Right;  . COLONOSCOPY    . LOWER EXTREMITY ANGIOGRAPHY Bilateral 02/25/2018   Procedure: Lower Extremity Angiography;  Surgeon: [name], MD;  Location: [facility];  Service: Cardiovascular;  Laterality: Bilateral;  limited  . PROSTATE BIOPSY  11/19/13   Gleason 4+3=7, vol 34.74 cc  . RENAL ANGIOGRAPHY N/A 02/25/2018   Procedure: RENAL ANGIOGRAPHY;  Surgeon: [name], MD;  Location: [facility];  Service: Cardiovascular;  Laterality: N/A;  . TONSILLECTOMY     as child    No Known Allergies  Current Outpatient Medications  Medication Sig Dispense Refill  . amLODipine (NORVASC) 10 MG tablet Take 1 tablet (10 mg total) by mouth daily. 30 tablet 0  . aspirin 81 MG tablet Take 81 mg by mouth at bedtime.     Marland Kitchen BIDIL 20-37.5 MG tablet TAKE TWO TABLETS BY MOUTH THREE TIMES A DAY  (Patient taking differently: Take 2 tablets by mouth 3 (three) times daily. ) 540 tablet 0  . calcitRIOL (ROCALTROL) 0.25 MCG capsule Take 0.25 mcg by mouth daily.    . Calcium Carb-Cholecalciferol (CALCIUM 600+D)  600-800 MG-UNIT TABS Take 1 tablet by mouth 2 (two) times a day.    . carvedilol (COREG) 12.5 MG tablet Take 12.5 mg by mouth 2 (two) times daily.    . cloNIDine (CATAPRES) 0.1 MG tablet Take 0.1 mg by mouth 2 (two) times daily.     . Ferrous Sulfate (IRON) 325 (65 Fe) MG TABS TAKE 1 TABLET BY MOUTH TWICE DAILY (Patient taking differently: Take 325 mg by mouth daily. ) 180 each 0  . furosemide (LASIX) 40 MG tablet Take 1.5 tablets (60 mg total) by mouth 2 (two) times daily. 120 tablet 0  . Multiple Vitamins-Minerals (CENTRUM SILVER 50+MEN) TABS Take 1 tablet by mouth daily.    . Omega-3 Fatty Acids (OMEGA 3 PO) Take 1 capsule by mouth daily.    . phenylephrine (NEO-SYNEPHRINE COLD & SINUS) 1 % nasal spray Place 1 drop into both nostrils every 6 (six) hours as needed for congestion.    . potassium chloride SA (K-DUR,KLOR-CON) 20 MEQ tablet  Take 1 tablet (20 mEq total) by mouth daily. 30 tablet 0  . rosuvastatin (CRESTOR) 10 MG tablet Take 1 tablet (10 mg total) by mouth daily. 30 tablet 0  . tamsulosin (FLOMAX) 0.4 MG CAPS capsule Take 1 capsule (0.4 mg total) by mouth daily. (Patient taking differently: Take 0.4 mg by mouth at bedtime. ) 30 capsule 3   No current facility-administered medications for this visit.      REVIEW OF SYSTEMS: see HPI for pertinent positives and negatives    PHYSICAL EXAMINATION:  Vitals:   04/24/19 1443  BP: (!) 121/58  Pulse: 65  Resp: 12  Temp: 98.1 F (36.7 C)  TempSrc: Temporal  SpO2: 98%  Weight: 187 lb (84.8 kg)  Height: 5\' 8"  (1.727 m)   Body mass index is 28.43 kg/m.  General: The patient appears his stated age.   HEENT:  No gross abnormalities Pulmonary: Respirations are non-labored Abdomen: Soft and non-tender  Musculoskeletal: There are no major deformities.   Neurologic: No focal weakness or paresthesias are detected, hand grip strength is 5/5 bilaterally Skin: There are no ulcer or rashes noted. Psychiatric: The patient has normal affect. Cardiovascular: There is a regular rate and rhythm  Right upper arm AVF with palpable pulse more proximally, thrill distally. Good bruit auscultated.  Right radial pulse is 1+ palpable.    Non-Invasive Vascular Imaging  Right arm Access Duplex  (Date: 04/24/2019):  Findings: +--------------------+----------+-----------------+--------+ AVF                 PSV (cm/s)Flow Vol (mL/min)Comments +--------------------+----------+-----------------+--------+ Native artery inflow   221           524                +--------------------+----------+-----------------+--------+ AVF Anastomosis        492                              +--------------------+----------+-----------------+--------+   +------------+----------+-------------+----------+--------+ OUTFLOW VEINPSV (cm/s)Diameter (cm)Depth (cm)Describe  +------------+----------+-------------+----------+--------+ Prox UA        289        0.60        0.83            +------------+----------+-------------+----------+--------+ Mid UA         129        0.64        0.46            +------------+----------+-------------+----------+--------+  Dist UA        125        0.64        0.38            +------------+----------+-------------+----------+--------+ AC Fossa       185        0.67        0.38            +------------+----------+-------------+----------+--------+ Summary: Patent arteriovenous fistula.   Medical Decision Making  Michael Blackwell is a 78 y.o. male who is s/p right arm first stage basilic vein fistula creation on 03-04-19 by Dr. Donzetta Matters.   Dr. Donzetta Matters reviewed AVF duplex results from today, then spoke with and examined pt.  Will schedule 2nd stage right upper arm superficialization of the AVF by Dr. Donzetta Matters.    Clemon Chambers, RN, MSN, FNP-C [facility] Office: [phone]  04/24/2019, 2:48 PM  Clinic MD: Donzetta Matters

## 2019-04-28 ENCOUNTER — Other Ambulatory Visit: Payer: Self-pay | Admitting: Cardiology

## 2019-05-04 ENCOUNTER — Ambulatory Visit (HOSPITAL_COMMUNITY)
Admission: RE | Admit: 2019-05-04 | Discharge: 2019-05-04 | Disposition: A | Discharge status: Home / Self Care | Payer: Medicare Other | Source: Ambulatory Visit | Attending: Nephrology | Admitting: Nephrology

## 2019-05-04 ENCOUNTER — Other Ambulatory Visit: Payer: Self-pay

## 2019-05-04 VITALS — BP 125/48 | HR 56 | Temp 96.6°F | Resp 20

## 2019-05-04 DIAGNOSIS — D631 Anemia in chronic kidney disease: Secondary | ICD-10-CM | POA: Insufficient documentation

## 2019-05-04 DIAGNOSIS — N184 Chronic kidney disease, stage 4 (severe): Secondary | ICD-10-CM | POA: Diagnosis not present

## 2019-05-04 DIAGNOSIS — D508 Other iron deficiency anemias: Secondary | ICD-10-CM

## 2019-05-04 DIAGNOSIS — I701 Atherosclerosis of renal artery: Secondary | ICD-10-CM | POA: Diagnosis not present

## 2019-05-04 LAB — POCT HEMOGLOBIN-HEMACUE: Hemoglobin: 7 g/dL — ABNORMAL LOW (ref 13.0–17.0)

## 2019-05-04 MED ORDER — DARBEPOETIN ALFA 100 MCG/0.5ML IJ SOSY
100.0000 ug | PREFILLED_SYRINGE | INTRAMUSCULAR | Status: DC
  Administered 2019-05-04: 09:00:00 100 ug via SUBCUTANEOUS

## 2019-05-04 MED ORDER — DARBEPOETIN ALFA 100 MCG/0.5ML IJ SOSY
PREFILLED_SYRINGE | INTRAMUSCULAR | Status: AC
  Filled 2019-05-04: qty 0.5

## 2019-05-04 NOTE — Progress Notes (Signed)
Hgb 7.0 (up from 5.4 on 04/06/19). Called to [employer] at [employer]. Pt denies symptoms.

## 2019-05-06 ENCOUNTER — Other Ambulatory Visit: Payer: Self-pay | Admitting: *Deleted

## 2019-05-19 ENCOUNTER — Ambulatory Visit (HOSPITAL_COMMUNITY)
Admission: RE | Admit: 2019-05-19 | Discharge: 2019-05-19 | Disposition: A | Discharge status: Home / Self Care | Payer: Medicare Other | Source: Ambulatory Visit | Attending: Nephrology | Admitting: Nephrology

## 2019-05-19 ENCOUNTER — Other Ambulatory Visit: Payer: Self-pay

## 2019-05-19 VITALS — BP 110/47 | HR 58 | Resp 18

## 2019-05-19 DIAGNOSIS — D631 Anemia in chronic kidney disease: Secondary | ICD-10-CM | POA: Insufficient documentation

## 2019-05-19 DIAGNOSIS — I701 Atherosclerosis of renal artery: Secondary | ICD-10-CM

## 2019-05-19 DIAGNOSIS — D508 Other iron deficiency anemias: Secondary | ICD-10-CM

## 2019-05-19 DIAGNOSIS — N183 Chronic kidney disease, stage 3 (moderate): Secondary | ICD-10-CM | POA: Insufficient documentation

## 2019-05-19 LAB — CBC WITH DIFFERENTIAL/PLATELET
Abs Immature Granulocytes: 0.01 10*3/uL (ref 0.00–0.07)
Basophils Absolute: 0 10*3/uL (ref 0.0–0.1)
Basophils Relative: 1 %
Eosinophils Absolute: 0.1 10*3/uL (ref 0.0–0.5)
Eosinophils Relative: 1 %
HCT: 23.7 % — ABNORMAL LOW (ref 39.0–52.0)
Hemoglobin: 7.3 g/dL — ABNORMAL LOW (ref 13.0–17.0)
Immature Granulocytes: 0 %
Lymphocytes Relative: 26 %
Lymphs Abs: 1.3 10*3/uL (ref 0.7–4.0)
MCH: 28 pg (ref 26.0–34.0)
MCHC: 30.8 g/dL (ref 30.0–36.0)
MCV: 90.8 fL (ref 80.0–100.0)
Monocytes Absolute: 0.3 10*3/uL (ref 0.1–1.0)
Monocytes Relative: 7 %
Neutro Abs: 3.1 10*3/uL (ref 1.7–7.7)
Neutrophils Relative %: 65 %
Platelets: 182 10*3/uL (ref 150–400)
RBC: 2.61 MIL/uL — ABNORMAL LOW (ref 4.22–5.81)
RDW: 16 % — ABNORMAL HIGH (ref 11.5–15.5)
WBC: 4.7 10*3/uL (ref 4.0–10.5)
nRBC: 0 % (ref 0.0–0.2)

## 2019-05-19 LAB — POCT HEMOGLOBIN-HEMACUE: Hemoglobin: 7.3 g/dL — ABNORMAL LOW (ref 13.0–17.0)

## 2019-05-19 MED ORDER — DARBEPOETIN ALFA 100 MCG/0.5ML IJ SOSY
100.0000 ug | PREFILLED_SYRINGE | INTRAMUSCULAR | Status: DC
  Administered 2019-05-19: 09:00:00 100 ug via SUBCUTANEOUS

## 2019-05-19 MED ORDER — DARBEPOETIN ALFA 100 MCG/0.5ML IJ SOSY
PREFILLED_SYRINGE | INTRAMUSCULAR | Status: AC
  Administered 2019-05-19: 09:00:00 100 ug via SUBCUTANEOUS
  Filled 2019-05-19: qty 0.5

## 2019-05-21 ENCOUNTER — Other Ambulatory Visit: Payer: Self-pay | Admitting: Cardiology

## 2019-05-22 ENCOUNTER — Other Ambulatory Visit (HOSPITAL_COMMUNITY)
Admission: RE | Admit: 2019-05-22 | Discharge: 2019-05-22 | Disposition: A | Discharge status: Home / Self Care | Payer: Medicare Other | Source: Ambulatory Visit | Attending: Vascular Surgery | Admitting: Vascular Surgery

## 2019-05-22 DIAGNOSIS — Z01812 Encounter for preprocedural laboratory examination: Secondary | ICD-10-CM | POA: Insufficient documentation

## 2019-05-22 DIAGNOSIS — Z20828 Contact with and (suspected) exposure to other viral communicable diseases: Secondary | ICD-10-CM | POA: Diagnosis not present

## 2019-05-22 LAB — SARS CORONAVIRUS 2 (TAT 6-24 HRS): SARS Coronavirus 2: NEGATIVE

## 2019-05-25 ENCOUNTER — Other Ambulatory Visit: Payer: Self-pay

## 2019-05-25 ENCOUNTER — Encounter (HOSPITAL_COMMUNITY): Payer: Self-pay | Admitting: *Deleted

## 2019-05-25 NOTE — Anesthesia Preprocedure Evaluation (Addendum)
Anesthesia Evaluation  Patient identified by MRN, date of birth, ID band Patient awake    Reviewed: Allergy & Precautions, NPO status , Patient's Chart, lab work & pertinent test results  Airway Mallampati: III  TM Distance: >3 FB Neck ROM: Full    Dental  (+) Edentulous Upper, Missing   Pulmonary sleep apnea and Continuous Positive Airway Pressure Ventilation , former smoker,    Pulmonary exam normal breath sounds clear to auscultation       Cardiovascular hypertension, Pt. on medications and Pt. on home beta blockers Normal cardiovascular exam Rhythm:Regular Rate:Normal  ECG: NSR, rate 62  Sees cardiologist Einar Gip)   Neuro/Psych Anxiety negative neurological ROS     GI/Hepatic negative GI ROS, Neg liver ROS,   Endo/Other  diabetes  Renal/GU Renal disease     Musculoskeletal negative musculoskeletal ROS (+)   Abdominal   Peds  Hematology  (+) anemia , HLD   Anesthesia Other Findings chronic kidney disease stage four  Reproductive/Obstetrics                           Anesthesia Physical Anesthesia Plan  ASA: III  Anesthesia Plan: MAC   Post-op Pain Management:    Induction: Intravenous  PONV Risk Score and Plan: 1 and Ondansetron, Dexamethasone, Propofol infusion, Treatment may vary due to age or medical condition and Midazolam  Airway Management Planned: Simple Face Mask  Additional Equipment:   Intra-op Plan:   Post-operative Plan:   Informed Consent: I have reviewed the patients History and Physical, chart, labs and discussed the procedure including the risks, benefits and alternatives for the proposed anesthesia with the patient or authorized representative who has indicated his/her understanding and acceptance.     Dental advisory given  Plan Discussed with: CRNA  Anesthesia Plan Comments: (CKD V not yet on HD followed by [facility]. Recently admitted  7/6-04/07/19 for symptomatic anemia with Hgb 5.4. Transfused 2 unit PRBC and Hgb at discharge 7.6. Most recent labs 8/18 showed Hgb 7.3.  PVD followed by Dr. Einar Gip.   TTE 12/06/16 - Left ventricle: The cavity size was normal. Wall thickness was   increased in a pattern of mild LVH. Systolic function was normal.   The estimated ejection fraction was in the range of 50% to 55%.   There is hypokinesis of the inferior myocardium. Doppler   parameters are consistent with abnormal left ventricular   relaxation (grade 1 diastolic dysfunction). Doppler parameters   are consistent with high ventricular filling pressure. - Mitral valve: Calcified annulus. 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 Valve area by pressure half-time: 1.53 cm^2. Valve   area by continuity equation (using LVOT flow): 1.78 cm^2. - Left atrium: The atrium was moderately dilated. - Pericardium, extracardiac: A trivial pericardial effusion was   identified.  Impressions:  - Inferior hypokinesis with overall low normal LV systolic   function; grade 1 diastolic dysfunction; mild LVH; elevated LV   filling pressure; MAC with mild MS; moderate LAE.)      Anesthesia Quick Evaluation

## 2019-05-25 NOTE — Progress Notes (Signed)
Patient denies shortness of breath, fever, cough and chest pain.  PCP - Dr Kevan Ny Cardiologist - Dr Adrian Prows Nephrology-Dr Donato Heinz  Chest x-ray - Denies EKG - 03/04/19 Stress Test - 12/24/16 (See H&P 02/24/18,[place]) ECHO - 02/13/17 Cardiac Cath - Denies   Sleep Study - Yes, unknown location CPAP - Uses CPAP nightly  Fasting Blood Sugar - unknown Checks Blood Sugar _0____ times a day DM - diet controlled, no meds  Aspirin Instructions: Last dose 05/24/19 per patient.  Anesthesia review: Yes, James, PA  STOP now taking any Aspirin (unless otherwise instructed by your surgeon), Aleve, Naproxen, Ibuprofen, Motrin, Advil, Goody's, BC's, all herbal medications, fish oil, and all vitamins.   Coronavirus Screening Have you or your wife experienced the following symptoms:  Cough yes/no: No Fever (>100.23F)  yes/no: No Runny nose yes/no: No Sore throat yes/no: No Difficulty breathing/shortness of breath  yes/no: No  Have you or your wife traveled in the last 14 days and where? yes/no: No

## 2019-05-26 ENCOUNTER — Ambulatory Visit (HOSPITAL_COMMUNITY): Payer: Medicare Other | Admitting: Physician Assistant

## 2019-05-26 ENCOUNTER — Encounter (HOSPITAL_COMMUNITY)
Admission: RE | Disposition: A | Discharge status: Home / Self Care | Payer: Self-pay | Source: Home / Self Care | Attending: Vascular Surgery

## 2019-05-26 ENCOUNTER — Ambulatory Visit (HOSPITAL_COMMUNITY)
Admission: RE | Admit: 2019-05-26 | Discharge: 2019-05-26 | Disposition: A | Discharge status: Home / Self Care | Payer: Medicare Other | Attending: Vascular Surgery | Admitting: Vascular Surgery

## 2019-05-26 ENCOUNTER — Encounter (HOSPITAL_COMMUNITY): Payer: Self-pay

## 2019-05-26 DIAGNOSIS — Z923 Personal history of irradiation: Secondary | ICD-10-CM | POA: Diagnosis not present

## 2019-05-26 DIAGNOSIS — G4733 Obstructive sleep apnea (adult) (pediatric): Secondary | ICD-10-CM | POA: Insufficient documentation

## 2019-05-26 DIAGNOSIS — E1122 Type 2 diabetes mellitus with diabetic chronic kidney disease: Secondary | ICD-10-CM | POA: Diagnosis not present

## 2019-05-26 DIAGNOSIS — Z87891 Personal history of nicotine dependence: Secondary | ICD-10-CM | POA: Insufficient documentation

## 2019-05-26 DIAGNOSIS — D649 Anemia, unspecified: Secondary | ICD-10-CM | POA: Insufficient documentation

## 2019-05-26 DIAGNOSIS — F419 Anxiety disorder, unspecified: Secondary | ICD-10-CM | POA: Insufficient documentation

## 2019-05-26 DIAGNOSIS — E7800 Pure hypercholesterolemia, unspecified: Secondary | ICD-10-CM | POA: Diagnosis not present

## 2019-05-26 DIAGNOSIS — N189 Chronic kidney disease, unspecified: Secondary | ICD-10-CM | POA: Diagnosis not present

## 2019-05-26 DIAGNOSIS — N185 Chronic kidney disease, stage 5: Secondary | ICD-10-CM | POA: Diagnosis not present

## 2019-05-26 DIAGNOSIS — I129 Hypertensive chronic kidney disease with stage 1 through stage 4 chronic kidney disease, or unspecified chronic kidney disease: Secondary | ICD-10-CM | POA: Diagnosis not present

## 2019-05-26 DIAGNOSIS — Z7982 Long term (current) use of aspirin: Secondary | ICD-10-CM | POA: Diagnosis not present

## 2019-05-26 DIAGNOSIS — M1990 Unspecified osteoarthritis, unspecified site: Secondary | ICD-10-CM | POA: Diagnosis not present

## 2019-05-26 DIAGNOSIS — R011 Cardiac murmur, unspecified: Secondary | ICD-10-CM | POA: Diagnosis not present

## 2019-05-26 DIAGNOSIS — Z79899 Other long term (current) drug therapy: Secondary | ICD-10-CM | POA: Diagnosis not present

## 2019-05-26 HISTORY — PX: BASCILIC VEIN TRANSPOSITION: SHX5742

## 2019-05-26 HISTORY — DX: Personal history of other medical treatment: Z92.89

## 2019-05-26 HISTORY — DX: Presence of dental prosthetic device (complete) (partial): Z97.2

## 2019-05-26 LAB — GLUCOSE, CAPILLARY
Glucose-Capillary: 101 mg/dL — ABNORMAL HIGH (ref 70–99)
Glucose-Capillary: 110 mg/dL — ABNORMAL HIGH (ref 70–99)

## 2019-05-26 LAB — POCT I-STAT 4, (NA,K, GLUC, HGB,HCT)
Glucose, Bld: 97 mg/dL (ref 70–99)
HCT: 26 % — ABNORMAL LOW (ref 39.0–52.0)
Hemoglobin: 8.8 g/dL — ABNORMAL LOW (ref 13.0–17.0)
Potassium: 3.9 mmol/L (ref 3.5–5.1)
Sodium: 139 mmol/L (ref 135–145)

## 2019-05-26 SURGERY — TRANSPOSITION, VEIN, BASILIC
Anesthesia: Monitor Anesthesia Care | Site: Arm Upper | Laterality: Right

## 2019-05-26 MED ORDER — DEXAMETHASONE SODIUM PHOSPHATE 10 MG/ML IJ SOLN
INTRAMUSCULAR | Status: AC
  Filled 2019-05-26: qty 1

## 2019-05-26 MED ORDER — PROPOFOL 10 MG/ML IV BOLUS
INTRAVENOUS | Status: AC
  Filled 2019-05-26: qty 20

## 2019-05-26 MED ORDER — SODIUM CHLORIDE 0.9 % IV SOLN
INTRAVENOUS | Status: AC
  Filled 2019-05-26: qty 1.2

## 2019-05-26 MED ORDER — FENTANYL CITRATE (PF) 100 MCG/2ML IJ SOLN
INTRAMUSCULAR | Status: DC | PRN
  Administered 2019-05-26 (×2): 25 ug via INTRAVENOUS

## 2019-05-26 MED ORDER — LIDOCAINE-EPINEPHRINE (PF) 1 %-1:200000 IJ SOLN
INTRAMUSCULAR | Status: AC
  Filled 2019-05-26: qty 30

## 2019-05-26 MED ORDER — CEFAZOLIN SODIUM-DEXTROSE 2-4 GM/100ML-% IV SOLN
2.0000 g | INTRAVENOUS | Status: AC
  Administered 2019-05-26: 08:00:00 2 g via INTRAVENOUS
  Filled 2019-05-26: qty 100

## 2019-05-26 MED ORDER — ONDANSETRON HCL 4 MG/2ML IJ SOLN
INTRAMUSCULAR | Status: AC
  Filled 2019-05-26: qty 2

## 2019-05-26 MED ORDER — PROPOFOL 500 MG/50ML IV EMUL
INTRAVENOUS | Status: DC | PRN
  Administered 2019-05-26: 100 ug/kg/min via INTRAVENOUS

## 2019-05-26 MED ORDER — LIDOCAINE 2% (20 MG/ML) 5 ML SYRINGE
INTRAMUSCULAR | Status: AC
  Filled 2019-05-26: qty 5

## 2019-05-26 MED ORDER — OXYCODONE-ACETAMINOPHEN 5-325 MG PO TABS: 1.0000 | ORAL_TABLET | Freq: Four times a day (QID) | ORAL | 0 refills | Status: DC | PRN

## 2019-05-26 MED ORDER — DEXAMETHASONE SODIUM PHOSPHATE 10 MG/ML IJ SOLN
INTRAMUSCULAR | Status: DC | PRN
  Administered 2019-05-26: 4 mg via INTRAVENOUS

## 2019-05-26 MED ORDER — PROPOFOL 10 MG/ML IV BOLUS
INTRAVENOUS | Status: DC | PRN
  Administered 2019-05-26 (×2): 20 mg via INTRAVENOUS

## 2019-05-26 MED ORDER — SODIUM CHLORIDE 0.9 % IV SOLN
INTRAVENOUS | Status: DC | PRN
  Administered 2019-05-26: 08:00:00 500 mL

## 2019-05-26 MED ORDER — 0.9 % SODIUM CHLORIDE (POUR BTL) OPTIME
TOPICAL | Status: DC | PRN
  Administered 2019-05-26: 08:00:00 1000 mL

## 2019-05-26 MED ORDER — FENTANYL CITRATE (PF) 250 MCG/5ML IJ SOLN
INTRAMUSCULAR | Status: AC
  Filled 2019-05-26: qty 5

## 2019-05-26 MED ORDER — LIDOCAINE HCL (PF) 1 % IJ SOLN
INTRAMUSCULAR | Status: AC
  Filled 2019-05-26: qty 30

## 2019-05-26 MED ORDER — ONDANSETRON HCL 4 MG/2ML IJ SOLN
INTRAMUSCULAR | Status: DC | PRN
  Administered 2019-05-26: 4 mg via INTRAVENOUS

## 2019-05-26 MED ORDER — FENTANYL CITRATE (PF) 100 MCG/2ML IJ SOLN: 25.0000 ug | INTRAMUSCULAR | Status: DC | PRN

## 2019-05-26 MED ORDER — LIDOCAINE-EPINEPHRINE (PF) 1 %-1:200000 IJ SOLN
INTRAMUSCULAR | Status: DC | PRN
  Administered 2019-05-26: 30 mL

## 2019-05-26 MED ORDER — SODIUM CHLORIDE 0.9 % IV SOLN
INTRAVENOUS | Status: DC | PRN
  Administered 2019-05-26: 07:00:00 via INTRAVENOUS

## 2019-05-26 MED ORDER — ACETAMINOPHEN 500 MG PO TABS
1000.0000 mg | ORAL_TABLET | Freq: Once | ORAL | Status: AC
  Administered 2019-05-26: 1000 mg via ORAL
  Filled 2019-05-26: qty 2

## 2019-05-26 MED ORDER — SODIUM CHLORIDE 0.9 % IV SOLN: INTRAVENOUS | Status: DC

## 2019-05-26 MED ORDER — ONDANSETRON HCL 4 MG/2ML IJ SOLN: 4.0000 mg | Freq: Once | INTRAMUSCULAR | Status: DC | PRN

## 2019-05-26 SURGICAL SUPPLY — 38 items
ADH SKN CLS APL DERMABOND .7 (GAUZE/BANDAGES/DRESSINGS) ×1
ARMBAND PINK RESTRICT EXTREMIT (MISCELLANEOUS) ×3 IMPLANT
CANISTER SUCT 3000ML PPV (MISCELLANEOUS) ×3 IMPLANT
CLIP VESOCCLUDE MED 24/CT (CLIP) ×3 IMPLANT
CLIP VESOCCLUDE MED 6/CT (CLIP) IMPLANT
CLIP VESOCCLUDE SM WIDE 24/CT (CLIP) ×3 IMPLANT
CLIP VESOCCLUDE SM WIDE 6/CT (CLIP) IMPLANT
COVER PROBE W GEL 5X96 (DRAPES) ×3 IMPLANT
COVER WAND RF STERILE (DRAPES) ×3 IMPLANT
DECANTER SPIKE VIAL GLASS SM (MISCELLANEOUS) ×6 IMPLANT
DERMABOND ADVANCED (GAUZE/BANDAGES/DRESSINGS) ×2
DERMABOND ADVANCED .7 DNX12 (GAUZE/BANDAGES/DRESSINGS) ×1 IMPLANT
ELECT REM PT RETURN 9FT ADLT (ELECTROSURGICAL) ×3
ELECTRODE REM PT RTRN 9FT ADLT (ELECTROSURGICAL) ×1 IMPLANT
GLOVE BIO SURGEON STRL SZ 6.5 (GLOVE) ×4 IMPLANT
GLOVE BIO SURGEON STRL SZ7 (GLOVE) ×3 IMPLANT
GLOVE BIO SURGEON STRL SZ7.5 (GLOVE) ×3 IMPLANT
GLOVE BIO SURGEONS STRL SZ 6.5 (GLOVE) ×2
GLOVE BIOGEL M 6.5 STRL (GLOVE) ×6 IMPLANT
GLOVE BIOGEL M 7.0 STRL (GLOVE) ×6 IMPLANT
GLOVE SURG SS PI 6.5 STRL IVOR (GLOVE) ×3 IMPLANT
GOWN STRL REUS W/ TWL LRG LVL3 (GOWN DISPOSABLE) ×3 IMPLANT
GOWN STRL REUS W/ TWL XL LVL3 (GOWN DISPOSABLE) ×1 IMPLANT
GOWN STRL REUS W/TWL LRG LVL3 (GOWN DISPOSABLE) ×6
GOWN STRL REUS W/TWL XL LVL3 (GOWN DISPOSABLE) ×2
KIT BASIN OR (CUSTOM PROCEDURE TRAY) ×3 IMPLANT
KIT TURNOVER KIT B (KITS) ×3 IMPLANT
NS IRRIG 1000ML POUR BTL (IV SOLUTION) ×3 IMPLANT
PACK CV ACCESS (CUSTOM PROCEDURE TRAY) ×3 IMPLANT
PAD ARMBOARD 7.5X6 YLW CONV (MISCELLANEOUS) ×6 IMPLANT
SUT MNCRL AB 4-0 PS2 18 (SUTURE) ×6 IMPLANT
SUT PROLENE 6 0 BV (SUTURE) ×3 IMPLANT
SUT SILK 2 0 SH (SUTURE) ×3 IMPLANT
SUT VIC AB 3-0 SH 27 (SUTURE) ×9
SUT VIC AB 3-0 SH 27X BRD (SUTURE) ×3 IMPLANT
TOWEL GREEN STERILE (TOWEL DISPOSABLE) ×3 IMPLANT
UNDERPAD 30X30 (UNDERPADS AND DIAPERS) ×3 IMPLANT
WATER STERILE IRR 1000ML POUR (IV SOLUTION) ×3 IMPLANT

## 2019-05-26 NOTE — Transfer of Care (Signed)
Immediate Anesthesia Transfer of Care Note  Patient: Michael Blackwell  Procedure(s) Performed: Right Arm BASILIC VEIN TRANSPOSITION SECOND STAGE (Right Arm Upper)  Patient Location: PACU  Anesthesia Type:MAC  Level of Consciousness: drowsy  Airway & Oxygen Therapy: Patient Spontanous Breathing and Patient connected to face mask oxygen  Post-op Assessment: Report given to RN and Post -op Vital signs reviewed and stable  Post vital signs: Reviewed  Last Vitals:  Vitals Value Taken Time  BP 123/50 05/26/19 0840  Temp    Pulse 56 05/26/19 0842  Resp 20 05/26/19 0842  SpO2 100 % 05/26/19 0842  Vitals shown include unvalidated device data.  Last Pain:  Vitals:   05/26/19 0840  TempSrc:   PainSc: (P) 0-No pain         Complications: No apparent anesthesia complications

## 2019-05-26 NOTE — Anesthesia Postprocedure Evaluation (Signed)
Anesthesia Post Note  Patient: Michael Blackwell  Procedure(s) Performed: Right Arm BASILIC VEIN TRANSPOSITION SECOND STAGE (Right Arm Upper)     Patient location during evaluation: PACU Anesthesia Type: MAC Level of consciousness: awake Pain management: pain level controlled Vital Signs Assessment: post-procedure vital signs reviewed and stable Respiratory status: spontaneous breathing, nonlabored ventilation, respiratory function stable and patient connected to nasal cannula oxygen Cardiovascular status: stable and blood pressure returned to baseline Postop Assessment: no apparent nausea or vomiting Anesthetic complications: no    Last Vitals:  Vitals:   05/26/19 0845 05/26/19 0911  BP:  (!) 132/53  Pulse: (!) 53   Resp: 12   Temp:  36.5 C  SpO2: 100% 100%    Last Pain:  Vitals:   05/26/19 0911  TempSrc:   PainSc: 0-No pain                 Ryan P Ellender

## 2019-05-26 NOTE — Discharge Instructions (Signed)
° °  [facility]  Discharge Instructions  AV Fistula or Graft Surgery for Dialysis Access  Please refer to the following instructions for your post-procedure care. Your surgeon or physician assistant will discuss any changes with you.  Activity  You may drive the day following your surgery, if you are comfortable and no longer taking prescription pain medication. Resume full activity as the soreness in your incision resolves.  Bathing/Showering  You may shower after you go home. Keep your incision dry for 48 hours. Do not soak in a bathtub, hot tub, or swim until the incision heals completely. You may not shower if you have a hemodialysis catheter.  Incision Care  Clean your incision with mild soap and water after 48 hours. Pat the area dry with a clean towel. You do not need a bandage unless otherwise instructed. Do not apply any ointments or creams to your incision. You may have skin glue on your incision. Do not peel it off. It will come off on its own in about one week. Your arm may swell a bit after surgery. To reduce swelling use pillows to elevate your arm so it is above your heart. Your doctor will tell you if you need to lightly wrap your arm with an ACE bandage.  Diet  Resume your normal diet. There are not special food restrictions following this procedure. In order to heal from your surgery, it is CRITICAL to get adequate nutrition. Your body requires vitamins, minerals, and protein. Vegetables are the best source of vitamins and minerals. Vegetables also provide the perfect balance of protein. Processed food has little nutritional value, so try to avoid this.  Medications  Resume taking all of your medications. If your incision is causing pain, you may take over-the counter pain relievers such as acetaminophen (Tylenol). If you were prescribed a stronger pain medication, please be aware these medications can cause nausea and constipation. Prevent  nausea by taking the medication with a snack or meal. Avoid constipation by drinking plenty of fluids and eating foods with high amount of fiber, such as fruits, vegetables, and grains.  Do not take Tylenol if you are taking prescription pain medications.  Follow up Your surgeon may want to see you in the office following your access surgery. If so, this will be arranged at the time of your surgery.  Please call us immediately for any of the following conditions:  Increased pain, redness, drainage (pus) from your incision site Fever of 101 degrees or higher Severe or worsening pain at your incision site Hand pain or numbness.  Reduce your risk of vascular disease:  Stop smoking. If you would like help, call [facility] at 1-800-QUIT-NOW 781-414-9830) or [place] at [place] your cholesterol Maintain a desired weight Control your diabetes Keep your blood pressure down  Dialysis  It will take several weeks to several months for your new dialysis access to be ready for use. Your surgeon will determine when it is okay to use it. Your nephrologist will continue to direct your dialysis. You can continue to use your Permcath until your new access is ready for use.   05/26/2019 Michael Blackwell [id number] 06-Mar-1941  Surgeon(s): [name], MD  Procedure(s): Right Arm BASILIC VEIN TRANSPOSITION SECOND STAGE  x Do not stick fistula until 06/11/2019    If you have any questions, please call the office at [phone].

## 2019-05-26 NOTE — Op Note (Signed)
    Patient name: Michael Blackwell MRN: [id number] DOB: 04-06-1941 Sex: male  05/26/2019 Pre-operative Diagnosis: ckd Post-operative diagnosis:  Same Surgeon:  Erlene Quan C. Donzetta Matters, MD Assistant: Leontine Locket, PA Procedure Performed: Right arm second stage basilic vein arteriovenous fistula creation  Indications: 78 year old male history of chronic kidney disease.  He has undergone a right for stage basilic vein fistula which has matured nicely is now indicated for the second stage.  Findings: There is a large basilic vein which was mature.  Venous to venous anastomosis was created near the antecubitum at completion there was a strong thrill in the fistula and a palpable radial pulse at the wrist.   Procedure:  The patient was identified in the holding area and taken to the operating was placed supine operative MAC anesthesia was induced and he was sterilely prepped draped in the right upper extremity usual fashion antibiotics were administered and a timeout was called.  Incision was made the previous incision we dissected down to the level of the fistula back to the level of the artery.  There was strong thrill in the fistula.  To skip incisions were made.  Nerve was protected throughout the course.  Branches were taken for pain clips and ties.  Vein was marked for orientation.  Was transected near the antecubitum.  I did spatulated both ends removed 1 valve.  I tunneled the vein laterally flushed with heparinized saline and sewed end-to-end with 6-0 Prolene suture.  Prior completion anastomosis we allowed flushing all directions.  Upon completion there was a very strong thrill in the fistula as well as palpable radial pulse the wrist both confirmed with Doppler.  Satisfied we irrigated the wounds closed in layers Vicryl Monocryl.  Dermabond placed to level skin.  He was awakened anesthesia having tolerated procedure without immediate complication.  All counts were correct at completion.  EBL: 25 cc    Brandon C. Donzetta Matters, MD [facility] Office: [phone] Pager: [phone]

## 2019-05-26 NOTE — Anesthesia Procedure Notes (Signed)
Procedure Name: MAC Date/Time: 05/26/2019 7:30 AM Performed by: Janene Harvey, CRNA Pre-anesthesia Checklist: Patient identified, Emergency Drugs available, Suction available, Patient being monitored and Timeout performed Patient Re-evaluated:Patient Re-evaluated prior to induction Oxygen Delivery Method: Simple face mask Dental Injury: Teeth and Oropharynx as per pre-operative assessment

## 2019-05-26 NOTE — H&P (Signed)
History of Present Illness  Michael Blackwell is a 78 y.o. (1941-04-01) male who is s/p right arm first stage basilic vein fistula creation on 03-04-19 by Dr. Donzetta Matters.   He has not yet started hemodialysis. Pt states his nephrologist has not indicated when he may need to start.  He is left hand dominant.   His GFR was <9 on 04-06-19, ESRD.   He does not have any steal type symptoms in his right hand or forearm.        Past Medical History:  Diagnosis Date  . Acute respiratory failure with hypoxia ([place]) 12/05/2016   Michael Blackwell 12/05/2016  . Anemia   . Anxiety   . Arthritis    "joints ache at night sometimes" (3/8/20180  . Chronic kidney disease (CKD), stage II (mild)    Acute on chronic kidney disease stage II-III/notes 12/06/2016  . Dyspnea    with much activity  . Heart murmur   . History of radiation therapy 02/25/14- 04/23/14   prostate 7800 cGy 40 sessions, seminal vesicles 5600 cGy 40 sessions  . Hypercholesteremia   . Hypertension   . OSA on CPAP   . Pneumonia 12/06/2016  . Prostate cancer ([place]) 11/19/13   gleason 4+3=7, volume 34.74 cc  . Spermatocele    right  . Type II diabetes mellitus (HCC)    diet controlled    Social History Social History        Tobacco Use  . Smoking status: Former Smoker    Years: 48.00    Types: Cigarettes    Quit date: 07/01/2018    Years since quitting: 0.8  . Smokeless tobacco: Never Used  . Tobacco comment: "I smoke 1 cig a month"- 03/03/2019  Substance Use Topics  . Alcohol use: Not Currently  . Drug use: No    Family History      Family History  Problem Relation Age of Onset  . Cancer Brother        brain  . Cancer Mother        stomach  . Cancer Father        ? prostate    Surgical History      Past Surgical History:  Procedure Laterality Date  . AV FISTULA PLACEMENT Right 03/04/2019   Procedure: RIGHT ARM ARTERIOVENOUS  FISTULA CREATION;  Surgeon: [name],  MD;  Location: [facility];  Service: Vascular;  Laterality: Right;  . COLONOSCOPY    . LOWER EXTREMITY ANGIOGRAPHY Bilateral 02/25/2018   Procedure: Lower Extremity Angiography;  Surgeon: [name], MD;  Location: [facility];  Service: Cardiovascular;  Laterality: Bilateral;  limited  . PROSTATE BIOPSY  11/19/13   Gleason 4+3=7, vol 34.74 cc  . RENAL ANGIOGRAPHY N/A 02/25/2018   Procedure: RENAL ANGIOGRAPHY;  Surgeon: [name], MD;  Location: [facility];  Service: Cardiovascular;  Laterality: N/A;  . TONSILLECTOMY     as child    No Known Allergies        Current Outpatient Medications  Medication Sig Dispense Refill  . amLODipine (NORVASC) 10 MG tablet Take 1 tablet (10 mg total) by mouth daily. 30 tablet 0  . aspirin 81 MG tablet Take 81 mg by mouth at bedtime.     Marland Kitchen BIDIL 20-37.5 MG tablet TAKE TWO TABLETS BY MOUTH THREE TIMES A DAY  (Patient taking differently: Take 2 tablets by mouth 3 (three) times daily. ) 540 tablet 0  . calcitRIOL (ROCALTROL) 0.25  MCG capsule Take 0.25 mcg by mouth daily.    . Calcium Carb-Cholecalciferol (CALCIUM 600+D) 600-800 MG-UNIT TABS Take 1 tablet by mouth 2 (two) times a day.    . carvedilol (COREG) 12.5 MG tablet Take 12.5 mg by mouth 2 (two) times daily.    . cloNIDine (CATAPRES) 0.1 MG tablet Take 0.1 mg by mouth 2 (two) times daily.     . Ferrous Sulfate (IRON) 325 (65 Fe) MG TABS TAKE 1 TABLET BY MOUTH TWICE DAILY (Patient taking differently: Take 325 mg by mouth daily. ) 180 each 0  . furosemide (LASIX) 40 MG tablet Take 1.5 tablets (60 mg total) by mouth 2 (two) times daily. 120 tablet 0  . Multiple Vitamins-Minerals (CENTRUM SILVER 50+MEN) TABS Take 1 tablet by mouth daily.    . Omega-3 Fatty Acids (OMEGA 3 PO) Take 1 capsule by mouth daily.    . phenylephrine (NEO-SYNEPHRINE COLD & SINUS) 1 % nasal spray Place 1 drop into both nostrils every 6 (six) hours as needed for congestion.    .  potassium chloride SA (K-DUR,KLOR-CON) 20 MEQ tablet Take 1 tablet (20 mEq total) by mouth daily. 30 tablet 0  . rosuvastatin (CRESTOR) 10 MG tablet Take 1 tablet (10 mg total) by mouth daily. 30 tablet 0  . tamsulosin (FLOMAX) 0.4 MG CAPS capsule Take 1 capsule (0.4 mg total) by mouth daily. (Patient taking differently: Take 0.4 mg by mouth at bedtime. ) 30 capsule 3   No current facility-administered medications for this visit.      REVIEW OF SYSTEMS: see HPI for pertinent positives and negatives    PHYSICAL EXAMINATION: Vitals:   05/26/19 0619  BP: (!) 153/41  Pulse: (!) 52  Resp: 18  Temp: 97.8 F (36.6 C)  SpO2: 100%     General: The patient appears his stated age.   HEENT:  No gross abnormalities Pulmonary: Respirations are non-labored Abdomen: Soft and non-tender  Musculoskeletal: There are no major deformities.   Neurologic: No focal weakness or paresthesias are detected, hand grip strength is 5/5 bilaterally Skin: There are no ulcer or rashes noted. Psychiatric: The patient has normal affect. Cardiovascular: There is a regular rate and rhythm  Right upper arm AVF with palpable pulse more proximally, thrill distally. Good bruit auscultated.  Right radial pulse is 1+ palpable.    Non-Invasive Vascular Imaging  Right arm Access Duplex  (Date: 04/24/2019):  Findings: +--------------------+----------+-----------------+--------+ AVF PSV (cm/s)Flow Vol (mL/min)Comments +--------------------+----------+-----------------+--------+ Native artery inflow 221  524   +--------------------+----------+-----------------+--------+ AVF Anastomosis  492    +--------------------+----------+-----------------+--------+  +------------+----------+-------------+----------+--------+ OUTFLOW VEINPSV (cm/s)Diameter (cm)Depth (cm)Describe  +------------+----------+-------------+----------+--------+ Prox UA  289  0.60  0.83   +------------+----------+-------------+----------+--------+ Mid UA  129  0.64  0.46   +------------+----------+-------------+----------+--------+ Dist UA  125  0.64  0.38   +------------+----------+-------------+----------+--------+ AC Fossa  185  0.67  0.38   +------------+----------+-------------+----------+--------+ Summary: Patent arteriovenous fistula.   A/P  Michael Blackwell is a 78 y.o. male who is s/p right arm first stage basilic vein fistula creation on 03-04-19. Plan for second stage bvt today.  Brandon C. Donzetta Matters, MD [facility] Office: [phone] Pager: [phone]

## 2019-05-27 ENCOUNTER — Other Ambulatory Visit: Payer: Self-pay | Admitting: Cardiology

## 2019-05-27 ENCOUNTER — Encounter (HOSPITAL_COMMUNITY): Payer: Self-pay | Admitting: Vascular Surgery

## 2019-06-01 ENCOUNTER — Encounter (HOSPITAL_COMMUNITY): Payer: Medicare Other

## 2019-06-02 ENCOUNTER — Other Ambulatory Visit: Payer: Self-pay

## 2019-06-02 ENCOUNTER — Ambulatory Visit (HOSPITAL_COMMUNITY)
Admission: RE | Admit: 2019-06-02 | Discharge: 2019-06-02 | Disposition: A | Discharge status: Home / Self Care | Payer: Medicare Other | Source: Ambulatory Visit | Attending: Nephrology | Admitting: Nephrology

## 2019-06-02 VITALS — BP 132/44 | HR 55 | Temp 96.7°F | Resp 20

## 2019-06-02 DIAGNOSIS — D508 Other iron deficiency anemias: Secondary | ICD-10-CM

## 2019-06-02 DIAGNOSIS — D631 Anemia in chronic kidney disease: Secondary | ICD-10-CM | POA: Insufficient documentation

## 2019-06-02 DIAGNOSIS — I701 Atherosclerosis of renal artery: Secondary | ICD-10-CM | POA: Insufficient documentation

## 2019-06-02 DIAGNOSIS — N184 Chronic kidney disease, stage 4 (severe): Secondary | ICD-10-CM | POA: Insufficient documentation

## 2019-06-02 LAB — RENAL FUNCTION PANEL
Albumin: 3.3 g/dL — ABNORMAL LOW (ref 3.5–5.0)
Anion gap: 12 (ref 5–15)
BUN: 73 mg/dL — ABNORMAL HIGH (ref 8–23)
CO2: 22 mmol/L (ref 22–32)
Calcium: 8.9 mg/dL (ref 8.9–10.3)
Chloride: 105 mmol/L (ref 98–111)
Creatinine, Ser: 6.29 mg/dL — ABNORMAL HIGH (ref 0.61–1.24)
GFR calc Af Amer: 9 mL/min — ABNORMAL LOW (ref >60)
GFR calc non Af Amer: 8 mL/min — ABNORMAL LOW (ref >60)
Glucose, Bld: 107 mg/dL — ABNORMAL HIGH (ref 70–99)
Phosphorus: 4.6 mg/dL (ref 2.5–4.6)
Potassium: 4.2 mmol/L (ref 3.5–5.1)
Sodium: 139 mmol/L (ref 135–145)

## 2019-06-02 LAB — IRON AND TIBC
Iron: 34 ug/dL — ABNORMAL LOW (ref 45–182)
Saturation Ratios: 14 % — ABNORMAL LOW (ref 17.9–39.5)
TIBC: 241 ug/dL — ABNORMAL LOW (ref 250–450)
UIBC: 207 ug/dL

## 2019-06-02 LAB — FERRITIN: Ferritin: 29 ng/mL (ref 24–336)

## 2019-06-02 LAB — POCT HEMOGLOBIN-HEMACUE: Hemoglobin: 8.1 g/dL — ABNORMAL LOW (ref 13.0–17.0)

## 2019-06-02 MED ORDER — DARBEPOETIN ALFA 100 MCG/0.5ML IJ SOSY
100.0000 ug | PREFILLED_SYRINGE | INTRAMUSCULAR | Status: DC
  Administered 2019-06-02: 100 ug via SUBCUTANEOUS

## 2019-06-02 MED ORDER — DARBEPOETIN ALFA 100 MCG/0.5ML IJ SOSY
PREFILLED_SYRINGE | INTRAMUSCULAR | Status: AC
  Filled 2019-06-02: qty 0.5

## 2019-06-03 LAB — HEPATITIS B SURFACE ANTIGEN: Hepatitis B Surface Ag: NEGATIVE

## 2019-06-15 ENCOUNTER — Other Ambulatory Visit (HOSPITAL_COMMUNITY): Payer: Self-pay

## 2019-06-15 NOTE — Progress Notes (Signed)
POST OPERATIVE OFFICE NOTE    CC:  F/u for surgery  HPI:  This is a 77 y.o. male who is s/p right 2nd stage BVT by Dr. Donzetta Matters on 05/26/2019.  He is here today for follow up.  He does not have evidence of steal. He states he does not have any soreness.  He is not on dialysis yet and is being evaluated for a transplant.  No Known Allergies  Current Outpatient Medications  Medication Sig Dispense Refill  . amLODipine (NORVASC) 10 MG tablet Take 1 tablet by mouth once daily 30 tablet 0  . aspirin 81 MG tablet Take 81 mg by mouth at bedtime.     Marland Kitchen BIDIL 20-37.5 MG tablet TAKE TWO TABLETS BY MOUTH THREE TIMES A DAY  (Patient taking differently: Take 2 tablets by mouth 3 (three) times daily. ) 540 tablet 0  . bismuth subsalicylate (PEPTO BISMOL) 262 MG/15ML suspension Take 30 mLs by mouth every 6 (six) hours as needed for indigestion.    . calcitRIOL (ROCALTROL) 0.25 MCG capsule Take 0.25 mcg by mouth daily.    . Calcium Carb-Cholecalciferol (CALCIUM 600+D) 600-800 MG-UNIT TABS Take 1 tablet by mouth 2 (two) times a day.    . carvedilol (COREG) 12.5 MG tablet Take 12.5 mg by mouth 2 (two) times daily.    . cholecalciferol (VITAMIN D3) 25 MCG (1000 UT) tablet Take 1,000 Units by mouth daily.    . cloNIDine (CATAPRES) 0.1 MG tablet Take 0.1 mg by mouth 2 (two) times daily.     . famotidine (PEPCID) 20 MG tablet Take 20 mg by mouth daily as needed for heartburn or indigestion.    . Ferrous Sulfate (IRON) 325 (65 Fe) MG TABS Take 1 tablet by mouth twice daily 180 tablet 0  . furosemide (LASIX) 40 MG tablet Take 1.5 tablets (60 mg total) by mouth 2 (two) times daily. 120 tablet 0  . HYDRALAZINE HCL PO Take 100 mg by mouth 3 (three) times daily.    . Multiple Vitamins-Minerals (CENTRUM SILVER 50+MEN) TABS Take 1 tablet by mouth daily.    . Omega-3 Fatty Acids (OMEGA 3 PO) Take 1,040 mg by mouth 2 (two) times daily.     Marland Kitchen oxyCODONE-acetaminophen (PERCOCET) 5-325 MG tablet Take 1 tablet by mouth every 6  (six) hours as needed. 20 tablet 0  . phenylephrine (NEO-SYNEPHRINE COLD & SINUS) 1 % nasal spray Place 1 drop into both nostrils at bedtime as needed for congestion.     . potassium chloride SA (K-DUR,KLOR-CON) 20 MEQ tablet Take 1 tablet (20 mEq total) by mouth daily. 30 tablet 0  . rosuvastatin (CRESTOR) 10 MG tablet Take 1 tablet by mouth daily 90 tablet 0  . tamsulosin (FLOMAX) 0.4 MG CAPS capsule Take 1 capsule (0.4 mg total) by mouth daily. (Patient taking differently: Take 0.4 mg by mouth every other day. ) 30 capsule 3   No current facility-administered medications for this visit.      ROS:  See HPI  Physical Exam:  Today's Vitals   06/19/19 1155  BP: (!) 157/67  Pulse: 70  Resp: 14  Temp: 98 F (36.7 C)  TempSrc: Temporal  SpO2: 99%  Weight: 190 lb (86.2 kg)  Height: 5\' 8"  (1.727 m)   Body mass index is 28.89 kg/m.   Incision:  Incisions have healed nicely.   Extremities:  Easily palpable right radial pulse.  There is an excellent thrill in the fistula and is easily palpable.   Assessment/Plan:  This is a 78 y.o. male who is s/p: right 2nd stage BVT by Dr. Donzetta Matters on 05/26/2019  -pt is doing well without evidence of steal.  His incisions are healing nicely. -he is not yet on dialysis, but the fistula can be accessed should he need dialysis.   -he will f/u as needed.    Leontine Locket, PA-C [facility] [phone]  Clinic MD:  Donzetta Matters

## 2019-06-16 ENCOUNTER — Ambulatory Visit (HOSPITAL_COMMUNITY)
Admission: RE | Admit: 2019-06-16 | Discharge: 2019-06-16 | Disposition: A | Discharge status: Home / Self Care | Payer: Medicare Other | Source: Ambulatory Visit | Attending: Nephrology | Admitting: Nephrology

## 2019-06-16 ENCOUNTER — Other Ambulatory Visit: Payer: Self-pay

## 2019-06-16 VITALS — BP 142/52 | HR 54 | Temp 96.7°F | Resp 20 | Ht 68.0 in | Wt 182.0 lb

## 2019-06-16 DIAGNOSIS — I701 Atherosclerosis of renal artery: Secondary | ICD-10-CM

## 2019-06-16 DIAGNOSIS — N189 Chronic kidney disease, unspecified: Secondary | ICD-10-CM | POA: Insufficient documentation

## 2019-06-16 DIAGNOSIS — D631 Anemia in chronic kidney disease: Secondary | ICD-10-CM | POA: Diagnosis not present

## 2019-06-16 DIAGNOSIS — D508 Other iron deficiency anemias: Secondary | ICD-10-CM

## 2019-06-16 LAB — CBC WITH DIFFERENTIAL/PLATELET
Abs Immature Granulocytes: 0.01 10*3/uL (ref 0.00–0.07)
Basophils Absolute: 0.1 10*3/uL (ref 0.0–0.1)
Basophils Relative: 1 %
Eosinophils Absolute: 0.1 10*3/uL (ref 0.0–0.5)
Eosinophils Relative: 1 %
HCT: 26.5 % — ABNORMAL LOW (ref 39.0–52.0)
Hemoglobin: 8.4 g/dL — ABNORMAL LOW (ref 13.0–17.0)
Immature Granulocytes: 0 %
Lymphocytes Relative: 23 %
Lymphs Abs: 1.1 10*3/uL (ref 0.7–4.0)
MCH: 27.5 pg (ref 26.0–34.0)
MCHC: 31.7 g/dL (ref 30.0–36.0)
MCV: 86.9 fL (ref 80.0–100.0)
Monocytes Absolute: 0.4 10*3/uL (ref 0.1–1.0)
Monocytes Relative: 8 %
Neutro Abs: 3.3 10*3/uL (ref 1.7–7.7)
Neutrophils Relative %: 67 %
Platelets: 207 10*3/uL (ref 150–400)
RBC: 3.05 MIL/uL — ABNORMAL LOW (ref 4.22–5.81)
RDW: 16.2 % — ABNORMAL HIGH (ref 11.5–15.5)
WBC: 4.9 10*3/uL (ref 4.0–10.5)
nRBC: 0 % (ref 0.0–0.2)

## 2019-06-16 LAB — POCT HEMOGLOBIN-HEMACUE: Hemoglobin: 8.5 g/dL — ABNORMAL LOW (ref 13.0–17.0)

## 2019-06-16 MED ORDER — DARBEPOETIN ALFA 100 MCG/0.5ML IJ SOSY
100.0000 ug | PREFILLED_SYRINGE | INTRAMUSCULAR | Status: DC
  Administered 2019-06-16: 09:00:00 100 ug via SUBCUTANEOUS

## 2019-06-16 MED ORDER — DARBEPOETIN ALFA 100 MCG/0.5ML IJ SOSY
PREFILLED_SYRINGE | INTRAMUSCULAR | Status: AC
  Administered 2019-06-16: 09:00:00 100 ug via SUBCUTANEOUS
  Filled 2019-06-16: qty 0.5

## 2019-06-16 MED ORDER — SODIUM CHLORIDE 0.9 % IV SOLN
510.0000 mg | INTRAVENOUS | Status: DC
  Administered 2019-06-16: 510 mg via INTRAVENOUS
  Filled 2019-06-16: qty 17

## 2019-06-16 NOTE — Discharge Instructions (Signed)

## 2019-06-17 LAB — PTH, INTACT AND CALCIUM
Calcium, Total (PTH): 9.1 mg/dL (ref 8.6–10.2)
PTH: 60 pg/mL (ref 15–65)

## 2019-06-19 ENCOUNTER — Other Ambulatory Visit: Payer: Self-pay

## 2019-06-19 ENCOUNTER — Ambulatory Visit (INDEPENDENT_AMBULATORY_CARE_PROVIDER_SITE_OTHER): Payer: Self-pay | Admitting: Physician Assistant

## 2019-06-19 VITALS — BP 157/67 | HR 70 | Temp 98.0°F | Resp 14 | Ht 68.0 in | Wt 190.0 lb

## 2019-06-19 DIAGNOSIS — N184 Chronic kidney disease, stage 4 (severe): Secondary | ICD-10-CM

## 2019-06-23 ENCOUNTER — Encounter (HOSPITAL_COMMUNITY): Payer: Medicare Other

## 2019-06-27 ENCOUNTER — Other Ambulatory Visit: Payer: Self-pay | Admitting: Cardiology

## 2019-06-29 ENCOUNTER — Other Ambulatory Visit: Payer: Self-pay | Admitting: Cardiology

## 2019-06-30 ENCOUNTER — Other Ambulatory Visit: Payer: Self-pay

## 2019-06-30 ENCOUNTER — Encounter (HOSPITAL_COMMUNITY): Payer: Medicare Other

## 2019-06-30 ENCOUNTER — Ambulatory Visit (HOSPITAL_COMMUNITY)
Admission: RE | Admit: 2019-06-30 | Discharge: 2019-06-30 | Disposition: A | Discharge status: Home / Self Care | Payer: Medicare Other | Source: Ambulatory Visit | Attending: Nephrology | Admitting: Nephrology

## 2019-06-30 VITALS — BP 140/55 | HR 57 | Temp 97.3°F | Resp 20 | Ht 68.0 in | Wt 185.0 lb

## 2019-06-30 DIAGNOSIS — I701 Atherosclerosis of renal artery: Secondary | ICD-10-CM

## 2019-06-30 DIAGNOSIS — D631 Anemia in chronic kidney disease: Secondary | ICD-10-CM | POA: Diagnosis not present

## 2019-06-30 DIAGNOSIS — D508 Other iron deficiency anemias: Secondary | ICD-10-CM

## 2019-06-30 DIAGNOSIS — N184 Chronic kidney disease, stage 4 (severe): Secondary | ICD-10-CM | POA: Insufficient documentation

## 2019-06-30 LAB — POCT HEMOGLOBIN-HEMACUE: Hemoglobin: 8.5 g/dL — ABNORMAL LOW (ref 13.0–17.0)

## 2019-06-30 MED ORDER — DARBEPOETIN ALFA 100 MCG/0.5ML IJ SOSY
PREFILLED_SYRINGE | INTRAMUSCULAR | Status: AC
  Filled 2019-06-30: qty 0.5

## 2019-06-30 MED ORDER — SODIUM CHLORIDE 0.9 % IV SOLN
510.0000 mg | INTRAVENOUS | Status: DC
  Administered 2019-06-30: 510 mg via INTRAVENOUS
  Filled 2019-06-30: qty 17

## 2019-06-30 MED ORDER — DARBEPOETIN ALFA 100 MCG/0.5ML IJ SOSY
100.0000 ug | PREFILLED_SYRINGE | INTRAMUSCULAR | Status: DC
  Administered 2019-06-30: 100 ug via SUBCUTANEOUS

## 2019-07-14 ENCOUNTER — Other Ambulatory Visit: Payer: Self-pay

## 2019-07-14 ENCOUNTER — Encounter (HOSPITAL_COMMUNITY)
Admission: RE | Admit: 2019-07-14 | Discharge: 2019-07-14 | Disposition: A | Discharge status: Home / Self Care | Payer: Medicare Other | Source: Ambulatory Visit | Attending: Nephrology | Admitting: Nephrology

## 2019-07-14 VITALS — BP 163/66 | HR 61 | Temp 97.3°F | Resp 18

## 2019-07-14 DIAGNOSIS — D508 Other iron deficiency anemias: Secondary | ICD-10-CM | POA: Insufficient documentation

## 2019-07-14 DIAGNOSIS — I701 Atherosclerosis of renal artery: Secondary | ICD-10-CM | POA: Diagnosis present

## 2019-07-14 LAB — CBC WITH DIFFERENTIAL/PLATELET
Abs Immature Granulocytes: 0.01 10*3/uL (ref 0.00–0.07)
Basophils Absolute: 0.1 10*3/uL (ref 0.0–0.1)
Basophils Relative: 1 %
Eosinophils Absolute: 0.1 10*3/uL (ref 0.0–0.5)
Eosinophils Relative: 2 %
HCT: 30.2 % — ABNORMAL LOW (ref 39.0–52.0)
Hemoglobin: 9.7 g/dL — ABNORMAL LOW (ref 13.0–17.0)
Immature Granulocytes: 0 %
Lymphocytes Relative: 26 %
Lymphs Abs: 1.2 10*3/uL (ref 0.7–4.0)
MCH: 27.6 pg (ref 26.0–34.0)
MCHC: 32.1 g/dL (ref 30.0–36.0)
MCV: 86 fL (ref 80.0–100.0)
Monocytes Absolute: 0.5 10*3/uL (ref 0.1–1.0)
Monocytes Relative: 10 %
Neutro Abs: 2.8 10*3/uL (ref 1.7–7.7)
Neutrophils Relative %: 61 %
Platelets: 161 10*3/uL (ref 150–400)
RBC: 3.51 MIL/uL — ABNORMAL LOW (ref 4.22–5.81)
RDW: 19.5 % — ABNORMAL HIGH (ref 11.5–15.5)
WBC: 4.6 10*3/uL (ref 4.0–10.5)
nRBC: 0 % (ref 0.0–0.2)

## 2019-07-14 LAB — RENAL FUNCTION PANEL
Albumin: 3.2 g/dL — ABNORMAL LOW (ref 3.5–5.0)
Anion gap: 12 (ref 5–15)
BUN: 59 mg/dL — ABNORMAL HIGH (ref 8–23)
CO2: 18 mmol/L — ABNORMAL LOW (ref 22–32)
Calcium: 8.8 mg/dL — ABNORMAL LOW (ref 8.9–10.3)
Chloride: 110 mmol/L (ref 98–111)
Creatinine, Ser: 6.49 mg/dL — ABNORMAL HIGH (ref 0.61–1.24)
GFR calc Af Amer: 9 mL/min — ABNORMAL LOW (ref >60)
GFR calc non Af Amer: 8 mL/min — ABNORMAL LOW (ref >60)
Glucose, Bld: 108 mg/dL — ABNORMAL HIGH (ref 70–99)
Phosphorus: 4.2 mg/dL (ref 2.5–4.6)
Potassium: 4.3 mmol/L (ref 3.5–5.1)
Sodium: 140 mmol/L (ref 135–145)

## 2019-07-14 LAB — POCT HEMOGLOBIN-HEMACUE: Hemoglobin: 10 g/dL — ABNORMAL LOW (ref 13.0–17.0)

## 2019-07-14 MED ORDER — DARBEPOETIN ALFA 100 MCG/0.5ML IJ SOSY
PREFILLED_SYRINGE | INTRAMUSCULAR | Status: AC
  Administered 2019-07-14: 09:00:00 100 ug via SUBCUTANEOUS
  Filled 2019-07-14: qty 0.5

## 2019-07-14 MED ORDER — DARBEPOETIN ALFA 100 MCG/0.5ML IJ SOSY
100.0000 ug | PREFILLED_SYRINGE | INTRAMUSCULAR | Status: DC
  Administered 2019-07-14: 09:00:00 100 ug via SUBCUTANEOUS

## 2019-07-27 ENCOUNTER — Ambulatory Visit (INDEPENDENT_AMBULATORY_CARE_PROVIDER_SITE_OTHER): Payer: Medicare Other

## 2019-07-27 ENCOUNTER — Other Ambulatory Visit: Payer: Self-pay

## 2019-07-27 DIAGNOSIS — R0989 Other specified symptoms and signs involving the circulatory and respiratory systems: Secondary | ICD-10-CM

## 2019-07-28 ENCOUNTER — Encounter (HOSPITAL_COMMUNITY)
Admission: RE | Admit: 2019-07-28 | Discharge: 2019-07-28 | Disposition: A | Discharge status: Home / Self Care | Payer: Medicare Other | Source: Ambulatory Visit | Attending: Nephrology | Admitting: Nephrology

## 2019-07-28 ENCOUNTER — Other Ambulatory Visit: Payer: Self-pay | Admitting: Cardiology

## 2019-07-28 ENCOUNTER — Other Ambulatory Visit: Payer: Self-pay

## 2019-07-28 VITALS — BP 152/57 | HR 56 | Temp 96.9°F | Resp 20

## 2019-07-28 DIAGNOSIS — I701 Atherosclerosis of renal artery: Secondary | ICD-10-CM

## 2019-07-28 DIAGNOSIS — D508 Other iron deficiency anemias: Secondary | ICD-10-CM

## 2019-07-28 DIAGNOSIS — I6523 Occlusion and stenosis of bilateral carotid arteries: Secondary | ICD-10-CM

## 2019-07-28 LAB — IRON AND TIBC
Iron: 45 ug/dL (ref 45–182)
Saturation Ratios: 20 % (ref 17.9–39.5)
TIBC: 224 ug/dL — ABNORMAL LOW (ref 250–450)
UIBC: 179 ug/dL

## 2019-07-28 LAB — FERRITIN: Ferritin: 104 ng/mL (ref 24–336)

## 2019-07-28 LAB — POCT HEMOGLOBIN-HEMACUE: Hemoglobin: 10.1 g/dL — ABNORMAL LOW (ref 13.0–17.0)

## 2019-07-28 MED ORDER — DARBEPOETIN ALFA 100 MCG/0.5ML IJ SOSY
PREFILLED_SYRINGE | INTRAMUSCULAR | Status: AC
  Filled 2019-07-28: qty 0.5

## 2019-07-28 MED ORDER — DARBEPOETIN ALFA 100 MCG/0.5ML IJ SOSY
100.0000 ug | PREFILLED_SYRINGE | INTRAMUSCULAR | Status: DC
  Administered 2019-07-28: 100 ug via SUBCUTANEOUS

## 2019-07-29 DIAGNOSIS — G4733 Obstructive sleep apnea (adult) (pediatric): Secondary | ICD-10-CM | POA: Insufficient documentation

## 2019-07-29 DIAGNOSIS — F17210 Nicotine dependence, cigarettes, uncomplicated: Secondary | ICD-10-CM | POA: Insufficient documentation

## 2019-07-29 DIAGNOSIS — I739 Peripheral vascular disease, unspecified: Secondary | ICD-10-CM | POA: Insufficient documentation

## 2019-08-10 ENCOUNTER — Other Ambulatory Visit (HOSPITAL_COMMUNITY): Payer: Self-pay | Admitting: *Deleted

## 2019-08-11 ENCOUNTER — Ambulatory Visit (HOSPITAL_COMMUNITY)
Admission: RE | Admit: 2019-08-11 | Discharge: 2019-08-11 | Disposition: A | Discharge status: Home / Self Care | Payer: Medicare Other | Source: Ambulatory Visit | Attending: Nephrology | Admitting: Nephrology

## 2019-08-11 ENCOUNTER — Other Ambulatory Visit: Payer: Self-pay

## 2019-08-11 VITALS — BP 169/95 | HR 60 | Temp 97.3°F | Resp 20 | Ht 68.0 in | Wt 186.0 lb

## 2019-08-11 DIAGNOSIS — D631 Anemia in chronic kidney disease: Secondary | ICD-10-CM | POA: Insufficient documentation

## 2019-08-11 DIAGNOSIS — D508 Other iron deficiency anemias: Secondary | ICD-10-CM

## 2019-08-11 DIAGNOSIS — I701 Atherosclerosis of renal artery: Secondary | ICD-10-CM

## 2019-08-11 DIAGNOSIS — N189 Chronic kidney disease, unspecified: Secondary | ICD-10-CM | POA: Insufficient documentation

## 2019-08-11 LAB — RENAL FUNCTION PANEL
Albumin: 3.1 g/dL — ABNORMAL LOW (ref 3.5–5.0)
Anion gap: 10 (ref 5–15)
BUN: 75 mg/dL — ABNORMAL HIGH (ref 8–23)
CO2: 20 mmol/L — ABNORMAL LOW (ref 22–32)
Calcium: 9.4 mg/dL (ref 8.9–10.3)
Chloride: 112 mmol/L — ABNORMAL HIGH (ref 98–111)
Creatinine, Ser: 7.05 mg/dL — ABNORMAL HIGH (ref 0.61–1.24)
GFR calc Af Amer: 8 mL/min — ABNORMAL LOW (ref >60)
GFR calc non Af Amer: 7 mL/min — ABNORMAL LOW (ref >60)
Glucose, Bld: 103 mg/dL — ABNORMAL HIGH (ref 70–99)
Phosphorus: 4.5 mg/dL (ref 2.5–4.6)
Potassium: 4.2 mmol/L (ref 3.5–5.1)
Sodium: 142 mmol/L (ref 135–145)

## 2019-08-11 LAB — CBC WITH DIFFERENTIAL/PLATELET
Abs Immature Granulocytes: 0.01 10*3/uL (ref 0.00–0.07)
Basophils Absolute: 0.1 10*3/uL (ref 0.0–0.1)
Basophils Relative: 1 %
Eosinophils Absolute: 0.1 10*3/uL (ref 0.0–0.5)
Eosinophils Relative: 2 %
HCT: 33.7 % — ABNORMAL LOW (ref 39.0–52.0)
Hemoglobin: 10.6 g/dL — ABNORMAL LOW (ref 13.0–17.0)
Immature Granulocytes: 0 %
Lymphocytes Relative: 21 %
Lymphs Abs: 1.2 10*3/uL (ref 0.7–4.0)
MCH: 26.6 pg (ref 26.0–34.0)
MCHC: 31.5 g/dL (ref 30.0–36.0)
MCV: 84.7 fL (ref 80.0–100.0)
Monocytes Absolute: 0.5 10*3/uL (ref 0.1–1.0)
Monocytes Relative: 9 %
Neutro Abs: 3.7 10*3/uL (ref 1.7–7.7)
Neutrophils Relative %: 67 %
Platelets: 159 10*3/uL (ref 150–400)
RBC: 3.98 MIL/uL — ABNORMAL LOW (ref 4.22–5.81)
RDW: 19.8 % — ABNORMAL HIGH (ref 11.5–15.5)
WBC: 5.4 10*3/uL (ref 4.0–10.5)
nRBC: 0 % (ref 0.0–0.2)

## 2019-08-11 LAB — POCT HEMOGLOBIN-HEMACUE: Hemoglobin: 10.7 g/dL — ABNORMAL LOW (ref 13.0–17.0)

## 2019-08-11 LAB — HEPATITIS B SURFACE ANTIGEN: Hepatitis B Surface Ag: NONREACTIVE

## 2019-08-11 MED ORDER — DARBEPOETIN ALFA 100 MCG/0.5ML IJ SOSY
100.0000 ug | PREFILLED_SYRINGE | INTRAMUSCULAR | Status: DC
  Administered 2019-08-11: 09:00:00 100 ug via SUBCUTANEOUS

## 2019-08-11 MED ORDER — DARBEPOETIN ALFA 100 MCG/0.5ML IJ SOSY
PREFILLED_SYRINGE | INTRAMUSCULAR | Status: AC
  Administered 2019-08-11: 09:00:00 100 ug via SUBCUTANEOUS
  Filled 2019-08-11: qty 0.5

## 2019-08-11 MED ORDER — SODIUM CHLORIDE 0.9 % IV SOLN
510.0000 mg | INTRAVENOUS | Status: DC
  Administered 2019-08-11: 09:00:00 510 mg via INTRAVENOUS
  Filled 2019-08-11: qty 17

## 2019-08-13 NOTE — Progress Notes (Signed)
Tried to call patient he was not home, was told to call back later.

## 2019-08-13 NOTE — Progress Notes (Signed)
Gave patient results he verbalized understanding

## 2019-08-18 ENCOUNTER — Encounter (HOSPITAL_COMMUNITY)
Admission: RE | Admit: 2019-08-18 | Discharge: 2019-08-18 | Disposition: A | Discharge status: Home / Self Care | Payer: Medicare Other | Source: Ambulatory Visit | Attending: Nephrology | Admitting: Nephrology

## 2019-08-18 ENCOUNTER — Other Ambulatory Visit: Payer: Self-pay

## 2019-08-18 DIAGNOSIS — N189 Chronic kidney disease, unspecified: Secondary | ICD-10-CM | POA: Insufficient documentation

## 2019-08-18 DIAGNOSIS — D631 Anemia in chronic kidney disease: Secondary | ICD-10-CM | POA: Diagnosis not present

## 2019-08-18 MED ORDER — SODIUM CHLORIDE 0.9 % IV SOLN
510.0000 mg | INTRAVENOUS | Status: DC
  Administered 2019-08-18: 510 mg via INTRAVENOUS
  Filled 2019-08-18: qty 510

## 2019-08-25 ENCOUNTER — Other Ambulatory Visit: Payer: Self-pay

## 2019-08-25 ENCOUNTER — Other Ambulatory Visit: Payer: Self-pay | Admitting: Cardiology

## 2019-08-25 ENCOUNTER — Ambulatory Visit (HOSPITAL_COMMUNITY)
Admission: RE | Admit: 2019-08-25 | Discharge: 2019-08-25 | Disposition: A | Discharge status: Home / Self Care | Payer: Medicare Other | Source: Ambulatory Visit | Attending: Nephrology | Admitting: Nephrology

## 2019-08-25 VITALS — BP 176/72 | HR 57 | Temp 96.4°F | Resp 20

## 2019-08-25 DIAGNOSIS — D508 Other iron deficiency anemias: Secondary | ICD-10-CM | POA: Diagnosis present

## 2019-08-25 DIAGNOSIS — I701 Atherosclerosis of renal artery: Secondary | ICD-10-CM | POA: Diagnosis not present

## 2019-08-25 LAB — POCT HEMOGLOBIN-HEMACUE: Hemoglobin: 12 g/dL — ABNORMAL LOW (ref 13.0–17.0)

## 2019-08-25 MED ORDER — DARBEPOETIN ALFA 100 MCG/0.5ML IJ SOSY: 100.0000 ug | PREFILLED_SYRINGE | INTRAMUSCULAR | Status: DC

## 2019-08-25 MED ORDER — DARBEPOETIN ALFA 100 MCG/0.5ML IJ SOSY
PREFILLED_SYRINGE | INTRAMUSCULAR | Status: AC
  Filled 2019-08-25: qty 0.5

## 2019-08-26 ENCOUNTER — Other Ambulatory Visit: Payer: Self-pay | Admitting: Cardiology

## 2019-08-31 ENCOUNTER — Other Ambulatory Visit: Payer: Self-pay | Admitting: Cardiology

## 2019-09-08 ENCOUNTER — Other Ambulatory Visit: Payer: Self-pay

## 2019-09-08 ENCOUNTER — Encounter (HOSPITAL_COMMUNITY)
Admission: RE | Admit: 2019-09-08 | Discharge: 2019-09-08 | Disposition: A | Discharge status: Home / Self Care | Payer: Medicare Other | Source: Ambulatory Visit | Attending: Nephrology | Admitting: Nephrology

## 2019-09-08 ENCOUNTER — Other Ambulatory Visit: Payer: Self-pay | Admitting: Cardiology

## 2019-09-08 VITALS — BP 160/63 | HR 57 | Temp 95.7°F | Resp 20

## 2019-09-08 DIAGNOSIS — N184 Chronic kidney disease, stage 4 (severe): Secondary | ICD-10-CM | POA: Insufficient documentation

## 2019-09-08 DIAGNOSIS — D508 Other iron deficiency anemias: Secondary | ICD-10-CM | POA: Insufficient documentation

## 2019-09-08 DIAGNOSIS — D631 Anemia in chronic kidney disease: Secondary | ICD-10-CM | POA: Diagnosis not present

## 2019-09-08 DIAGNOSIS — I701 Atherosclerosis of renal artery: Secondary | ICD-10-CM | POA: Insufficient documentation

## 2019-09-08 LAB — RENAL FUNCTION PANEL
Albumin: 2.8 g/dL — ABNORMAL LOW (ref 3.5–5.0)
Anion gap: 13 (ref 5–15)
BUN: 88 mg/dL — ABNORMAL HIGH (ref 8–23)
CO2: 19 mmol/L — ABNORMAL LOW (ref 22–32)
Calcium: 9.3 mg/dL (ref 8.9–10.3)
Chloride: 107 mmol/L (ref 98–111)
Creatinine, Ser: 7.5 mg/dL — ABNORMAL HIGH (ref 0.61–1.24)
GFR calc Af Amer: 7 mL/min — ABNORMAL LOW (ref >60)
GFR calc non Af Amer: 6 mL/min — ABNORMAL LOW (ref >60)
Glucose, Bld: 103 mg/dL — ABNORMAL HIGH (ref 70–99)
Phosphorus: 5.1 mg/dL — ABNORMAL HIGH (ref 2.5–4.6)
Potassium: 4.3 mmol/L (ref 3.5–5.1)
Sodium: 139 mmol/L (ref 135–145)

## 2019-09-08 LAB — CBC WITH DIFFERENTIAL/PLATELET
Abs Immature Granulocytes: 0.01 10*3/uL (ref 0.00–0.07)
Basophils Absolute: 0 10*3/uL (ref 0.0–0.1)
Basophils Relative: 1 %
Eosinophils Absolute: 0 10*3/uL (ref 0.0–0.5)
Eosinophils Relative: 1 %
HCT: 35.3 % — ABNORMAL LOW (ref 39.0–52.0)
Hemoglobin: 11 g/dL — ABNORMAL LOW (ref 13.0–17.0)
Immature Granulocytes: 0 %
Lymphocytes Relative: 27 %
Lymphs Abs: 1.1 10*3/uL (ref 0.7–4.0)
MCH: 26.9 pg (ref 26.0–34.0)
MCHC: 31.2 g/dL (ref 30.0–36.0)
MCV: 86.3 fL (ref 80.0–100.0)
Monocytes Absolute: 0.4 10*3/uL (ref 0.1–1.0)
Monocytes Relative: 8 %
Neutro Abs: 2.7 10*3/uL (ref 1.7–7.7)
Neutrophils Relative %: 63 %
Platelets: 182 10*3/uL (ref 150–400)
RBC: 4.09 MIL/uL — ABNORMAL LOW (ref 4.22–5.81)
RDW: 18.6 % — ABNORMAL HIGH (ref 11.5–15.5)
WBC: 4.2 10*3/uL (ref 4.0–10.5)
nRBC: 0 % (ref 0.0–0.2)

## 2019-09-08 LAB — POCT HEMOGLOBIN-HEMACUE: Hemoglobin: 11 g/dL — ABNORMAL LOW (ref 13.0–17.0)

## 2019-09-08 MED ORDER — DARBEPOETIN ALFA 100 MCG/0.5ML IJ SOSY
PREFILLED_SYRINGE | INTRAMUSCULAR | Status: AC
  Filled 2019-09-08: qty 0.5

## 2019-09-08 MED ORDER — DARBEPOETIN ALFA 100 MCG/0.5ML IJ SOSY
100.0000 ug | PREFILLED_SYRINGE | INTRAMUSCULAR | Status: DC
  Administered 2019-09-08: 100 ug via SUBCUTANEOUS

## 2019-09-09 LAB — PTH, INTACT AND CALCIUM
Calcium, Total (PTH): 9.2 mg/dL (ref 8.6–10.2)
PTH: 33 pg/mL (ref 15–65)

## 2019-09-22 ENCOUNTER — Other Ambulatory Visit: Payer: Self-pay

## 2019-09-22 ENCOUNTER — Ambulatory Visit (HOSPITAL_COMMUNITY)
Admission: RE | Admit: 2019-09-22 | Discharge: 2019-09-22 | Disposition: A | Discharge status: Home / Self Care | Payer: Medicare Other | Source: Ambulatory Visit | Attending: Nephrology | Admitting: Nephrology

## 2019-09-22 VITALS — BP 136/57 | HR 55 | Temp 96.0°F | Resp 18

## 2019-09-22 DIAGNOSIS — N184 Chronic kidney disease, stage 4 (severe): Secondary | ICD-10-CM | POA: Diagnosis not present

## 2019-09-22 DIAGNOSIS — D631 Anemia in chronic kidney disease: Secondary | ICD-10-CM | POA: Insufficient documentation

## 2019-09-22 DIAGNOSIS — D508 Other iron deficiency anemias: Secondary | ICD-10-CM

## 2019-09-22 DIAGNOSIS — I701 Atherosclerosis of renal artery: Secondary | ICD-10-CM | POA: Insufficient documentation

## 2019-09-22 LAB — POCT HEMOGLOBIN-HEMACUE: Hemoglobin: 11.9 g/dL — ABNORMAL LOW (ref 13.0–17.0)

## 2019-09-22 LAB — IRON AND TIBC
Iron: 59 ug/dL (ref 45–182)
Saturation Ratios: 27 % (ref 17.9–39.5)
TIBC: 217 ug/dL — ABNORMAL LOW (ref 250–450)
UIBC: 158 ug/dL

## 2019-09-22 LAB — FERRITIN: Ferritin: 243 ng/mL (ref 24–336)

## 2019-09-22 MED ORDER — DARBEPOETIN ALFA 100 MCG/0.5ML IJ SOSY
PREFILLED_SYRINGE | INTRAMUSCULAR | Status: AC
  Filled 2019-09-22: qty 0.5

## 2019-09-22 MED ORDER — DARBEPOETIN ALFA 100 MCG/0.5ML IJ SOSY
100.0000 ug | PREFILLED_SYRINGE | INTRAMUSCULAR | Status: DC
  Administered 2019-09-22: 100 ug via SUBCUTANEOUS

## 2019-10-01 ENCOUNTER — Other Ambulatory Visit: Payer: Self-pay | Admitting: Cardiology

## 2019-10-06 ENCOUNTER — Ambulatory Visit (HOSPITAL_COMMUNITY)
Admission: RE | Admit: 2019-10-06 | Discharge: 2019-10-06 | Disposition: A | Discharge status: Home / Self Care | Payer: Medicare Other | Source: Ambulatory Visit | Attending: Nephrology | Admitting: Nephrology

## 2019-10-06 ENCOUNTER — Other Ambulatory Visit: Payer: Self-pay

## 2019-10-06 VITALS — BP 158/59 | HR 55 | Temp 96.3°F | Resp 20

## 2019-10-06 DIAGNOSIS — I701 Atherosclerosis of renal artery: Secondary | ICD-10-CM | POA: Diagnosis not present

## 2019-10-06 DIAGNOSIS — D631 Anemia in chronic kidney disease: Secondary | ICD-10-CM | POA: Insufficient documentation

## 2019-10-06 DIAGNOSIS — N184 Chronic kidney disease, stage 4 (severe): Secondary | ICD-10-CM | POA: Insufficient documentation

## 2019-10-06 DIAGNOSIS — D508 Other iron deficiency anemias: Secondary | ICD-10-CM

## 2019-10-06 LAB — CBC WITH DIFFERENTIAL/PLATELET
Abs Immature Granulocytes: 0.01 10*3/uL (ref 0.00–0.07)
Basophils Absolute: 0 10*3/uL (ref 0.0–0.1)
Basophils Relative: 1 %
Eosinophils Absolute: 0.1 10*3/uL (ref 0.0–0.5)
Eosinophils Relative: 2 %
HCT: 35.4 % — ABNORMAL LOW (ref 39.0–52.0)
Hemoglobin: 11.3 g/dL — ABNORMAL LOW (ref 13.0–17.0)
Immature Granulocytes: 0 %
Lymphocytes Relative: 26 %
Lymphs Abs: 1 10*3/uL (ref 0.7–4.0)
MCH: 27.7 pg (ref 26.0–34.0)
MCHC: 31.9 g/dL (ref 30.0–36.0)
MCV: 86.8 fL (ref 80.0–100.0)
Monocytes Absolute: 0.4 10*3/uL (ref 0.1–1.0)
Monocytes Relative: 11 %
Neutro Abs: 2.3 10*3/uL (ref 1.7–7.7)
Neutrophils Relative %: 60 %
Platelets: 147 10*3/uL — ABNORMAL LOW (ref 150–400)
RBC: 4.08 MIL/uL — ABNORMAL LOW (ref 4.22–5.81)
RDW: 19.4 % — ABNORMAL HIGH (ref 11.5–15.5)
WBC: 3.9 10*3/uL — ABNORMAL LOW (ref 4.0–10.5)
nRBC: 0 % (ref 0.0–0.2)

## 2019-10-06 LAB — POCT HEMOGLOBIN-HEMACUE: Hemoglobin: 11.7 g/dL — ABNORMAL LOW (ref 13.0–17.0)

## 2019-10-06 LAB — RENAL FUNCTION PANEL
Albumin: 2.7 g/dL — ABNORMAL LOW (ref 3.5–5.0)
Anion gap: 12 (ref 5–15)
BUN: 88 mg/dL — ABNORMAL HIGH (ref 8–23)
CO2: 18 mmol/L — ABNORMAL LOW (ref 22–32)
Calcium: 9.2 mg/dL (ref 8.9–10.3)
Chloride: 109 mmol/L (ref 98–111)
Creatinine, Ser: 8.02 mg/dL — ABNORMAL HIGH (ref 0.61–1.24)
GFR calc Af Amer: 7 mL/min — ABNORMAL LOW (ref >60)
GFR calc non Af Amer: 6 mL/min — ABNORMAL LOW (ref >60)
Glucose, Bld: 183 mg/dL — ABNORMAL HIGH (ref 70–99)
Phosphorus: 5.3 mg/dL — ABNORMAL HIGH (ref 2.5–4.6)
Potassium: 4.5 mmol/L (ref 3.5–5.1)
Sodium: 139 mmol/L (ref 135–145)

## 2019-10-06 LAB — HEPATITIS B SURFACE ANTIGEN: Hepatitis B Surface Ag: NONREACTIVE

## 2019-10-06 MED ORDER — DARBEPOETIN ALFA 100 MCG/0.5ML IJ SOSY
PREFILLED_SYRINGE | INTRAMUSCULAR | Status: AC
  Filled 2019-10-06: qty 0.5

## 2019-10-06 MED ORDER — DARBEPOETIN ALFA 100 MCG/0.5ML IJ SOSY
100.0000 ug | PREFILLED_SYRINGE | INTRAMUSCULAR | Status: DC
  Administered 2019-10-06: 100 ug via SUBCUTANEOUS

## 2019-10-20 ENCOUNTER — Ambulatory Visit (HOSPITAL_COMMUNITY)
Admission: RE | Admit: 2019-10-20 | Discharge: 2019-10-20 | Disposition: A | Discharge status: Home / Self Care | Payer: Medicare Other | Source: Ambulatory Visit | Attending: Nephrology | Admitting: Nephrology

## 2019-10-20 ENCOUNTER — Other Ambulatory Visit: Payer: Self-pay

## 2019-10-20 VITALS — BP 155/65 | HR 52 | Temp 95.9°F | Resp 20

## 2019-10-20 DIAGNOSIS — D508 Other iron deficiency anemias: Secondary | ICD-10-CM | POA: Diagnosis present

## 2019-10-20 DIAGNOSIS — I701 Atherosclerosis of renal artery: Secondary | ICD-10-CM | POA: Insufficient documentation

## 2019-10-20 LAB — POCT HEMOGLOBIN-HEMACUE: Hemoglobin: 12.2 g/dL — ABNORMAL LOW (ref 13.0–17.0)

## 2019-10-20 MED ORDER — DARBEPOETIN ALFA 100 MCG/0.5ML IJ SOSY: 100.0000 ug | PREFILLED_SYRINGE | INTRAMUSCULAR | Status: DC

## 2019-11-03 ENCOUNTER — Other Ambulatory Visit: Payer: Self-pay

## 2019-11-03 ENCOUNTER — Encounter (HOSPITAL_COMMUNITY)
Admission: RE | Admit: 2019-11-03 | Discharge: 2019-11-03 | Disposition: A | Discharge status: Home / Self Care | Payer: Medicare Other | Source: Ambulatory Visit | Attending: Nephrology | Admitting: Nephrology

## 2019-11-03 VITALS — BP 162/65 | HR 52 | Temp 97.3°F | Resp 20

## 2019-11-03 DIAGNOSIS — D508 Other iron deficiency anemias: Secondary | ICD-10-CM

## 2019-11-03 DIAGNOSIS — N184 Chronic kidney disease, stage 4 (severe): Secondary | ICD-10-CM | POA: Diagnosis not present

## 2019-11-03 DIAGNOSIS — D631 Anemia in chronic kidney disease: Secondary | ICD-10-CM | POA: Insufficient documentation

## 2019-11-03 DIAGNOSIS — I701 Atherosclerosis of renal artery: Secondary | ICD-10-CM

## 2019-11-03 LAB — CBC WITH DIFFERENTIAL/PLATELET
Abs Immature Granulocytes: 0 10*3/uL (ref 0.00–0.07)
Basophils Absolute: 0.1 10*3/uL (ref 0.0–0.1)
Basophils Relative: 1 %
Eosinophils Absolute: 0.1 10*3/uL (ref 0.0–0.5)
Eosinophils Relative: 1 %
HCT: 36.1 % — ABNORMAL LOW (ref 39.0–52.0)
Hemoglobin: 11.4 g/dL — ABNORMAL LOW (ref 13.0–17.0)
Immature Granulocytes: 0 %
Lymphocytes Relative: 33 %
Lymphs Abs: 1.6 10*3/uL (ref 0.7–4.0)
MCH: 27.5 pg (ref 26.0–34.0)
MCHC: 31.6 g/dL (ref 30.0–36.0)
MCV: 87 fL (ref 80.0–100.0)
Monocytes Absolute: 0.4 10*3/uL (ref 0.1–1.0)
Monocytes Relative: 9 %
Neutro Abs: 2.7 10*3/uL (ref 1.7–7.7)
Neutrophils Relative %: 56 %
Platelets: 161 10*3/uL (ref 150–400)
RBC: 4.15 MIL/uL — ABNORMAL LOW (ref 4.22–5.81)
RDW: 16.7 % — ABNORMAL HIGH (ref 11.5–15.5)
WBC: 4.9 10*3/uL (ref 4.0–10.5)
nRBC: 0 % (ref 0.0–0.2)

## 2019-11-03 LAB — RENAL FUNCTION PANEL
Albumin: 2.6 g/dL — ABNORMAL LOW (ref 3.5–5.0)
Anion gap: 12 (ref 5–15)
BUN: 94 mg/dL — ABNORMAL HIGH (ref 8–23)
CO2: 21 mmol/L — ABNORMAL LOW (ref 22–32)
Calcium: 8.3 mg/dL — ABNORMAL LOW (ref 8.9–10.3)
Chloride: 105 mmol/L (ref 98–111)
Creatinine, Ser: 8.62 mg/dL — ABNORMAL HIGH (ref 0.61–1.24)
GFR calc Af Amer: 6 mL/min — ABNORMAL LOW (ref >60)
GFR calc non Af Amer: 5 mL/min — ABNORMAL LOW (ref >60)
Glucose, Bld: 119 mg/dL — ABNORMAL HIGH (ref 70–99)
Phosphorus: 6.2 mg/dL — ABNORMAL HIGH (ref 2.5–4.6)
Potassium: 4.3 mmol/L (ref 3.5–5.1)
Sodium: 138 mmol/L (ref 135–145)

## 2019-11-03 LAB — POCT HEMOGLOBIN-HEMACUE: Hemoglobin: 11.2 g/dL — ABNORMAL LOW (ref 13.0–17.0)

## 2019-11-03 MED ORDER — DARBEPOETIN ALFA 100 MCG/0.5ML IJ SOSY
PREFILLED_SYRINGE | INTRAMUSCULAR | Status: AC
  Filled 2019-11-03: qty 0.5

## 2019-11-03 MED ORDER — DARBEPOETIN ALFA 100 MCG/0.5ML IJ SOSY
100.0000 ug | PREFILLED_SYRINGE | INTRAMUSCULAR | Status: DC
  Administered 2019-11-03: 100 ug via SUBCUTANEOUS

## 2019-11-14 ENCOUNTER — Encounter (HOSPITAL_COMMUNITY): Payer: Self-pay | Admitting: Emergency Medicine

## 2019-11-14 ENCOUNTER — Other Ambulatory Visit: Payer: Self-pay

## 2019-11-14 ENCOUNTER — Inpatient Hospital Stay (HOSPITAL_COMMUNITY)
Admission: EM | Admit: 2019-11-14 | Discharge: 2019-11-16 | DRG: 291 | Disposition: A | Discharge status: Home / Self Care | Payer: Medicare Other | Attending: Internal Medicine | Admitting: Internal Medicine

## 2019-11-14 ENCOUNTER — Emergency Department (HOSPITAL_COMMUNITY): Payer: Medicare Other

## 2019-11-14 DIAGNOSIS — I5031 Acute diastolic (congestive) heart failure: Secondary | ICD-10-CM

## 2019-11-14 DIAGNOSIS — Z7982 Long term (current) use of aspirin: Secondary | ICD-10-CM

## 2019-11-14 DIAGNOSIS — D631 Anemia in chronic kidney disease: Secondary | ICD-10-CM | POA: Diagnosis present

## 2019-11-14 DIAGNOSIS — I132 Hypertensive heart and chronic kidney disease with heart failure and with stage 5 chronic kidney disease, or end stage renal disease: Secondary | ICD-10-CM | POA: Diagnosis not present

## 2019-11-14 DIAGNOSIS — I5033 Acute on chronic diastolic (congestive) heart failure: Secondary | ICD-10-CM | POA: Diagnosis present

## 2019-11-14 DIAGNOSIS — I161 Hypertensive emergency: Secondary | ICD-10-CM

## 2019-11-14 DIAGNOSIS — E7800 Pure hypercholesterolemia, unspecified: Secondary | ICD-10-CM | POA: Diagnosis present

## 2019-11-14 DIAGNOSIS — G4733 Obstructive sleep apnea (adult) (pediatric): Secondary | ICD-10-CM | POA: Diagnosis present

## 2019-11-14 DIAGNOSIS — N179 Acute kidney failure, unspecified: Secondary | ICD-10-CM | POA: Diagnosis not present

## 2019-11-14 DIAGNOSIS — J9601 Acute respiratory failure with hypoxia: Secondary | ICD-10-CM

## 2019-11-14 DIAGNOSIS — J811 Chronic pulmonary edema: Secondary | ICD-10-CM

## 2019-11-14 DIAGNOSIS — N186 End stage renal disease: Secondary | ICD-10-CM

## 2019-11-14 DIAGNOSIS — Z20822 Contact with and (suspected) exposure to covid-19: Secondary | ICD-10-CM | POA: Diagnosis present

## 2019-11-14 DIAGNOSIS — Z87891 Personal history of nicotine dependence: Secondary | ICD-10-CM

## 2019-11-14 DIAGNOSIS — Z7682 Awaiting organ transplant status: Secondary | ICD-10-CM

## 2019-11-14 DIAGNOSIS — E1129 Type 2 diabetes mellitus with other diabetic kidney complication: Secondary | ICD-10-CM | POA: Diagnosis present

## 2019-11-14 DIAGNOSIS — Z809 Family history of malignant neoplasm, unspecified: Secondary | ICD-10-CM

## 2019-11-14 DIAGNOSIS — I248 Other forms of acute ischemic heart disease: Secondary | ICD-10-CM

## 2019-11-14 DIAGNOSIS — R778 Other specified abnormalities of plasma proteins: Secondary | ICD-10-CM

## 2019-11-14 DIAGNOSIS — Z79899 Other long term (current) drug therapy: Secondary | ICD-10-CM

## 2019-11-14 DIAGNOSIS — F419 Anxiety disorder, unspecified: Secondary | ICD-10-CM | POA: Diagnosis present

## 2019-11-14 DIAGNOSIS — M1990 Unspecified osteoarthritis, unspecified site: Secondary | ICD-10-CM | POA: Diagnosis present

## 2019-11-14 DIAGNOSIS — E785 Hyperlipidemia, unspecified: Secondary | ICD-10-CM | POA: Diagnosis present

## 2019-11-14 DIAGNOSIS — E1122 Type 2 diabetes mellitus with diabetic chronic kidney disease: Secondary | ICD-10-CM | POA: Diagnosis present

## 2019-11-14 DIAGNOSIS — Z09 Encounter for follow-up examination after completed treatment for conditions other than malignant neoplasm: Secondary | ICD-10-CM

## 2019-11-14 DIAGNOSIS — I160 Hypertensive urgency: Secondary | ICD-10-CM

## 2019-11-14 DIAGNOSIS — N185 Chronic kidney disease, stage 5: Secondary | ICD-10-CM | POA: Diagnosis present

## 2019-11-14 DIAGNOSIS — Z923 Personal history of irradiation: Secondary | ICD-10-CM

## 2019-11-14 DIAGNOSIS — Z8546 Personal history of malignant neoplasm of prostate: Secondary | ICD-10-CM

## 2019-11-14 DIAGNOSIS — I509 Heart failure, unspecified: Secondary | ICD-10-CM

## 2019-11-14 DIAGNOSIS — E872 Acidosis: Secondary | ICD-10-CM | POA: Diagnosis present

## 2019-11-14 LAB — CBC WITH DIFFERENTIAL/PLATELET
Abs Immature Granulocytes: 0.01 K/uL (ref 0.00–0.07)
Basophils Absolute: 0.1 K/uL (ref 0.0–0.1)
Basophils Relative: 1 %
Eosinophils Absolute: 0.1 K/uL (ref 0.0–0.5)
Eosinophils Relative: 2 %
HCT: 46.9 % (ref 39.0–52.0)
Hemoglobin: 14.8 g/dL (ref 13.0–17.0)
Immature Granulocytes: 0 %
Lymphocytes Relative: 19 %
Lymphs Abs: 1.4 K/uL (ref 0.7–4.0)
MCH: 27.8 pg (ref 26.0–34.0)
MCHC: 31.6 g/dL (ref 30.0–36.0)
MCV: 88 fL (ref 80.0–100.0)
Monocytes Absolute: 0.6 K/uL (ref 0.1–1.0)
Monocytes Relative: 9 %
Neutro Abs: 4.9 K/uL (ref 1.7–7.7)
Neutrophils Relative %: 69 %
Platelets: 254 K/uL (ref 150–400)
RBC: 5.33 MIL/uL (ref 4.22–5.81)
RDW: 17.8 % — ABNORMAL HIGH (ref 11.5–15.5)
WBC: 7.1 K/uL (ref 4.0–10.5)
nRBC: 0 % (ref 0.0–0.2)

## 2019-11-14 LAB — BASIC METABOLIC PANEL
Anion gap: 15 (ref 5–15)
BUN: 80 mg/dL — ABNORMAL HIGH (ref 8–23)
CO2: 18 mmol/L — ABNORMAL LOW (ref 22–32)
Calcium: 9.2 mg/dL (ref 8.9–10.3)
Chloride: 107 mmol/L (ref 98–111)
Creatinine, Ser: 9.15 mg/dL — ABNORMAL HIGH (ref 0.61–1.24)
GFR calc Af Amer: 6 mL/min — ABNORMAL LOW (ref >60)
GFR calc non Af Amer: 5 mL/min — ABNORMAL LOW (ref >60)
Glucose, Bld: 119 mg/dL — ABNORMAL HIGH (ref 70–99)
Potassium: 4.3 mmol/L (ref 3.5–5.1)
Sodium: 140 mmol/L (ref 135–145)

## 2019-11-14 MED ORDER — ALBUTEROL SULFATE (2.5 MG/3ML) 0.083% IN NEBU: 5.0000 mg | INHALATION_SOLUTION | Freq: Once | RESPIRATORY_TRACT | Status: DC

## 2019-11-14 MED ORDER — FUROSEMIDE 10 MG/ML IJ SOLN
40.0000 mg | Freq: Once | INTRAMUSCULAR | Status: AC
  Administered 2019-11-15: 40 mg via INTRAVENOUS
  Filled 2019-11-14: qty 4

## 2019-11-14 NOTE — ED Provider Notes (Signed)
[facility] EMERGENCY DEPARTMENT Provider Note   CSN: [id number] Arrival date & time: 11/14/19  2203   History Chief Complaint  Patient presents with  . Shortness of Breath    Michael Blackwell is a 79 y.o. male.  The history is provided by the patient.  Shortness of Breath He has history of hypertension, diabetes, hyperlipidemia, renal insufficiency currently on transplant list and comes in because of shortness of breath which started tonight.  Dyspnea was worse if he lay flat.  He denies chest pain, heaviness, tightness, pressure.  There has been a slight cough which is nonproductive.  He denies fever or chills.  He denies arthralgias or myalgias.  There has been no change in sense of smell or taste.  Denies exposure to COVID-19.  He did receive his first dose of COVID-19 vaccine today.  Past Medical History:  Diagnosis Date  . Acute respiratory failure with hypoxia ([place]) 12/05/2016   [name] 12/05/2016  . Anemia   . Anxiety   . Arthritis    "joints ache at night sometimes" (3/8/20180  . Chronic kidney disease (CKD), stage II (mild)    Acute on chronic kidney disease stage II-III/notes 12/06/2016  . Dyspnea    with exertion  . Heart murmur    never has caused any problems per patient  . History of blood transfusion   . History of radiation therapy 02/25/14- 04/23/14   prostate 7800 cGy 40 sessions, seminal vesicles 5600 cGy 40 sessions  . Hypercholesteremia   . Hypertension   . OSA on CPAP   . Pneumonia 12/06/2016  . Prostate cancer ([place]) 11/19/13   gleason 4+3=7, volume 34.74 cc  . Spermatocele    right  . Type II diabetes mellitus (HCC)    diet controlled  . Wears partial dentures    upper    Patient Active Problem List   Diagnosis Date Noted  . Symptomatic anemia 04/06/2019  . 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 Renal artery stenosis ([place]) 02/24/2018  . Essential hypertension, malignant 12/10/2016  . Hyperlipidemia LDL goal  <100 12/10/2016  . Iron deficiency anemia 12/10/2016  . Type 2 diabetes mellitus with renal manifestations ([place]) 12/05/2016  . Malignant neoplasm of prostate ([place]) 01/12/2014    Past Surgical History:  Procedure Laterality Date  . AV FISTULA PLACEMENT Right 03/04/2019   Procedure: RIGHT ARM ARTERIOVENOUS  FISTULA CREATION;  Surgeon: [name], MD;  Location: [facility];  Service: Vascular;  Laterality: Right;  . BASCILIC VEIN TRANSPOSITION Right 05/26/2019   Procedure: Right Arm BASILIC VEIN TRANSPOSITION SECOND STAGE;  Surgeon: [name], MD;  Location: [facility];  Service: Vascular;  Laterality: Right;  . COLONOSCOPY    . LOWER EXTREMITY ANGIOGRAPHY Bilateral 02/25/2018   Procedure: Lower Extremity Angiography;  Surgeon: [name], MD;  Location: [facility];  Service: Cardiovascular;  Laterality: Bilateral;  limited  . PROSTATE BIOPSY  11/19/13   Gleason 4+3=7, vol 34.74 cc  . RENAL ANGIOGRAPHY N/A 02/25/2018   Procedure: RENAL ANGIOGRAPHY;  Surgeon: [name], MD;  Location: [facility];  Service: Cardiovascular;  Laterality: N/A;  . TONSILLECTOMY     as child       Family History  Problem Relation Age of Onset  . Cancer Brother        brain  . Cancer Mother        stomach  . Cancer Father        ?  prostate    Social History   Tobacco Use  . Smoking status: Former Smoker    Years: 48.00    Types: Cigarettes    Quit date: 07/01/2018    Years since quitting: 1.3  . Smokeless tobacco: Never Used  . Tobacco comment: "I smoke 1 cig a month"- per patient on 05/25/19  Substance Use Topics  . Alcohol use: Not Currently  . Drug use: No    Home Medications Prior to Admission medications   Medication Sig Start Date End Date Taking? Authorizing Provider  amLODipine (NORVASC) 10 MG tablet Take 1 tablet by mouth once daily 10/01/19   [name], NP  aspirin 81 MG tablet Take 81 mg by mouth at bedtime.      [provider]  bismuth subsalicylate (PEPTO BISMOL) 262 MG/15ML suspension Take 30 mLs by mouth every 6 (six) hours as needed for indigestion.    [provider]  calcitRIOL (ROCALTROL) 0.25 MCG capsule Take 0.25 mcg by mouth daily. 12/02/18   [provider]  Calcium Carb-Cholecalciferol (CALCIUM 600+D) 600-800 MG-UNIT TABS Take 1 tablet by mouth 2 (two) times a day.    [provider]  carvedilol (COREG) 12.5 MG tablet Take 12.5 mg by mouth 2 (two) times daily. 12/31/18   [provider]  cholecalciferol (VITAMIN D3) 25 MCG (1000 UT) tablet Take 1,000 Units by mouth daily.    [provider]  cloNIDine (CATAPRES) 0.1 MG tablet Take 0.1 mg by mouth 2 (two) times daily.  12/12/15   [provider]  famotidine (PEPCID) 20 MG tablet Take 20 mg by mouth daily as needed for heartburn or indigestion.    [provider]  Ferrous Sulfate (IRON) 325 (65 Fe) MG TABS Take 1 tablet by mouth twice daily 09/08/19   [name], NP  furosemide (LASIX) 40 MG tablet Take 1.5 tablets (60 mg total) by mouth 2 (two) times daily. 12/10/16   Dhungel, Nishant, MD  HYDRALAZINE HCL PO Take 100 mg by mouth 3 (three) times daily.    [provider]  Multiple Vitamins-Minerals (CENTRUM SILVER 50+MEN) TABS Take 1 tablet by mouth daily.    [provider]  Omega-3 Fatty Acids (OMEGA 3 PO) Take 1,040 mg by mouth 2 (two) times daily.     [provider]  phenylephrine (NEO-SYNEPHRINE COLD & SINUS) 1 % nasal spray Place 1 drop into both nostrils at bedtime as needed for congestion.     [provider]  potassium chloride SA (K-DUR,KLOR-CON) 20 MEQ tablet Take 1 tablet (20 mEq total) by mouth daily. 12/10/16   Dhungel, Flonnie Overman, MD  rosuvastatin (CRESTOR) 10 MG tablet Take 1 tablet by mouth once daily 08/26/19   [name], NP  tamsulosin (FLOMAX) 0.4 MG CAPS capsule Take 1 capsule (0.4 mg total) by mouth  daily. Patient taking differently: Take 0.4 mg by mouth every other day.  04/12/14   [name], MD    Allergies    Patient has no known allergies.  Review of Systems   Review of Systems  Respiratory: Positive for shortness of breath.   All other systems reviewed and are negative.   Physical Exam Updated Vital Signs BP (!) 214/94   Pulse 83   Temp 98.2 F (36.8 C) (Oral)   Resp 20   SpO2 96%   Physical Exam Vitals and nursing note reviewed.   79 year old male, resting comfortably and in no acute distress. Vital signs are significant for  elevated blood pressure. Oxygen saturation is 96%, which is normal. Head is normocephalic and atraumatic. PERRLA, EOMI. Oropharynx is clear. Neck is nontender and supple without adenopathy. JVD is present. Back is nontender and there is no CVA tenderness. Lungs have bibasilar rales without wheezes or rhonchi. Chest is nontender. Heart has regular rate and rhythm without murmur. Abdomen is soft, flat, nontender without masses or hepatosplenomegaly and peristalsis is normoactive. Extremities have 1+ edema, full range of motion is present.  AV fistula present in the right arm with thrill present. Skin is warm and dry without rash. Neurologic: Mental status is normal, cranial nerves are intact, there are no motor or sensory deficits.  ED Results / Procedures / Treatments   Labs (all labs ordered are listed, but only abnormal results are displayed) Labs Reviewed  CBC WITH DIFFERENTIAL/PLATELET - Abnormal; Notable for the following components:      Result Value   RDW 17.8 (*)    All other components within normal limits  BASIC METABOLIC PANEL - Abnormal; Notable for the following components:   CO2 18 (*)    Glucose, Bld 119 (*)    BUN 80 (*)    Creatinine, Ser 9.15 (*)    GFR calc non Af Amer 5 (*)    GFR calc Af Amer 6 (*)    All other components within normal limits  BRAIN NATRIURETIC PEPTIDE - Abnormal; Notable for the following  components:   B Natriuretic Peptide 3,544.8 (*)    All other components within normal limits  TROPONIN I (HIGH SENSITIVITY) - Abnormal; Notable for the following components:   Troponin I (High Sensitivity) 117 (*)    All other components within normal limits  SARS CORONAVIRUS 2 (TAT 6-24 HRS)  TROPONIN I (HIGH SENSITIVITY)    EKG EKG Interpretation  Date/Time:  Saturday November 14 2019 22:18:54 EST Ventricular Rate:  90 PR Interval:  168 QRS Duration: 86 QT Interval:  380 QTC Calculation: 464 R Axis:   6 Text Interpretation: Sinus rhythm with Fusion complexes Moderate voltage criteria for LVH, may be normal variant ( R in aVL , Cornell product ) Borderline ECG When compared with ECG of 03/04/2019, No significant change was found Confirmed by Delora Fuel ([id number]) on 11/14/2019 11:03:53 PM   Radiology DG Chest 2 View  Result Date: 11/14/2019 CLINICAL DATA:  Difficulty breathing. On dialysis. Status post COVID vaccine today. EXAM: CHEST - 2 VIEW COMPARISON:  12/05/2016. FINDINGS: Pulmonary vascular congestion with suspected mild interstitial edema. No definite pleural effusions. No pneumothorax. Cardiomegaly. Visualized osseous structures are within normal limits. IMPRESSION: Cardiomegaly with suspected mild interstitial edema. No definite pleural effusions. Electronically Signed   By: Julian Hy M.D.   On: 11/14/2019 23:31    Procedures Procedures  CRITICAL CARE Performed by: Delora Fuel Total critical care time: 45 minutes Critical care time was exclusive of separately billable procedures and treating other patients. Critical care was necessary to treat or prevent imminent or life-threatening deterioration. Critical care was time spent personally by me on the following activities: development of treatment plan with patient and/or surrogate as well as nursing, discussions with consultants, evaluation of patient's response to treatment, examination of patient, obtaining history  from patient or surrogate, ordering and performing treatments and interventions, ordering and review of laboratory studies, ordering and review of radiographic studies, pulse oximetry and re-evaluation of patient's condition.  Medications Ordered in ED Medications  albuterol (PROVENTIL) (2.5 MG/3ML) 0.083% nebulizer solution 5 mg (has no administration in time range)  ED Course  I have reviewed the triage vital signs and the nursing notes.  Pertinent labs & imaging results that were available during my care of the patient were reviewed by me and considered in my medical decision making (see chart for details).  MDM Rules/Calculators/A&P Shortness of breath with physical findings strongly suggestive of heart failure.  Minimal respiratory symptoms to suggest pneumonia or bronchitis.  No risk factors for pulmonary embolism.  Will check chest x-ray and screening labs.  Markedly elevated systolic blood pressure could be causing acute heart strain as a potential etiology.  Of note, it was not recorded but triage note states oxygen saturation was 88-90% on room air which improved to the normal range when placed on nasal oxygen.  Old records are reviewed, and he has no relevant past visits.  Echocardiogram done in March 2018 does show grade 1 diastolic dysfunction, inferior hypokinesis with preserved ejection action fraction of 50-55%.  Chest x-ray is consistent with heart failure.  ECG is unchanged from prior.  Labs are significant for slight worsening in renal function (creatinine 9.15 compared with 8.62 on 11/03/2019 and 8.02 on 10/05/18/2021 and 7.05 on 08/11/2019).  BNP is markedly elevated and troponin is mildly elevated.  He was given intravenous furosemide and started on topical nitroglycerin and given intravenous labetalol for his blood pressure.  Blood pressure has come down although is still elevated.  Case is discussed with Dr. Marlowe Sax of [facility], who agrees to admit the patient.  Final  Clinical Impression(s) / ED Diagnoses Final diagnoses:  Acute diastolic heart failure ([place])  Hypertensive urgency  End stage renal disease (HCC)  Elevated troponin    Rx / DC Orders ED Discharge Orders    None       Delora Fuel, MD 15/94/58 0210

## 2019-11-14 NOTE — ED Triage Notes (Addendum)
Pt presents to ED from home POV. Pt c/o "difficulty breathing" that began a few hours ago while at rest. Pt had first COVID vaccine today. Pt's resp u.e in triage but O2 88-90% on RA. Pt placed on 2L [place]. Pt reports no hx of resp issues. Pt is dialysis pt, report not missing any treatments

## 2019-11-15 ENCOUNTER — Inpatient Hospital Stay (HOSPITAL_COMMUNITY): Payer: Medicare Other

## 2019-11-15 ENCOUNTER — Encounter (HOSPITAL_COMMUNITY): Payer: Self-pay | Admitting: Emergency Medicine

## 2019-11-15 DIAGNOSIS — E785 Hyperlipidemia, unspecified: Secondary | ICD-10-CM | POA: Diagnosis present

## 2019-11-15 DIAGNOSIS — Z8546 Personal history of malignant neoplasm of prostate: Secondary | ICD-10-CM | POA: Diagnosis not present

## 2019-11-15 DIAGNOSIS — Z809 Family history of malignant neoplasm, unspecified: Secondary | ICD-10-CM | POA: Diagnosis not present

## 2019-11-15 DIAGNOSIS — G4733 Obstructive sleep apnea (adult) (pediatric): Secondary | ICD-10-CM | POA: Diagnosis present

## 2019-11-15 DIAGNOSIS — I161 Hypertensive emergency: Secondary | ICD-10-CM | POA: Diagnosis present

## 2019-11-15 DIAGNOSIS — N179 Acute kidney failure, unspecified: Secondary | ICD-10-CM | POA: Diagnosis not present

## 2019-11-15 DIAGNOSIS — M1990 Unspecified osteoarthritis, unspecified site: Secondary | ICD-10-CM | POA: Diagnosis present

## 2019-11-15 DIAGNOSIS — D631 Anemia in chronic kidney disease: Secondary | ICD-10-CM | POA: Diagnosis present

## 2019-11-15 DIAGNOSIS — J9601 Acute respiratory failure with hypoxia: Secondary | ICD-10-CM | POA: Diagnosis present

## 2019-11-15 DIAGNOSIS — N185 Chronic kidney disease, stage 5: Secondary | ICD-10-CM | POA: Diagnosis present

## 2019-11-15 DIAGNOSIS — E7800 Pure hypercholesterolemia, unspecified: Secondary | ICD-10-CM | POA: Diagnosis present

## 2019-11-15 DIAGNOSIS — I132 Hypertensive heart and chronic kidney disease with heart failure and with stage 5 chronic kidney disease, or end stage renal disease: Secondary | ICD-10-CM | POA: Diagnosis present

## 2019-11-15 DIAGNOSIS — I248 Other forms of acute ischemic heart disease: Secondary | ICD-10-CM | POA: Diagnosis present

## 2019-11-15 DIAGNOSIS — I509 Heart failure, unspecified: Secondary | ICD-10-CM

## 2019-11-15 DIAGNOSIS — Z7682 Awaiting organ transplant status: Secondary | ICD-10-CM | POA: Diagnosis not present

## 2019-11-15 DIAGNOSIS — Z20822 Contact with and (suspected) exposure to covid-19: Secondary | ICD-10-CM | POA: Diagnosis present

## 2019-11-15 DIAGNOSIS — Z923 Personal history of irradiation: Secondary | ICD-10-CM | POA: Diagnosis not present

## 2019-11-15 DIAGNOSIS — I5031 Acute diastolic (congestive) heart failure: Secondary | ICD-10-CM | POA: Diagnosis present

## 2019-11-15 DIAGNOSIS — E8770 Fluid overload, unspecified: Secondary | ICD-10-CM | POA: Diagnosis not present

## 2019-11-15 DIAGNOSIS — I5030 Unspecified diastolic (congestive) heart failure: Secondary | ICD-10-CM | POA: Insufficient documentation

## 2019-11-15 DIAGNOSIS — R0902 Hypoxemia: Secondary | ICD-10-CM | POA: Diagnosis not present

## 2019-11-15 DIAGNOSIS — E1122 Type 2 diabetes mellitus with diabetic chronic kidney disease: Secondary | ICD-10-CM | POA: Diagnosis present

## 2019-11-15 DIAGNOSIS — Z7982 Long term (current) use of aspirin: Secondary | ICD-10-CM | POA: Diagnosis not present

## 2019-11-15 DIAGNOSIS — Z79899 Other long term (current) drug therapy: Secondary | ICD-10-CM | POA: Diagnosis not present

## 2019-11-15 DIAGNOSIS — E872 Acidosis: Secondary | ICD-10-CM | POA: Diagnosis present

## 2019-11-15 DIAGNOSIS — F419 Anxiety disorder, unspecified: Secondary | ICD-10-CM | POA: Diagnosis present

## 2019-11-15 DIAGNOSIS — Z87891 Personal history of nicotine dependence: Secondary | ICD-10-CM | POA: Diagnosis not present

## 2019-11-15 DIAGNOSIS — I5033 Acute on chronic diastolic (congestive) heart failure: Secondary | ICD-10-CM | POA: Diagnosis present

## 2019-11-15 LAB — CBC WITH DIFFERENTIAL/PLATELET
Abs Immature Granulocytes: 0.02 10*3/uL (ref 0.00–0.07)
Basophils Absolute: 0 10*3/uL (ref 0.0–0.1)
Basophils Relative: 1 %
Eosinophils Absolute: 0.1 10*3/uL (ref 0.0–0.5)
Eosinophils Relative: 1 %
HCT: 40.9 % (ref 39.0–52.0)
Hemoglobin: 13.2 g/dL (ref 13.0–17.0)
Immature Granulocytes: 0 %
Lymphocytes Relative: 20 %
Lymphs Abs: 1.2 10*3/uL (ref 0.7–4.0)
MCH: 28 pg (ref 26.0–34.0)
MCHC: 32.3 g/dL (ref 30.0–36.0)
MCV: 86.7 fL (ref 80.0–100.0)
Monocytes Absolute: 0.5 10*3/uL (ref 0.1–1.0)
Monocytes Relative: 8 %
Neutro Abs: 4.3 10*3/uL (ref 1.7–7.7)
Neutrophils Relative %: 70 %
Platelets: 197 10*3/uL (ref 150–400)
RBC: 4.72 MIL/uL (ref 4.22–5.81)
RDW: 17.9 % — ABNORMAL HIGH (ref 11.5–15.5)
WBC: 6 10*3/uL (ref 4.0–10.5)
nRBC: 0 % (ref 0.0–0.2)

## 2019-11-15 LAB — BASIC METABOLIC PANEL
Anion gap: 15 (ref 5–15)
BUN: 80 mg/dL — ABNORMAL HIGH (ref 8–23)
CO2: 16 mmol/L — ABNORMAL LOW (ref 22–32)
Calcium: 8.8 mg/dL — ABNORMAL LOW (ref 8.9–10.3)
Chloride: 108 mmol/L (ref 98–111)
Creatinine, Ser: 9.39 mg/dL — ABNORMAL HIGH (ref 0.61–1.24)
GFR calc Af Amer: 6 mL/min — ABNORMAL LOW (ref >60)
GFR calc non Af Amer: 5 mL/min — ABNORMAL LOW (ref >60)
Glucose, Bld: 99 mg/dL (ref 70–99)
Potassium: 4.4 mmol/L (ref 3.5–5.1)
Sodium: 139 mmol/L (ref 135–145)

## 2019-11-15 LAB — TROPONIN I (HIGH SENSITIVITY)
Troponin I (High Sensitivity): 117 ng/L (ref <18)
Troponin I (High Sensitivity): 125 ng/L (ref <18)
Troponin I (High Sensitivity): 123 ng/L (ref <18)

## 2019-11-15 LAB — HEMOGLOBIN A1C
Hgb A1c MFr Bld: 6.1 % — ABNORMAL HIGH (ref 4.8–5.6)
Mean Plasma Glucose: 128.37 mg/dL

## 2019-11-15 LAB — GLUCOSE, CAPILLARY
Glucose-Capillary: 112 mg/dL — ABNORMAL HIGH (ref 70–99)
Glucose-Capillary: 134 mg/dL — ABNORMAL HIGH (ref 70–99)
Glucose-Capillary: 115 mg/dL — ABNORMAL HIGH (ref 70–99)
Glucose-Capillary: 130 mg/dL — ABNORMAL HIGH (ref 70–99)

## 2019-11-15 LAB — RESPIRATORY PANEL BY RT PCR (FLU A&B, COVID)
Influenza A by PCR: NEGATIVE
Influenza B by PCR: NEGATIVE
SARS Coronavirus 2 by RT PCR: NEGATIVE

## 2019-11-15 LAB — SARS CORONAVIRUS 2 (TAT 6-24 HRS): SARS Coronavirus 2: NEGATIVE

## 2019-11-15 LAB — BRAIN NATRIURETIC PEPTIDE: B Natriuretic Peptide: 3544.8 pg/mL — ABNORMAL HIGH (ref 0.0–100.0)

## 2019-11-15 LAB — MAGNESIUM: Magnesium: 2.2 mg/dL (ref 1.7–2.4)

## 2019-11-15 MED ORDER — HEPARIN SODIUM (PORCINE) 5000 UNIT/ML IJ SOLN
5000.0000 [IU] | Freq: Three times a day (TID) | INTRAMUSCULAR | Status: DC
  Administered 2019-11-15 – 2019-11-16 (×4): 5000 [IU] via SUBCUTANEOUS
  Filled 2019-11-15 (×4): qty 1

## 2019-11-15 MED ORDER — HYDRALAZINE HCL 50 MG PO TABS
100.0000 mg | ORAL_TABLET | Freq: Three times a day (TID) | ORAL | Status: DC
  Administered 2019-11-15 – 2019-11-16 (×4): 100 mg via ORAL
  Filled 2019-11-15 (×4): qty 2

## 2019-11-15 MED ORDER — LABETALOL HCL 5 MG/ML IV SOLN
10.0000 mg | INTRAVENOUS | Status: DC | PRN
  Administered 2019-11-15 – 2019-11-16 (×4): 10 mg via INTRAVENOUS
  Filled 2019-11-15 (×2): qty 4

## 2019-11-15 MED ORDER — LABETALOL HCL 5 MG/ML IV SOLN
20.0000 mg | INTRAVENOUS | Status: DC | PRN
  Administered 2019-11-15: 01:00:00 20 mg via INTRAVENOUS
  Filled 2019-11-15 (×2): qty 4

## 2019-11-15 MED ORDER — ACETAMINOPHEN 325 MG PO TABS: 650.0000 mg | ORAL_TABLET | ORAL | Status: DC | PRN

## 2019-11-15 MED ORDER — INSULIN ASPART 100 UNIT/ML ~~LOC~~ SOLN: 0.0000 [IU] | Freq: Three times a day (TID) | SUBCUTANEOUS | Status: DC

## 2019-11-15 MED ORDER — SODIUM CHLORIDE 0.9% FLUSH
3.0000 mL | Freq: Two times a day (BID) | INTRAVENOUS | Status: DC
  Administered 2019-11-15 (×2): 3 mL via INTRAVENOUS

## 2019-11-15 MED ORDER — CLONIDINE HCL 0.2 MG PO TABS
0.2000 mg | ORAL_TABLET | Freq: Once | ORAL | Status: AC
  Administered 2019-11-15: 0.2 mg via ORAL
  Filled 2019-11-15: qty 1

## 2019-11-15 MED ORDER — CLONIDINE HCL 0.2 MG PO TABS
0.2000 mg | ORAL_TABLET | Freq: Once | ORAL | Status: AC
  Administered 2019-11-15: 23:00:00 0.2 mg via ORAL
  Filled 2019-11-15: qty 1

## 2019-11-15 MED ORDER — SODIUM CHLORIDE 0.9% FLUSH: 3.0000 mL | INTRAVENOUS | Status: DC | PRN

## 2019-11-15 MED ORDER — SODIUM CHLORIDE 0.9 % IV SOLN: 250.0000 mL | INTRAVENOUS | Status: DC | PRN

## 2019-11-15 MED ORDER — FUROSEMIDE 10 MG/ML IJ SOLN: 120.0000 mg | Freq: Three times a day (TID) | INTRAVENOUS | Status: DC

## 2019-11-15 MED ORDER — HYDRALAZINE HCL 20 MG/ML IJ SOLN
10.0000 mg | INTRAMUSCULAR | Status: DC | PRN
  Administered 2019-11-15 (×2): 10 mg via INTRAVENOUS
  Filled 2019-11-15 (×2): qty 1

## 2019-11-15 MED ORDER — FUROSEMIDE 10 MG/ML IJ SOLN
100.0000 mg | Freq: Three times a day (TID) | INTRAVENOUS | Status: DC
  Administered 2019-11-15 – 2019-11-16 (×2): 100 mg via INTRAVENOUS
  Filled 2019-11-15 (×4): qty 10

## 2019-11-15 MED ORDER — FUROSEMIDE 10 MG/ML IJ SOLN
80.0000 mg | Freq: Once | INTRAMUSCULAR | Status: AC
  Administered 2019-11-15: 11:00:00 80 mg via INTRAVENOUS
  Filled 2019-11-15: qty 8

## 2019-11-15 MED ORDER — NITROGLYCERIN 2 % TD OINT
1.0000 [in_us] | TOPICAL_OINTMENT | Freq: Once | TRANSDERMAL | Status: AC
  Administered 2019-11-15: 01:00:00 1 [in_us] via TOPICAL
  Filled 2019-11-15: qty 1

## 2019-11-15 MED ORDER — INSULIN ASPART 100 UNIT/ML ~~LOC~~ SOLN: 0.0000 [IU] | Freq: Every day | SUBCUTANEOUS | Status: DC

## 2019-11-15 MED ORDER — CARVEDILOL 25 MG PO TABS
25.0000 mg | ORAL_TABLET | Freq: Two times a day (BID) | ORAL | Status: DC
  Administered 2019-11-15 – 2019-11-16 (×3): 25 mg via ORAL
  Filled 2019-11-15 (×3): qty 1

## 2019-11-15 NOTE — ED Notes (Signed)
SDU  Breakfast ordered  

## 2019-11-15 NOTE — Progress Notes (Signed)
  Echocardiogram 2D Echocardiogram has been performed.  Michael Blackwell 11/15/2019, 10:54 AM

## 2019-11-15 NOTE — Progress Notes (Signed)
Pt arrived to the floor via the stretcher, and ambulated to the bed with a cane. Stand up weight taken and heart monitor applied and CCMD notified.

## 2019-11-15 NOTE — H&P (Signed)
History and Physical    QAIS JOWERS HQP:[id number] DOB: Feb 26, 1941 DOA: 11/14/2019  PCP: Rogers Blocker, MD Patient coming from: Home  Chief Complaint: Shortness of breath  HPI: Michael Blackwell is a 79 y.o. 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  Patient states he received his first Covid vaccine today.  Later in the afternoon she started feeling short of breath.  Does think he has had some mild shortness of breath for the past few days and has been coughing.  Also endorses orthopnea and ankle edema.  States his nephrologist is Dr. Marval Regal and he is on the transplant list at [facility].  No fevers or recent sick contacts.  Denies chest pain.  States he has been taking all his blood pressure medications as prescribed.  ED Course: Afebrile.  Oxygen saturation 88 to 90% on room air, placed on 2 L supplemental oxygen.  Blood pressure significantly elevated with systolic up to 599.  Labs showing no leukocytosis.  Bicarb 18, anion gap 15.  Blood glucose 119.  BUN 80, creatinine 9.1.  Renal function has been declining over time and creatinine in the 7-8 range on recent labs.  High-sensitivity troponin 117.  EKG without acute ischemic changes.  BNP 3544.  SARS-CoV-2 PCR test pending.  Chest x-ray showing cardiomegaly with suspected mild interstitial edema.   Patient received IV Lasix 40 mg and nitroglycerin ointment.  Review of Systems:  All systems reviewed and apart from history of presenting illness, are negative.  Past Medical History:  Diagnosis Date  . Acute respiratory failure with hypoxia ([place]) 12/05/2016   [name] 12/05/2016  . Anemia   . Anxiety   . Arthritis    "joints ache at night sometimes" (3/8/20180  . Chronic  kidney disease (CKD), stage II (mild)    Acute on chronic kidney disease stage II-III/notes 12/06/2016  . Dyspnea    with exertion  . Heart murmur    never has caused any problems per patient  . History of blood transfusion   . History of radiation therapy 02/25/14- 04/23/14   prostate 7800 cGy 40 sessions, seminal vesicles 5600 cGy 40 sessions  . Hypercholesteremia   . Hypertension   . OSA on CPAP   . Pneumonia 12/06/2016  . Prostate cancer ([place]) 11/19/13   gleason 4+3=7, volume 34.74 cc  . Spermatocele    right  . Type II diabetes mellitus (HCC)    diet controlled  . Wears partial dentures    upper    Past Surgical History:  Procedure Laterality Date  . AV FISTULA PLACEMENT Right 03/04/2019   Procedure: RIGHT ARM ARTERIOVENOUS  FISTULA CREATION;  Surgeon: [name], MD;  Location: [place];  Service: Vascular;  Laterality: Right;  . BASCILIC VEIN TRANSPOSITION Right 05/26/2019   Procedure: Right Arm BASILIC VEIN TRANSPOSITION SECOND STAGE;  Surgeon: [name], MD;  Location: [place];  Service: Vascular;  Laterality: Right;  . COLONOSCOPY    . LOWER EXTREMITY ANGIOGRAPHY Bilateral 02/25/2018   Procedure: Lower Extremity Angiography;  Surgeon: [name], MD;  Location: [place] [facility];  Service: Cardiovascular;  Laterality: Bilateral;  limited  . PROSTATE BIOPSY  11/19/13   Gleason 4+3=7, vol 34.74 cc  . RENAL ANGIOGRAPHY  N/A 02/25/2018   Procedure: RENAL ANGIOGRAPHY;  Surgeon: [name], MD;  Location: [place] [facility];  Service: Cardiovascular;  Laterality: N/A;  . TONSILLECTOMY     as child     reports that he quit smoking about 16 months ago. His smoking use included cigarettes. He quit after 48.00 years of use. He has never used smokeless tobacco. He reports previous alcohol use. He reports that he does not use drugs.  No Known Allergies  Family History  Problem Relation Age of Onset  . Cancer Brother        brain  .  Cancer Mother        stomach  . Cancer Father        ? prostate    Prior to Admission medications   Medication Sig Start Date End Date Taking? Authorizing Provider  amLODipine (NORVASC) 10 MG tablet Take 1 tablet by mouth once daily Patient taking differently: Take 5 mg by mouth daily.  10/01/19  Yes [name], NP  aspirin 81 MG tablet Take 81 mg by mouth at bedtime.    Yes [provider]  calcitRIOL (ROCALTROL) 0.25 MCG capsule Take 0.25 mcg by mouth daily. 12/02/18  Yes [provider]  Calcium Carb-Cholecalciferol (CALCIUM 600+D) 600-800 MG-UNIT TABS Take 1 tablet by mouth 2 (two) times a day.   Yes [provider]  carvedilol (COREG) 25 MG tablet Take 25 mg by mouth 2 (two) times daily.  12/31/18  Yes [provider]  cholecalciferol (VITAMIN D3) 25 MCG (1000 UT) tablet Take 1,000 Units by mouth daily.   Yes [provider]  cloNIDine (CATAPRES) 0.3 MG tablet Take 0.3 mg by mouth 2 (two) times daily.  12/12/15  Yes [provider]  Ferrous Sulfate (IRON) 325 (65 Fe) MG TABS Take 1 tablet by mouth twice daily Patient taking differently: Take 325 mg by mouth in the morning and at bedtime.  09/08/19  Yes [name], NP  furosemide (LASIX) 40 MG tablet Take 1.5 tablets (60 mg total) by mouth 2 (two) times daily. 12/10/16  Yes Dhungel, Nishant, MD  hydrALAZINE (APRESOLINE) 100 MG tablet Take 100 mg by mouth 3 (three) times daily.    Yes [provider]  Multiple Vitamins-Minerals (CENTRUM SILVER 50+MEN) TABS Take 1 tablet by mouth daily.   Yes [provider]  Omega-3 Fatty Acids (OMEGA 3 PO) Take 1,040 mg by mouth 2 (two) times daily.    Yes [provider]  phenylephrine (NEO-SYNEPHRINE COLD & SINUS) 1 % nasal spray Place 1 drop into both nostrils at bedtime as needed for congestion.    Yes [provider]  potassium chloride SA (K-DUR,KLOR-CON) 20 MEQ tablet Take 1 tablet (20 mEq total)  by mouth daily. 12/10/16  Yes Dhungel, Nishant, MD  rosuvastatin (CRESTOR) 10 MG tablet Take 1 tablet by mouth once daily Patient taking differently: Take 10 mg by mouth daily.  08/26/19  Yes [name], NP  tamsulosin (FLOMAX) 0.4 MG CAPS capsule Take 1 capsule (0.4 mg total) by mouth daily. Patient taking differently: Take 0.4 mg by mouth every other day.  04/12/14  Yes [name], MD  bismuth subsalicylate (PEPTO BISMOL) 262 MG/15ML suspension Take 30 mLs by mouth every 6 (six) hours as needed for indigestion.    [provider]    Physical Exam: Vitals:   11/15/19 0115 11/15/19 0130 11/15/19 0145 11/15/19 0231  BP: (!) 175/157 (!) 186/79 (!) 199/91 (!) 203/90  Pulse: 85 79 78 81  Resp: (!) 21 (!) 32 17 20  Temp:    97.8 F (36.6 C)  TempSrc:    Oral  SpO2: 99% 97% 98% 94%    Physical Exam  Constitutional: He is oriented to person, place, and time. He appears well-developed and well-nourished. No distress.  HENT:  Head: Normocephalic.  Eyes: Right eye exhibits no discharge. Left eye exhibits no discharge.  Cardiovascular: Normal rate, regular rhythm and intact distal pulses.  Pulmonary/Chest: Effort normal. He has no wheezes. He has rales.  On 2 L supplemental oxygen Bibasilar rales  Abdominal: Soft. Bowel sounds are normal. He exhibits no distension. There is no abdominal tenderness. There is no guarding.  Musculoskeletal:        General: Edema present.     Cervical back: Neck supple.     Comments: +1 edema pitting edema at the level of ankles  Neurological: He is alert and oriented to person, place, and time.  Skin: Skin is warm and dry. He is not diaphoretic.     Labs on Admission: I have personally reviewed following labs and imaging studies  CBC: Recent Labs  Lab 11/14/19 2229  WBC 7.1  NEUTROABS 4.9  HGB 14.8  HCT 46.9  MCV 88.0  PLT 073   Basic Metabolic Panel: Recent Labs  Lab 11/14/19 2229  NA 140  K 4.3  CL 107  CO2 18*    GLUCOSE 119*  BUN 80*  CREATININE 9.15*  CALCIUM 9.2   GFR: CrCl cannot be calculated (Unknown ideal weight.). Liver Function Tests: No results for input(s): AST, ALT, ALKPHOS, BILITOT, PROT, ALBUMIN in the last 168 hours. No results for input(s): LIPASE, AMYLASE in the last 168 hours. No results for input(s): AMMONIA in the last 168 hours. Coagulation Profile: No results for input(s): INR, PROTIME in the last 168 hours. Cardiac Enzymes: No results for input(s): CKTOTAL, CKMB, CKMBINDEX, TROPONINI in the last 168 hours. BNP (last 3 results) No results for input(s): PROBNP in the last 8760 hours. HbA1C: No results for input(s): HGBA1C in the last 72 hours. CBG: No results for input(s): GLUCAP in the last 168 hours. Lipid Profile: No results for input(s): CHOL, HDL, LDLCALC, TRIG, CHOLHDL, LDLDIRECT in the last 72 hours. Thyroid Function Tests: No results for input(s): TSH, T4TOTAL, FREET4, T3FREE, THYROIDAB in the last 72 hours. Anemia Panel: No results for input(s): VITAMINB12, FOLATE, FERRITIN, TIBC, IRON, RETICCTPCT in the last 72 hours. Urine analysis: No results found for: COLORURINE, APPEARANCEUR, LABSPEC, PHURINE, GLUCOSEU, HGBUR, BILIRUBINUR, KETONESUR, PROTEINUR, UROBILINOGEN, NITRITE, LEUKOCYTESUR  Radiological Exams on Admission: DG Chest 2 View  Result Date: 11/14/2019 CLINICAL DATA:  Difficulty breathing. On dialysis. Status post COVID vaccine today. EXAM: CHEST - 2 VIEW COMPARISON:  12/05/2016. FINDINGS: Pulmonary vascular congestion with suspected mild interstitial edema. No definite pleural effusions. No pneumothorax. Cardiomegaly. Visualized osseous structures are within normal limits. IMPRESSION: Cardiomegaly with suspected mild interstitial edema. No definite pleural effusions. Electronically Signed   By: Julian Hy M.D.   On: 11/14/2019 23:31    EKG: Independently reviewed.  Sinus rhythm, no acute ischemic changes.  Assessment/Plan Principal  Problem:   Acute exacerbation of CHF (congestive heart failure) (HCC) Active Problems:   Type 2 diabetes mellitus with renal manifestations (HCC)   Acute respiratory failure with hypoxia (HCC)   Hypertensive emergency   Demand ischemia ([place])   Acute hypoxic respiratory failure secondary to volume overload/pulmonary edema in the setting of acutely decompensated CHF, CKD stage V, and  hypertensive emergency Patient with history of CKD stage V but not on dialysis yet.  Oxygen saturation 88 to 90% on room air, now requiring 2 L supplemental oxygen.  BNP significantly elevated at 3544.  Chest x-ray showing cardiomegaly with suspected mild interstitial edema.  Echo done in March 2018 with low normal systolic function (LVEF 50 to 55%), hypokinesis in the inferior myocardium, and grade 1 diastolic dysfunction. Blood pressure was significantly elevated on arrival, this is also likely contributing. -Cardiac monitoring -Patient received IV Lasix 40 mg in the ED. Will hold off ordering additional doses at this time given advanced renal disease/GFR 5.  Continue to monitor renal function closely.  Consult nephrology in a.m. as patient will likely need dialysis. -Monitor intake and output, daily weights, low-sodium diet with fluid restriction -Echocardiogram -Continuous pulse ox, supplemental oxygen as needed to keep oxygen saturation above 92% -Management of hypertensive emergency as mentioned below  Hypertensive emergency Blood pressure significantly elevated with systolic up to 151 upon presentation.  Chest x-ray with evidence of pulmonary edema.  High-sensitivity troponin elevated.  Patient received IV Lasix and nitroglycerin ointment.  Systolic now down to 761Y. -Hydralazine as needed, labetalol as needed for SBP >160 -IV Lasix as above -Monitor blood pressure closely  Demand ischemia Suspect related to hypertensive emergency high-sensitivity troponin 117.  EKG without acute ischemic changes.  Patient  is not endorsing chest pain. -Cardiac monitoring -Trend troponin  CKD stage V Not started on dialysis yet, has a right arm AV fistula in place.  Now presenting with signs and symptoms of volume overload/pulmonary edema.  BUN 80, creatinine 9.1.  Renal function has been declining over time and creatinine in the 7-8 range on recent labs.   -Consult nephrology in a.m.  Normal anion gap metabolic acidosis Bicarb 18, anion gap 15.  Suspect related to advanced renal disease. -Nephrology consultation in a.m. as mentioned above  OSA -Continue CPAP at night  Diet-controlled type 2 diabetes Random blood glucose 119. -Check A1c.  Sliding scale insulin very sensitive ACHS and CBG checks.  Resume home medications after pharmacy med rec is completed.  DVT prophylaxis: Subcutaneous heparin Code Status: Patient wishes to be full code. Family Communication: No family available at this time. Disposition Plan: Anticipate discharge after clinical improvement. Consults called: None Admission status: It is my clinical opinion that admission to INPATIENT is reasonable and necessary in this 79 y.o. male . presenting with Acute hypoxic respiratory failure secondary to volume overload/pulmonary edema in the setting of acutely decompensated CHF, CKD stage V, and hypertensive emergency.   Given the aforementioned, the predictability of an adverse outcome is felt to be significant. I expect that the patient will require at least 2 midnights in the hospital to treat this condition.   The medical decision making on this patient was of high complexity and the patient is at high risk for clinical deterioration, therefore this is a level 3 visit.  Michael Leff MD [facility]  If 7PM-7AM, please contact night-coverage [email or web address] Password [facility]  11/15/2019, 2:58 AM

## 2019-11-15 NOTE — Progress Notes (Signed)
Patient ID: Michael Blackwell, male   DOB: 1941-06-10, 79 y.o.   MRN: [id number] Patient was admitted early this morning for worsening shortness of breath.  He was found to have creatinine of 9.1.  He was given 1 dose of IV Lasix.  I have reviewed his medical records including this morning's H&P, current vitals, labs, medications myself.  I have consulted nephrology/Dr. Jonnie Finner.  We will repeat stat labs.  Might need more intravenous Lasix this morning.

## 2019-11-15 NOTE — Plan of Care (Signed)
  Problem: Education: Goal: Knowledge of General Education information will improve Description Including pain rating scale, medication(s)/side effects and non-pharmacologic comfort measures Outcome: Progressing   Problem: Health Behavior/Discharge Planning: Goal: Ability to manage health-related needs will improve Outcome: Progressing   

## 2019-11-15 NOTE — Consult Note (Signed)
Renal Service Consult Note [name]  Michael Blackwell 11/15/2019 [place] Requesting Physician:  Dr Starla Link  Reason for Consult:  Renal failure HPI: The patient is a 79 y.o. year-old with hx of prostate Ca, OSA, HT, HL, CKD, anemia presented to ED yest on 2/13 w/ SOB x several hours.  Pt had had his 1st COVID vaccination earlier yesterday. In ED RA 88-90 %. Pt has AVF but not started on HD yet.  F/b Dr Marval Regal.  In ED BUN 80, Cr 9.1, prior labs here showed creat 7- 8.  CXR showed IS edema and vasc congestion. Covid was neg. Pt admitted and rec'd IV lasix 40mg  at 1 am and 80mg  at 10 am. BP's high, has rec'd IV labetalol and hydralazine. Asked to see for renal failure.   Pt seen in room, says breathing is "better". No CP or fatigue , no N/V, altered taste or confusion. Has had some swelling of the left ankle he notes. No orthopnea.     ROS  denies CP  no joint pain   no HA  no blurry vision  no rash  no diarrhea  no nausea/ vomiting  no dysuria  no difficulty voiding  no change in urine color    Past Medical History  Past Medical History:  Diagnosis Date  . Acute respiratory failure with hypoxia ([place]) 12/05/2016   [name] 12/05/2016  . Anemia   . Anxiety   . Arthritis    "joints ache at night sometimes" (3/8/20180  . Chronic kidney disease (CKD), stage II (mild)    Acute on chronic kidney disease stage II-III/notes 12/06/2016  . Dyspnea    with exertion  . Heart murmur    never has caused any problems per patient  . History of blood transfusion   . History of radiation therapy 02/25/14- 04/23/14   prostate 7800 cGy 40 sessions, seminal vesicles 5600 cGy 40 sessions  . Hypercholesteremia   . Hypertension   . OSA on CPAP   . Pneumonia 12/06/2016  . Prostate cancer ([place]) 11/19/13   gleason 4+3=7, volume 34.74 cc  . Spermatocele    right  . Type II diabetes mellitus (HCC)    diet controlled  . Wears partial dentures    upper   Past Surgical  History  Past Surgical History:  Procedure Laterality Date  . AV FISTULA PLACEMENT Right 03/04/2019   Procedure: RIGHT ARM ARTERIOVENOUS  FISTULA CREATION;  Surgeon: [name], MD;  Location: [place];  Service: Vascular;  Laterality: Right;  . BASCILIC VEIN TRANSPOSITION Right 05/26/2019   Procedure: Right Arm BASILIC VEIN TRANSPOSITION SECOND STAGE;  Surgeon: [name], MD;  Location: [place];  Service: Vascular;  Laterality: Right;  . COLONOSCOPY    . LOWER EXTREMITY ANGIOGRAPHY Bilateral 02/25/2018   Procedure: Lower Extremity Angiography;  Surgeon: [name], MD;  Location: [place] [facility];  Service: Cardiovascular;  Laterality: Bilateral;  limited  . PROSTATE BIOPSY  11/19/13   Gleason 4+3=7, vol 34.74 cc  . RENAL ANGIOGRAPHY N/A 02/25/2018   Procedure: RENAL ANGIOGRAPHY;  Surgeon: [name], MD;  Location: [place] [facility];  Service: Cardiovascular;  Laterality: N/A;  . TONSILLECTOMY     as child   Family History  Family History  Problem Relation Age of Onset  . Cancer Brother        brain  . Cancer Mother        stomach  . Cancer Father        ?  prostate   Social History  reports that he quit smoking about 16 months ago. His smoking use included cigarettes. He quit after 48.00 years of use. He has never used smokeless tobacco. He reports previous alcohol use. He reports that he does not use drugs. Allergies No Known Allergies Home medications Prior to Admission medications   Medication Sig Start Date End Date Taking? Authorizing Provider  amLODipine (NORVASC) 10 MG tablet Take 1 tablet by mouth once daily Patient taking differently: Take 5 mg by mouth daily.  10/01/19  Yes [name], NP  aspirin 81 MG tablet Take 81 mg by mouth at bedtime.    Yes [provider]  bismuth subsalicylate (PEPTO BISMOL) 262 MG/15ML suspension Take 30 mLs by mouth every 6 (six) hours as needed for indigestion.   Yes [provider]  calcitRIOL (ROCALTROL) 0.25 MCG capsule Take 0.25 mcg by mouth daily. 12/02/18  Yes [provider]  Calcium Carb-Cholecalciferol (CALCIUM 600+D) 600-800 MG-UNIT TABS Take 1 tablet by mouth 2 (two) times a day.   Yes [provider]  carvedilol (COREG) 25 MG tablet Take 25 mg by mouth 2 (two) times daily.  12/31/18  Yes [provider]  cholecalciferol (VITAMIN D3) 25 MCG (1000 UT) tablet Take 1,000 Units by mouth daily.   Yes [provider]  cloNIDine (CATAPRES) 0.3 MG tablet Take 0.3 mg by mouth 2 (two) times daily.  12/12/15  Yes [provider]  Ferrous Sulfate (IRON) 325 (65 Fe) MG TABS Take 1 tablet by mouth twice daily Patient taking differently: Take 325 mg by mouth in the morning and at bedtime.  09/08/19  Yes [name], NP  furosemide (LASIX) 40 MG tablet Take 1.5 tablets (60 mg total) by mouth 2 (two) times daily. 12/10/16  Yes Dhungel, Nishant, MD  hydrALAZINE (APRESOLINE) 100 MG tablet Take 100 mg by mouth 3 (three) times daily.    Yes [provider]  Multiple Vitamins-Minerals (CENTRUM SILVER 50+MEN) TABS Take 1 tablet by mouth daily.   Yes [provider]  Omega-3 Fatty Acids (OMEGA 3 PO) Take 1,040 mg by mouth 2 (two) times daily.    Yes [provider]  phenylephrine (NEO-SYNEPHRINE COLD & SINUS) 1 % nasal spray Place 1 drop into both nostrils at bedtime as needed for congestion.    Yes [provider]  potassium chloride SA (K-DUR,KLOR-CON) 20 MEQ tablet Take 1 tablet (20 mEq total) by mouth daily. 12/10/16  Yes Dhungel, Nishant, MD  rosuvastatin (CRESTOR) 10 MG tablet Take 1 tablet by mouth once daily Patient taking differently: Take 10 mg by mouth daily.  08/26/19  Yes [name], NP  sodium bicarbonate 650 MG tablet Take 650 mg by mouth 2 (two) times daily.   Yes [provider]  tamsulosin (FLOMAX) 0.4 MG CAPS capsule Take 1 capsule (0.4 mg total) by mouth  daily. Patient taking differently: Take 0.4 mg by mouth every other day.  04/12/14  Yes [name], MD     Vitals:   11/15/19 1358 11/15/19 1555 11/15/19 1600 11/15/19 1641  BP: (!) 183/93 (!) 208/91 (!) 208/91 (!) 215/96  Pulse: 81 81 84 88  Resp: (!) 31 20 (!) 23   Temp:  98.3 F (36.8 C)    TempSrc:  Oral    SpO2: 97% 99% 98% 95%  Weight:      Height:       Exam Gen alert, in good spirits No rash, cyanosis or gangrene  Sclera anicteric, throat clear No jvd or bruits Chest bilat basilar rales, L > R, 1/3 up RRR no MRG Abd soft ntnd no mass or ascites +bs GU normal male MS no joint effusions or deformity Ext 1+ L ankle/ pedal edema, no wounds or ulcers Neuro is alert, Ox 3 , nf RU AAVF + bruit     CXR 2/13 > IMPRESSION: cardiomegaly with suspected mild interstitial edema. No definite pleural effusions.   Old renal US Feb 2020 > bilat echogenic kidneys, no hydro, 10-11 cm   UA pend    UNa pend   , UCr pend     ECHO 2/14 - results pend   Assessment/ Plan: 1. Pulm edema/SOB - due to vol overload in CKD V patient not on HD yet. Cont IV lasix tid, he is improving. No signs of ischemia or CP.  Not in distress. No uremic symptoms, no indication for HD at this time. When pulm edema resolved should be ok for dc home. Will repeat CXR in am. Strict I/O's. Fluid restriction.  2. CKD V - f/b CKA, has R arm AVG 3. HTN - cont home meds, diurese      Rob Jonnie Finner  MD 11/15/2019, 5:52 PM  Recent Labs  Lab 11/14/19 2229 11/15/19 0822  WBC 7.1 6.0  HGB 14.8 13.2   Recent Labs  Lab 11/14/19 2229 11/15/19 0822  K 4.3 4.4  BUN 80* 80*  CREATININE 9.15* 9.39*  CALCIUM 9.2 8.8*

## 2019-11-16 ENCOUNTER — Inpatient Hospital Stay (HOSPITAL_COMMUNITY): Payer: Medicare Other

## 2019-11-16 DIAGNOSIS — I161 Hypertensive emergency: Secondary | ICD-10-CM

## 2019-11-16 DIAGNOSIS — E8770 Fluid overload, unspecified: Secondary | ICD-10-CM

## 2019-11-16 DIAGNOSIS — N185 Chronic kidney disease, stage 5: Secondary | ICD-10-CM

## 2019-11-16 DIAGNOSIS — R0902 Hypoxemia: Secondary | ICD-10-CM

## 2019-11-16 LAB — CBC WITH DIFFERENTIAL/PLATELET
Abs Immature Granulocytes: 0.01 10*3/uL (ref 0.00–0.07)
Basophils Absolute: 0 10*3/uL (ref 0.0–0.1)
Basophils Relative: 1 %
Eosinophils Absolute: 0.1 10*3/uL (ref 0.0–0.5)
Eosinophils Relative: 1 %
HCT: 41.8 % (ref 39.0–52.0)
Hemoglobin: 13.3 g/dL (ref 13.0–17.0)
Immature Granulocytes: 0 %
Lymphocytes Relative: 17 %
Lymphs Abs: 1 10*3/uL (ref 0.7–4.0)
MCH: 27.6 pg (ref 26.0–34.0)
MCHC: 31.8 g/dL (ref 30.0–36.0)
MCV: 86.7 fL (ref 80.0–100.0)
Monocytes Absolute: 0.6 10*3/uL (ref 0.1–1.0)
Monocytes Relative: 10 %
Neutro Abs: 4.2 10*3/uL (ref 1.7–7.7)
Neutrophils Relative %: 71 %
Platelets: 228 10*3/uL (ref 150–400)
RBC: 4.82 MIL/uL (ref 4.22–5.81)
RDW: 18 % — ABNORMAL HIGH (ref 11.5–15.5)
WBC: 5.9 10*3/uL (ref 4.0–10.5)
nRBC: 0 % (ref 0.0–0.2)

## 2019-11-16 LAB — MAGNESIUM: Magnesium: 2.2 mg/dL (ref 1.7–2.4)

## 2019-11-16 LAB — ECHOCARDIOGRAM COMPLETE
Height: 68 in
Weight: 2832 oz

## 2019-11-16 LAB — BASIC METABOLIC PANEL
Anion gap: 15 (ref 5–15)
BUN: 78 mg/dL — ABNORMAL HIGH (ref 8–23)
CO2: 16 mmol/L — ABNORMAL LOW (ref 22–32)
Calcium: 8.9 mg/dL (ref 8.9–10.3)
Chloride: 109 mmol/L (ref 98–111)
Creatinine, Ser: 9.66 mg/dL — ABNORMAL HIGH (ref 0.61–1.24)
GFR calc Af Amer: 5 mL/min — ABNORMAL LOW (ref >60)
GFR calc non Af Amer: 5 mL/min — ABNORMAL LOW (ref >60)
Glucose, Bld: 128 mg/dL — ABNORMAL HIGH (ref 70–99)
Potassium: 4.2 mmol/L (ref 3.5–5.1)
Sodium: 140 mmol/L (ref 135–145)

## 2019-11-16 LAB — GLUCOSE, CAPILLARY: Glucose-Capillary: 114 mg/dL — ABNORMAL HIGH (ref 70–99)

## 2019-11-16 MED ORDER — AMLODIPINE BESYLATE 10 MG PO TABS
10.0000 mg | ORAL_TABLET | Freq: Every day | ORAL | Status: DC
  Administered 2019-11-16: 10 mg via ORAL
  Filled 2019-11-16: qty 1

## 2019-11-16 MED ORDER — TAMSULOSIN HCL 0.4 MG PO CAPS: 0.4000 mg | ORAL_CAPSULE | ORAL | Status: AC

## 2019-11-16 MED ORDER — FUROSEMIDE 80 MG PO TABS: 160.0000 mg | ORAL_TABLET | Freq: Two times a day (BID) | ORAL | 0 refills | Status: DC

## 2019-11-16 NOTE — Progress Notes (Signed)
Patients BP 210/97 MD notified orders received will monitor

## 2019-11-16 NOTE — Discharge Summary (Signed)
Physician Discharge Summary  Michael Blackwell GYI:[id number] DOB: [birth year] DOA: 11/14/2019  PCP: Rogers Blocker, MD  Admit date: 11/14/2019 Discharge date: 11/16/2019  Admitted From: Home Disposition: Home  Recommendations for Outpatient Follow-up:  1. Follow up with PCP in 1 week with repeat CBC/BMP 2. 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  On presentation, he required 2 L oxygen via nasal cannula; chest x-ray showed cardiomegaly with suspected mild interstitial edema.  COVID-19 test was negative.  Creatinine was 9.1.  he was given intravenous Lasix.  Nephrology was consulted.  Patient is having good urine output but his creatinine is worsening.  Patient is adamant regarding going home.  Nephrology has cleared the patient for discharge with close nephrology follow-up at earliest convenience.  Discharge Diagnoses:  Pulmonary edema/volume overload Chronic any disease stage V Acute metabolic acidosis Hypoxia -Patient presented with hypoxia and required supplemental oxygen via nasal cannula 2 L/min.  Chest x-ray showed cardiomegaly with suspected mild interstitial edema on presentation.  COVID-19 test was negative -Creatinine was 9.1 on presentation. -Started on intravenous Lasix.  Nephrology following and dosing with diuretics.  Still making urine.  Negative balance of 1352 cc since admission. -Creatinine 9.66 today. -Currently on room air. -Patient will need to continue with fluid restriction and compliant with diet.  Patient is very adamant regarding going home today.   Spoke to Dr. Coladonato/nephrology who states that he will arrange for close outpatient follow-up this week and recommends him to be discharged on Lasix 160 mg twice a day.  Hypertensive emergency -Presented with extremely elevated blood pressure with pulmonary edema.  Blood pressure still extremely elevated but slightly improving.    Lasix plan as above.  Continue rest of the home regimen.  Outpatient follow-up  Mildly positive troponins -Most likely secondary to above.  No chest pains.  Outpatient follow-up with cardiology.  EKG without any ischemic changes  OSA -Continue CPAP at night  Diabetes mellitus type 2, diet controlled -Continue CBGs with SSI.  A1c 6.1.  Outpatient follow-up.  Carb modified diet   Discharge Instructions  Discharge Instructions    Diet - low sodium heart healthy   Complete by: As directed    Increase activity slowly   Complete by: As directed      Allergies as of 11/16/2019   No Known Allergies     Medication List    STOP taking these medications   potassium chloride SA 20 MEQ tablet Commonly known as: KLOR-CON     TAKE these medications   amLODipine 10 MG tablet Commonly known as: NORVASC Take 1 tablet by mouth once daily What changed: how much to take   aspirin 81 MG tablet Take 81 mg by mouth at bedtime.   bismuth subsalicylate 350 KX/38HW suspension Commonly known as: PEPTO BISMOL Take 30 mLs by mouth every 6 (six) hours as needed for indigestion.   calcitRIOL 0.25 MCG capsule Commonly known as: ROCALTROL Take 0.25 mcg by mouth daily.   Calcium 600+D 600-800 MG-UNIT Tabs Generic drug: Calcium Carb-Cholecalciferol Take 1 tablet by mouth 2 (two) times a day.   carvedilol 25 MG tablet Commonly known  as: COREG Take 25 mg by mouth 2 (two) times daily.   Centrum Silver 50+Men Tabs Take 1 tablet by mouth daily.   cholecalciferol 25 MCG (1000 UNIT) tablet Commonly known as: VITAMIN D3 Take 1,000 Units by mouth daily.    cloNIDine 0.3 MG tablet Commonly known as: CATAPRES Take 0.3 mg by mouth 2 (two) times daily.   furosemide 80 MG tablet Commonly known as: Lasix Take 2 tablets (160 mg total) by mouth 2 (two) times daily. What changed:   medication strength  how much to take   hydrALAZINE 100 MG tablet Commonly known as: APRESOLINE Take 100 mg by mouth 3 (three) times daily.   Iron 325 (65 Fe) MG Tabs Take 1 tablet by mouth twice daily What changed: when to take this   Neo-Synephrine Cold & Sinus 1 % nasal spray Generic drug: phenylephrine Place 1 drop into both nostrils at bedtime as needed for congestion.   OMEGA 3 PO Take 1,040 mg by mouth 2 (two) times daily.   rosuvastatin 10 MG tablet Commonly known as: CRESTOR Take 1 tablet by mouth once daily   sodium bicarbonate 650 MG tablet Take 650 mg by mouth 2 (two) times daily.   tamsulosin 0.4 MG Caps capsule Commonly known as: FLOMAX Take 1 capsule (0.4 mg total) by mouth every other day.      Follow-up Information    Rogers Blocker, MD. Schedule an appointment as soon as possible for a visit in 1 week(s).   Specialty: Internal Medicine Contact information: [address] [place] [address] [phone]        Donato Heinz, MD Follow up.   Specialty: Nephrology Why: at earliest convenience  Contact information: [address] [place] [address] [phone]          No Known Allergies  Consultations:  Nephrology   Procedures/Studies: DG Chest 2 View  Result Date: 11/16/2019 CLINICAL DATA:  Pulmonary edema. EXAM: CHEST - 2 VIEW COMPARISON:  01/12/2020 FINDINGS: There is persistent mild cardiomegaly. There is persistent accentuation of the interstitial markings bilaterally with peribronchial thickening. No pleural effusions. No significant bone abnormality. IMPRESSION: Persistent mild cardiomegaly. Persistent accentuation of the interstitial markings with peribronchial thickening consistent with  mild pulmonary edema. Electronically Signed   By: Lorriane Shire M.D.   On: 11/16/2019 07:58   DG Chest 2 View  Result Date: 11/14/2019 CLINICAL DATA:  Difficulty breathing. On dialysis. Status post COVID vaccine today. EXAM: CHEST - 2 VIEW COMPARISON:  12/05/2016. FINDINGS: Pulmonary vascular congestion with suspected mild interstitial edema. No definite pleural effusions. No pneumothorax. Cardiomegaly. Visualized osseous structures are within normal limits. IMPRESSION: Cardiomegaly with suspected mild interstitial edema. No definite pleural effusions. Electronically Signed   By: Julian Hy M.D.   On: 11/14/2019 23:31   ECHOCARDIOGRAM COMPLETE  Result Date: 11/16/2019    ECHOCARDIOGRAM REPORT   Patient Name:   Michael Blackwell Date of Exam: 11/15/2019 Medical Rec #:  [id number]       Height:       68.0 in Accession #:    [id number]      Weight:       177.0 lb Date of Birth:  Jun 02, 1941       BSA:          1.94 m Patient Age:    47 years        BP:           172/82 mmHg Patient Gender: M  HR:           81 bpm. Exam Location:  Inpatient Procedure: 2D Echo Indications:     acute systolic chf 841.32  History:         Patient has prior history of Echocardiogram examinations, most                  recent 12/06/2016. CHF, chronic kidney disease; Risk                  Factors:Hypertension, Dyslipidemia and Diabetes.  Sonographer:     Johny Chess RDCS Referring Phys:  [id number] Aline August Diagnosing Phys: Adrian Prows MD IMPRESSIONS  1. Left ventricular ejection fraction, by estimation, is 30 to 35%. The left ventricle has moderately decreased function. The left ventricle demonstrates global hypokinesis. There is mild left ventricular hypertrophy. Left ventricular diastolic parameters are consistent with Grade I diastolic dysfunction (impaired relaxation). There is mild hypokinesis of the left ventricular, entire inferolateral wall and inferoseptal wall.  2. Right ventricular systolic function is  normal. The right ventricular size is normal.  3. Left atrial size was severely dilated.  4. The mitral valve is normal in structure and function. Trivial mitral valve regurgitation.  5. The aortic valve is tricuspid. Aortic valve regurgitation is not visualized. No aortic stenosis is present.  6. The inferior vena cava is normal in size with greater than 50% respiratory variability, suggesting right atrial pressure of 3 mmHg. FINDINGS  Left Ventricle: Left ventricular ejection fraction, by estimation, is 30 to 35%. The left ventricle has moderately decreased function. The left ventricle demonstrates global hypokinesis. Mild hypokinesis of the left ventricular, entire inferolateral wall and inferoseptal wall. The left ventricular internal cavity size was normal in size. There is mild left ventricular hypertrophy. Left ventricular diastolic parameters are consistent with Grade I diastolic dysfunction (impaired relaxation). Right Ventricle: The right ventricular size is normal. No increase in right ventricular wall thickness. Right ventricular systolic function is normal. Left Atrium: Left atrial size was severely dilated. Right Atrium: Right atrial size was normal in size. Pericardium: A small pericardial effusion is present. Mitral Valve: The mitral valve is normal in structure and function. There is mild thickening of the mitral valve leaflet(s). Mild mitral annular calcification. Trivial mitral valve regurgitation. Tricuspid Valve: The tricuspid valve is normal in structure. Tricuspid valve regurgitation is trivial. Aortic Valve: The aortic valve is tricuspid. . There is mild thickening of the aortic valve. Aortic valve regurgitation is not visualized. No aortic stenosis is present. There is mild thickening of the aortic valve. Pulmonic Valve: The pulmonic valve was normal in structure. Pulmonic valve regurgitation is not visualized. Aorta: The aortic root is normal in size and structure. Venous: The inferior vena  cava is normal in size with greater than 50% respiratory variability, suggesting right atrial pressure of 3 mmHg. IAS/Shunts: The atrial septum is grossly normal. Additional Comments: There is no pleural effusion.  LEFT VENTRICLE PLAX 2D LVIDd:         5.80 cm  Diastology LVIDs:         4.60 cm  LV e' lateral:   5.55 cm/s LV PW:         1.10 cm  LV E/e' lateral: 19.1 LV IVS:        1.00 cm  LV e' medial:    4.13 cm/s LVOT diam:     2.40 cm  LV E/e' medial:  25.7 LV SV:  84.60 ml LV SV Index:   35.00 LVOT Area:     4.52 cm  RIGHT VENTRICLE RV S prime:     16.00 cm/s TAPSE (M-mode): 1.9 cm LEFT ATRIUM              Index       RIGHT ATRIUM           Index LA diam:        4.60 cm  2.37 cm/m  RA Area:     17.00 cm LA Vol (A2C):   98.6 ml  50.82 ml/m RA Volume:   45.50 ml  23.45 ml/m LA Vol (A4C):   103.0 ml 53.08 ml/m LA Biplane Vol: 105.0 ml 54.11 ml/m  AORTIC VALVE LVOT Vmax:   96.00 cm/s LVOT Vmean:  54.400 cm/s LVOT VTI:    0.187 m  AORTA Ao Root diam: 3.20 cm MITRAL VALVE MV Area (PHT): 5.97 cm     SHUNTS MV Decel Time: 127 msec     Systemic VTI:  0.19 m MV E velocity: 106.00 cm/s  Systemic Diam: 2.40 cm MV A velocity: 153.00 cm/s MV E/A ratio:  0.69 Adrian Prows MD Electronically signed by Adrian Prows MD Signature Date/Time: 11/16/2019/10:29:32 AM    Final        Subjective: Patient seen and examined at bedside.  He feels slightly better.  States that he is making urine.  Denies current chest pain, worsening shortness of breath, nausea, vomiting, fever.    He wants to go home  Discharge Exam: Vitals:   11/16/19 0914 11/16/19 1115  BP: (!) 206/98 (!) 198/96  Pulse:    Resp: 20 18  Temp: 98 F (36.7 C)   SpO2: 98%     General exam: Appears calm and comfortable.  No distress Respiratory system: Bilateral decreased breath sounds at bases with basilar crackles.  Intermittent tachypnea Cardiovascular system: S1 & S2 heard, Rate controlled Gastrointestinal system: Abdomen is nondistended,  soft and nontender. Normal bowel sounds heard. Extremities: No cyanosis, clubbing; trace lower extremity edema   The results of significant diagnostics from this hospitalization (including imaging, microbiology, ancillary and laboratory) are listed below for reference.     Microbiology: Recent Results (from the past 240 hour(s))  SARS CORONAVIRUS 2 (TAT 6-24 HRS) Nasopharyngeal Nasopharyngeal Swab     Status: None   Collection Time: 11/15/19  1:38 AM   Specimen: Nasopharyngeal Swab  Result Value Ref Range Status   SARS Coronavirus 2 NEGATIVE NEGATIVE Final    Comment: (NOTE) SARS-CoV-2 target nucleic acids are NOT DETECTED. The SARS-CoV-2 RNA is generally detectable in upper and lower respiratory specimens during the acute phase of infection. Negative results do not preclude SARS-CoV-2 infection, do not rule out co-infections with other pathogens, and should not be used as the sole basis for treatment or other patient management decisions. Negative results must be combined with clinical observations, patient history, and epidemiological information. The expected result is Negative. Fact Sheet for Patients: SugarRoll.be Fact Sheet for Healthcare Providers: https://www.woods-mathews.com/ This test is not yet approved or cleared by the [place] FDA and  has been authorized for detection and/or diagnosis of SARS-CoV-2 by FDA under an Emergency Use Authorization (EUA). This EUA will remain  in effect (meaning this test can be used) for the duration of the COVID-19 declaration under Section 56 4(b)(1) of the Act, 21 U.S.C. section 360bbb-3(b)(1), unless the authorization is terminated or revoked sooner. Performed at [facility], [place],  [place] [address]   Respiratory Panel by RT PCR (Flu A&B, Covid) - Nasopharyngeal Swab     Status: None   Collection Time: 11/15/19  3:09 AM   Specimen: Nasopharyngeal Swab  Result  Value Ref Range Status   SARS Coronavirus 2 by RT PCR NEGATIVE NEGATIVE Final    Comment: (NOTE) SARS-CoV-2 target nucleic acids are NOT DETECTED. The SARS-CoV-2 RNA is generally detectable in upper respiratoy specimens during the acute phase of infection. The lowest concentration of SARS-CoV-2 viral copies this assay can detect is 131 copies/mL. A negative result does not preclude SARS-Cov-2 infection and should not be used as the sole basis for treatment or other patient management decisions. A negative result may occur with  improper specimen collection/handling, submission of specimen other than nasopharyngeal swab, presence of viral mutation(s) within the areas targeted by this assay, and inadequate number of viral copies (<131 copies/mL). A negative result must be combined with clinical observations, patient history, and epidemiological information. The expected result is Negative. Fact Sheet for Patients:  PinkCheek.be Fact Sheet for Healthcare Providers:  GravelBags.it This test is not yet ap proved or cleared by the [place] FDA and  has been authorized for detection and/or diagnosis of SARS-CoV-2 by FDA under an Emergency Use Authorization (EUA). This EUA will remain  in effect (meaning this test can be used) for the duration of the COVID-19 declaration under Section 564(b)(1) of the Act, 21 U.S.C. section 360bbb-3(b)(1), unless the authorization is terminated or revoked sooner.    Influenza A by PCR NEGATIVE NEGATIVE Final   Influenza B by PCR NEGATIVE NEGATIVE Final    Comment: (NOTE) The Xpert Xpress SARS-CoV-2/FLU/RSV assay is intended as an aid in  the diagnosis of influenza from Nasopharyngeal swab specimens and  should not be used as a sole basis for treatment. Nasal washings and  aspirates are unacceptable for Xpert Xpress SARS-CoV-2/FLU/RSV  testing. Fact Sheet for  Patients: PinkCheek.be Fact Sheet for Healthcare Providers: GravelBags.it This test is not yet approved or cleared by the [place] FDA and  has been authorized for detection and/or diagnosis of SARS-CoV-2 by  FDA under an Emergency Use Authorization (EUA). This EUA will remain  in effect (meaning this test can be used) for the duration of the  Covid-19 declaration under Section 564(b)(1) of the Act, 21  U.S.C. section 360bbb-3(b)(1), unless the authorization is  terminated or revoked. Performed at [facility], [place] [address], [place], [place] [address]      Labs: BNP (last 3 results) Recent Labs    11/14/19 2312  BNP 1,017.5*   Basic Metabolic Panel: Recent Labs  Lab 11/14/19 2229 11/15/19 0822 11/16/19 0252  NA 140 139 140  K 4.3 4.4 4.2  CL 107 108 109  CO2 18* 16* 16*  GLUCOSE 119* 99 128*  BUN 80* 80* 78*  CREATININE 9.15* 9.39* 9.66*  CALCIUM 9.2 8.8* 8.9  MG  --  2.2 2.2   Liver Function Tests: No results for input(s): AST, ALT, ALKPHOS, BILITOT, PROT, ALBUMIN in the last 168 hours. No results for input(s): LIPASE, AMYLASE in the last 168 hours. No results for input(s): AMMONIA in the last 168 hours. CBC: Recent Labs  Lab 11/14/19 2229 11/15/19 0822 11/16/19 0252  WBC 7.1 6.0 5.9  NEUTROABS 4.9 4.3 4.2  HGB 14.8 13.2 13.3  HCT 46.9 40.9 41.8  MCV 88.0 86.7 86.7  PLT 254 197 228   Cardiac Enzymes: No results for input(s): CKTOTAL, CKMB, CKMBINDEX, TROPONINI  in the last 168 hours. BNP: Invalid input(s): POCBNP CBG: Recent Labs  Lab 11/15/19 0556 11/15/19 1222 11/15/19 1655 11/15/19 2055 11/16/19 0603  GLUCAP 112* 134* 115* 130* 114*   D-Dimer No results for input(s): DDIMER in the last 72 hours. Hgb A1c Recent Labs    11/15/19 0309  HGBA1C 6.1*   Lipid Profile No results for input(s): CHOL, HDL, LDLCALC, TRIG, CHOLHDL, LDLDIRECT in the last 72 hours. Thyroid  function studies No results for input(s): TSH, T4TOTAL, T3FREE, THYROIDAB in the last 72 hours.  Invalid input(s): FREET3 Anemia work up No results for input(s): VITAMINB12, FOLATE, FERRITIN, TIBC, IRON, RETICCTPCT in the last 72 hours. Urinalysis No results found for: COLORURINE, APPEARANCEUR, [place], [place], [place], [place], [place], [place], PROTEINUR, UROBILINOGEN, NITRITE, LEUKOCYTESUR Sepsis Labs Invalid input(s): PROCALCITONIN,  WBC,  LACTICIDVEN Microbiology Recent Results (from the past 240 hour(s))  SARS CORONAVIRUS 2 (TAT 6-24 HRS) Nasopharyngeal Nasopharyngeal Swab     Status: None   Collection Time: 11/15/19  1:38 AM   Specimen: Nasopharyngeal Swab  Result Value Ref Range Status   SARS Coronavirus 2 NEGATIVE NEGATIVE Final    Comment: (NOTE) SARS-CoV-2 target nucleic acids are NOT DETECTED. The SARS-CoV-2 RNA is generally detectable in upper and lower respiratory specimens during the acute phase of infection. Negative results do not preclude SARS-CoV-2 infection, do not rule out co-infections with other pathogens, and should not be used as the sole basis for treatment or other patient management decisions. Negative results must be combined with clinical observations, patient history, and epidemiological information. The expected result is Negative. Fact Sheet for Patients: SugarRoll.be Fact Sheet for Healthcare Providers: https://www.woods-mathews.com/ This test is not yet approved or cleared by the [place] FDA and  has been authorized for detection and/or diagnosis of SARS-CoV-2 by FDA under an Emergency Use Authorization (EUA). This EUA will remain  in effect (meaning this test can be used) for the duration of the COVID-19 declaration under Section 56 4(b)(1) of the Act, 21 U.S.C. section 360bbb-3(b)(1), unless the authorization is terminated or revoked sooner. Performed at [facility], [place] [address], [place], [place] [address]   Respiratory Panel by RT PCR (Flu A&B, Covid) - Nasopharyngeal Swab     Status: None   Collection Time: 11/15/19  3:09 AM   Specimen: Nasopharyngeal Swab  Result Value Ref Range Status   SARS Coronavirus 2 by RT PCR NEGATIVE NEGATIVE Final    Comment: (NOTE) SARS-CoV-2 target nucleic acids are NOT DETECTED. The SARS-CoV-2 RNA is generally detectable in upper respiratoy specimens during the acute phase of infection. The lowest concentration of SARS-CoV-2 viral copies this assay can detect is 131 copies/mL. A negative result does not preclude SARS-Cov-2 infection and should not be used as the sole basis for treatment or other patient management decisions. A negative result may occur with  improper specimen collection/handling, submission of specimen other than nasopharyngeal swab, presence of viral mutation(s) within the areas targeted by this assay, and inadequate number of viral copies (<131 copies/mL). A negative result must be combined with clinical observations, patient history, and epidemiological information. The expected result is Negative. Fact Sheet for Patients:  PinkCheek.be Fact Sheet for Healthcare Providers:  GravelBags.it This test is not yet ap proved or cleared by the [place] FDA and  has been authorized for detection and/or diagnosis of SARS-CoV-2 by FDA under an Emergency Use Authorization (EUA). This EUA will remain  in effect (meaning this test can be used) for the duration of the  COVID-19 declaration under Section 564(b)(1) of the Act, 21 U.S.C. section 360bbb-3(b)(1), unless the authorization is terminated or revoked sooner.    Influenza A by PCR NEGATIVE NEGATIVE Final   Influenza B by PCR NEGATIVE NEGATIVE Final    Comment: (NOTE) The Xpert Xpress SARS-CoV-2/FLU/RSV assay is intended as an aid in  the diagnosis of influenza from Nasopharyngeal swab specimens and   should not be used as a sole basis for treatment. Nasal washings and  aspirates are unacceptable for Xpert Xpress SARS-CoV-2/FLU/RSV  testing. Fact Sheet for Patients: PinkCheek.be Fact Sheet for Healthcare Providers: GravelBags.it This test is not yet approved or cleared by the [place] FDA and  has been authorized for detection and/or diagnosis of SARS-CoV-2 by  FDA under an Emergency Use Authorization (EUA). This EUA will remain  in effect (meaning this test can be used) for the duration of the  Covid-19 declaration under Section 564(b)(1) of the Act, 21  U.S.C. section 360bbb-3(b)(1), unless the authorization is  terminated or revoked. Performed at [facility], [place] [address] [place], [place] [address]      Time coordinating discharge: 35 minutes  SIGNED:   Aline August, MD  [facility] 11/16/2019, 1:24 PM

## 2019-11-16 NOTE — Progress Notes (Signed)
Patient's BP 199/96 paging MD awaiting reply

## 2019-11-16 NOTE — Progress Notes (Signed)
Patient ID: Michael Blackwell, male   DOB: 1941/06/02, 79 y.o.   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  On presentation, he required 2 L oxygen via nasal cannula; chest x-ray showed cardiomegaly with suspected mild interstitial edema.  COVID-19 test was negative.  Creatinine was 9.1.  he was given intravenous Lasix.  Nephrology was consulted.  Assessment & Plan:   Pulmonary edema/volume overload Chronic any disease stage V Acute metabolic acidosis Hypoxia -Patient presented with hypoxia and required supplemental oxygen via nasal cannula 2 L/min.  Chest x-ray showed cardiomegaly with suspected mild interstitial edema on presentation.  COVID-19 test was negative -Creatinine was 9.1 on presentation. -Started on intravenous Lasix.  Nephrology following and dosing with diuretics.  Still making urine.  Negative balance of 1352 cc since admission. -Creatinine 9.66 today. -Currently on room air. -Strict input and output.  Daily weights.  Fluid restriction. -Follow further nephrology recommendations.  Monitor creatinine  Hypertensive emergency -Presented with extremely elevated blood pressure with pulmonary edema.  Blood pressure still extremely elevated but slightly improving.  Continue Lasix, Coreg and hydralazine.  We will add amlodipine as well.  Mildly positive troponins -Most likely secondary to above.  No chest pains.  Outpatient follow-up with cardiology.  EKG without any ischemic changes  OSA -Continue CPAP at night  Diabetes mellitus type 2, diet controlled -Continue CBGs with SSI.  A1c 6.1.  Outpatient follow-up.  Carb modified  diet   DVT prophylaxis: Heparin Code Status: Full Family Communication: Spoke to patient at bedside Disposition Plan: Creatinine is trending up.  Currently still requiring IV Lasix.  Might need 1 or 2 more days in the hospital.  Will need nephrology clearance prior to discharge Consultants: Nephrology  Procedures: Echo pending  Antimicrobials:  None  Subjective: Patient seen and examined at bedside.  He feels slightly better.  States that he is making urine.  Denies current chest pain, worsening shortness of breath, nausea, vomiting, fever.  Still short of breath with exertion.  Objective: Vitals:   11/16/19 0047 11/16/19 0443 11/16/19 0615 11/16/19 0914  BP: (!) 166/96 (!) 196/103 (!) 199/96 (!) 206/98  Pulse: 90 83 75   Resp: (!) 31 15 (!) 33 20  Temp: 98.1 F (36.7 C) 97.9 F (36.6 C)  98 F (36.7 C)  TempSrc: Oral Oral  Oral  SpO2: 96% 98% 100% 98%  Weight:  78.3 kg    Height:        Intake/Output Summary (Last 24 hours) at 11/16/2019 0948 Last data filed at 11/16/2019 0830 Gross per 24 hour  Intake 323 ml  Output 1675 ml  Net -1352 ml   Filed Weights   11/15/19 0600 11/16/19 0443  Weight: 80.3 kg 78.3 kg    Examination:  General exam: Appears calm and comfortable.  No distress Respiratory system: Bilateral decreased breath sounds at bases with basilar crackles.  Intermittent tachypnea Cardiovascular system: S1 & S2 heard, Rate controlled Gastrointestinal system: Abdomen is nondistended, soft and nontender. Normal bowel sounds heard. Extremities: No cyanosis, clubbing; trace lower extremity edema Central nervous system: Alert and oriented. No focal neurological deficits. Moving extremities Skin: No rashes, lesions or ulcers  Psychiatry: Judgement and insight appear normal. Mood & affect appropriate.     Data Reviewed: I have personally reviewed following labs and imaging studies  CBC: Recent Labs  Lab 11/14/19 2229 11/15/19 0822 11/16/19 0252  WBC  7.1 6.0 5.9  NEUTROABS 4.9 4.3 4.2  HGB 14.8 13.2 13.3  HCT 46.9 40.9 41.8  MCV 88.0 86.7 86.7  PLT 254 197 409   Basic Metabolic Panel: Recent Labs  Lab 11/14/19 2229 11/15/19 0822 11/16/19 0252  NA 140 139 140  K 4.3 4.4 4.2  CL 107 108 109  CO2 18* 16* 16*  GLUCOSE 119* 99 128*  BUN 80* 80* 78*  CREATININE 9.15* 9.39* 9.66*  CALCIUM 9.2 8.8* 8.9  MG  --  2.2 2.2   GFR: Estimated Creatinine Clearance: 6.3 mL/min (A) (by C-G formula based on SCr of 9.66 mg/dL (H)). Liver Function Tests: No results for input(s): AST, ALT, ALKPHOS, BILITOT, PROT, ALBUMIN in the last 168 hours. No results for input(s): LIPASE, AMYLASE in the last 168 hours. No results for input(s): AMMONIA in the last 168 hours. Coagulation Profile: No results for input(s): INR, PROTIME in the last 168 hours. Cardiac Enzymes: No results for input(s): CKTOTAL, CKMB, CKMBINDEX, TROPONINI in the last 168 hours. BNP (last 3 results) No results for input(s): PROBNP in the last 8760 hours. HbA1C: Recent Labs    11/15/19 0309  HGBA1C 6.1*   CBG: Recent Labs  Lab 11/15/19 0556 11/15/19 1222 11/15/19 1655 11/15/19 2055 11/16/19 0603  GLUCAP 112* 134* 115* 130* 114*   Lipid Profile: No results for input(s): CHOL, HDL, LDLCALC, TRIG, CHOLHDL, LDLDIRECT in the last 72 hours. Thyroid Function Tests: No results for input(s): TSH, T4TOTAL, FREET4, T3FREE, THYROIDAB in the last 72 hours. Anemia Panel: No results for input(s): VITAMINB12, FOLATE, FERRITIN, TIBC, IRON, RETICCTPCT in the last 72 hours. Sepsis Labs: No results for input(s): PROCALCITON, LATICACIDVEN in the last 168 hours.  Recent Results (from the past 240 hour(s))  SARS CORONAVIRUS 2 (TAT 6-24 HRS) Nasopharyngeal Nasopharyngeal Swab     Status: None   Collection Time: 11/15/19  1:38 AM   Specimen: Nasopharyngeal Swab  Result Value Ref Range Status   SARS Coronavirus 2 NEGATIVE NEGATIVE Final    Comment: (NOTE) SARS-CoV-2 target nucleic  acids are NOT DETECTED. The SARS-CoV-2 RNA is generally detectable in upper and lower respiratory specimens during the acute phase of infection. Negative results do not preclude SARS-CoV-2 infection, do not rule out co-infections with other pathogens, and should not be used as the sole basis for treatment or other patient management decisions. Negative results must be combined with clinical observations, patient history, and epidemiological information. The expected result is Negative. Fact Sheet for Patients: SugarRoll.be Fact Sheet for Healthcare Providers: https://www.woods-mathews.com/ This test is not yet approved or cleared by the [place] FDA and  has been authorized for detection and/or diagnosis of SARS-CoV-2 by FDA under an Emergency Use Authorization (EUA). This EUA will remain  in effect (meaning this test can be used) for the duration of the COVID-19 declaration under Section 56 4(b)(1) of the Act, 21 U.S.C. section 360bbb-3(b)(1), unless the authorization is terminated or revoked sooner. Performed at [facility], [place] [address] [place], [place] [address]   Respiratory Panel by RT PCR (Flu A&B, Covid) - Nasopharyngeal Swab     Status: None   Collection Time: 11/15/19  3:09 AM   Specimen: Nasopharyngeal Swab  Result Value Ref Range Status   SARS Coronavirus 2  by RT PCR NEGATIVE NEGATIVE Final    Comment: (NOTE) SARS-CoV-2 target nucleic acids are NOT DETECTED. The SARS-CoV-2 RNA is generally detectable in upper respiratoy specimens during the acute phase of infection. The lowest concentration of SARS-CoV-2 viral copies this assay can detect is 131 copies/mL. A negative result does not preclude SARS-Cov-2 infection and should not be used as the sole basis for treatment or other patient management decisions. A negative result may occur with  improper specimen collection/handling, submission of specimen other than  nasopharyngeal swab, presence of viral mutation(s) within the areas targeted by this assay, and inadequate number of viral copies (<131 copies/mL). A negative result must be combined with clinical observations, patient history, and epidemiological information. The expected result is Negative. Fact Sheet for Patients:  PinkCheek.be Fact Sheet for Healthcare Providers:  GravelBags.it This test is not yet ap proved or cleared by the [place] FDA and  has been authorized for detection and/or diagnosis of SARS-CoV-2 by FDA under an Emergency Use Authorization (EUA). This EUA will remain  in effect (meaning this test can be used) for the duration of the COVID-19 declaration under Section 564(b)(1) of the Act, 21 U.S.C. section 360bbb-3(b)(1), unless the authorization is terminated or revoked sooner.    Influenza A by PCR NEGATIVE NEGATIVE Final   Influenza B by PCR NEGATIVE NEGATIVE Final    Comment: (NOTE) The Xpert Xpress SARS-CoV-2/FLU/RSV assay is intended as an aid in  the diagnosis of influenza from Nasopharyngeal swab specimens and  should not be used as a sole basis for treatment. Nasal washings and  aspirates are unacceptable for Xpert Xpress SARS-CoV-2/FLU/RSV  testing. Fact Sheet for Patients: PinkCheek.be Fact Sheet for Healthcare Providers: GravelBags.it This test is not yet approved or cleared by the [place] FDA and  has been authorized for detection and/or diagnosis of SARS-CoV-2 by  FDA under an Emergency Use Authorization (EUA). This EUA will remain  in effect (meaning this test can be used) for the duration of the  Covid-19 declaration under Section 564(b)(1) of the Act, 21  U.S.C. section 360bbb-3(b)(1), unless the authorization is  terminated or revoked. Performed at [facility], [place] [address] [place], [place] [address]           Radiology Studies: DG Chest 2 View  Result Date: 11/16/2019 CLINICAL DATA:  Pulmonary edema. EXAM: CHEST - 2 VIEW COMPARISON:  01/12/2020 FINDINGS: There is persistent mild cardiomegaly. There is persistent accentuation of the interstitial markings bilaterally with peribronchial thickening. No pleural effusions. No significant bone abnormality. IMPRESSION: Persistent mild cardiomegaly. Persistent accentuation of the interstitial markings with peribronchial thickening consistent with mild pulmonary edema. Electronically Signed   By: Lorriane Shire M.D.   On: 11/16/2019 07:58   DG Chest 2 View  Result Date: 11/14/2019 CLINICAL DATA:  Difficulty breathing. On dialysis. Status post COVID vaccine today. EXAM: CHEST - 2 VIEW COMPARISON:  12/05/2016. FINDINGS: Pulmonary vascular congestion with suspected mild interstitial edema. No definite pleural effusions. No pneumothorax. Cardiomegaly. Visualized osseous structures are within normal limits. IMPRESSION: Cardiomegaly with suspected mild interstitial edema. No definite pleural effusions. Electronically Signed   By: Julian Hy M.D.   On: 11/14/2019 23:31        Scheduled Meds: . carvedilol  25 mg Oral BID  . heparin  5,000 Units Subcutaneous Q8H  . hydrALAZINE  100 mg Oral TID  . insulin aspart  0-5 Units Subcutaneous QHS  . insulin aspart  0-6 Units Subcutaneous TID WC  . sodium  chloride flush  3 mL Intravenous Q12H   Continuous Infusions: . sodium chloride    . furosemide 100 mg (11/16/19 0500)          Aline August, MD [facility] 11/16/2019, 9:48 AM

## 2019-11-16 NOTE — Progress Notes (Signed)
Patient ID: Michael Blackwell, male   DOB: [birth year], 79 y.o.   MRN: [id number] S: Mr. Ostrom feels better and wants to go home today "I didn't sleep at all last night".  He denies any n/v/cp/sob and is on room air O:BP (!) 198/96 (BP Location: Left Arm)   Pulse 75   Temp 98 F (36.7 C) (Oral)   Resp 18   Ht 5\' 8"  (1.727 m)   Wt 78.3 kg   SpO2 98%   BMI 26.26 kg/m   Intake/Output Summary (Last 24 hours) at 11/16/2019 1314 Last data filed at 11/16/2019 0830 Gross per 24 hour  Intake 100 ml  Output 775 ml  Net -675 ml   Intake/Output: I/O last 3 completed shifts: In: 323 [P.O.:320; I.V.:3] Out: 1400 [Urine:1400]  Intake/Output this shift:  Total I/O In: -  Out: 275 [Urine:275] Weight change: -1.95 kg Gen: NAD but agitated CVS: no rub Resp: cta Abd: +BS, soft, NT/ND Ext: no edema, RUE AVG +T/B Neuro: no asterixis  Recent Labs  Lab 11/14/19 2229 11/15/19 0822 11/16/19 0252  NA 140 139 140  K 4.3 4.4 4.2  CL 107 108 109  CO2 18* 16* 16*  GLUCOSE 119* 99 128*  BUN 80* 80* 78*  CREATININE 9.15* 9.39* 9.66*  CALCIUM 9.2 8.8* 8.9   Liver Function Tests: No results for input(s): AST, ALT, ALKPHOS, BILITOT, PROT, ALBUMIN in the last 168 hours. No results for input(s): LIPASE, AMYLASE in the last 168 hours. No results for input(s): AMMONIA in the last 168 hours. CBC: Recent Labs  Lab 11/14/19 2229 11/15/19 0822 11/16/19 0252  WBC 7.1 6.0 5.9  NEUTROABS 4.9 4.3 4.2  HGB 14.8 13.2 13.3  HCT 46.9 40.9 41.8  MCV 88.0 86.7 86.7  PLT 254 197 228   Cardiac Enzymes: No results for input(s): CKTOTAL, CKMB, CKMBINDEX, TROPONINI in the last 168 hours. CBG: Recent Labs  Lab 11/15/19 0556 11/15/19 1222 11/15/19 1655 11/15/19 2055 11/16/19 0603  GLUCAP 112* 134* 115* 130* 114*    Iron Studies: No results for input(s): IRON, TIBC, TRANSFERRIN, FERRITIN in the last 72 hours. Studies/Results: DG Chest 2 View  Result Date: 11/16/2019 CLINICAL DATA:  Pulmonary edema.  EXAM: CHEST - 2 VIEW COMPARISON:  01/12/2020 FINDINGS: There is persistent mild cardiomegaly. There is persistent accentuation of the interstitial markings bilaterally with peribronchial thickening. No pleural effusions. No significant bone abnormality. IMPRESSION: Persistent mild cardiomegaly. Persistent accentuation of the interstitial markings with peribronchial thickening consistent with mild pulmonary edema. Electronically Signed   By: Lorriane Shire M.D.   On: 11/16/2019 07:58   DG Chest 2 View  Result Date: 11/14/2019 CLINICAL DATA:  Difficulty breathing. On dialysis. Status post COVID vaccine today. EXAM: CHEST - 2 VIEW COMPARISON:  12/05/2016. FINDINGS: Pulmonary vascular congestion with suspected mild interstitial edema. No definite pleural effusions. No pneumothorax. Cardiomegaly. Visualized osseous structures are within normal limits. IMPRESSION: Cardiomegaly with suspected mild interstitial edema. No definite pleural effusions. Electronically Signed   By: Julian Hy M.D.   On: 11/14/2019 23:31   ECHOCARDIOGRAM COMPLETE  Result Date: 11/16/2019    ECHOCARDIOGRAM REPORT   Patient Name:   QUANTA ROHER Date of Exam: 11/15/2019 Medical Rec #:  [id number]       Height:       68.0 in Accession #:    [id number]      Weight:       177.0 lb Date of Birth:  09-19-1941       BSA:  1.94 m Patient Age:    25 years        BP:           172/82 mmHg Patient Gender: M               HR:           81 bpm. Exam Location:  Inpatient Procedure: 2D Echo Indications:     acute systolic chf 010.27  History:         Patient has prior history of Echocardiogram examinations, most                  recent 12/06/2016. CHF, chronic kidney disease; Risk                  Factors:Hypertension, Dyslipidemia and Diabetes.  Sonographer:     Johny Chess RDCS Referring Phys:  [id number] Aline August Diagnosing Phys: Adrian Prows MD IMPRESSIONS  1. Left ventricular ejection fraction, by estimation, is 30 to 35%. The left  ventricle has moderately decreased function. The left ventricle demonstrates global hypokinesis. There is mild left ventricular hypertrophy. Left ventricular diastolic parameters are consistent with Grade I diastolic dysfunction (impaired relaxation). There is mild hypokinesis of the left ventricular, entire inferolateral wall and inferoseptal wall.  2. Right ventricular systolic function is normal. The right ventricular size is normal.  3. Left atrial size was severely dilated.  4. The mitral valve is normal in structure and function. Trivial mitral valve regurgitation.  5. The aortic valve is tricuspid. Aortic valve regurgitation is not visualized. No aortic stenosis is present.  6. The inferior vena cava is normal in size with greater than 50% respiratory variability, suggesting right atrial pressure of 3 mmHg. FINDINGS  Left Ventricle: Left ventricular ejection fraction, by estimation, is 30 to 35%. The left ventricle has moderately decreased function. The left ventricle demonstrates global hypokinesis. Mild hypokinesis of the left ventricular, entire inferolateral wall and inferoseptal wall. The left ventricular internal cavity size was normal in size. There is mild left ventricular hypertrophy. Left ventricular diastolic parameters are consistent with Grade I diastolic dysfunction (impaired relaxation). Right Ventricle: The right ventricular size is normal. No increase in right ventricular wall thickness. Right ventricular systolic function is normal. Left Atrium: Left atrial size was severely dilated. Right Atrium: Right atrial size was normal in size. Pericardium: A small pericardial effusion is present. Mitral Valve: The mitral valve is normal in structure and function. There is mild thickening of the mitral valve leaflet(s). Mild mitral annular calcification. Trivial mitral valve regurgitation. Tricuspid Valve: The tricuspid valve is normal in structure. Tricuspid valve regurgitation is trivial. Aortic  Valve: The aortic valve is tricuspid. . There is mild thickening of the aortic valve. Aortic valve regurgitation is not visualized. No aortic stenosis is present. There is mild thickening of the aortic valve. Pulmonic Valve: The pulmonic valve was normal in structure. Pulmonic valve regurgitation is not visualized. Aorta: The aortic root is normal in size and structure. Venous: The inferior vena cava is normal in size with greater than 50% respiratory variability, suggesting right atrial pressure of 3 mmHg. IAS/Shunts: The atrial septum is grossly normal. Additional Comments: There is no pleural effusion.  LEFT VENTRICLE PLAX 2D LVIDd:         5.80 cm  Diastology LVIDs:         4.60 cm  LV e' lateral:   5.55 cm/s LV PW:         1.10 cm  LV E/e'  lateral: 19.1 LV IVS:        1.00 cm  LV e' medial:    4.13 cm/s LVOT diam:     2.40 cm  LV E/e' medial:  25.7 LV SV:         84.60 ml LV SV Index:   35.00 LVOT Area:     4.52 cm  RIGHT VENTRICLE RV S prime:     16.00 cm/s TAPSE (M-mode): 1.9 cm LEFT ATRIUM              Index       RIGHT ATRIUM           Index LA diam:        4.60 cm  2.37 cm/m  RA Area:     17.00 cm LA Vol (A2C):   98.6 ml  50.82 ml/m RA Volume:   45.50 ml  23.45 ml/m LA Vol (A4C):   103.0 ml 53.08 ml/m LA Biplane Vol: 105.0 ml 54.11 ml/m  AORTIC VALVE LVOT Vmax:   96.00 cm/s LVOT Vmean:  54.400 cm/s LVOT VTI:    0.187 m  AORTA Ao Root diam: 3.20 cm MITRAL VALVE MV Area (PHT): 5.97 cm     SHUNTS MV Decel Time: 127 msec     Systemic VTI:  0.19 m MV E velocity: 106.00 cm/s  Systemic Diam: 2.40 cm MV A velocity: 153.00 cm/s MV E/A ratio:  0.69 Adrian Prows MD Electronically signed by Adrian Prows MD Signature Date/Time: 11/16/2019/10:29:32 AM    Final    . amLODipine  10 mg Oral Daily  . carvedilol  25 mg Oral BID  . heparin  5,000 Units Subcutaneous Q8H  . hydrALAZINE  100 mg Oral TID  . insulin aspart  0-5 Units Subcutaneous QHS  . insulin aspart  0-6 Units Subcutaneous TID WC  . sodium chloride  flush  3 mL Intravenous Q12H    BMET    Component Value Date/Time   NA 140 11/16/2019 0252   NA 141 03/01/2016 1120   K 4.2 11/16/2019 0252   K 3.6 03/01/2016 1120   CL 109 11/16/2019 0252   CO2 16 (L) 11/16/2019 0252   CO2 25 03/01/2016 1120   GLUCOSE 128 (H) 11/16/2019 0252   GLUCOSE 151 (H) 03/01/2016 1120   BUN 78 (H) 11/16/2019 0252   BUN 17.1 03/01/2016 1120   CREATININE 9.66 (H) 11/16/2019 0252   CREATININE 1.5 (H) 03/01/2016 1120   CALCIUM 8.9 11/16/2019 0252   CALCIUM 9.2 09/08/2019 0823   CALCIUM 9.2 03/01/2016 1120   GFRNONAA 5 (L) 11/16/2019 0252   GFRAA 5 (L) 11/16/2019 0252   CBC    Component Value Date/Time   WBC 5.9 11/16/2019 0252   RBC 4.82 11/16/2019 0252   HGB 13.3 11/16/2019 0252   HGB 10.5 (L) 03/01/2016 1120   HCT 41.8 11/16/2019 0252   HCT 32.5 (L) 03/01/2016 1120   PLT 228 11/16/2019 0252   PLT 254 03/01/2016 1120   MCV 86.7 11/16/2019 0252   MCV 82.5 03/01/2016 1120   MCH 27.6 11/16/2019 0252   MCHC 31.8 11/16/2019 0252   RDW 18.0 (H) 11/16/2019 0252   RDW 16.3 (H) 03/01/2016 1120   LYMPHSABS 1.0 11/16/2019 0252   LYMPHSABS 1.8 03/01/2016 1120   MONOABS 0.6 11/16/2019 0252   MONOABS 0.4 03/01/2016 1120   EOSABS 0.1 11/16/2019 0252   EOSABS 0.1 03/01/2016 1120   BASOSABS 0.0 11/16/2019 0252   BASOSABS 0.0 03/01/2016 1120  Assessment/Plan:  1. AKI/CKD stage 5- pt presented with urgent HTN and pulmonary edema.  He responded well to IV lasix and addition of amlodipine.  He is without uremic symptoms and understands that his kidney function is worse.  He would like to f/u in our office tomorrow and recheck labs and bp but will likely need to start outpatient HD.  I discussed with Dr. Starla Link who has agreed to discharge pt with close follow up at our office tomorrow at 1:15 pm 2. HTN urgency- agree with adding amlodipine.   3. Pulmonary edema- improved with iv lasix.  Would change to po lasix 160 mg bid 4. Anemia of CKD-  stable 5. Disposition- for discharge to home today f/u with our office tomorrow at 1:15 and arrange for outpatient HD.  Donetta Potts, MD [employer] [phone]

## 2019-11-16 NOTE — Progress Notes (Signed)
Patient's BP 196/103 10mg  IV Labetolol given will monitor

## 2019-11-16 NOTE — Plan of Care (Signed)
DISCHARGE NOTE HOME Michael Blackwell [name] to be discharged home per MD order. Discussed prescriptions and follow up appointments with the patient. Prescriptions given to patient; medication list explained in detail. Patient verbalized understanding.  Skin clean, dry and intact without evidence of skin break down, no evidence of skin tears noted. IV catheter discontinued intact per patient. Site without signs and symptoms of complications. Pt denies pain at the site currently. No complaints noted.  Patient free of lines, drains, and wounds.   An After Visit Summary (AVS) was printed and given to the patient. Patient escorted via wheelchair to the atrium where he wished to wait for his wife to return with the car.  Stephan Minister, RN

## 2019-11-17 ENCOUNTER — Inpatient Hospital Stay (HOSPITAL_COMMUNITY): Admission: RE | Admit: 2019-11-17 | Payer: Medicare Other | Source: Ambulatory Visit

## 2019-11-18 DIAGNOSIS — R0600 Dyspnea, unspecified: Secondary | ICD-10-CM | POA: Insufficient documentation

## 2019-11-18 DIAGNOSIS — Z8546 Personal history of malignant neoplasm of prostate: Secondary | ICD-10-CM | POA: Insufficient documentation

## 2019-11-18 DIAGNOSIS — Z720 Tobacco use: Secondary | ICD-10-CM | POA: Insufficient documentation

## 2019-11-18 DIAGNOSIS — I70219 Atherosclerosis of native arteries of extremities with intermittent claudication, unspecified extremity: Secondary | ICD-10-CM | POA: Insufficient documentation

## 2019-11-18 DIAGNOSIS — L299 Pruritus, unspecified: Secondary | ICD-10-CM | POA: Insufficient documentation

## 2019-11-18 DIAGNOSIS — E039 Hypothyroidism, unspecified: Secondary | ICD-10-CM | POA: Insufficient documentation

## 2019-11-18 DIAGNOSIS — R197 Diarrhea, unspecified: Secondary | ICD-10-CM | POA: Insufficient documentation

## 2019-11-18 DIAGNOSIS — N2581 Secondary hyperparathyroidism of renal origin: Secondary | ICD-10-CM | POA: Insufficient documentation

## 2019-11-18 DIAGNOSIS — I129 Hypertensive chronic kidney disease with stage 1 through stage 4 chronic kidney disease, or unspecified chronic kidney disease: Secondary | ICD-10-CM | POA: Insufficient documentation

## 2019-11-18 DIAGNOSIS — D689 Coagulation defect, unspecified: Secondary | ICD-10-CM | POA: Insufficient documentation

## 2019-11-18 DIAGNOSIS — I2510 Atherosclerotic heart disease of native coronary artery without angina pectoris: Secondary | ICD-10-CM | POA: Insufficient documentation

## 2019-11-18 DIAGNOSIS — E559 Vitamin D deficiency, unspecified: Secondary | ICD-10-CM | POA: Insufficient documentation

## 2019-11-18 DIAGNOSIS — R52 Pain, unspecified: Secondary | ICD-10-CM | POA: Insufficient documentation

## 2019-11-20 ENCOUNTER — Other Ambulatory Visit: Payer: Self-pay | Admitting: Cardiology

## 2019-11-27 DIAGNOSIS — E441 Mild protein-calorie malnutrition: Secondary | ICD-10-CM | POA: Insufficient documentation

## 2019-11-28 ENCOUNTER — Other Ambulatory Visit (HOSPITAL_COMMUNITY)
Admission: RE | Admit: 2019-11-28 | Discharge: 2019-11-28 | Disposition: A | Discharge status: Home / Self Care | Payer: Medicare Other | Source: Ambulatory Visit | Attending: Nephrology | Admitting: Nephrology

## 2019-11-28 DIAGNOSIS — E876 Hypokalemia: Secondary | ICD-10-CM | POA: Diagnosis present

## 2019-11-28 DIAGNOSIS — D631 Anemia in chronic kidney disease: Secondary | ICD-10-CM | POA: Insufficient documentation

## 2019-11-28 LAB — HEMOGLOBIN: Hemoglobin: 9.6 g/dL — ABNORMAL LOW (ref 13.0–17.0)

## 2019-11-28 LAB — CALCIUM: Calcium: 8.1 mg/dL — ABNORMAL LOW (ref 8.9–10.3)

## 2019-11-28 LAB — POTASSIUM: Potassium: 4.4 mmol/L (ref 3.5–5.1)

## 2019-12-07 ENCOUNTER — Ambulatory Visit (HOSPITAL_COMMUNITY)
Admission: RE | Admit: 2019-12-07 | Discharge: 2019-12-07 | Disposition: A | Discharge status: Home / Self Care | Payer: Medicare Other | Source: Ambulatory Visit | Attending: Surgery | Admitting: Surgery

## 2019-12-07 ENCOUNTER — Encounter: Payer: Self-pay | Admitting: Surgery

## 2019-12-07 ENCOUNTER — Ambulatory Visit (INDEPENDENT_AMBULATORY_CARE_PROVIDER_SITE_OTHER): Payer: Medicare Other | Admitting: Surgery

## 2019-12-07 ENCOUNTER — Other Ambulatory Visit: Payer: Self-pay

## 2019-12-07 ENCOUNTER — Ambulatory Visit (INDEPENDENT_AMBULATORY_CARE_PROVIDER_SITE_OTHER)
Admission: RE | Admit: 2019-12-07 | Discharge: 2019-12-07 | Disposition: A | Discharge status: Home / Self Care | Payer: Medicare Other | Source: Ambulatory Visit | Attending: Surgery | Admitting: Surgery

## 2019-12-07 VITALS — BP 155/77 | HR 54 | Temp 98.0°F | Resp 20 | Ht 68.0 in | Wt 180.0 lb

## 2019-12-07 DIAGNOSIS — N184 Chronic kidney disease, stage 4 (severe): Secondary | ICD-10-CM

## 2019-12-07 DIAGNOSIS — N186 End stage renal disease: Secondary | ICD-10-CM | POA: Diagnosis not present

## 2019-12-07 DIAGNOSIS — I701 Atherosclerosis of renal artery: Secondary | ICD-10-CM | POA: Diagnosis not present

## 2019-12-07 DIAGNOSIS — Z992 Dependence on renal dialysis: Secondary | ICD-10-CM | POA: Diagnosis not present

## 2019-12-07 NOTE — Progress Notes (Signed)
[facility]  Patient name: Michael Blackwell MRN: [id number] DOB: 1940-11-27 Sex: male   REQUESTING PROVIDER:    Dr. Marval Regal   REASON FOR CONSULT:    Dialysis access  HISTORY OF PRESENT ILLNESS:   Michael Blackwell is a 79 y.o. male, who is back today for dialysis access creation.  He has a history of a right basilic vein fistula that was created by Dr. Donzetta Matters in 2020.  This is occluded.  He states that he had a bad cannulation which led to a hematoma causing the fistula to occlude.  He now has a right sided catheter.  He does state that he was offered transplant several months ago but turned it down.  Now he thinks he made a mistake.  The patient is left-handed.   The patient is renal failure secondary to diabetes and hypertension.  He takes a statin for hypercholesterolemia.  He is a former smoker.  PAST MEDICAL HISTORY    Past Medical History:  Diagnosis Date  . Acute respiratory failure with hypoxia ([place]) 12/05/2016   [name] 12/05/2016  . Anemia   . Anxiety   . Arthritis    "joints ache at night sometimes" (3/8/20180  . Chronic kidney disease (CKD), stage II (mild)    Acute on chronic kidney disease stage II-III/notes 12/06/2016  . Dyspnea    with exertion  . Heart murmur    never has caused any problems per patient  . History of blood transfusion   . History of radiation therapy 02/25/14- 04/23/14   prostate 7800 cGy 40 sessions, seminal vesicles 5600 cGy 40 sessions  . Hypercholesteremia   . Hypertension   . OSA on CPAP   . Pneumonia 12/06/2016  . Prostate cancer ([place]) 11/19/13   gleason 4+3=7, volume 34.74 cc  . Spermatocele    right  . Type II diabetes mellitus (HCC)    diet controlled  . Wears partial dentures    upper     FAMILY HISTORY   Family History  Problem Relation Age of Onset  . Cancer Brother        brain  . Cancer Mother        stomach  . Cancer Father        ? prostate     SOCIAL HISTORY:   Social History   Socioeconomic History  . Marital status: Married    Spouse name: Not on file  . Number of children: Not on file  . Years of education: Not on file  . Highest education level: Not on file  Occupational History  . Not on file  Tobacco Use  . Smoking status: Former Smoker    Years: 48.00    Types: Cigarettes    Quit date: 07/01/2018    Years since quitting: 1.4  . Smokeless tobacco: Never Used  . Tobacco comment: "I smoke 1 cig a month"- per patient on 05/25/19  Substance and Sexual Activity  . Alcohol use: Not Currently  . Drug use: No  . Sexual activity: Yes  Other Topics Concern  . Not on file  Social History Narrative  . Not on file   Social Determinants of Health   Financial Resource Strain:   . Difficulty of Paying Living Expenses: Not on file  Food Insecurity:   . Worried About Charity fundraiser in the Last Year: Not on file  . Ran Out of Food in the Last Year: Not on file  Transportation Needs:   .  Lack of Transportation (Medical): Not on file  . Lack of Transportation (Non-Medical): Not on file  Physical Activity:   . Days of Exercise per Week: Not on file  . Minutes of Exercise per Session: Not on file  Stress:   . Feeling of Stress : Not on file  Social Connections:   . Frequency of Communication with Friends and Family: Not on file  . Frequency of Social Gatherings with Friends and Family: Not on file  . Attends Religious Services: Not on file  . Active Member of Clubs or Organizations: Not on file  . Attends Archivist Meetings: Not on file  . Marital Status: Not on file  Intimate Partner Violence:   . Fear of Current or Ex-Partner: Not on file  . Emotionally Abused: Not on file  . Physically Abused: Not on file  . Sexually Abused: Not on file    ALLERGIES:    No Known Allergies  CURRENT MEDICATIONS:    Current Outpatient Medications  Medication Sig Dispense Refill  . amLODipine (NORVASC)  10 MG tablet Take 1 tablet (10 mg total) by mouth daily. Please schedule appointment for refills 30 tablet 1  . aspirin 81 MG tablet Take 81 mg by mouth at bedtime.     . bismuth subsalicylate (PEPTO BISMOL) 262 MG/15ML suspension Take 30 mLs by mouth every 6 (six) hours as needed for indigestion.    . calcitRIOL (ROCALTROL) 0.25 MCG capsule Take 0.25 mcg by mouth daily.    . Calcium Carb-Cholecalciferol (CALCIUM 600+D) 600-800 MG-UNIT TABS Take 1 tablet by mouth 2 (two) times a day.    . carvedilol (COREG) 25 MG tablet Take 25 mg by mouth 2 (two) times daily.     . cholecalciferol (VITAMIN D3) 25 MCG (1000 UT) tablet Take 1,000 Units by mouth daily.    . cloNIDine (CATAPRES) 0.3 MG tablet Take 0.3 mg by mouth 2 (two) times daily.     . Ferrous Sulfate (IRON) 325 (65 Fe) MG TABS Take 1 tablet by mouth twice daily (Patient taking differently: Take 325 mg by mouth in the morning and at bedtime. ) 180 tablet 0  . furosemide (LASIX) 80 MG tablet Take 2 tablets (160 mg total) by mouth 2 (two) times daily. 30 tablet 0  . hydrALAZINE (APRESOLINE) 100 MG tablet Take 100 mg by mouth 3 (three) times daily.     . Multiple Vitamins-Minerals (CENTRUM SILVER 50+MEN) TABS Take 1 tablet by mouth daily.    . Omega-3 Fatty Acids (OMEGA 3 PO) Take 1,040 mg by mouth 2 (two) times daily.     . phenylephrine (NEO-SYNEPHRINE COLD & SINUS) 1 % nasal spray Place 1 drop into both nostrils at bedtime as needed for congestion.     . rosuvastatin (CRESTOR) 10 MG tablet Take 1 tablet by mouth once daily (Patient taking differently: Take 10 mg by mouth daily. ) 90 tablet 0  . sodium bicarbonate 650 MG tablet Take 650 mg by mouth 2 (two) times daily.    . tamsulosin (FLOMAX) 0.4 MG CAPS capsule Take 1 capsule (0.4 mg total) by mouth every other day.     No current facility-administered medications for this visit.    REVIEW OF SYSTEMS:   [X]  denotes positive finding, [ ]  denotes negative finding Cardiac  Comments:  Chest  pain or chest pressure:    Shortness of breath upon exertion:    Short of breath when lying flat:    Irregular heart rhythm:  Vascular    Pain in calf, thigh, or hip brought on by ambulation:    Pain in feet at night that wakes you up from your sleep:     Blood clot in your veins:    Leg swelling:         Pulmonary    Oxygen at home:    Productive cough:     Wheezing:         Neurologic    Sudden weakness in arms or legs:     Sudden numbness in arms or legs:     Sudden onset of difficulty speaking or slurred speech:    Temporary loss of vision in one eye:     Problems with dizziness:         Gastrointestinal    Blood in stool:      Vomited blood:         Genitourinary    Burning when urinating:     Blood in urine:        Psychiatric    Major depression:         Hematologic    Bleeding problems:    Problems with blood clotting too easily:        Skin    Rashes or ulcers:        Constitutional    Fever or chills:     PHYSICAL EXAM:   Vitals:   12/07/19 1451  BP: (!) 155/77  Pulse: (!) 54  Resp: 20  Temp: 98 F (36.7 C)  SpO2: 99%  Weight: 180 lb (81.6 kg)  Height: 5\' 8"  (1.727 m)    GENERAL: The patient is a well-nourished male, in no acute distress. The vital signs are documented above. CARDIAC: There is a regular rate and rhythm.  VASCULAR: There is no thrill within the basilic vein fistula.  SonoSite evaluation of the right arm did not reveal any surface vein suitable for dialysis access PULMONARY: Nonlabored respirations ABDOMEN: Soft and non-tender with normal pitched bowel sounds.  MUSCULOSKELETAL: There are no major deformities or cyanosis. NEUROLOGIC: No focal weakness or paresthesias are detected. SKIN: There are no ulcers or rashes noted. PSYCHIATRIC: The patient has a normal affect.  STUDIES:   I have reviewed the following: Arterial: No evidence of stenosis +-----------------+-------------+----------+--------+  Left Cephalic   Diameter (cm)Depth (cm)Findings  +-----------------+-------------+----------+--------+  Shoulder       0.27              +-----------------+-------------+----------+--------+  Prox upper arm    0.25              +-----------------+-------------+----------+--------+  Mid upper arm    0.32              +-----------------+-------------+----------+--------+  Dist upper arm    0.33              +-----------------+-------------+----------+--------+  Antecubital fossa  0.38              +-----------------+-------------+----------+--------+  Prox forearm     0.31              +-----------------+-------------+----------+--------+  Mid forearm     0.32              +-----------------+-------------+----------+--------+  Dist forearm     0.27              +-----------------+-------------+----------+--------+   +-----------------+-------------+----------+---------+  Left Basilic   Diameter (cm)Depth (cm)Findings   +-----------------+-------------+----------+---------+  Prox upper arm    0.48               +-----------------+-------------+----------+---------+  Mid upper arm    0.42               +-----------------+-------------+----------+---------+  Dist upper arm    0.29               +-----------------+-------------+----------+---------+  Antecubital fossa  0.22        branching  +-----------------+-------------+----------+---------+   ASSESSMENT and PLAN   End-stage renal disease: We discussed proceeding with left arm fistula versus a right arm graft.  Because the patient is left-handed, he would like to keep access in his right arm.  Therefore I have recommended proceeding with a right arm graft.  This potentially could be a forearm graft based on the  size of his basilic vein.  The patient wants to talk about this further with his nephrologist as well as the transplant team.  He really wants to avoid another surgery.  I told him that that is not likely that he is going to have to have some kind of access in the near future.  He is going to contact me with a surgery date.   Leia Alf, MD, FACS [facility] [phone] Pager [phone]

## 2019-12-17 ENCOUNTER — Telehealth (HOSPITAL_COMMUNITY): Payer: Self-pay

## 2019-12-17 NOTE — Telephone Encounter (Signed)

## 2019-12-18 ENCOUNTER — Other Ambulatory Visit: Payer: Self-pay

## 2019-12-18 ENCOUNTER — Ambulatory Visit (INDEPENDENT_AMBULATORY_CARE_PROVIDER_SITE_OTHER): Payer: Medicare Other | Admitting: Physician Assistant

## 2019-12-18 VITALS — BP 157/70 | HR 75 | Temp 98.2°F | Ht 68.0 in | Wt 177.7 lb

## 2019-12-18 DIAGNOSIS — N186 End stage renal disease: Secondary | ICD-10-CM

## 2019-12-18 DIAGNOSIS — Z992 Dependence on renal dialysis: Secondary | ICD-10-CM

## 2019-12-18 NOTE — Progress Notes (Signed)
Patient ID: Michael Blackwell, male   DOB: Dec 20, 1940, 79 y.o.   MRN: [id number]  Reason for Consult: Follow-up   Referred by Rogers Blocker, MD  Subjective:     HPI:  Michael Blackwell is a 79 y.o. male on chronic intermittent hemo-dialysis via right IJ tunneled dialysis catheter.  Disposed right basilic vein fistula that occluded.  The patient is left-handed.  He was seen in the office approximately 10 days ago and discussed options with Dr. Trula Slade.  He has since considered his options and wishes to proceed with right upper extremity access.  Today, he denies hand pain, numbness, fever or chills he takes a daily aspirin  Past Medical History:  Diagnosis Date  . Acute respiratory failure with hypoxia ([place]) 12/05/2016   [name] 12/05/2016  . Anemia   . Anxiety   . Arthritis    "joints ache at night sometimes" (3/8/20180  . Chronic kidney disease (CKD), stage II (mild)    Acute on chronic kidney disease stage II-III/notes 12/06/2016  . Dyspnea    with exertion  . Heart murmur    never has caused any problems per patient  . History of blood transfusion   . History of radiation therapy 02/25/14- 04/23/14   prostate 7800 cGy 40 sessions, seminal vesicles 5600 cGy 40 sessions  . Hypercholesteremia   . Hypertension   . OSA on CPAP   . Pneumonia 12/06/2016  . Prostate cancer ([place]) 11/19/13   gleason 4+3=7, volume 34.74 cc  . Spermatocele    right  . Type II diabetes mellitus (HCC)    diet controlled  . Wears partial dentures    upper   Family History  Problem Relation Age of Onset  . Cancer Brother        brain  . Cancer Mother        stomach  . Cancer Father        ? prostate   Past Surgical History:  Procedure Laterality Date  . AV FISTULA PLACEMENT Right 03/04/2019   Procedure: RIGHT ARM ARTERIOVENOUS  FISTULA CREATION;  Surgeon: [name], MD;  Location: [facility];  Service: Vascular;  Laterality: Right;  . BASCILIC VEIN TRANSPOSITION Right 05/26/2019   Procedure:  Right Arm BASILIC VEIN TRANSPOSITION SECOND STAGE;  Surgeon: [name], MD;  Location: [facility];  Service: Vascular;  Laterality: Right;  . COLONOSCOPY    . LOWER EXTREMITY ANGIOGRAPHY Bilateral 02/25/2018   Procedure: Lower Extremity Angiography;  Surgeon: [name], MD;  Location: [facility];  Service: Cardiovascular;  Laterality: Bilateral;  limited  . PROSTATE BIOPSY  11/19/13   Gleason 4+3=7, vol 34.74 cc  . RENAL ANGIOGRAPHY N/A 02/25/2018   Procedure: RENAL ANGIOGRAPHY;  Surgeon: [name], MD;  Location: [facility];  Service: Cardiovascular;  Laterality: N/A;  . TONSILLECTOMY     as child    Short Social History:  Social History   Tobacco Use  . Smoking status: Former Smoker    Years: 48.00    Types: Cigarettes    Quit date: 07/01/2018    Years since quitting: 1.4  . Smokeless tobacco: Never Used  . Tobacco comment: "I smoke 1 cig a month"- per patient on 05/25/19  Substance Use Topics  . Alcohol use: Not Currently    No Known Allergies  Current Outpatient Medications  Medication Sig Dispense Refill  . aspirin 81 MG tablet Take 81 mg by mouth at bedtime.     Marland Kitchen  bismuth subsalicylate (PEPTO BISMOL) 262 MG/15ML suspension Take 30 mLs by mouth every 6 (six) hours as needed for indigestion.    . calcitRIOL (ROCALTROL) 0.25 MCG capsule Take 0.25 mcg by mouth daily.    . Calcium Carb-Cholecalciferol (CALCIUM 600+D) 600-800 MG-UNIT TABS Take 1 tablet by mouth 2 (two) times a day.    . carvedilol (COREG) 25 MG tablet Take 25 mg by mouth 2 (two) times daily.     . cholecalciferol (VITAMIN D3) 25 MCG (1000 UT) tablet Take 1,000 Units by mouth daily.    . cloNIDine (CATAPRES) 0.3 MG tablet Take 0.3 mg by mouth 2 (two) times daily.     . Ferrous Sulfate (IRON) 325 (65 Fe) MG TABS Take 1 tablet by mouth twice daily (Patient taking differently: Take 325 mg by mouth in the morning and at bedtime. ) 180 tablet 0  . furosemide (LASIX) 80 MG  tablet Take 2 tablets (160 mg total) by mouth 2 (two) times daily. 30 tablet 0  . hydrALAZINE (APRESOLINE) 100 MG tablet Take 100 mg by mouth 3 (three) times daily.     . Multiple Vitamins-Minerals (CENTRUM SILVER 50+MEN) TABS Take 1 tablet by mouth daily.    . Omega-3 Fatty Acids (OMEGA 3 PO) Take 1,040 mg by mouth 2 (two) times daily.     . phenylephrine (NEO-SYNEPHRINE COLD & SINUS) 1 % nasal spray Place 1 drop into both nostrils at bedtime as needed for congestion.     . rosuvastatin (CRESTOR) 10 MG tablet Take 1 tablet by mouth once daily (Patient taking differently: Take 10 mg by mouth daily. ) 90 tablet 0  . tamsulosin (FLOMAX) 0.4 MG CAPS capsule Take 1 capsule (0.4 mg total) by mouth every other day.     No current facility-administered medications for this visit.    REVIEW OF SYSTEMS see HPI     Objective:  Objective    Vitals:   12/18/19 1505  BP: (!) 157/70  Pulse: 75  Temp: 98.2 F (36.8 C)  TempSrc: Oral  SpO2: 99%  Weight: 177 lb 11.2 oz (80.6 kg)  Height: 5\' 8"  (1.727 m)   Body mass index is 27.02 kg/m.  Physical Exam General appearance: The patient is a well-developed well-nourished male in no apparent distress Cardiac heart rate and rhythm is regular Respiratory: Nonlabored Right upper extremity: Incisions well-healed 2+ radial pulse.  5 out of 5 right hand grip strength intact sensation  Data: No new     Assessment/Plan:    79 year old male on hemodialysis presents for permanent access.  Per Dr. Stephens Shire note, we will proceed with right upper extremity AV loop graft either in the forearm or upper arm.  I discussed the procedure with the patient and his questions are answered.    Barbie Banner, PA-C [facility]  [facility] MD: Donzetta Matters

## 2019-12-22 ENCOUNTER — Other Ambulatory Visit: Payer: Self-pay

## 2019-12-26 DIAGNOSIS — T886XXA Anaphylactic reaction due to adverse effect of correct drug or medicament properly administered, initial encounter: Secondary | ICD-10-CM | POA: Insufficient documentation

## 2019-12-28 ENCOUNTER — Other Ambulatory Visit: Payer: Self-pay

## 2019-12-28 ENCOUNTER — Other Ambulatory Visit (HOSPITAL_COMMUNITY)
Admission: RE | Admit: 2019-12-28 | Discharge: 2019-12-28 | Disposition: A | Discharge status: Home / Self Care | Payer: Medicare Other | Source: Ambulatory Visit | Attending: Vascular Surgery | Admitting: Vascular Surgery

## 2019-12-28 ENCOUNTER — Encounter (HOSPITAL_COMMUNITY): Payer: Self-pay | Admitting: Vascular Surgery

## 2019-12-28 DIAGNOSIS — Z01812 Encounter for preprocedural laboratory examination: Secondary | ICD-10-CM | POA: Diagnosis present

## 2019-12-28 DIAGNOSIS — Z20822 Contact with and (suspected) exposure to covid-19: Secondary | ICD-10-CM | POA: Diagnosis not present

## 2019-12-28 LAB — SARS CORONAVIRUS 2 (TAT 6-24 HRS): SARS Coronavirus 2: NEGATIVE

## 2019-12-28 NOTE — Progress Notes (Signed)
Mr. Michael Blackwell denies chest pain or shortness of breath. Patient was tested for Covid today and is in quarantine with his wife.  Mr. Michael Blackwell has type II diabetes, he does not take any medications or check CBG.  Patient said that he will check CBG the am of surgery. I instructed patient if CBG was less than 70 to drink 1/2 cup of clear juice, apple or cranberry and check it again in 15 minutes,

## 2019-12-30 ENCOUNTER — Ambulatory Visit (HOSPITAL_COMMUNITY): Payer: Medicare Other | Admitting: Certified Registered"

## 2019-12-30 ENCOUNTER — Ambulatory Visit (HOSPITAL_COMMUNITY)
Admission: RE | Admit: 2019-12-30 | Discharge: 2019-12-30 | Disposition: A | Discharge status: Home / Self Care | Payer: Medicare Other | Attending: Vascular Surgery | Admitting: Vascular Surgery

## 2019-12-30 ENCOUNTER — Other Ambulatory Visit: Payer: Self-pay

## 2019-12-30 ENCOUNTER — Encounter (HOSPITAL_COMMUNITY): Payer: Self-pay | Admitting: Vascular Surgery

## 2019-12-30 ENCOUNTER — Encounter (HOSPITAL_COMMUNITY)
Admission: RE | Disposition: A | Discharge status: Home / Self Care | Payer: Self-pay | Source: Home / Self Care | Attending: Vascular Surgery

## 2019-12-30 DIAGNOSIS — Z992 Dependence on renal dialysis: Secondary | ICD-10-CM | POA: Diagnosis not present

## 2019-12-30 DIAGNOSIS — M1990 Unspecified osteoarthritis, unspecified site: Secondary | ICD-10-CM | POA: Insufficient documentation

## 2019-12-30 DIAGNOSIS — T82868A Thrombosis of vascular prosthetic devices, implants and grafts, initial encounter: Secondary | ICD-10-CM | POA: Diagnosis not present

## 2019-12-30 DIAGNOSIS — Z808 Family history of malignant neoplasm of other organs or systems: Secondary | ICD-10-CM | POA: Insufficient documentation

## 2019-12-30 DIAGNOSIS — Y939 Activity, unspecified: Secondary | ICD-10-CM | POA: Insufficient documentation

## 2019-12-30 DIAGNOSIS — Z923 Personal history of irradiation: Secondary | ICD-10-CM | POA: Diagnosis not present

## 2019-12-30 DIAGNOSIS — X58XXXA Exposure to other specified factors, initial encounter: Secondary | ICD-10-CM | POA: Insufficient documentation

## 2019-12-30 DIAGNOSIS — E7800 Pure hypercholesterolemia, unspecified: Secondary | ICD-10-CM | POA: Diagnosis not present

## 2019-12-30 DIAGNOSIS — Z79899 Other long term (current) drug therapy: Secondary | ICD-10-CM | POA: Insufficient documentation

## 2019-12-30 DIAGNOSIS — Z7982 Long term (current) use of aspirin: Secondary | ICD-10-CM | POA: Diagnosis not present

## 2019-12-30 DIAGNOSIS — G4733 Obstructive sleep apnea (adult) (pediatric): Secondary | ICD-10-CM | POA: Insufficient documentation

## 2019-12-30 DIAGNOSIS — E1122 Type 2 diabetes mellitus with diabetic chronic kidney disease: Secondary | ICD-10-CM | POA: Diagnosis not present

## 2019-12-30 DIAGNOSIS — N185 Chronic kidney disease, stage 5: Secondary | ICD-10-CM | POA: Diagnosis not present

## 2019-12-30 DIAGNOSIS — N186 End stage renal disease: Secondary | ICD-10-CM | POA: Diagnosis not present

## 2019-12-30 DIAGNOSIS — I132 Hypertensive heart and chronic kidney disease with heart failure and with stage 5 chronic kidney disease, or end stage renal disease: Secondary | ICD-10-CM | POA: Insufficient documentation

## 2019-12-30 DIAGNOSIS — Z87891 Personal history of nicotine dependence: Secondary | ICD-10-CM | POA: Insufficient documentation

## 2019-12-30 DIAGNOSIS — I509 Heart failure, unspecified: Secondary | ICD-10-CM | POA: Diagnosis not present

## 2019-12-30 DIAGNOSIS — Z8546 Personal history of malignant neoplasm of prostate: Secondary | ICD-10-CM | POA: Insufficient documentation

## 2019-12-30 DIAGNOSIS — E1151 Type 2 diabetes mellitus with diabetic peripheral angiopathy without gangrene: Secondary | ICD-10-CM | POA: Insufficient documentation

## 2019-12-30 DIAGNOSIS — F419 Anxiety disorder, unspecified: Secondary | ICD-10-CM | POA: Insufficient documentation

## 2019-12-30 DIAGNOSIS — Z800 Family history of malignant neoplasm of digestive organs: Secondary | ICD-10-CM | POA: Insufficient documentation

## 2019-12-30 HISTORY — DX: End stage renal disease: N18.6

## 2019-12-30 HISTORY — PX: AV FISTULA PLACEMENT: SHX1204

## 2019-12-30 LAB — POCT I-STAT, CHEM 8
BUN: 28 mg/dL — ABNORMAL HIGH (ref 8–23)
Calcium, Ion: 1.15 mmol/L (ref 1.15–1.40)
Chloride: 99 mmol/L (ref 98–111)
Creatinine, Ser: 5 mg/dL — ABNORMAL HIGH (ref 0.61–1.24)
Glucose, Bld: 121 mg/dL — ABNORMAL HIGH (ref 70–99)
HCT: 28 % — ABNORMAL LOW (ref 39.0–52.0)
Hemoglobin: 9.5 g/dL — ABNORMAL LOW (ref 13.0–17.0)
Potassium: 4.2 mmol/L (ref 3.5–5.1)
Sodium: 134 mmol/L — ABNORMAL LOW (ref 135–145)
TCO2: 29 mmol/L (ref 22–32)

## 2019-12-30 LAB — GLUCOSE, CAPILLARY: Glucose-Capillary: 110 mg/dL — ABNORMAL HIGH (ref 70–99)

## 2019-12-30 SURGERY — INSERTION OF ARTERIOVENOUS (AV) GORE-TEX GRAFT ARM
Anesthesia: Monitor Anesthesia Care | Site: Arm Upper | Laterality: Right

## 2019-12-30 MED ORDER — CEFAZOLIN SODIUM-DEXTROSE 2-4 GM/100ML-% IV SOLN
2.0000 g | INTRAVENOUS | Status: AC
  Administered 2019-12-30: 08:00:00 2 g via INTRAVENOUS
  Filled 2019-12-30: qty 100

## 2019-12-30 MED ORDER — ACETAMINOPHEN 160 MG/5ML PO SOLN: 325.0000 mg | Freq: Once | ORAL | Status: DC | PRN

## 2019-12-30 MED ORDER — FENTANYL CITRATE (PF) 250 MCG/5ML IJ SOLN
INTRAMUSCULAR | Status: AC
  Filled 2019-12-30: qty 5

## 2019-12-30 MED ORDER — PROPOFOL 500 MG/50ML IV EMUL
INTRAVENOUS | Status: DC | PRN
  Administered 2019-12-30: 100 ug/kg/min via INTRAVENOUS

## 2019-12-30 MED ORDER — LIDOCAINE HCL 1 % IJ SOLN
INTRAMUSCULAR | Status: DC | PRN
  Administered 2019-12-30: 10 mL via INTRADERMAL

## 2019-12-30 MED ORDER — ONDANSETRON HCL 4 MG/2ML IJ SOLN
INTRAMUSCULAR | Status: AC
  Filled 2019-12-30: qty 2

## 2019-12-30 MED ORDER — SODIUM CHLORIDE 0.9 % IV SOLN
INTRAVENOUS | Status: DC | PRN
  Administered 2019-12-30: 500 mL

## 2019-12-30 MED ORDER — LIDOCAINE HCL (PF) 1 % IJ SOLN
INTRAMUSCULAR | Status: AC
  Filled 2019-12-30: qty 30

## 2019-12-30 MED ORDER — CHLORHEXIDINE GLUCONATE 4 % EX LIQD: 60.0000 mL | Freq: Once | CUTANEOUS | Status: DC

## 2019-12-30 MED ORDER — HEPARIN SODIUM (PORCINE) 1000 UNIT/ML IJ SOLN
INTRAMUSCULAR | Status: DC | PRN
  Administered 2019-12-30: 5000 [IU] via INTRAVENOUS

## 2019-12-30 MED ORDER — FENTANYL CITRATE (PF) 100 MCG/2ML IJ SOLN: 25.0000 ug | INTRAMUSCULAR | Status: DC | PRN

## 2019-12-30 MED ORDER — HEPARIN SODIUM (PORCINE) 1000 UNIT/ML IJ SOLN
INTRAMUSCULAR | Status: AC
  Filled 2019-12-30: qty 1

## 2019-12-30 MED ORDER — ONDANSETRON HCL 4 MG/2ML IJ SOLN
INTRAMUSCULAR | Status: DC | PRN
  Administered 2019-12-30: 4 mg via INTRAVENOUS

## 2019-12-30 MED ORDER — PROPOFOL 10 MG/ML IV BOLUS
INTRAVENOUS | Status: AC
  Filled 2019-12-30: qty 20

## 2019-12-30 MED ORDER — LACTATED RINGERS IV SOLN: INTRAVENOUS | Status: DC

## 2019-12-30 MED ORDER — ACETAMINOPHEN 10 MG/ML IV SOLN: 1000.0000 mg | Freq: Once | INTRAVENOUS | Status: DC | PRN

## 2019-12-30 MED ORDER — SODIUM CHLORIDE 0.9 % IV SOLN: INTRAVENOUS | Status: DC

## 2019-12-30 MED ORDER — 0.9 % SODIUM CHLORIDE (POUR BTL) OPTIME
TOPICAL | Status: DC | PRN
  Administered 2019-12-30: 1000 mL

## 2019-12-30 MED ORDER — PROPOFOL 1000 MG/100ML IV EMUL
INTRAVENOUS | Status: AC
  Filled 2019-12-30: qty 100

## 2019-12-30 MED ORDER — ACETAMINOPHEN 325 MG PO TABS: 325.0000 mg | ORAL_TABLET | Freq: Once | ORAL | Status: DC | PRN

## 2019-12-30 MED ORDER — HYDROCODONE-ACETAMINOPHEN 5-325 MG PO TABS: 1.0000 | ORAL_TABLET | Freq: Four times a day (QID) | ORAL | 0 refills | Status: DC | PRN

## 2019-12-30 MED ORDER — PROTAMINE SULFATE 10 MG/ML IV SOLN
INTRAVENOUS | Status: DC | PRN
  Administered 2019-12-30: 20 mg via INTRAVENOUS

## 2019-12-30 MED ORDER — SODIUM CHLORIDE 0.9 % IV SOLN
INTRAVENOUS | Status: AC
  Filled 2019-12-30: qty 1.2

## 2019-12-30 MED ORDER — FENTANYL CITRATE (PF) 100 MCG/2ML IJ SOLN
INTRAMUSCULAR | Status: DC | PRN
  Administered 2019-12-30 (×2): 25 ug via INTRAVENOUS

## 2019-12-30 SURGICAL SUPPLY — 39 items
ARMBAND PINK RESTRICT EXTREMIT (MISCELLANEOUS) ×3 IMPLANT
BLADE CLIPPER SURG (BLADE) ×3 IMPLANT
CANISTER SUCT 3000ML PPV (MISCELLANEOUS) ×3 IMPLANT
CLIP VESOCCLUDE MED 6/CT (CLIP) ×3 IMPLANT
CLIP VESOCCLUDE SM WIDE 6/CT (CLIP) ×6 IMPLANT
COVER PROBE W GEL 5X96 (DRAPES) ×3 IMPLANT
COVER WAND RF STERILE (DRAPES) IMPLANT
DECANTER SPIKE VIAL GLASS SM (MISCELLANEOUS) ×3 IMPLANT
DERMABOND ADHESIVE PROPEN (GAUZE/BANDAGES/DRESSINGS) ×2
DERMABOND ADVANCED (GAUZE/BANDAGES/DRESSINGS) ×2
DERMABOND ADVANCED .7 DNX12 (GAUZE/BANDAGES/DRESSINGS) ×1 IMPLANT
DERMABOND ADVANCED .7 DNX6 (GAUZE/BANDAGES/DRESSINGS) ×1 IMPLANT
ELECT REM PT RETURN 9FT ADLT (ELECTROSURGICAL) ×3
ELECTRODE REM PT RTRN 9FT ADLT (ELECTROSURGICAL) ×1 IMPLANT
GLOVE BIO SURGEON STRL SZ7.5 (GLOVE) ×3 IMPLANT
GLOVE BIOGEL PI IND STRL 8 (GLOVE) ×1 IMPLANT
GLOVE BIOGEL PI INDICATOR 8 (GLOVE) ×2
GOWN STRL REUS W/ TWL LRG LVL3 (GOWN DISPOSABLE) ×2 IMPLANT
GOWN STRL REUS W/ TWL XL LVL3 (GOWN DISPOSABLE) ×2 IMPLANT
GOWN STRL REUS W/TWL LRG LVL3 (GOWN DISPOSABLE) ×6
GOWN STRL REUS W/TWL XL LVL3 (GOWN DISPOSABLE) ×6
GRAFT GORETEX STRT 4-7X45 (Vascular Products) ×3 IMPLANT
HEMOSTAT SPONGE AVITENE ULTRA (HEMOSTASIS) IMPLANT
KIT BASIN OR (CUSTOM PROCEDURE TRAY) ×3 IMPLANT
KIT TURNOVER KIT B (KITS) ×3 IMPLANT
LOOP VESSEL MINI RED (MISCELLANEOUS) ×6 IMPLANT
NS IRRIG 1000ML POUR BTL (IV SOLUTION) ×3 IMPLANT
PACK CV ACCESS (CUSTOM PROCEDURE TRAY) ×3 IMPLANT
PAD ARMBOARD 7.5X6 YLW CONV (MISCELLANEOUS) ×6 IMPLANT
SUT GORETEX 6.0 TT13 (SUTURE) IMPLANT
SUT MNCRL AB 4-0 PS2 18 (SUTURE) ×3 IMPLANT
SUT PROLENE 6 0 BV (SUTURE) ×6 IMPLANT
SUT PROLENE 7 0 BV 1 (SUTURE) IMPLANT
SUT SILK 2 0 PERMA HAND 18 BK (SUTURE) IMPLANT
SUT VIC AB 3-0 SH 27 (SUTURE) ×6
SUT VIC AB 3-0 SH 27X BRD (SUTURE) ×2 IMPLANT
TOWEL GREEN STERILE (TOWEL DISPOSABLE) ×3 IMPLANT
UNDERPAD 30X30 (UNDERPADS AND DIAPERS) ×3 IMPLANT
WATER STERILE IRR 1000ML POUR (IV SOLUTION) ×3 IMPLANT

## 2019-12-30 NOTE — Anesthesia Postprocedure Evaluation (Signed)
Anesthesia Post Note  Patient: Michael Blackwell  Procedure(s) Performed: INSERTION OF right  arm ARTERIOVENOUS (AV) GORE-TEX GRAFT (Right Arm Upper)     Patient location during evaluation: PACU Anesthesia Type: MAC Level of consciousness: awake and alert Pain management: pain level controlled Vital Signs Assessment: post-procedure vital signs reviewed and stable Respiratory status: spontaneous breathing, nonlabored ventilation, respiratory function stable and patient connected to nasal cannula oxygen Cardiovascular status: stable and blood pressure returned to baseline Postop Assessment: no apparent nausea or vomiting Anesthetic complications: no    Last Vitals:  Vitals:   12/30/19 0951 12/30/19 1003  BP: 123/66 131/70  Pulse: 66 66  Resp: 19 (!) 26  Temp:  36.4 C  SpO2: 99% 100%    Last Pain:  Vitals:   12/30/19 1003  TempSrc:   PainSc: 0-No pain                 Effie Berkshire

## 2019-12-30 NOTE — Op Note (Signed)
OPERATIVE NOTE   PROCEDURE:  right upper arm arteriovenous graft (4 mm x 7 mm Goretex stretch)   PRE-OPERATIVE DIAGNOSIS: end stage renal disease   POST-OPERATIVE DIAGNOSIS: same  SURGEON: [name], MD  ASSISTANT(S): Arlee Muslim, PA  ANESTHESIA: MAC  ESTIMATED BLOOD LOSS: <50 cc  FINDING(S): 1. Palpable thrill at end of case 2. Palpable radial pulse at end of case.  SPECIMEN(S):  None  INDICATIONS:   Michael Blackwell is a 79 y.o. male who presents with ESRD and need for permanent hemodialysis access.  He has a thrombosed basilic vein fistula in the right arm.  He is scheduled for right arm AV graft today.  Risk, benefits, and alternatives to access surgery were discussed.  The patient is aware the risks include but are not limited to: bleeding, infection, steal syndrome, nerve damage, ischemic monomelic neuropathy, failure to mature, and need for additional procedures.  The patient is aware of the risks and elects to proceed forward.  DESCRIPTION: After full informed written consent was obtained from the patient, the patient was brought back to the operating room and placed supine upon the operating table.  The patient was given IV antibiotics prior to proceeding.  After obtaining adequate sedation, the patient was prepped and draped in standard fashion for a right arm access procedure.  I evaluated the brachial veins at the antecubitum and they were too small for forearm loop graft.  The plan was then upper arm graft.  I turned my attention first to the antecubitum.  Under ultrasound guidance, I identified the location of the brachial artery and marked it on the skin.  I then looked on the upper arm near the axilla and marked the brachial vein as well as axillary vein. I injected 1% lidocaine without epinephrine at each location to obtain some anesthesia.  In total, I used 10 mL of lidocaine to obtain anesthesia.  I made a longitudinal incision over the brachial artery  above the antecubitum and another longitudinal incision over the brachial vein near the axillary vein.  I dissected down through the subcutaneous tissue and  fascia carefully and was able to dissect out the brachial artery.  The artery was about 4.0 mm externally.  It was controlled proximally and distally with vessel loops.  I then dissected out the larger brachial vein through the upper arm incision.  Externally, it appeared to be 4-5 mm in diameter.  I then dissected this vein proximally and distal.  I took a metal Gore tunneler and dissected from the axillary incision incision to the antecubital incision.  Then I delivered the 4 x 7-mm stretch Gore-Tex graft, through this metal tunneler and then pulled out the metal tunneler leaving the graft in place.  The 4-mm end was left on the brachial artery side and the 7-mm end toward the vein side.  I then gave the patient 5,000 units of IV heparin to gain anticoagulation.  After waiting 2 minutes, I placed the brachial artery under tension proximally and distally with vessel loops, made an arteriotomy and extended it with a Potts scissor.  I sewed the 4-mm end of the graft to this arteriotomy with a running stitch of 6-0 Prolene.  At this point, then I completed the anastomosis in the usual fashion.  I released the vessel loops on the inflow and allowed the artery to decompress through the graft. There was good pulsatile bleeding through this graft.  I clamped the graft near its arterial anastomosis and  sucked out all the blood in the graft and loaded the graft with heparinized saline.  At this point, I pulled the graft to appropriate length and reset my exposure of the high brachial vein. The vein was controlled with Henley clamps and opened with 11 blade scalpel and extended with Potts scissors.  There was good venous backbleeding from the vein. I spatulated the graft to facilitate an end-to-side anastomosis.  In the process of spatulating, I cut the graft to  appropriate length for this anastomosis.  This graft was sewn to the vein in an end-to-side configuration with a 6-0 Prolene.  Prior to completing this anastomosis, I allowed the vein to back bleed and then I also allowed the artery to bleed in an antegrade fashion.  I completed this anastomosis in the usual fashion.  I irrigated out both incisions.  The graft had a good palpable thrill.  The radial artery had a palpable pulse.  The subcutaneous tissue in each incision was reapproximated with a running stitch of 3-0 Vicryl.  The skin was then reapproximated with a running subcuticular 4-0 Monocryl.  The skin was then cleaned, dried, and Dermabond used to reinforce the skin closure.   COMPLICATIONS: None  CONDITION: Stable  [name], MD [facility]: [phone]   12/30/2019, 9:13 AM

## 2019-12-30 NOTE — Anesthesia Procedure Notes (Signed)
Procedure Name: MAC Date/Time: 12/30/2019 7:58 AM Performed by: Imagene Riches, CRNA Pre-anesthesia Checklist: Patient identified, Emergency Drugs available, Suction available, Patient being monitored and Timeout performed Patient Re-evaluated:Patient Re-evaluated prior to induction Oxygen Delivery Method: Simple face mask Ventilation: Oral airway inserted - appropriate to patient size

## 2019-12-30 NOTE — H&P (Signed)
History and Physical Interval Note:  12/30/2019 7:25 AM  Michael Blackwell  has presented today for surgery, with the diagnosis of END STAGE RENAL DISEASE.  The various methods of treatment have been discussed with the patient and family. After consideration of risks, benefits and other options for treatment, the patient has consented to  Procedure(s): INSERTION OF ARTERIOVENOUS (AV) GORE-TEX GRAFT ARM (Right) as a surgical intervention.  The patient's history has been reviewed, patient examined, no change in status, stable for surgery.  I have reviewed the patient's chart and labs.  Questions were answered to the patient's satisfaction.    Insertion of right arm AVG.  [name]  Patient ID: Michael Blackwell, male DOB: 06/20/1941, 79 y.o. MRN: [id number]  Reason for Consult: Follow-up   Referred by Michael Blocker, MD  Subjective:  Subjective  HPI:  Michael Blackwell is a 79 y.o. male on chronic intermittent hemo-dialysis via right IJ tunneled dialysis catheter. Disposed right basilic vein fistula that occluded. The patient is left-handed. He was seen in the office approximately 10 days ago and discussed options with Dr. Trula Slade. He has since considered his options and wishes to proceed with right upper extremity access. Today, he denies hand pain, numbness, fever or chills he takes a daily aspirin      Past Medical History:  Diagnosis Date  . Acute respiratory failure with hypoxia ([place]) 12/05/2016   Michael Blackwell 12/05/2016  . Anemia   . Anxiety   . Arthritis    "joints ache at night sometimes" (3/8/20180  . Chronic kidney disease (CKD), stage II (mild)    Acute on chronic kidney disease stage II-III/notes 12/06/2016  . Dyspnea    with exertion  . Heart murmur    never has caused any problems per patient  . History of blood transfusion   . History of radiation therapy 02/25/14- 04/23/14   prostate 7800 cGy 40 sessions, seminal vesicles 5600 cGy 40 sessions  . Hypercholesteremia   .  Hypertension   . OSA on CPAP   . Pneumonia 12/06/2016  . Prostate cancer ([place]) 11/19/13   gleason 4+3=7, volume 34.74 cc  . Spermatocele    right  . Type II diabetes mellitus (HCC)    diet controlled  . Wears partial dentures    upper        Family History  Problem Relation Age of Onset  . Cancer Brother    brain  . Cancer Mother    stomach  . Cancer Father    ? prostate        Past Surgical History:  Procedure Laterality Date  . AV FISTULA PLACEMENT Right 03/04/2019   Procedure: RIGHT ARM ARTERIOVENOUS FISTULA CREATION; Surgeon: [name], MD; Location: [place]; Service: Vascular; Laterality: Right;  . BASCILIC VEIN TRANSPOSITION Right 05/26/2019   Procedure: Right Arm BASILIC VEIN TRANSPOSITION SECOND STAGE; Surgeon: [name], MD; Location: [place]; Service: Vascular; Laterality: Right;  . COLONOSCOPY    . LOWER EXTREMITY ANGIOGRAPHY Bilateral 02/25/2018   Procedure: Lower Extremity Angiography; Surgeon: [name], MD; Location: [facility]; Service: Cardiovascular; Laterality: Bilateral; limited  . PROSTATE BIOPSY  11/19/13   Gleason 4+3=7, vol 34.74 cc  . RENAL ANGIOGRAPHY N/A 02/25/2018   Procedure: RENAL ANGIOGRAPHY; Surgeon: [name], MD; Location: [facility]; Service: Cardiovascular; Laterality: N/A;  . TONSILLECTOMY     as child   Short Social History:  Social History  Tobacco Use  . Smoking status: Former Smoker    Years: 48.00    Types: Cigarettes    Quit date: 07/01/2018    Years since quitting: 1.4  . Smokeless tobacco: Never Used  . Tobacco comment: "I smoke 1 cig a month"- per patient on 05/25/19  Substance Use Topics  . Alcohol use: Not Currently   No Known Allergies        Current Outpatient Medications  Medication Sig Dispense Refill  . aspirin 81 MG tablet Take 81 mg by mouth at bedtime.     . bismuth subsalicylate (PEPTO BISMOL) 262 MG/15ML suspension Take 30 mLs by mouth  every 6 (six) hours as needed for indigestion.    . calcitRIOL (ROCALTROL) 0.25 MCG capsule Take 0.25 mcg by mouth daily.    . Calcium Carb-Cholecalciferol (CALCIUM 600+D) 600-800 MG-UNIT TABS Take 1 tablet by mouth 2 (two) times a day.    . carvedilol (COREG) 25 MG tablet Take 25 mg by mouth 2 (two) times daily.     . cholecalciferol (VITAMIN D3) 25 MCG (1000 UT) tablet Take 1,000 Units by mouth daily.    . cloNIDine (CATAPRES) 0.3 MG tablet Take 0.3 mg by mouth 2 (two) times daily.     . Ferrous Sulfate (IRON) 325 (65 Fe) MG TABS Take 1 tablet by mouth twice daily (Patient taking differently: Take 325 mg by mouth in the morning and at bedtime. ) 180 tablet 0  . furosemide (LASIX) 80 MG tablet Take 2 tablets (160 mg total) by mouth 2 (two) times daily. 30 tablet 0  . hydrALAZINE (APRESOLINE) 100 MG tablet Take 100 mg by mouth 3 (three) times daily.     . Multiple Vitamins-Minerals (CENTRUM SILVER 50+MEN) TABS Take 1 tablet by mouth daily.    . Omega-3 Fatty Acids (OMEGA 3 PO) Take 1,040 mg by mouth 2 (two) times daily.     . phenylephrine (NEO-SYNEPHRINE COLD & SINUS) 1 % nasal spray Place 1 drop into both nostrils at bedtime as needed for congestion.     . rosuvastatin (CRESTOR) 10 MG tablet Take 1 tablet by mouth once daily (Patient taking differently: Take 10 mg by mouth daily. ) 90 tablet 0  . tamsulosin (FLOMAX) 0.4 MG CAPS capsule Take 1 capsule (0.4 mg total) by mouth every other day.     No current facility-administered medications for this visit.   REVIEW OF SYSTEMS see HPI  Objective:  Objective Expand by Default     Vitals:   12/18/19 1505  BP: (!) 157/70  Pulse: 75  Temp: 98.2 F (36.8 C)  TempSrc: Oral  SpO2: 99%  Weight: 177 lb 11.2 oz (80.6 kg)  Height: 5\' 8"  (1.727 m)   Body mass index is 27.02 kg/m.  Physical Exam  General appearance: The patient is a well-developed well-nourished male in no apparent distress  Cardiac heart rate and rhythm is regular    Respiratory: Nonlabored  Right upper extremity: Incisions well-healed 2+ radial pulse. 5 out of 5 right hand grip strength intact sensation  Data:  No new  Assessment/Plan:  Assessment  79 year old male on hemodialysis presents for permanent access. Per Dr. Stephens Shire note, we will proceed with right upper extremity AV loop graft either in the forearm or upper arm. I discussed the procedure with the patient and his questions are answered.  Barbie Banner, PA-C  [facility]  [facility] MD: Donzetta Matters

## 2019-12-30 NOTE — Anesthesia Preprocedure Evaluation (Addendum)
Anesthesia Evaluation  Patient identified by MRN, date of birth, ID band Patient awake    Reviewed: Allergy & Precautions, NPO status , Patient's Chart, lab work & pertinent test results  Airway Mallampati: I  TM Distance: >3 FB Neck ROM: Full    Dental  (+) Edentulous Upper, Dental Advisory Given, Poor Dentition   Pulmonary sleep apnea , former smoker,    breath sounds clear to auscultation       Cardiovascular hypertension, Pt. on medications + Peripheral Vascular Disease and +CHF   Rhythm:Regular Rate:Normal     Neuro/Psych Anxiety    GI/Hepatic negative GI ROS, Neg liver ROS,   Endo/Other  diabetes  Renal/GU      Musculoskeletal  (+) Arthritis ,   Abdominal Normal abdominal exam  (+)   Peds  Hematology negative hematology ROS (+)   Anesthesia Other Findings   Reproductive/Obstetrics                            Anesthesia Physical Anesthesia Plan  ASA: III  Anesthesia Plan: MAC   Post-op Pain Management:    Induction: Intravenous  PONV Risk Score and Plan: 2 and Ondansetron and Propofol infusion  Airway Management Planned: Natural Airway and Simple Face Mask  Additional Equipment: None  Intra-op Plan:   Post-operative Plan:   Informed Consent: I have reviewed the patients History and Physical, chart, labs and discussed the procedure including the risks, benefits and alternatives for the proposed anesthesia with the patient or authorized representative who has indicated his/her understanding and acceptance.       Plan Discussed with: CRNA  Anesthesia Plan Comments:        Anesthesia Quick Evaluation

## 2019-12-30 NOTE — Transfer of Care (Signed)
Immediate Anesthesia Transfer of Care Note  Patient: Michael Blackwell  Procedure(s) Performed: INSERTION OF right  arm ARTERIOVENOUS (AV) GORE-TEX GRAFT (Right Arm Upper)  Patient Location: PACU  Anesthesia Type:MAC  Level of Consciousness: drowsy  Airway & Oxygen Therapy: Patient Spontanous Breathing and Patient connected to nasal cannula oxygen  Post-op Assessment: Report given to RN and Post -op Vital signs reviewed and stable  Post vital signs: Reviewed and stable  Last Vitals:  Vitals Value Taken Time  BP 129/60 12/30/19 0936  Temp    Pulse 66 12/30/19 0937  Resp 21 12/30/19 0937  SpO2 100 % 12/30/19 0937  Vitals shown include unvalidated device data.  Last Pain:  Vitals:   12/30/19 0613  TempSrc: Oral  PainSc: 0-No pain      Patients Stated Pain Goal: 0 (08/17/34 6701)  Complications: No apparent anesthesia complications

## 2019-12-30 NOTE — Discharge Instructions (Signed)
° °  [facility] ° °Discharge Instructions ° °AV Fistula or Graft Surgery for Dialysis Access ° °Please refer to the following instructions for your post-procedure care. Your surgeon or physician assistant will discuss any changes with you. ° °Activity ° °You may drive the day following your surgery, if you are comfortable and no longer taking prescription pain medication. Resume full activity as the soreness in your incision resolves. ° °Bathing/Showering ° °You may shower after you go home. Keep your incision dry for 48 hours. Do not soak in a bathtub, hot tub, or swim until the incision heals completely. You may not shower if you have a hemodialysis catheter. ° °Incision Care ° °Clean your incision with mild soap and water after 48 hours. Pat the area dry with a clean towel. You do not need a bandage unless otherwise instructed. Do not apply any ointments or creams to your incision. You may have skin glue on your incision. Do not peel it off. It will come off on its own in about one week. Your arm may swell a bit after surgery. To reduce swelling use pillows to elevate your arm so it is above your heart. Your doctor will tell you if you need to lightly wrap your arm with an ACE bandage. ° °Diet ° °Resume your normal diet. There are not special food restrictions following this procedure. In order to heal from your surgery, it is CRITICAL to get adequate nutrition. Your body requires vitamins, minerals, and protein. Vegetables are the best source of vitamins and minerals. Vegetables also provide the perfect balance of protein. Processed food has little nutritional value, so try to avoid this. ° °Medications ° °Resume taking all of your medications. If your incision is causing pain, you may take over-the counter pain relievers such as acetaminophen (Tylenol). If you were prescribed a stronger pain medication, please be aware these medications can cause nausea and constipation. Prevent  nausea by taking the medication with a snack or meal. Avoid constipation by drinking plenty of fluids and eating foods with high amount of fiber, such as fruits, vegetables, and grains. Do not take Tylenol if you are taking prescription pain medications. ° ° ° ° °Follow up °Your surgeon may want to see you in the office following your access surgery. If so, this will be arranged at the time of your surgery. ° °Please call us immediately for any of the following conditions: ° °Increased pain, redness, drainage (pus) from your incision site °Fever of 101 degrees or higher °Severe or worsening pain at your incision site °Hand pain or numbness. ° °Reduce your risk of vascular disease: ° °Stop smoking. If you would like help, call [facility] at 1-800-QUIT-NOW (1-800-784-8669) or [facility] at 336-586-4000 ° °Manage your cholesterol °Maintain a desired weight °Control your diabetes °Keep your blood pressure down ° °Dialysis ° °It will take several weeks to several months for your new dialysis access to be ready for use. Your surgeon will determine when it is OK to use it. Your nephrologist will continue to direct your dialysis. You can continue to use your Permcath until your new access is ready for use. ° °If you have any questions, please call the office at [phone]. ° °

## 2019-12-31 ENCOUNTER — Encounter: Payer: Self-pay | Admitting: *Deleted

## 2020-01-18 ENCOUNTER — Telehealth (HOSPITAL_COMMUNITY): Payer: Self-pay

## 2020-01-18 NOTE — Telephone Encounter (Signed)

## 2020-01-19 ENCOUNTER — Other Ambulatory Visit: Payer: Self-pay

## 2020-01-19 ENCOUNTER — Ambulatory Visit (INDEPENDENT_AMBULATORY_CARE_PROVIDER_SITE_OTHER): Payer: Self-pay | Admitting: Physician Assistant

## 2020-01-19 VITALS — BP 133/53 | HR 63 | Temp 98.2°F | Resp 16 | Ht 68.0 in | Wt 179.0 lb

## 2020-01-19 DIAGNOSIS — N186 End stage renal disease: Secondary | ICD-10-CM

## 2020-01-19 DIAGNOSIS — Z992 Dependence on renal dialysis: Secondary | ICD-10-CM

## 2020-01-19 NOTE — Progress Notes (Signed)
    Postoperative Access Visit   History of Present Illness   Michael Blackwell is a 79 y.o. year old male who presents for postoperative follow-up for: right upper arm arteriovenous graft 12/30/19 by Dr. Carlis Abbott.  The patient's wounds are healed. He has noticed some swelling in area of distal incision. Says it is not tender. Has not noticed any redness or drainage. The patient notes no steal symptoms. He does get bilateral hand coldness while at dialysis. He currently dialyzes with right IJ TDC MWF at [facility] in [place].   Physical Examination   Vitals:   01/19/20 1311  BP: (!) 133/53  Pulse: 63  Resp: 16  Temp: 98.2 F (36.8 C)  SpO2: 98%  Weight: 179 lb (81.2 kg)  Height: 5\' 8"  (1.727 m)   Body mass index is 27.22 kg/m.   General:  Well nourished, well appearing, not in any distress Lungs: clear to auscultation bilaterally, non labored Cardiac: regular rate and rhythm right arm Incision is well healed, 2+ radial pulse, hand grip is 5/5, sensation in digits is intact, palpable thrill, bruit can be auscultated. Small area of swelling in the distal medial arm incision likely small seroma. Soft, non tender. No erythema, no warmth    Medical Decision Making   Michael Blackwell is a 79 y.o. year old male who presents s/p right upper arm arteriovenous graft   Patent is without signs or symptoms of steal syndrome  He does have a small seroma in the distal incision which is improving, no signs of infection. Recommend continued elevation of arm and to watch for signs of infection and follow up earlier if there are any concerns  The patient's access will be ready for use after 01/30/20  The patient's tunneled dialysis catheter can be removed when Nephrology is comfortable with the performance of the   The patient may follow up on a prn basis   Karoline Caldwell, PA-C [facility] [place] Office: [phone]  Clinic MD: Dr. Carlis Abbott

## 2020-01-27 ENCOUNTER — Other Ambulatory Visit: Payer: Self-pay | Admitting: Cardiology

## 2020-01-29 DIAGNOSIS — U071 COVID-19: Secondary | ICD-10-CM | POA: Insufficient documentation

## 2020-03-08 ENCOUNTER — Other Ambulatory Visit: Payer: Self-pay | Admitting: Cardiology

## 2020-03-15 IMAGING — CR DG CHEST 2V
2 series · 2 of 2 positions shown · non-contrast
Comparison: 12/05/2016.

CLINICAL DATA: Difficulty breathing. On dialysis. Status post COVID
vaccine today.

EXAM:
CHEST - 2 VIEW

[chest lat]
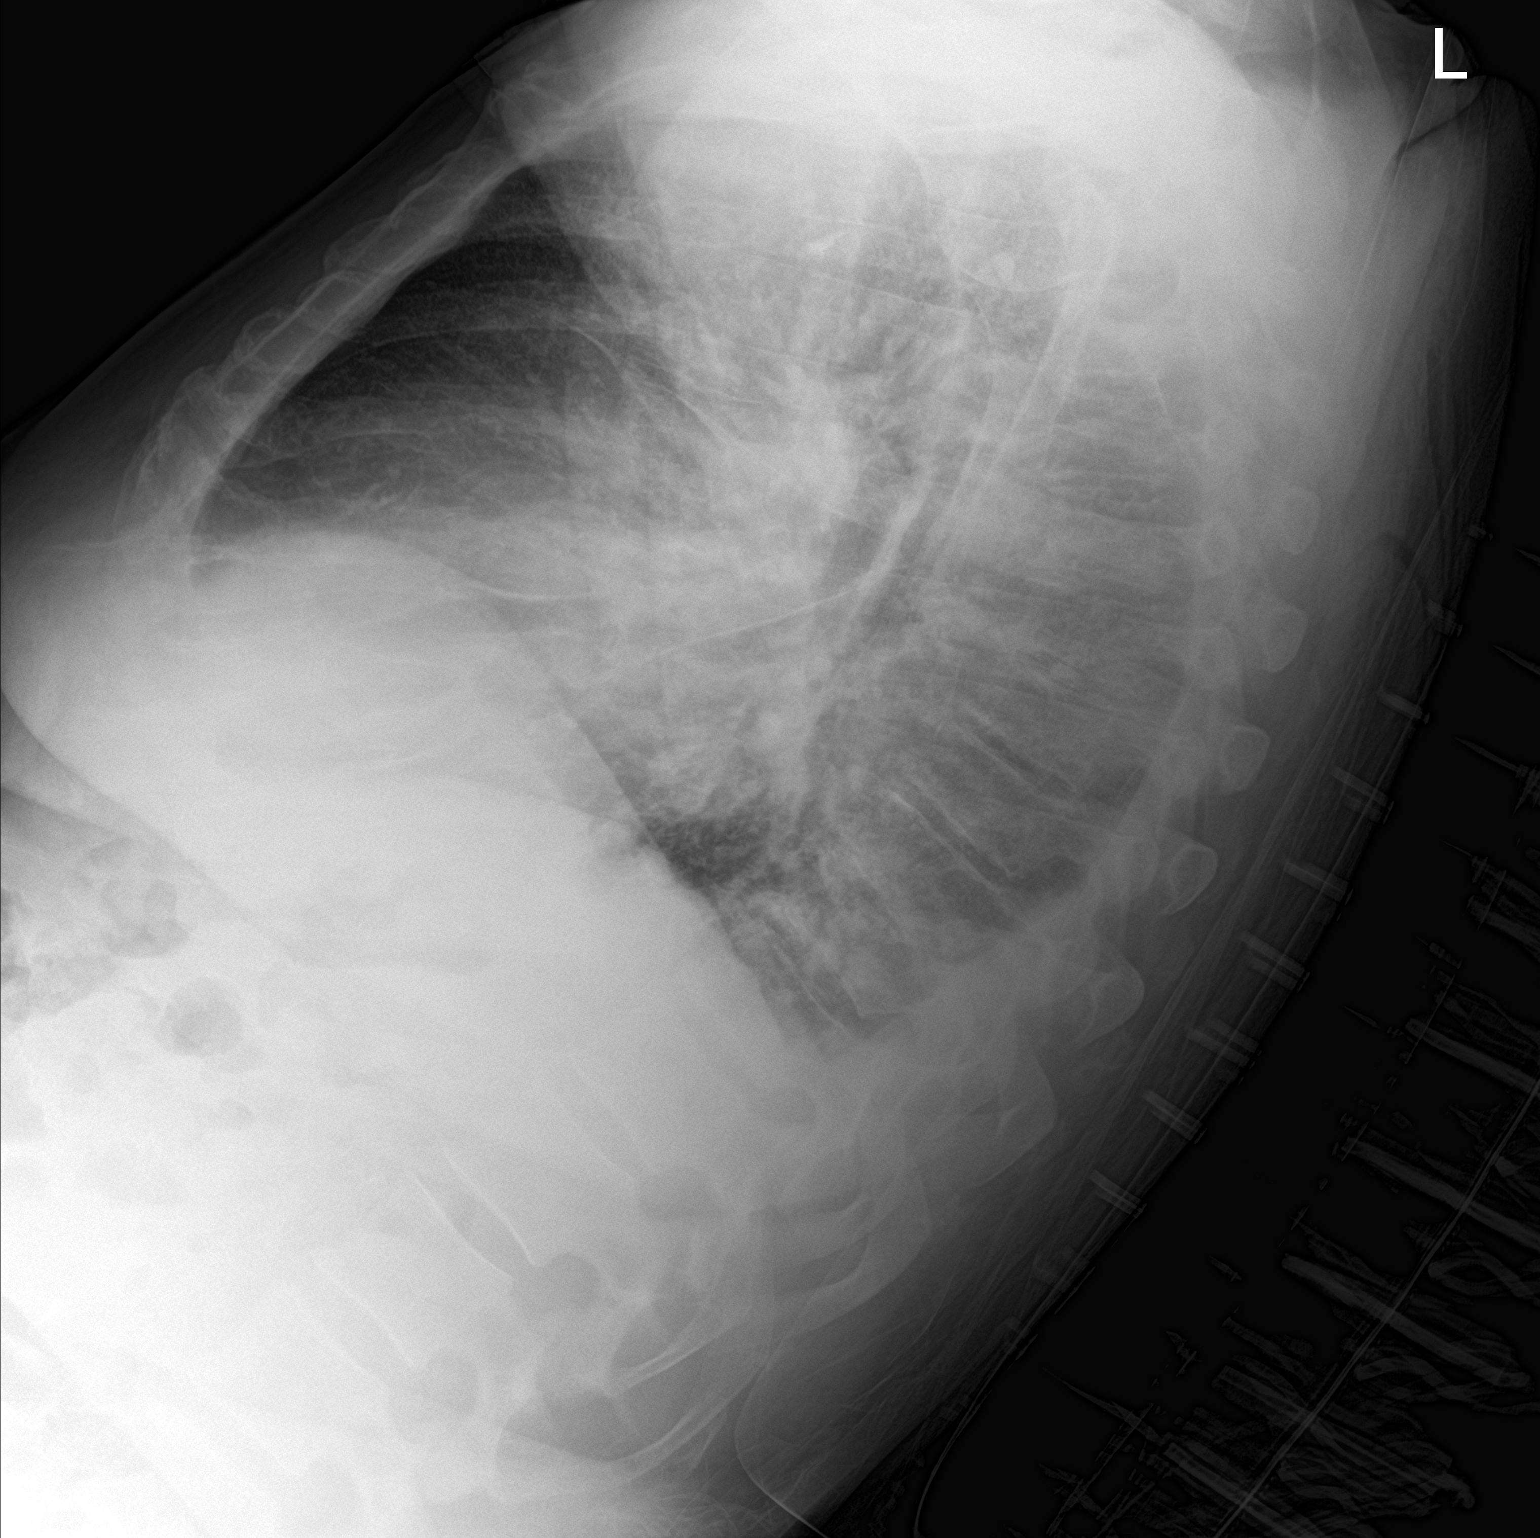

[chest ap]
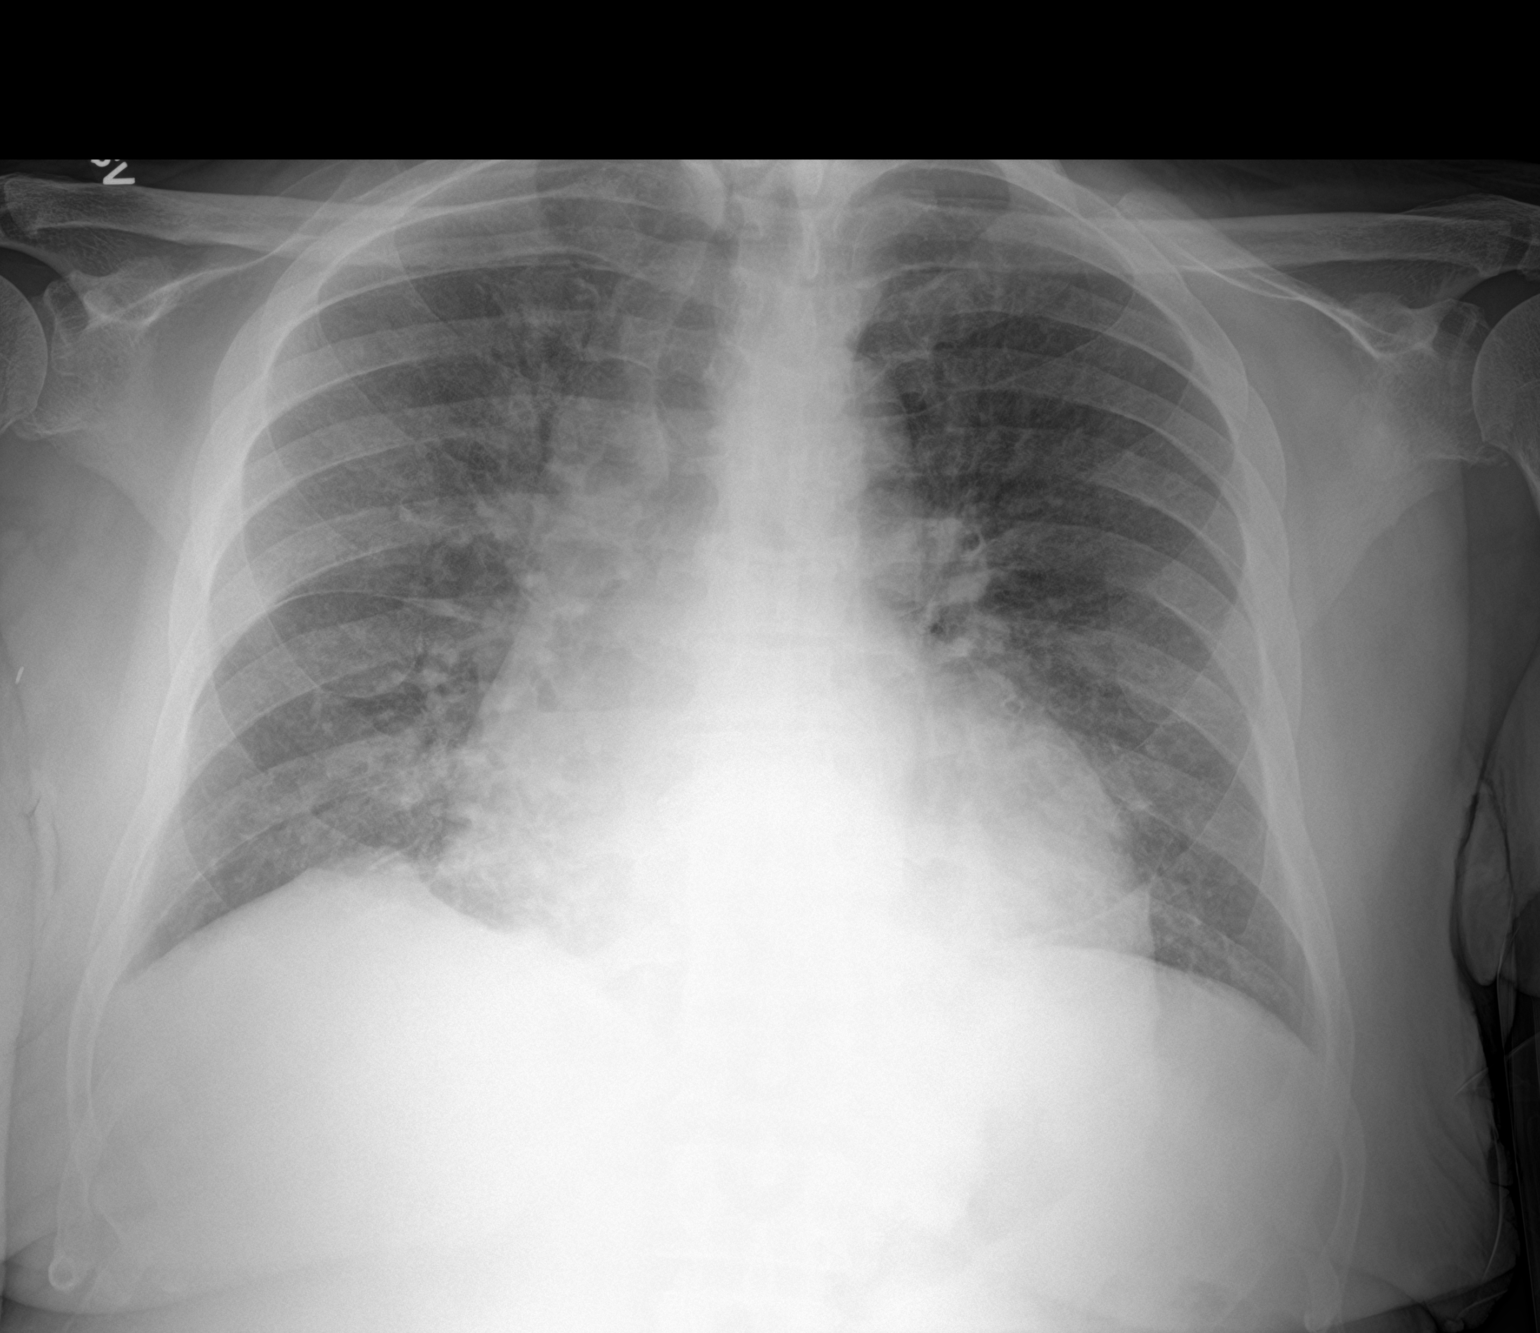

[2 of 2 positions shown; findings below may reference images not displayed]

FINDINGS: Pulmonary vascular congestion with suspected mild interstitial
edema. No definite pleural effusions. No pneumothorax.

Cardiomegaly.

Visualized osseous structures are within normal limits.
IMPRESSION: Cardiomegaly with suspected mild interstitial edema. No definite
pleural effusions.

## 2020-03-17 IMAGING — DX DG CHEST 2V
2 series · 2 of 2 positions shown · non-contrast
Comparison: 01/12/2020

CLINICAL DATA: Pulmonary edema.

EXAM:
CHEST - 2 VIEW

[chest pa]
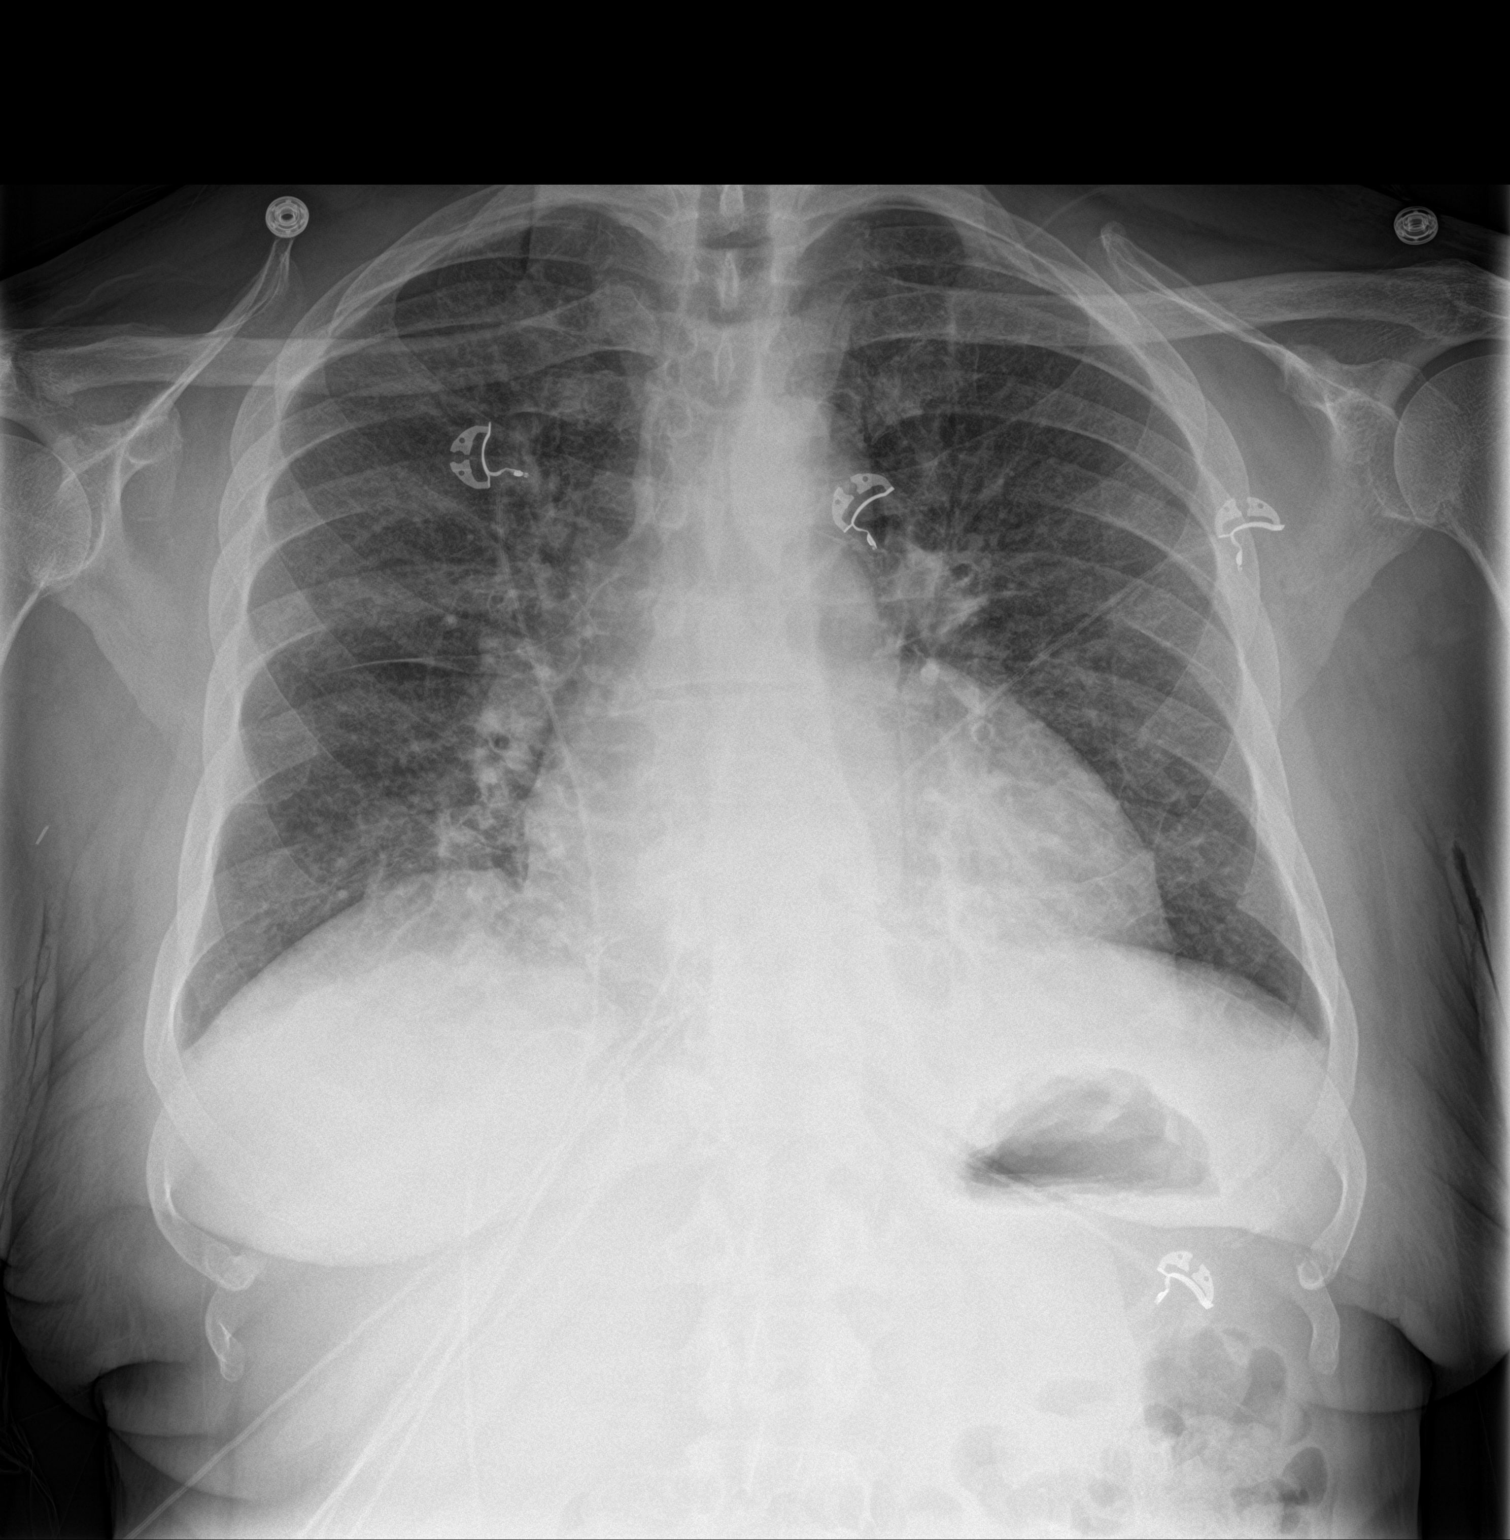

[chest lat]
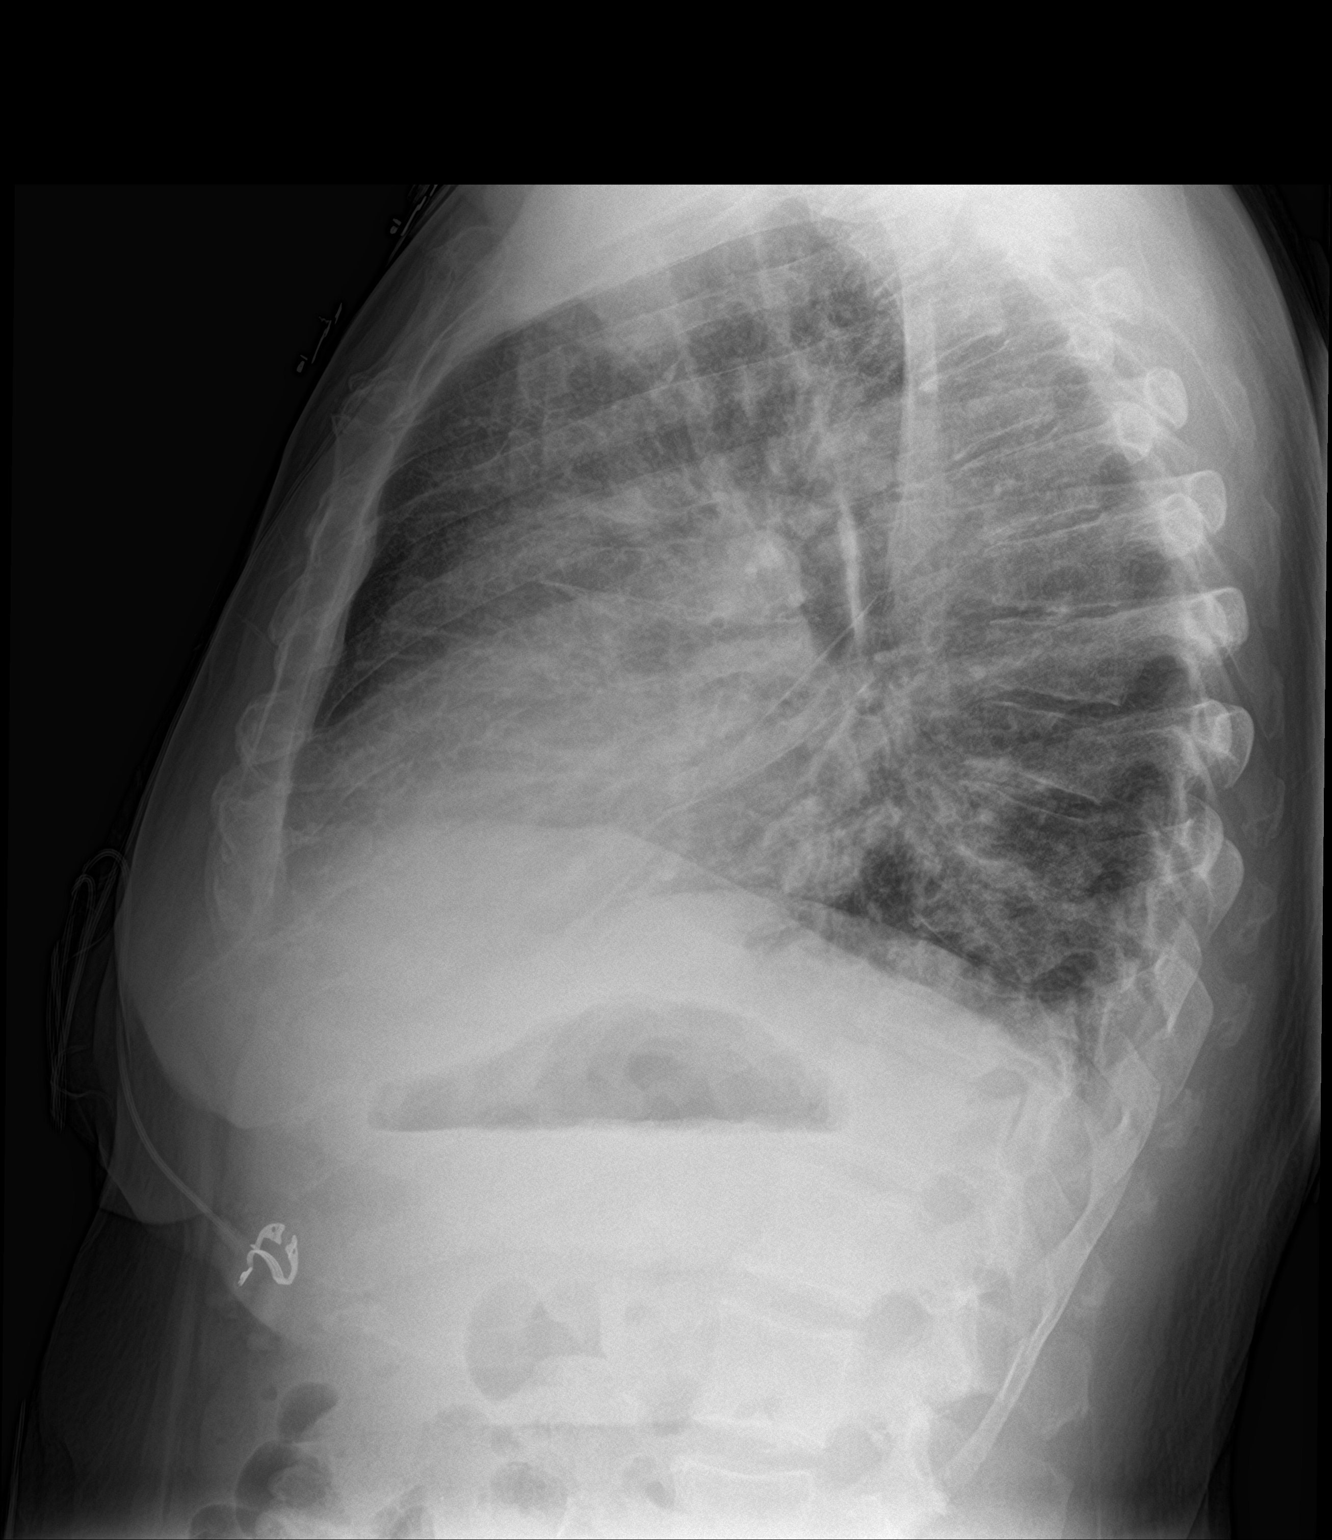

[2 of 2 positions shown; findings below may reference images not displayed]

FINDINGS: There is persistent mild cardiomegaly. There is persistent
accentuation of the interstitial markings bilaterally with
peribronchial thickening. No pleural effusions. No significant bone
abnormality.
IMPRESSION: Persistent mild cardiomegaly. Persistent accentuation of the
interstitial markings with peribronchial thickening consistent with
mild pulmonary edema.

## 2020-04-20 DIAGNOSIS — T782XXA Anaphylactic shock, unspecified, initial encounter: Secondary | ICD-10-CM | POA: Insufficient documentation

## 2020-04-20 DIAGNOSIS — T7840XA Allergy, unspecified, initial encounter: Secondary | ICD-10-CM | POA: Insufficient documentation

## 2020-06-10 ENCOUNTER — Encounter (HOSPITAL_COMMUNITY): Payer: Medicare Other

## 2020-06-13 ENCOUNTER — Other Ambulatory Visit: Payer: Self-pay | Admitting: Cardiology

## 2020-06-13 ENCOUNTER — Other Ambulatory Visit (HOSPITAL_COMMUNITY): Payer: Self-pay | Admitting: Internal Medicine

## 2020-06-13 DIAGNOSIS — R0989 Other specified symptoms and signs involving the circulatory and respiratory systems: Secondary | ICD-10-CM

## 2020-06-14 ENCOUNTER — Ambulatory Visit (INDEPENDENT_AMBULATORY_CARE_PROVIDER_SITE_OTHER)
Admission: RE | Admit: 2020-06-14 | Discharge: 2020-06-14 | Disposition: A | Discharge status: Home / Self Care | Payer: Medicare Other | Source: Ambulatory Visit | Attending: Vascular Surgery | Admitting: Vascular Surgery

## 2020-06-14 ENCOUNTER — Other Ambulatory Visit (HOSPITAL_COMMUNITY): Payer: Self-pay | Admitting: Internal Medicine

## 2020-06-14 ENCOUNTER — Other Ambulatory Visit: Payer: Self-pay

## 2020-06-14 ENCOUNTER — Ambulatory Visit (HOSPITAL_COMMUNITY)
Admission: RE | Admit: 2020-06-14 | Discharge: 2020-06-14 | Disposition: A | Discharge status: Home / Self Care | Payer: Medicare Other | Source: Ambulatory Visit | Attending: Internal Medicine | Admitting: Internal Medicine

## 2020-06-14 DIAGNOSIS — R0989 Other specified symptoms and signs involving the circulatory and respiratory systems: Secondary | ICD-10-CM | POA: Diagnosis not present

## 2020-06-14 DIAGNOSIS — R6889 Other general symptoms and signs: Secondary | ICD-10-CM | POA: Insufficient documentation

## 2020-06-21 ENCOUNTER — Other Ambulatory Visit: Payer: Self-pay

## 2020-06-21 MED ORDER — ROSUVASTATIN CALCIUM 10 MG PO TABS: 10.0000 mg | ORAL_TABLET | Freq: Every day | ORAL | 3 refills | Status: AC

## 2020-06-24 DIAGNOSIS — Z992 Dependence on renal dialysis: Secondary | ICD-10-CM | POA: Insufficient documentation

## 2020-06-24 DIAGNOSIS — Z79899 Other long term (current) drug therapy: Secondary | ICD-10-CM | POA: Insufficient documentation

## 2020-06-24 DIAGNOSIS — Z4932 Encounter for adequacy testing for peritoneal dialysis: Secondary | ICD-10-CM | POA: Insufficient documentation

## 2020-06-24 DIAGNOSIS — E878 Other disorders of electrolyte and fluid balance, not elsewhere classified: Secondary | ICD-10-CM | POA: Insufficient documentation

## 2020-06-24 DIAGNOSIS — R601 Generalized edema: Secondary | ICD-10-CM | POA: Insufficient documentation

## 2020-06-24 DIAGNOSIS — Z452 Encounter for adjustment and management of vascular access device: Secondary | ICD-10-CM | POA: Insufficient documentation

## 2020-06-24 DIAGNOSIS — R17 Unspecified jaundice: Secondary | ICD-10-CM | POA: Insufficient documentation

## 2020-06-30 ENCOUNTER — Other Ambulatory Visit: Payer: Self-pay

## 2020-06-30 ENCOUNTER — Ambulatory Visit (INDEPENDENT_AMBULATORY_CARE_PROVIDER_SITE_OTHER): Payer: Medicare Other | Admitting: Vascular Surgery

## 2020-06-30 ENCOUNTER — Encounter: Payer: Self-pay | Admitting: Vascular Surgery

## 2020-06-30 VITALS — BP 179/72 | HR 54 | Temp 98.2°F | Resp 20 | Ht 68.0 in | Wt 192.0 lb

## 2020-06-30 DIAGNOSIS — I701 Atherosclerosis of renal artery: Secondary | ICD-10-CM | POA: Diagnosis not present

## 2020-06-30 DIAGNOSIS — I739 Peripheral vascular disease, unspecified: Secondary | ICD-10-CM

## 2020-06-30 MED ORDER — SODIUM CHLORIDE 0.9 % IV SOLN: 250.0000 mL | INTRAVENOUS | Status: AC | PRN

## 2020-06-30 NOTE — H&P (View-Only) (Signed)
Referring Physician: Dr. Marlou Sa  Patient name: Michael Blackwell MRN: [id number] DOB: Sep 03, 1941 Sex: male  REASON FOR CONSULT: Right leg ischemia  HPI: Michael Blackwell is a 79 y.o. male, who recently was putting a heating pad on his right foot for pain in the right foot.  He then developed darkish discoloration of the right fourth toe.  He has a small ulceration on the dorsal aspect of the toe which is slowly healing.  He does not have rest pain or claudication.  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  He has previously been on hemodialysis but is transitioning over to peritoneal dialysis.  Other medical problems include diabetes, sleep apnea requiring CPAP.  He is on a statin and aspirin.  Past Medical History:  Diagnosis Date  . Acute respiratory failure with hypoxia ([place]) 12/05/2016   [name] 12/05/2016  . Anemia   . Anxiety   . Arthritis    "joints ache at night sometimes" (3/8/20180  . Chronic kidney disease (CKD), stage II (mild)    Acute on chronic kidney disease stage II-III/notes 12/06/2016  . Dyspnea    with exertion  . ESRD (end stage renal disease) ([place])    Hemo TTSAT [place]  . Heart murmur    never has caused any problems per patient  . History of blood transfusion   . History of radiation therapy 02/25/14- 04/23/14   prostate 7800 cGy 40 sessions, seminal vesicles 5600 cGy 40 sessions  . Hypercholesteremia   . Hypertension   . OSA on CPAP   . Pneumonia 12/06/2016  . Prostate cancer ([place]) 11/19/13   gleason 4+3=7, volume 34.74 cc  . Spermatocele    right  . Type II diabetes mellitus (HCC)    diet controlled  . Wears partial dentures    upper   Past Surgical History:  Procedure Laterality Date  . AV FISTULA PLACEMENT Right 03/04/2019   Procedure: RIGHT ARM ARTERIOVENOUS  FISTULA CREATION;  Surgeon: [name], MD;  Location: [facility];  Service: Vascular;  Laterality: Right;  . AV FISTULA PLACEMENT Right 12/30/2019   Procedure: INSERTION OF right  arm  ARTERIOVENOUS (AV) GORE-TEX GRAFT;  Surgeon: [name], MD;  Location: [place] [facility];  Service: Vascular;  Laterality: Right;  . BASCILIC VEIN TRANSPOSITION Right 05/26/2019   Procedure: Right Arm BASILIC VEIN TRANSPOSITION SECOND STAGE;  Surgeon: [name], MD;  Location: [facility];  Service: Vascular;  Laterality: Right;  . COLONOSCOPY    . LOWER EXTREMITY ANGIOGRAPHY Bilateral 02/25/2018   Procedure: Lower Extremity Angiography;  Surgeon: [name], MD;  Location: [facility];  Service: Cardiovascular;  Laterality: Bilateral;  limited  . PROSTATE BIOPSY  11/19/13   Gleason 4+3=7, vol 34.74 cc  . RENAL ANGIOGRAPHY N/A 02/25/2018   Procedure: RENAL ANGIOGRAPHY;  Surgeon: [name], MD;  Location: [facility];  Service: Cardiovascular;  Laterality: N/A;  . TONSILLECTOMY     as child    Family History  Problem Relation Age of Onset  . Cancer Brother        brain  . Cancer Mother        stomach  . Cancer Father        ? prostate    SOCIAL HISTORY: Social History   Socioeconomic History  . Marital status: Married    Spouse name: Not on file  . Number of children: Not on file  . Years of education: Not on file  .  Highest education level: Not on file  Occupational History  . Not on file  Tobacco Use  . Smoking status: Former Smoker    Years: 48.00    Types: Cigarettes    Quit date: 07/02/2019    Years since quitting: 0.9  . Smokeless tobacco: Never Used  . Tobacco comment: "I smoke 1 cig a month"- per patient on 05/25/19  Vaping Use  . Vaping Use: Never used  Substance and Sexual Activity  . Alcohol use: Not Currently  . Drug use: No  . Sexual activity: Yes  Other Topics Concern  . Not on file  Social History Narrative  . Not on file   Social Determinants of Health   Financial Resource Strain:   . Difficulty of Paying Living Expenses: Not on file  Food Insecurity:   . Worried About Charity fundraiser in the Last Year:  Not on file  . Ran Out of Food in the Last Year: Not on file  Transportation Needs:   . Lack of Transportation (Medical): Not on file  . Lack of Transportation (Non-Medical): Not on file  Physical Activity:   . Days of Exercise per Week: Not on file  . Minutes of Exercise per Session: Not on file  Stress:   . Feeling of Stress : Not on file  Social Connections:   . Frequency of Communication with Friends and Family: Not on file  . Frequency of Social Gatherings with Friends and Family: Not on file  . Attends Religious Services: Not on file  . Active Member of Clubs or Organizations: Not on file  . Attends Archivist Meetings: Not on file  . Marital Status: Not on file  Intimate Partner Violence:   . Fear of Current or Ex-Partner: Not on file  . Emotionally Abused: Not on file  . Physically Abused: Not on file  . Sexually Abused: Not on file    No Known Allergies  Current Outpatient Medications  Medication Sig Dispense Refill  . amLODipine (NORVASC) 10 MG tablet Take 10 mg by mouth at bedtime.    Marland Kitchen aspirin EC 81 MG tablet Take 81 mg by mouth at bedtime.    . bismuth subsalicylate (PEPTO BISMOL) 262 MG/15ML suspension Take 30 mLs by mouth every 6 (six) hours as needed for indigestion.    . Calcium Carb-Cholecalciferol (CALCIUM 600+D) 600-800 MG-UNIT TABS Take 1 tablet by mouth 2 (two) times a day.    . cholecalciferol (VITAMIN D3) 25 MCG (1000 UT) tablet Take 1,000 Units by mouth daily.    . cloNIDine (CATAPRES) 0.3 MG tablet Take 0.6 mg by mouth 2 (two) times daily.     . Cyanocobalamin (B-12) 2500 MCG TABS Take 2,500 mcg by mouth daily.    . Ferrous Sulfate (IRON) 325 (65 Fe) MG TABS Take 1 tablet by mouth twice daily (Patient taking differently: Take 325 mg by mouth in the morning and at bedtime. ) 180 tablet 0  . hydrALAZINE (APRESOLINE) 100 MG tablet Take 100 mg by mouth 3 (three) times daily.     . isosorbide mononitrate (IMDUR) 30 MG 24 hr tablet Take 30 mg by  mouth every evening.     . multivitamin (RENA-VIT) TABS tablet Take 1 tablet by mouth daily.    . Omega-3 Fatty Acids (OMEGA 3 PO) Take 1,040 mg by mouth 2 (two) times daily.     . phenylephrine (NEO-SYNEPHRINE COLD & SINUS) 1 % nasal spray Place 1 drop into both nostrils at bedtime  as needed for congestion.     . Potassium 99 MG TABS Take 99 mg by mouth daily.    . potassium chloride SA (KLOR-CON M20) 20 MEQ tablet Take 20 mEq by mouth daily.    . rosuvastatin (CRESTOR) 10 MG tablet Take 1 tablet (10 mg total) by mouth daily. 90 tablet 3  . tamsulosin (FLOMAX) 0.4 MG CAPS capsule Take 1 capsule (0.4 mg total) by mouth every other day. (Patient taking differently: Take 0.4 mg by mouth at bedtime. )    . vitamin E (VITAMIN E) 180 MG (400 UNITS) capsule Take 400 Units by mouth in the morning and at bedtime.     No current facility-administered medications for this visit.    ROS:   General:  No weight loss, Fever, chills  HEENT: No recent headaches, no nasal bleeding, no visual changes, no sore throat  Neurologic: No dizziness, blackouts, seizures. No recent symptoms of stroke or mini- stroke. No recent episodes of slurred speech, or temporary blindness.  Cardiac: No recent episodes of chest pain/pressure, no shortness of breath at rest.  No shortness of breath with exertion.  Denies history of atrial fibrillation or irregular heartbeat  Vascular: No history of rest pain in feet.  No history of claudication.  + history of non-healing ulcer, No history of DVT   Pulmonary: No home oxygen, no productive cough, no hemoptysis,  No asthma or wheezing  Musculoskeletal:  [ ]  Arthritis, [ ]  Low back pain,  [ ]  Joint pain  Hematologic:No history of hypercoagulable state.  No history of easy bleeding.  No history of anemia  Gastrointestinal: No hematochezia or melena,  No gastroesophageal reflux, no trouble swallowing  Urinary: [X]  chronic Kidney disease, [ ]  on HD - [ ]  MWF or [ ]  TTHS, [ ]   Burning with urination, [ ]  Frequent urination, [ ]  Difficulty urinating;   Skin: No rashes  Psychological: No history of anxiety,  No history of depression   Physical Examination  Vitals:   06/30/20 1021  BP: (!) 179/72  Pulse: (!) 54  Resp: 20  Temp: 98.2 F (36.8 C)  SpO2: 96%  Weight: 192 lb (87.1 kg)  Height: 5\' 8"  (1.727 m)    Body mass index is 29.19 kg/m.  General:  Alert and oriented, no acute distress HEENT: Normal Neck: no JVD Cardiac: Regular Rate and Rhythm Abdomen: Soft, non-tender, non-distended, no mass, right lower quadrant PD catheter Skin: No rash, dark discoloration right fourth toe with dry ulcer 2 mm diameter dorsal aspect of toe plantar aspect of feet and toes are pink Extremity Pulses: Palpable graft pulse right upper arm graft, absent femoral, dorsalis pedis, posterior tibial pulses bilaterally I suspect secondary to calcification Musculoskeletal: No deformity or edema  Neurologic: Upper and lower extremity motor 5/5 and symmetric  DATA:  Patient had a right lower extremity duplex exam today which shows a 70% stenosis of the right common femoral and right superficial femoral artery.  He also had bilateral ABIs performed.  Left side was biphasic but noncompressible vessels.  Toe pressure on the left was 63.  Right side was 0.7 with monophasic waveforms and a toe pressure of 39.  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  Patient has evidence on duplex ultrasound of right common femoral and superficial femoral artery occlusive disease.   PLAN: Aortogram with bilateral lower extremity runoff possible intervention by Dr. Donzetta Matters on Monday, October  11.  I am away that week and the patient wanted to delay his arteriogram due to current ongoing peritoneal dialysis training.  He has seen Dr. Donzetta Matters previously for his dialysis access.  Risk benefits  possible complications and procedure details were discussed with patient today including but not limited to bleeding infection vessel injury contrast reaction.  He understands and agrees to proceed.  Ruta Hinds, MD [facility] Office: [phone]

## 2020-06-30 NOTE — Progress Notes (Signed)
Referring Physician: Dr. Marlou Sa  Patient name: Michael Blackwell MRN: [id number] DOB: [birth year] Sex: male  REASON FOR CONSULT: Right leg ischemia  HPI: Michael Blackwell is a 79 y.o. male, who recently was putting a heating pad on his right foot for pain in the right foot.  He then developed darkish discoloration of the right fourth toe.  He has a small ulceration on the dorsal aspect of the toe which is slowly healing.  He does not have rest pain or claudication.  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  He has previously been on hemodialysis but is transitioning over to peritoneal dialysis.  Other medical problems include diabetes, sleep apnea requiring CPAP.  He is on a statin and aspirin.  Past Medical History:  Diagnosis Date  . Acute respiratory failure with hypoxia ([place]) 12/05/2016   [name] 12/05/2016  . Anemia   . Anxiety   . Arthritis    "joints ache at night sometimes" (3/8/20180  . Chronic kidney disease (CKD), stage II (mild)    Acute on chronic kidney disease stage II-III/notes 12/06/2016  . Dyspnea    with exertion  . ESRD (end stage renal disease) ([place])    Hemo TTSAT [place]  . Heart murmur    never has caused any problems per patient  . History of blood transfusion   . History of radiation therapy 02/25/14- 04/23/14   prostate 7800 cGy 40 sessions, seminal vesicles 5600 cGy 40 sessions  . Hypercholesteremia   . Hypertension   . OSA on CPAP   . Pneumonia 12/06/2016  . Prostate cancer ([place]) 11/19/13   gleason 4+3=7, volume 34.74 cc  . Spermatocele    right  . Type II diabetes mellitus (HCC)    diet controlled  . Wears partial dentures    upper   Past Surgical History:  Procedure Laterality Date  . AV FISTULA PLACEMENT Right 03/04/2019   Procedure: RIGHT ARM ARTERIOVENOUS  FISTULA CREATION;  Surgeon: [name], MD;  Location: [facility];  Service: Vascular;  Laterality: Right;  . AV FISTULA PLACEMENT Right 12/30/2019   Procedure: INSERTION OF right  arm  ARTERIOVENOUS (AV) GORE-TEX GRAFT;  Surgeon: [name], MD;  Location: [facility];  Service: Vascular;  Laterality: Right;  . BASCILIC VEIN TRANSPOSITION Right 05/26/2019   Procedure: Right Arm BASILIC VEIN TRANSPOSITION SECOND STAGE;  Surgeon: [name], MD;  Location: [facility];  Service: Vascular;  Laterality: Right;  . COLONOSCOPY    . LOWER EXTREMITY ANGIOGRAPHY Bilateral 02/25/2018   Procedure: Lower Extremity Angiography;  Surgeon: [name], MD;  Location: [facility];  Service: Cardiovascular;  Laterality: Bilateral;  limited  . PROSTATE BIOPSY  11/19/13   Gleason 4+3=7, vol 34.74 cc  . RENAL ANGIOGRAPHY N/A 02/25/2018   Procedure: RENAL ANGIOGRAPHY;  Surgeon: [name], MD;  Location: [facility];  Service: Cardiovascular;  Laterality: N/A;  . TONSILLECTOMY     as child    Family History  Problem Relation Age of Onset  . Cancer Brother        brain  . Cancer Mother        stomach  . Cancer Father        ? prostate    SOCIAL HISTORY: Social History   Socioeconomic History  . Marital status: Married    Spouse name: Not on file  . Number of children: Not on file  . Years of education: Not on file  .  Highest education level: Not on file  Occupational History  . Not on file  Tobacco Use  . Smoking status: Former Smoker    Years: 48.00    Types: Cigarettes    Quit date: 07/02/2019    Years since quitting: 0.9  . Smokeless tobacco: Never Used  . Tobacco comment: "I smoke 1 cig a month"- per patient on 05/25/19  Vaping Use  . Vaping Use: Never used  Substance and Sexual Activity  . Alcohol use: Not Currently  . Drug use: No  . Sexual activity: Yes  Other Topics Concern  . Not on file  Social History Narrative  . Not on file   Social Determinants of Health   Financial Resource Strain:   . Difficulty of Paying Living Expenses: Not on file  Food Insecurity:   . Worried About Charity fundraiser in the Last Year:  Not on file  . Ran Out of Food in the Last Year: Not on file  Transportation Needs:   . Lack of Transportation (Medical): Not on file  . Lack of Transportation (Non-Medical): Not on file  Physical Activity:   . Days of Exercise per Week: Not on file  . Minutes of Exercise per Session: Not on file  Stress:   . Feeling of Stress : Not on file  Social Connections:   . Frequency of Communication with Friends and Family: Not on file  . Frequency of Social Gatherings with Friends and Family: Not on file  . Attends Religious Services: Not on file  . Active Member of Clubs or Organizations: Not on file  . Attends Archivist Meetings: Not on file  . Marital Status: Not on file  Intimate Partner Violence:   . Fear of Current or Ex-Partner: Not on file  . Emotionally Abused: Not on file  . Physically Abused: Not on file  . Sexually Abused: Not on file    No Known Allergies  Current Outpatient Medications  Medication Sig Dispense Refill  . amLODipine (NORVASC) 10 MG tablet Take 10 mg by mouth at bedtime.    Marland Kitchen aspirin EC 81 MG tablet Take 81 mg by mouth at bedtime.    . bismuth subsalicylate (PEPTO BISMOL) 262 MG/15ML suspension Take 30 mLs by mouth every 6 (six) hours as needed for indigestion.    . Calcium Carb-Cholecalciferol (CALCIUM 600+D) 600-800 MG-UNIT TABS Take 1 tablet by mouth 2 (two) times a day.    . cholecalciferol (VITAMIN D3) 25 MCG (1000 UT) tablet Take 1,000 Units by mouth daily.    . cloNIDine (CATAPRES) 0.3 MG tablet Take 0.6 mg by mouth 2 (two) times daily.     . Cyanocobalamin (B-12) 2500 MCG TABS Take 2,500 mcg by mouth daily.    . Ferrous Sulfate (IRON) 325 (65 Fe) MG TABS Take 1 tablet by mouth twice daily (Patient taking differently: Take 325 mg by mouth in the morning and at bedtime. ) 180 tablet 0  . hydrALAZINE (APRESOLINE) 100 MG tablet Take 100 mg by mouth 3 (three) times daily.     . isosorbide mononitrate (IMDUR) 30 MG 24 hr tablet Take 30 mg by  mouth every evening.     . multivitamin (RENA-VIT) TABS tablet Take 1 tablet by mouth daily.    . Omega-3 Fatty Acids (OMEGA 3 PO) Take 1,040 mg by mouth 2 (two) times daily.     . phenylephrine (NEO-SYNEPHRINE COLD & SINUS) 1 % nasal spray Place 1 drop into both nostrils at bedtime  as needed for congestion.     . Potassium 99 MG TABS Take 99 mg by mouth daily.    . potassium chloride SA (KLOR-CON M20) 20 MEQ tablet Take 20 mEq by mouth daily.    . rosuvastatin (CRESTOR) 10 MG tablet Take 1 tablet (10 mg total) by mouth daily. 90 tablet 3  . tamsulosin (FLOMAX) 0.4 MG CAPS capsule Take 1 capsule (0.4 mg total) by mouth every other day. (Patient taking differently: Take 0.4 mg by mouth at bedtime. )    . vitamin E (VITAMIN E) 180 MG (400 UNITS) capsule Take 400 Units by mouth in the morning and at bedtime.     No current facility-administered medications for this visit.    ROS:   General:  No weight loss, Fever, chills  HEENT: No recent headaches, no nasal bleeding, no visual changes, no sore throat  Neurologic: No dizziness, blackouts, seizures. No recent symptoms of stroke or mini- stroke. No recent episodes of slurred speech, or temporary blindness.  Cardiac: No recent episodes of chest pain/pressure, no shortness of breath at rest.  No shortness of breath with exertion.  Denies history of atrial fibrillation or irregular heartbeat  Vascular: No history of rest pain in feet.  No history of claudication.  + history of non-healing ulcer, No history of DVT   Pulmonary: No home oxygen, no productive cough, no hemoptysis,  No asthma or wheezing  Musculoskeletal:  [ ]  Arthritis, [ ]  Low back pain,  [ ]  Joint pain  Hematologic:No history of hypercoagulable state.  No history of easy bleeding.  No history of anemia  Gastrointestinal: No hematochezia or melena,  No gastroesophageal reflux, no trouble swallowing  Urinary: [X]  chronic Kidney disease, [ ]  on HD - [ ]  MWF or [ ]  TTHS, [ ]   Burning with urination, [ ]  Frequent urination, [ ]  Difficulty urinating;   Skin: No rashes  Psychological: No history of anxiety,  No history of depression   Physical Examination  Vitals:   06/30/20 1021  BP: (!) 179/72  Pulse: (!) 54  Resp: 20  Temp: 98.2 F (36.8 C)  SpO2: 96%  Weight: 192 lb (87.1 kg)  Height: 5\' 8"  (1.727 m)    Body mass index is 29.19 kg/m.  General:  Alert and oriented, no acute distress HEENT: Normal Neck: no JVD Cardiac: Regular Rate and Rhythm Abdomen: Soft, non-tender, non-distended, no mass, right lower quadrant PD catheter Skin: No rash, dark discoloration right fourth toe with dry ulcer 2 mm diameter dorsal aspect of toe plantar aspect of feet and toes are pink Extremity Pulses: Palpable graft pulse right upper arm graft, absent femoral, dorsalis pedis, posterior tibial pulses bilaterally I suspect secondary to calcification Musculoskeletal: No deformity or edema  Neurologic: Upper and lower extremity motor 5/5 and symmetric  DATA:  Patient had a right lower extremity duplex exam today which shows a 70% stenosis of the right common femoral and right superficial femoral artery.  He also had bilateral ABIs performed.  Left side was biphasic but noncompressible vessels.  Toe pressure on the left was 63.  Right side was 0.7 with monophasic waveforms and a toe pressure of 39.  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  Patient has evidence on duplex ultrasound of right common femoral and superficial femoral artery occlusive disease.   PLAN: Aortogram with bilateral lower extremity runoff possible intervention by Dr. Donzetta Matters on Monday, October  11.  I am away that week and the patient wanted to delay his arteriogram due to current ongoing peritoneal dialysis training.  He has seen Dr. Donzetta Matters previously for his dialysis access.  Risk benefits  possible complications and procedure details were discussed with patient today including but not limited to bleeding infection vessel injury contrast reaction.  He understands and agrees to proceed.  Ruta Hinds, MD [facility] Office: [phone]

## 2020-07-09 ENCOUNTER — Other Ambulatory Visit (HOSPITAL_COMMUNITY)
Admission: RE | Admit: 2020-07-09 | Discharge: 2020-07-09 | Disposition: A | Discharge status: Home / Self Care | Payer: Medicare Other | Source: Ambulatory Visit | Attending: Vascular Surgery | Admitting: Vascular Surgery

## 2020-07-09 DIAGNOSIS — Z01812 Encounter for preprocedural laboratory examination: Secondary | ICD-10-CM | POA: Insufficient documentation

## 2020-07-09 DIAGNOSIS — Z20822 Contact with and (suspected) exposure to covid-19: Secondary | ICD-10-CM | POA: Insufficient documentation

## 2020-07-09 LAB — SARS CORONAVIRUS 2 (TAT 6-24 HRS): SARS Coronavirus 2: NEGATIVE

## 2020-07-11 ENCOUNTER — Ambulatory Visit (HOSPITAL_COMMUNITY)
Admission: RE | Admit: 2020-07-11 | Discharge: 2020-07-11 | Disposition: A | Discharge status: Home / Self Care | Payer: Medicare Other | Attending: Vascular Surgery | Admitting: Vascular Surgery

## 2020-07-11 ENCOUNTER — Encounter (HOSPITAL_COMMUNITY)
Admission: RE | Disposition: A | Discharge status: Home / Self Care | Payer: Self-pay | Source: Home / Self Care | Attending: Vascular Surgery

## 2020-07-11 ENCOUNTER — Other Ambulatory Visit: Payer: Self-pay

## 2020-07-11 DIAGNOSIS — E7800 Pure hypercholesterolemia, unspecified: Secondary | ICD-10-CM | POA: Insufficient documentation

## 2020-07-11 DIAGNOSIS — E11621 Type 2 diabetes mellitus with foot ulcer: Secondary | ICD-10-CM | POA: Insufficient documentation

## 2020-07-11 DIAGNOSIS — E1151 Type 2 diabetes mellitus with diabetic peripheral angiopathy without gangrene: Secondary | ICD-10-CM | POA: Insufficient documentation

## 2020-07-11 DIAGNOSIS — L97519 Non-pressure chronic ulcer of other part of right foot with unspecified severity: Secondary | ICD-10-CM | POA: Insufficient documentation

## 2020-07-11 DIAGNOSIS — N186 End stage renal disease: Secondary | ICD-10-CM | POA: Insufficient documentation

## 2020-07-11 DIAGNOSIS — Z7982 Long term (current) use of aspirin: Secondary | ICD-10-CM | POA: Diagnosis not present

## 2020-07-11 DIAGNOSIS — Z87891 Personal history of nicotine dependence: Secondary | ICD-10-CM | POA: Insufficient documentation

## 2020-07-11 DIAGNOSIS — G4733 Obstructive sleep apnea (adult) (pediatric): Secondary | ICD-10-CM | POA: Diagnosis not present

## 2020-07-11 DIAGNOSIS — E1122 Type 2 diabetes mellitus with diabetic chronic kidney disease: Secondary | ICD-10-CM | POA: Insufficient documentation

## 2020-07-11 DIAGNOSIS — I70245 Atherosclerosis of native arteries of left leg with ulceration of other part of foot: Secondary | ICD-10-CM

## 2020-07-11 DIAGNOSIS — Z992 Dependence on renal dialysis: Secondary | ICD-10-CM | POA: Diagnosis not present

## 2020-07-11 DIAGNOSIS — I120 Hypertensive chronic kidney disease with stage 5 chronic kidney disease or end stage renal disease: Secondary | ICD-10-CM | POA: Insufficient documentation

## 2020-07-11 DIAGNOSIS — Z79899 Other long term (current) drug therapy: Secondary | ICD-10-CM | POA: Diagnosis not present

## 2020-07-11 DIAGNOSIS — I70235 Atherosclerosis of native arteries of right leg with ulceration of other part of foot: Secondary | ICD-10-CM | POA: Insufficient documentation

## 2020-07-11 DIAGNOSIS — Z8546 Personal history of malignant neoplasm of prostate: Secondary | ICD-10-CM | POA: Insufficient documentation

## 2020-07-11 DIAGNOSIS — D631 Anemia in chronic kidney disease: Secondary | ICD-10-CM | POA: Insufficient documentation

## 2020-07-11 HISTORY — PX: PERIPHERAL VASCULAR INTERVENTION: CATH118257

## 2020-07-11 HISTORY — PX: PERIPHERAL VASCULAR ATHERECTOMY: CATH118256

## 2020-07-11 HISTORY — PX: ABDOMINAL AORTOGRAM W/LOWER EXTREMITY: CATH118223

## 2020-07-11 HISTORY — PX: PERIPHERAL VASCULAR BALLOON ANGIOPLASTY: CATH118281

## 2020-07-11 LAB — POCT ACTIVATED CLOTTING TIME
Activated Clotting Time: 235 seconds
Activated Clotting Time: 197 seconds
Activated Clotting Time: 153 seconds

## 2020-07-11 LAB — POCT I-STAT, CHEM 8
BUN: 60 mg/dL — ABNORMAL HIGH (ref 8–23)
Calcium, Ion: 1.16 mmol/L (ref 1.15–1.40)
Chloride: 103 mmol/L (ref 98–111)
Creatinine, Ser: 11.8 mg/dL — ABNORMAL HIGH (ref 0.61–1.24)
Glucose, Bld: 141 mg/dL — ABNORMAL HIGH (ref 70–99)
HCT: 38 % — ABNORMAL LOW (ref 39.0–52.0)
Hemoglobin: 12.9 g/dL — ABNORMAL LOW (ref 13.0–17.0)
Potassium: 3.8 mmol/L (ref 3.5–5.1)
Sodium: 142 mmol/L (ref 135–145)
TCO2: 27 mmol/L (ref 22–32)

## 2020-07-11 LAB — GLUCOSE, CAPILLARY: Glucose-Capillary: 146 mg/dL — ABNORMAL HIGH (ref 70–99)

## 2020-07-11 SURGERY — ABDOMINAL AORTOGRAM W/LOWER EXTREMITY
Anesthesia: LOCAL | Laterality: Right

## 2020-07-11 MED ORDER — HYDRALAZINE HCL 20 MG/ML IJ SOLN: 5.0000 mg | INTRAMUSCULAR | Status: DC | PRN

## 2020-07-11 MED ORDER — ACETAMINOPHEN 325 MG PO TABS: 650.0000 mg | ORAL_TABLET | ORAL | Status: DC | PRN

## 2020-07-11 MED ORDER — CLOPIDOGREL BISULFATE 75 MG PO TABS: 75.0000 mg | ORAL_TABLET | Freq: Every day | ORAL | 11 refills | Status: DC

## 2020-07-11 MED ORDER — HEPARIN SODIUM (PORCINE) 1000 UNIT/ML IJ SOLN
INTRAMUSCULAR | Status: AC
  Filled 2020-07-11: qty 1

## 2020-07-11 MED ORDER — HEPARIN (PORCINE) IN NACL 1000-0.9 UT/500ML-% IV SOLN
INTRAVENOUS | Status: AC
  Filled 2020-07-11: qty 1000

## 2020-07-11 MED ORDER — OXYCODONE HCL 5 MG PO TABS: 5.0000 mg | ORAL_TABLET | ORAL | Status: DC | PRN

## 2020-07-11 MED ORDER — MORPHINE SULFATE (PF) 2 MG/ML IV SOLN: 2.0000 mg | INTRAVENOUS | Status: DC | PRN

## 2020-07-11 MED ORDER — FENTANYL CITRATE (PF) 100 MCG/2ML IJ SOLN
INTRAMUSCULAR | Status: DC | PRN
Start: 2020-07-11 — End: 2020-07-11
  Administered 2020-07-11: 50 ug via INTRAVENOUS

## 2020-07-11 MED ORDER — LABETALOL HCL 5 MG/ML IV SOLN
INTRAVENOUS | Status: AC
  Filled 2020-07-11: qty 4

## 2020-07-11 MED ORDER — LIDOCAINE HCL (PF) 1 % IJ SOLN
INTRAMUSCULAR | Status: AC
  Filled 2020-07-11: qty 30

## 2020-07-11 MED ORDER — SODIUM CHLORIDE 0.9% FLUSH: 3.0000 mL | Freq: Two times a day (BID) | INTRAVENOUS | Status: DC

## 2020-07-11 MED ORDER — HYDRALAZINE HCL 20 MG/ML IJ SOLN
INTRAMUSCULAR | Status: AC
  Filled 2020-07-11: qty 1

## 2020-07-11 MED ORDER — HYDRALAZINE HCL 20 MG/ML IJ SOLN
INTRAMUSCULAR | Status: DC | PRN
  Administered 2020-07-11 (×3): 10 mg via INTRAVENOUS

## 2020-07-11 MED ORDER — HEPARIN SODIUM (PORCINE) 1000 UNIT/ML IJ SOLN
INTRAMUSCULAR | Status: DC | PRN
  Administered 2020-07-11: 9000 [IU] via INTRAVENOUS

## 2020-07-11 MED ORDER — LABETALOL HCL 5 MG/ML IV SOLN
10.0000 mg | INTRAVENOUS | Status: DC | PRN
  Administered 2020-07-11 (×2): 10 mg via INTRAVENOUS

## 2020-07-11 MED ORDER — LIDOCAINE HCL (PF) 1 % IJ SOLN
INTRAMUSCULAR | Status: DC | PRN
  Administered 2020-07-11: 15 mL via INTRADERMAL

## 2020-07-11 MED ORDER — ONDANSETRON HCL 4 MG/2ML IJ SOLN: 4.0000 mg | Freq: Four times a day (QID) | INTRAMUSCULAR | Status: DC | PRN

## 2020-07-11 MED ORDER — HEPARIN (PORCINE) IN NACL 1000-0.9 UT/500ML-% IV SOLN
INTRAVENOUS | Status: DC | PRN
  Administered 2020-07-11 (×2): 500 mL

## 2020-07-11 MED ORDER — IODIXANOL 320 MG/ML IV SOLN
INTRAVENOUS | Status: DC | PRN
  Administered 2020-07-11: 175 mL via INTRA_ARTERIAL

## 2020-07-11 MED ORDER — CLOPIDOGREL BISULFATE 300 MG PO TABS
ORAL_TABLET | ORAL | Status: DC | PRN
  Administered 2020-07-11: 300 mg via ORAL

## 2020-07-11 MED ORDER — SODIUM CHLORIDE 0.9% FLUSH: 3.0000 mL | INTRAVENOUS | Status: DC | PRN

## 2020-07-11 MED ORDER — VIPERSLIDE LUBRICANT OPTIME
TOPICAL | Status: DC | PRN
  Administered 2020-07-11: 20 mL via SURGICAL_CAVITY

## 2020-07-11 MED ORDER — FENTANYL CITRATE (PF) 100 MCG/2ML IJ SOLN
INTRAMUSCULAR | Status: AC
  Filled 2020-07-11: qty 2

## 2020-07-11 SURGICAL SUPPLY — 40 items
BAG SNAP BAND KOVER 36X36 (MISCELLANEOUS) ×3 IMPLANT
BALLN COYOTE OTW 4X220X150 (BALLOONS) ×3
BALLN MUSTANG 4X60X135 (BALLOONS) ×3
BALLN MUSTANG 6X80X135 (BALLOONS) ×3
BALLN STERLING OTW 6X60X135 (BALLOONS) ×3
BALLOON COYOTE OTW 4X220X150 (BALLOONS) ×2 IMPLANT
BALLOON MUSTANG 4X60X135 (BALLOONS) ×2 IMPLANT
BALLOON MUSTANG 6X80X135 (BALLOONS) ×2 IMPLANT
BALLOON STERLING OTW 6X60X135 (BALLOONS) ×2 IMPLANT
BUR JETSTREAM XC 2.1/3.0 (BURR) ×2 IMPLANT
BURR JETSTREAM XC 2.1/3.0 (BURR) ×3
CATH 0.018 NAVICROSS ST 135 (CATHETERS) ×3 IMPLANT
CATH ANGIO 5F BER2 65CM (CATHETERS) ×3 IMPLANT
CATH MUSTANG 3X40X135 (BALLOONS) ×3 IMPLANT
CATH NAVICROSS ST .035X135CM (MICROCATHETER) ×3 IMPLANT
CATH OMNI FLUSH 5F 65CM (CATHETERS) ×3 IMPLANT
CATH QUICKCROSS SUPP .035X90CM (MICROCATHETER) ×3 IMPLANT
COVER DOME SNAP 22 D (MISCELLANEOUS) ×3 IMPLANT
DCB RANGER 5.0X200 150 (BALLOONS) ×2 IMPLANT
DCB RANGER 7.0X40 135 (BALLOONS) ×2 IMPLANT
DEVICE EMBOSHIELD NAV6 4.0-7.0 (FILTER) ×3 IMPLANT
DEVICE TORQUE .025-.038 (MISCELLANEOUS) ×3 IMPLANT
GLIDEWIRE ADV .035X260CM (WIRE) ×3 IMPLANT
KIT ENCORE 26 ADVANTAGE (KITS) ×3 IMPLANT
KIT MICROPUNCTURE NIT STIFF (SHEATH) ×3 IMPLANT
KIT PV (KITS) ×3 IMPLANT
LUBRICANT VIPERSLIDE CORONARY (MISCELLANEOUS) ×3 IMPLANT
RANGER DCB 5.0X200 150 (BALLOONS) ×3
RANGER DCB 7.0X40 135 (BALLOONS) ×3
SHEATH PINNACLE 5F 10CM (SHEATH) ×3 IMPLANT
SHEATH PINNACLE ST 7F 45CM (SHEATH) ×3 IMPLANT
SHEATH PROBE COVER 6X72 (BAG) ×3 IMPLANT
STENT INNOVA 6X80X130 (Permanent Stent) ×3 IMPLANT
STENT INNOVA 7X60X130 (Permanent Stent) ×3 IMPLANT
SYR MEDRAD MARK V 150ML (SYRINGE) ×3 IMPLANT
TRANSDUCER W/STOPCOCK (MISCELLANEOUS) ×3 IMPLANT
TRAY PV CATH (CUSTOM PROCEDURE TRAY) ×3 IMPLANT
WIRE BAREWIRE WORK .014X315CM (WIRE) ×3 IMPLANT
WIRE BENTSON .035X145CM (WIRE) ×3 IMPLANT
WIRE G V18X300CM (WIRE) ×3 IMPLANT

## 2020-07-11 NOTE — Interval H&P Note (Signed)
History and Physical Interval Note:  07/11/2020 10:36 AM  Michael Blackwell  has presented today for surgery, with the diagnosis of discoloration of skin on foot.  The various methods of treatment have been discussed with the patient and family. After consideration of risks, benefits and other options for treatment, the patient has consented to  Procedure(s): ABDOMINAL AORTOGRAM W/LOWER EXTREMITY (N/A) as a surgical intervention.  The patient's history has been reviewed, patient examined, no change in status, stable for surgery.  I have reviewed the patient's chart and labs.  Questions were answered to the patient's satisfaction.     Servando Snare

## 2020-07-11 NOTE — Discharge Instructions (Signed)
Femoral Site Care This sheet gives you information about how to care for yourself after your procedure. Your health care provider may also give you more specific instructions. If you have problems or questions, contact your health care provider. What can I expect after the procedure? After the procedure, it is common to have:  Bruising that usually fades within 1-2 weeks.  Tenderness at the site. Follow these instructions at home: Wound care  Follow instructions from your health care provider about how to take care of your insertion site. Make sure you: ? Wash your hands with soap and water before you change your bandage (dressing). If soap and water are not available, use hand sanitizer. ? Change your dressing as told by your health care provider. ? Leave stitches (sutures), skin glue, or adhesive strips in place. These skin closures may need to stay in place for 2 weeks or longer. If adhesive strip edges start to loosen and curl up, you may trim the loose edges. Do not remove adhesive strips completely unless your health care provider tells you to do that.  Do not take baths, swim, or use a hot tub until your health care provider approves.  You may shower 24-48 hours after the procedure or as told by your health care provider. ? Gently wash the site with plain soap and water. ? Pat the area dry with a clean towel. ? Do not rub the site. This may cause bleeding.  Do not apply powder or lotion to the site. Keep the site clean and dry.  Check your femoral site every day for signs of infection. Check for: ? Redness, swelling, or pain. ? Fluid or blood. ? Warmth. ? Pus or a bad smell. Activity  For the first 2-3 days after your procedure, or as long as directed: ? Avoid climbing stairs as much as possible. ? Do not squat.  Do not lift anything that is heavier than 10 lb (4.5 kg), or the limit that you are told, until your health care provider says that it is safe.  Rest as  directed. ? Avoid sitting for a long time without moving. Get up to take short walks every 1-2 hours.  Do not drive for 24 hours if you were given a medicine to help you relax (sedative). General instructions  Take over-the-counter and prescription medicines only as told by your health care provider.  Keep all follow-up visits as told by your health care provider. This is important. Contact a health care provider if you have:  A fever or chills.  You have redness, swelling, or pain around your insertion site. Get help right away if:  The catheter insertion area swells very fast.  You pass out.  You suddenly start to sweat or your skin gets clammy.  The catheter insertion area is bleeding, and the bleeding does not stop when you hold steady pressure on the area.  The area near or just beyond the catheter insertion site becomes pale, cool, tingly, or numb. These symptoms may represent a serious problem that is an emergency. Do not wait to see if the symptoms will go away. Get medical help right away. Call your local emergency services (911 in the [place] Do not drive yourself to the hospital. Summary  After the procedure, it is common to have bruising that usually fades within 1-2 weeks.  Check your femoral site every day for signs of infection.  Do not lift anything that is heavier than 10 lb (4.5 kg), or the   limit that you are told, until your health care provider says that it is safe. This information is not intended to replace advice given to you by your health care provider. Make sure you discuss any questions you have with your health care provider. Document Revised: 09/30/2017 Document Reviewed: 09/30/2017 Elsevier Patient Education  2020 [employer]  

## 2020-07-11 NOTE — Progress Notes (Signed)
Site area: Left  Groin a 7 french long arterial sheath was removed  Site Prior to Removal:  Level 0  Pressure Applied For 20 MINUTES    Bedrest Beginning at 1615p  Manual:   Yes.    Patient Status During Pull:  stable  Post Pull Groin Site:  Level 0  Post Pull Instructions Given:  Yes.    Post Pull Pulses Present:  Yes.    Dressing Applied:  Yes.    Comments:

## 2020-07-11 NOTE — Op Note (Signed)
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 Donzetta Matters, MD Procedure Performed: 1.  Ultrasound-guided cannulation left common femoral artery 2.  Aortogram with bilateral lower extremity runoff 3.  Jetstream atherectomy right common femoral artery and right SFA with filter embolic protection 4.  Drug-coated balloon angioplasty right SFA and common femoral artery 5.  Stent of right common femoral artery with 7 x 60 mm Innova  6.  Stent of proximal right SFA with 6 x 35mm Innova 7.  Plain balloon angioplasty of right profunda femoris artery with 4 x 40 mm balloon   Indications: 79 year old male with history of end-stage renal disease now has fourth toe ulceration on the right as well as pain in the right lower extremity.  He is now indicated for angiography possible invention of the right lower extremity  Findings: The aorta and iliac segments were heavily calcified.  Left common femoral artery where we accessed was heavily calcified there was 1 area that was soft on the anterior wall.  We did not place a closure device due to the heavy disease.  The right lower extremity which is the limb of issue has heavily calcified subtotally occluded right common femoral artery with small flow into the profunda.  The SFA is heavily calcified into areas there is 90% stenosis at the takeoff and subtotally occluded in the midsegment.  Distally his SFA is patent as is his popliteal artery.  He has two-vessel runoff via the peroneal and posterior tibial arteries.  All distal vessels are calcified but there is no flow-limiting stenosis.  The left side has the heavily calcified left common femoral artery than heavily diseased left SFA likely subtotal occlusion in the midsegment.  Profunda does fill.  Popliteal artery appears patent he also  appears to have two-vessel runoff with posterior tibial peroneal arteries on that side.  After atherectomy of the right SFA and common femoral artery we did have a dissection extending up into the common femoral artery.  This was after balloon angioplasty with 4 mm balloon.  We then elected 5 mm drug-coated balloon angioplasty of the SFA and the common femoral arteries.  We then stented the common femoral artery with a 7 x 60 mm bare-metal stent.  This unfortunately because no flow into the profunda.  I then ballooned the profunda femoris artery with 3 and 4 mm balloons and had improved flow.  I did have a dissection in the proximal SFA extended stenting with 6 x 80 mm Innova.  This and the common femoral stent were both postdilated with a 6 mm balloon.  Completion there was great flow through the SFA without any flow-limiting stenosis.  There is flow into the profunda which is similar to his flow initially.  If patient has issues with the stents he will need common femoral endarterectomy with patch angioplasty and hopefully the stent can be divided to allow flow in line to the SFA asflow through the SFA down to the popliteal and two-vessel runoff on the right appears intact without any flow-limiting stenosis.   Procedure:  The patient was identified in the holding area and taken to room 8.  The patient was then placed supine on the table and prepped and draped in the usual sterile fashion.  A time out was called.  Ultrasound was used to evaluate the left common femoral artery.  This  was heavily calcified.  The area was anesthetized 1% lidocaine cannulated micropuncture needle followed by wire and sheath.  A Bentson wire did pass easily into the aorta and we placed a 5 French sheath followed by Omni catheter performed aortogram followed bilateral lower extremity runoff.  With the above findings we crossed the bifurcation with Omni catheter and Glidewire advantage.  A long 7 French sheath was placed into the  external leg artery on the right.  Patient was fully heparinized at this time.  We had to use a combination of Berenstein catheter and Glidewire advantage and ultimately V 18 wire to get through the common femoral artery into the SFA.  We then used a V 18 wire to get distally.  Unfortunately catheter would not pass.  We then used a Glidewire advantage to get distally popliteal artery.  We confirmed intraluminal access with a Nava cross catheter.  We then exchanged for an 014 wire we placed a Nav 6 distal filter.  We then proceeded with jetstream atherectomy of the common femoral artery and the SFA.  We passed twice with blades down and once with blades up through the entire thing.  We then primarily ballooned with 4 mm balloon.  Completion demonstrated dissection of the common femoral artery proximal SFA.  We elected for drug-coated balloon angioplasty with 5 mm balloon of the SFA and the common femoral artery.  I also used a 6 mm drug-coated balloon in the common femoral artery.  Completion demonstrated residual dissection in the proximal SFA and common femoral artery.  The filter was removed.  I elected to stent the common femoral artery given that not stenting would require common femoral endarterectomy.  After I stented this I postdilated with a 6 mm balloon.  Completion demonstrated no flow into the profunda.  I then selected the profunda with a Glidewire and bare catheter.  I have first ballooned the stent interstices into the profunda with 3 mm balloon followed by 4 mm balloon.  Completion demonstrated brisk flow into the profunda that was stable from pre intervention flow.  I then extended the stent down into the SFA.  The common femoral and the SFA stents were postdilated with 6 mm balloon.  Completion demonstrated flow through the SFA into the 2 vessels running to the foot.  Satisfied with this we elected to retract the sheath in the left external iliac artery.  Patient will receive 300 mg of Plavix.  He  tolerated procedure well any complication.  Contrast: 175 cc    Sabastion Hrdlicka C. Donzetta Matters, MD [facility] Office: [phone] Pager: [phone]

## 2020-07-11 NOTE — Progress Notes (Signed)
Patient wife was given discharge instructions. She verbalized understanding.

## 2020-07-12 ENCOUNTER — Encounter (HOSPITAL_COMMUNITY): Payer: Self-pay | Admitting: Vascular Surgery

## 2020-07-15 ENCOUNTER — Emergency Department (HOSPITAL_COMMUNITY): Payer: Medicare Other

## 2020-07-15 ENCOUNTER — Emergency Department (HOSPITAL_COMMUNITY)
Admission: EM | Admit: 2020-07-15 | Discharge: 2020-07-15 | Disposition: A | Discharge status: Home / Self Care | Payer: Medicare Other | Attending: Emergency Medicine | Admitting: Emergency Medicine

## 2020-07-15 ENCOUNTER — Other Ambulatory Visit: Payer: Self-pay

## 2020-07-15 DIAGNOSIS — I132 Hypertensive heart and chronic kidney disease with heart failure and with stage 5 chronic kidney disease, or end stage renal disease: Secondary | ICD-10-CM | POA: Diagnosis not present

## 2020-07-15 DIAGNOSIS — Z8546 Personal history of malignant neoplasm of prostate: Secondary | ICD-10-CM | POA: Insufficient documentation

## 2020-07-15 DIAGNOSIS — Z7982 Long term (current) use of aspirin: Secondary | ICD-10-CM | POA: Diagnosis not present

## 2020-07-15 DIAGNOSIS — Z87891 Personal history of nicotine dependence: Secondary | ICD-10-CM | POA: Diagnosis not present

## 2020-07-15 DIAGNOSIS — Z992 Dependence on renal dialysis: Secondary | ICD-10-CM | POA: Diagnosis not present

## 2020-07-15 DIAGNOSIS — I509 Heart failure, unspecified: Secondary | ICD-10-CM | POA: Insufficient documentation

## 2020-07-15 DIAGNOSIS — S39012A Strain of muscle, fascia and tendon of lower back, initial encounter: Secondary | ICD-10-CM | POA: Diagnosis not present

## 2020-07-15 DIAGNOSIS — E1122 Type 2 diabetes mellitus with diabetic chronic kidney disease: Secondary | ICD-10-CM | POA: Diagnosis not present

## 2020-07-15 DIAGNOSIS — N186 End stage renal disease: Secondary | ICD-10-CM | POA: Diagnosis not present

## 2020-07-15 DIAGNOSIS — T148XXA Other injury of unspecified body region, initial encounter: Secondary | ICD-10-CM | POA: Diagnosis present

## 2020-07-15 DIAGNOSIS — M545 Low back pain, unspecified: Secondary | ICD-10-CM | POA: Diagnosis present

## 2020-07-15 DIAGNOSIS — X58XXXA Exposure to other specified factors, initial encounter: Secondary | ICD-10-CM | POA: Insufficient documentation

## 2020-07-15 DIAGNOSIS — S3992XA Unspecified injury of lower back, initial encounter: Secondary | ICD-10-CM | POA: Diagnosis present

## 2020-07-15 DIAGNOSIS — Z79899 Other long term (current) drug therapy: Secondary | ICD-10-CM | POA: Insufficient documentation

## 2020-07-15 LAB — CBC WITH DIFFERENTIAL/PLATELET
Abs Immature Granulocytes: 0.03 10*3/uL (ref 0.00–0.07)
Basophils Absolute: 0 10*3/uL (ref 0.0–0.1)
Basophils Relative: 0 %
Eosinophils Absolute: 0.1 10*3/uL (ref 0.0–0.5)
Eosinophils Relative: 1 %
HCT: 33.2 % — ABNORMAL LOW (ref 39.0–52.0)
Hemoglobin: 10.4 g/dL — ABNORMAL LOW (ref 13.0–17.0)
Immature Granulocytes: 0 %
Lymphocytes Relative: 15 %
Lymphs Abs: 1.2 10*3/uL (ref 0.7–4.0)
MCH: 27.6 pg (ref 26.0–34.0)
MCHC: 31.3 g/dL (ref 30.0–36.0)
MCV: 88.1 fL (ref 80.0–100.0)
Monocytes Absolute: 0.8 10*3/uL (ref 0.1–1.0)
Monocytes Relative: 10 %
Neutro Abs: 5.8 10*3/uL (ref 1.7–7.7)
Neutrophils Relative %: 74 %
Platelets: 163 10*3/uL (ref 150–400)
RBC: 3.77 MIL/uL — ABNORMAL LOW (ref 4.22–5.81)
RDW: 18.4 % — ABNORMAL HIGH (ref 11.5–15.5)
WBC: 8 10*3/uL (ref 4.0–10.5)
nRBC: 0 % (ref 0.0–0.2)

## 2020-07-15 LAB — COMPREHENSIVE METABOLIC PANEL
ALT: 9 U/L (ref 0–44)
AST: 7 U/L — ABNORMAL LOW (ref 15–41)
Albumin: 2.7 g/dL — ABNORMAL LOW (ref 3.5–5.0)
Alkaline Phosphatase: 38 U/L (ref 38–126)
Anion gap: 19 — ABNORMAL HIGH (ref 5–15)
BUN: 67 mg/dL — ABNORMAL HIGH (ref 8–23)
CO2: 24 mmol/L (ref 22–32)
Calcium: 8.9 mg/dL (ref 8.9–10.3)
Chloride: 96 mmol/L — ABNORMAL LOW (ref 98–111)
Creatinine, Ser: 13.29 mg/dL — ABNORMAL HIGH (ref 0.61–1.24)
GFR, Estimated: 3 mL/min — ABNORMAL LOW (ref >60)
Glucose, Bld: 139 mg/dL — ABNORMAL HIGH (ref 70–99)
Potassium: 3.9 mmol/L (ref 3.5–5.1)
Sodium: 139 mmol/L (ref 135–145)
Total Bilirubin: 0.9 mg/dL (ref 0.3–1.2)
Total Protein: 6.2 g/dL — ABNORMAL LOW (ref 6.5–8.1)

## 2020-07-15 MED ORDER — OXYCODONE-ACETAMINOPHEN 5-325 MG PO TABS
1.0000 | ORAL_TABLET | Freq: Once | ORAL | Status: AC
  Administered 2020-07-15: 1 via ORAL
  Filled 2020-07-15: qty 1

## 2020-07-15 MED ORDER — CYCLOBENZAPRINE HCL 5 MG PO TABS: 5.0000 mg | ORAL_TABLET | Freq: Three times a day (TID) | ORAL | 0 refills | Status: AC | PRN

## 2020-07-15 MED ORDER — ACETAMINOPHEN 325 MG PO TABS: 650.0000 mg | ORAL_TABLET | Freq: Once | ORAL | Status: DC

## 2020-07-15 MED ORDER — DIAZEPAM 5 MG/ML IJ SOLN
2.5000 mg | Freq: Once | INTRAMUSCULAR | Status: AC
  Administered 2020-07-15: 2.5 mg via INTRAVENOUS
  Filled 2020-07-15: qty 2

## 2020-07-15 MED ORDER — LIDOCAINE 5 % EX PTCH
2.0000 | MEDICATED_PATCH | CUTANEOUS | Status: DC
  Administered 2020-07-15: 2 via TRANSDERMAL
  Filled 2020-07-15: qty 2

## 2020-07-15 MED ORDER — MORPHINE SULFATE (PF) 4 MG/ML IV SOLN
4.0000 mg | Freq: Once | INTRAVENOUS | Status: AC
  Administered 2020-07-15: 4 mg via INTRAVENOUS
  Filled 2020-07-15: qty 1

## 2020-07-15 MED ORDER — OXYCODONE-ACETAMINOPHEN 5-325 MG PO TABS: 1.0000 | ORAL_TABLET | Freq: Four times a day (QID) | ORAL | 0 refills | Status: AC | PRN

## 2020-07-15 NOTE — ED Triage Notes (Signed)
Pt arrives via EMS from home with a complaint of lower back pain that started this morning. Pt denies any recent falls or injuries that could've aggravated the back pain. VSS. NAD.   BP 156/72  HR- 76 O2-96 % RA

## 2020-07-15 NOTE — ED Notes (Signed)
Pt aaox4, gcs15, reporting chronic back pain, nadn, vss at discharge, pt able to turn and move freely in bed without difficulty. Pt able to stand and pivot to wheelchair w/o difficulty

## 2020-07-15 NOTE — ED Notes (Signed)
Patient transported to CT 

## 2020-07-15 NOTE — ED Provider Notes (Signed)
Michael Blackwell   CSN: [id number] Arrival date & time: 07/15/20  1805     History Chief Complaint  Patient presents with  . Back Pain    Michael Blackwell is a 79 y.o. male with past medical history of right PAD status post stents placement 07/11/2020, ESRD on peritoneal dialysis, presents to ED for acute complaint of neck pain.  Patient states that the pain started this morning, located at the lower back, described pain as stabbing, rated 9 out of 10, worse with movement, better with rest.  Patient states that he started peritoneal dialysis last week so he has been laying on his side, which he contributes to his back pain.  No trauma related.  Patient denies fever, chills, lower extremity weakness, sciatica, urinary or bowel incontinence.  Patient reports severe pain with movement during examination  HPI     Past Medical History:  Diagnosis Date  . Acute respiratory failure with hypoxia ([place]) 12/05/2016   [name] 12/05/2016  . Anemia   . Anxiety   . Arthritis    "joints ache at night sometimes" (3/8/20180  . Chronic kidney disease (CKD), stage II (mild)    Acute on chronic kidney disease stage II-III/notes 12/06/2016  . Dyspnea    with exertion  . ESRD (end stage renal disease) ([place])    [facility] T[facility]AT [place]  . Heart murmur    never has caused any problems per patient  . History of blood transfusion   . History of radiation therapy 02/25/14- 04/23/14   prostate 7800 cGy 40 sessions, seminal vesicles 5600 cGy 40 sessions  . Hypercholesteremia   . Hypertension   . OSA on CPAP   . Pneumonia 12/06/2016  . Prostate cancer ([place]) 11/19/13   gleason 4+3=7, volume 34.74 cc  . Spermatocele    right  . Type II diabetes mellitus (HCC)    diet controlled  . Wears partial dentures    upper    Patient Active Problem List   Diagnosis Date Noted  . Acute bilateral low back pain without sciatica 07/15/2020  . Muscle strain 07/15/2020  .  Acute exacerbation of CHF (congestive heart failure) ([place]) 11/15/2019  . (HFpEF) heart failure with preserved ejection fraction ([place]) 11/15/2019  . Hypertensive emergency 11/15/2019  . Demand ischemia ([place]) 11/15/2019  . Symptomatic anemia 04/06/2019  . 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 Renal artery stenosis ([place]) 02/24/2018  . Acute respiratory failure with hypoxia ([place]) 12/10/2016  . Essential hypertension, malignant 12/10/2016  . Hyperlipidemia LDL goal <100 12/10/2016  . Iron deficiency anemia 12/10/2016  . Type 2 diabetes mellitus with renal manifestations ([place]) 12/05/2016  . Malignant neoplasm of prostate ([place]) 01/12/2014    Past Surgical History:  Procedure Laterality Date  . ABDOMINAL AORTOGRAM W/LOWER EXTREMITY N/A 07/11/2020   Procedure: ABDOMINAL AORTOGRAM W/LOWER EXTREMITY;  Surgeon: [name], MD;  Location: [facility];  Service: Cardiovascular;  Laterality: N/A;  . AV FISTULA PLACEMENT Right 03/04/2019   Procedure: RIGHT ARM ARTERIOVENOUS  FISTULA CREATION;  Surgeon: [name], MD;  Location: [facility];  Service: Vascular;  Laterality: Right;  . AV FISTULA PLACEMENT Right 12/30/2019   Procedure: INSERTION OF right  arm ARTERIOVENOUS (AV) GORE-TEX GRAFT;  Surgeon: [name], MD;  Location: [facility];  Service: Vascular;  Laterality: Right;  . BASCILIC VEIN TRANSPOSITION Right 05/26/2019   Procedure: Right Arm BASILIC VEIN TRANSPOSITION SECOND STAGE;  Surgeon: Servando Snare  Harrell Gave, MD;  Location: [facility];  Service: Vascular;  Laterality: Right;  . COLONOSCOPY    . LOWER EXTREMITY ANGIOGRAPHY Bilateral 02/25/2018   Procedure: Lower Extremity Angiography;  Surgeon: [name], MD;  Location: [facility];  Service: Cardiovascular;  Laterality: Bilateral;  limited  . PERIPHERAL VASCULAR ATHERECTOMY Right 07/11/2020   Procedure: PERIPHERAL VASCULAR ATHERECTOMY;  Surgeon: [name],  MD;  Location: [facility];  Service: Cardiovascular;  Laterality: Right;  common femoral; sfa  . PERIPHERAL VASCULAR BALLOON ANGIOPLASTY Right 07/11/2020   Procedure: PERIPHERAL VASCULAR BALLOON ANGIOPLASTY;  Surgeon: [name], MD;  Location: [facility];  Service: Cardiovascular;  Laterality: Right;  [facility]  . PERIPHERAL VASCULAR INTERVENTION Right 07/11/2020   Procedure: PERIPHERAL VASCULAR INTERVENTION;  Surgeon: [name], MD;  Location: [facility];  Service: Cardiovascular;  Laterality: Right;  common femoral and superficial femoral stent  . PROSTATE BIOPSY  11/19/13   Gleason 4+3=7, vol 34.74 cc  . RENAL ANGIOGRAPHY N/A 02/25/2018   Procedure: RENAL ANGIOGRAPHY;  Surgeon: [name], MD;  Location: [facility];  Service: Cardiovascular;  Laterality: N/A;  . TONSILLECTOMY     as child       Family History  Problem Relation Age of Onset  . Cancer Brother        brain  . Cancer Mother        stomach  . Cancer Father        ? prostate    Social History   Tobacco Use  . Smoking status: Former Smoker    Years: 48.00    Types: Cigarettes    Quit date: 07/02/2019    Years since quitting: 1.0  . Smokeless tobacco: Never Used  . Tobacco comment: "I smoke 1 cig a month"- per patient on 05/25/19  Vaping Use  . Vaping Use: Never used  Substance Use Topics  . Alcohol use: Not Currently  . Drug use: No    Home Medications Prior to Admission medications   Medication Sig Start Date End Date Taking? Authorizing Provider  amLODipine (NORVASC) 10 MG tablet Take 5 mg by mouth at bedtime.     [provider]  aspirin EC 81 MG tablet Take 81 mg by mouth at bedtime.    [provider]  B Complex-C (B-COMPLEX WITH VITAMIN C) tablet Take 1 tablet by mouth daily.    [provider]  bismuth subsalicylate (PEPTO BISMOL) 262 MG/15ML suspension Take 30 mLs by mouth every 6 (six) hours as needed  for indigestion (upset stomach).     [provider]  Calcium Carb-Cholecalciferol (CALCIUM 600+D) 600-800 MG-UNIT TABS Take 1 tablet by mouth daily.     [provider]  calcium carbonate (TUMS - DOSED IN MG ELEMENTAL CALCIUM) 500 MG chewable tablet Chew 1 tablet by mouth 3 (three) times daily after meals.    [provider]  cholecalciferol (VITAMIN D3) 25 MCG (1000 UT) tablet Take 1,000 Units by mouth daily.    [provider]  cloNIDine (CATAPRES) 0.3 MG tablet Take 0.6 mg by mouth 2 (two) times daily.  12/12/15   [provider]  clopidogrel (PLAVIX) 75 MG tablet Take 1 tablet (75 mg total) by mouth daily. 07/11/20 07/11/21  [name], MD  cyclobenzaprine (FLEXERIL) 5 MG tablet Take 1 tablet (5 mg total) by mouth 3 (three) times daily as needed for up to 7 days for muscle spasms. 07/15/20 07/22/20  Gaylan Gerold, DO  hydrALAZINE (APRESOLINE) 100 MG tablet Take 100 mg by mouth 3 (three) times daily.     [provider]  isosorbide mononitrate (IMDUR) 30 MG 24 hr tablet Take 30 mg by mouth every evening.     [provider]  lidocaine-prilocaine (EMLA) cream Apply 1 application topically daily as needed (prior to port being accessed).  02/22/20   [provider]  multivitamin (RENA-VIT) TABS tablet Take 1 tablet by mouth daily. 12/07/19   [provider]  Omega-3 Fatty Acids (OMEGA 3 PO) Take 1,040 mg by mouth daily.     [provider]  oxyCODONE-acetaminophen (PERCOCET) 5-325 MG tablet Take 1 tablet by mouth every 6 (six) hours as needed for up to 3 days for severe pain. 07/15/20 07/18/20  Gaylan Gerold, DO  phenylephrine (NEO-SYNEPHRINE COLD & SINUS) 1 % nasal spray Place 1 drop into both nostrils at bedtime as needed for congestion.     [provider]  polyethylene glycol (MIRALAX MIX-IN PAX) 17 g packet Take 17 g by mouth daily as needed (constipation.).    [provider]    rosuvastatin (CRESTOR) 10 MG tablet Take 1 tablet (10 mg total) by mouth daily. 06/21/20   Adrian Prows, MD  tamsulosin (FLOMAX) 0.4 MG CAPS capsule Take 1 capsule (0.4 mg total) by mouth every other day. Patient taking differently: Take 0.4 mg by mouth at bedtime.  11/16/19   Aline August, MD  vitamin E (VITAMIN E) 180 MG (400 UNITS) capsule Take 400 Units by mouth daily.     [provider]    Allergies    Patient has no known allergies.  Review of Systems   Review of Systems  Constitutional: Negative for chills and fever.  Gastrointestinal:       Negative for incontinence  Musculoskeletal: Positive for back pain and myalgias.  Neurological: Negative for weakness and numbness.    Physical Exam Updated Vital Signs BP (!) 138/53 (BP Location: Left Arm)   Pulse 67   Temp 98.5 F (36.9 C) (Oral)   Resp 18   Ht 5\' 8"  (1.727 m)   Wt 81 kg   SpO2 97%   BMI 27.15 kg/m   Physical Exam Constitutional:      General: He is not in acute distress. HENT:     Head: Normocephalic.  Eyes:     General:        Right eye: No discharge.        Left eye: No discharge.  Cardiovascular:     Rate and Rhythm: Normal rate and regular rhythm.     Heart sounds: No murmur heard.   Pulmonary:     Effort: Pulmonary effort is normal. No respiratory distress.     Breath sounds: Normal breath sounds.  Musculoskeletal:     Comments: Severe pain with movement. No midline tenderness Significant tenderness to palpation bilateral lower back No skin changes, no rash Negative straight leg raise test Normal range of motion and strength of bilateral lower extremities  Skin:    General: Skin is warm.     Coloration: Skin is not jaundiced.     Findings: No rash.  Neurological:     Mental Status: He is alert. Mental status is at baseline.     ED Results / Procedures / Treatments   Labs (all labs ordered are listed, but only abnormal results are displayed) Labs Reviewed  CBC WITH  DIFFERENTIAL/PLATELET - Abnormal; Notable for the following components:  Result Value   RBC 3.77 (*)    Hemoglobin 10.4 (*)    HCT 33.2 (*)    RDW 18.4 (*)    All other components within normal limits  COMPREHENSIVE METABOLIC PANEL - Abnormal; Notable for the following components:   Chloride 96 (*)    Glucose, Bld 139 (*)    BUN 67 (*)    Creatinine, Ser 13.29 (*)    Total Protein 6.2 (*)    Albumin 2.7 (*)    AST 7 (*)    GFR, Estimated 3 (*)    Anion gap 19 (*)    All other components within normal limits    EKG None  Radiology CT Lumbar Spine Wo Contrast  Result Date: 07/15/2020 CLINICAL DATA:  Lumbar compression fracture EXAM: CT LUMBAR SPINE WITHOUT CONTRAST TECHNIQUE: Multidetector CT imaging of the lumbar spine was performed without intravenous contrast administration. Multiplanar CT image reconstructions were also generated. COMPARISON:  None. FINDINGS: Segmentation: 5 lumbar type vertebrae Alignment: Degenerative grade 1 anterolisthesis at L4-5 Vertebrae: No acute fracture or focal pathologic process. Paraspinal and other soft tissues: No perispinal inflammation or mass is seen. Diffuse atherosclerotic calcification. Intrathecal calcifications to a limited degree at the level of L5 and S1. Prominent density of the kidneys and contrast within bowel, presumably a recent abdomen and pelvis CT in this patient with evidence of peritoneal dialysis. Disc levels: L2-L3: Mild disc bulging. L3-L4: Disc bulging and facet spurring with mild to moderate spinal stenosis especially at the subarticular recesses. Moderate bilateral foraminal narrowing. L4-L5: Facet osteoarthritis with spurring and anterolisthesis. The disc is narrowed and fissured with bulging. High-grade spinal stenosis. Right more than left foraminal impingement. L5-S1:Disc narrowing and bulging. Degenerative facet spurring on both sides. At least moderate left foraminal impingement. Patent spinal canal. IMPRESSION: 1. No  acute finding, including compression fracture. 2. Lumbar spine degeneration especially of the facets at L3-4 and below, with L4-5 anterolisthesis. 3. Spinal stenosis which is high-grade at L4-5. 4. At least moderate foraminal narrowing at L3-4 and below. 5. 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 Electronically Signed   By: Monte Fantasia M.D.   On: 07/15/2020 20:04    Procedures Procedures (including critical care time)  Medications Ordered in ED Medications  lidocaine (LIDODERM) 5 % 2 patch (2 patches Transdermal Patch Applied 07/15/20 2051)  oxyCODONE-acetaminophen (PERCOCET/ROXICET) 5-325 MG per tablet 1 tablet (has no administration in time range)  morphine 4 MG/ML injection 4 mg (4 mg Intravenous Given 07/15/20 2052)  diazepam (VALIUM) injection 2.5 mg (2.5 mg Intravenous Given 07/15/20 2053)    ED Course  I have reviewed the triage vital signs and the nursing notes.  Pertinent labs & imaging results that were available during my care of the patient were reviewed by me and considered in my medical decision making (see chart for details).  Patient seen and examined.  Severe back pain with movement and to palpation of lower back.  No trauma related. Due to the degree of pain, obtain CT lumbar spine without contrast.  Morphine for pain.  CBC and CMP obtained  BP (!) 138/53 (BP Location: Left Arm)   Pulse 67   Temp 98.5 F (36.9 C) (Oral)   Resp 18   Ht 5\' 8"  (1.727 m)   Wt 81 kg   SpO2 97%   BMI 27.15 kg/m   CT lumbar spine is negative for acute changes.  CBC shows hemoglobin of 10.4 with no leukocytosis.  CMP shows unremarkable electrolytes  with creatinine 13.29.  Patient has ESRD on peritoneal dialysis.  This is likely muscle strain due to his sleeping position.  Pain improved with morphine and Valium.  Plan: Patient will be discharged with Flexeril and 3 days of Percocet.  Patient is advised to use Tylenol for pain.  Avoid Advil due to his  ESRD.  Back to ED for worsening pain. Patient agrees with plan.    MDM Rules/Calculators/A&P                          Patient presents the ED for acute severe pain of lower back bilaterally. Pain is worse with movement and better with rest. No red flag symptoms such as fever or chills, urinary or bowel incontinence, weakness of lower extremities. No midline tenderness. CT of lumbar spine was ordered given his degree of pain, which came back negative for any acute changes. Blood works are also unremarkable. This is likely a muscle strain due to his sleeping position. Patient will be discharged with Flexeril and Percocet. Also advised patient to obtain lidocaine patch over-the-counter. Come back to the ED for worsening pain.  Final Clinical Impression(s) / ED Diagnoses Final diagnoses:  Acute bilateral low back pain without sciatica  Muscle strain    Rx / DC Orders ED Discharge Orders         Ordered    cyclobenzaprine (FLEXERIL) 5 MG tablet  3 times daily PRN        07/15/20 2112    oxyCODONE-acetaminophen (PERCOCET) 5-325 MG tablet  Every 6 hours PRN        07/15/20 2112           Gaylan Gerold, DO 07/15/20 2117    Drenda Freeze, MD 07/18/20 [phone]

## 2020-07-15 NOTE — Discharge Instructions (Addendum)
Mr. Michael Blackwell, it is a pleasure getting to know you today. You came to the ED for back pain. CT imaging of your lower back is negative for fracture. Your blood work are also unremarkable. This is likely a muscle strain. I will prescribe Flexeril for muscle relaxant and 3 days of Percocet for pain. Please take Tylenol for pain as needed and avoid Advil. Please come back to the ED for worsening pain.  Take care

## 2020-07-21 ENCOUNTER — Other Ambulatory Visit: Payer: Self-pay

## 2020-07-21 DIAGNOSIS — I739 Peripheral vascular disease, unspecified: Secondary | ICD-10-CM

## 2020-07-27 ENCOUNTER — Ambulatory Visit: Payer: Medicare Other

## 2020-07-27 ENCOUNTER — Other Ambulatory Visit: Payer: Self-pay

## 2020-07-27 DIAGNOSIS — I6523 Occlusion and stenosis of bilateral carotid arteries: Secondary | ICD-10-CM

## 2020-07-27 DIAGNOSIS — R82998 Other abnormal findings in urine: Secondary | ICD-10-CM | POA: Insufficient documentation

## 2020-08-02 NOTE — Progress Notes (Signed)
Spoke with patient's spouse. Spouse voiced understanding and will pass message to pt.

## 2020-08-05 ENCOUNTER — Other Ambulatory Visit: Payer: Self-pay

## 2020-08-05 ENCOUNTER — Ambulatory Visit (INDEPENDENT_AMBULATORY_CARE_PROVIDER_SITE_OTHER): Payer: Medicare Other | Admitting: Physician Assistant

## 2020-08-05 ENCOUNTER — Ambulatory Visit (INDEPENDENT_AMBULATORY_CARE_PROVIDER_SITE_OTHER)
Admission: RE | Admit: 2020-08-05 | Discharge: 2020-08-05 | Disposition: A | Discharge status: Home / Self Care | Payer: Medicare Other | Source: Ambulatory Visit | Attending: Vascular Surgery | Admitting: Vascular Surgery

## 2020-08-05 ENCOUNTER — Ambulatory Visit (HOSPITAL_COMMUNITY)
Admission: RE | Admit: 2020-08-05 | Discharge: 2020-08-05 | Disposition: A | Discharge status: Home / Self Care | Payer: Medicare Other | Source: Ambulatory Visit | Attending: Vascular Surgery | Admitting: Vascular Surgery

## 2020-08-05 VITALS — BP 138/58 | HR 66 | Temp 98.4°F | Resp 20 | Ht 68.0 in | Wt 179.6 lb

## 2020-08-05 DIAGNOSIS — I739 Peripheral vascular disease, unspecified: Secondary | ICD-10-CM

## 2020-08-05 DIAGNOSIS — L97519 Non-pressure chronic ulcer of other part of right foot with unspecified severity: Secondary | ICD-10-CM | POA: Diagnosis not present

## 2020-08-05 MED ORDER — CLOPIDOGREL BISULFATE 75 MG PO TABS: 75.0000 mg | ORAL_TABLET | Freq: Every day | ORAL | 3 refills | Status: AC

## 2020-08-05 NOTE — Progress Notes (Signed)
Postoperative Visit ([facility])   History of Present Illness   Michael Blackwell is a 79 y.o. male who presents status post atherectomy of right CFA and SFA with drug-coated balloon angioplasty of right SFA and CFA.  Due to dissection patient also required stenting of right CFA and proximal right SFA with balloon angioplasty of right profunda by Dr. Donzetta Matters on 07/11/2020.  This was performed due to critical limb ischemia with right fourth toe ulceration.  Patient returns today and states right fourth toe ulcer is nearly healed.  He denies any claudication, rest pain, or any further tissue loss of bilateral lower extremities.  He is on aspirin and statin daily.  He is unfamiliar with Plavix and does not believe he is currently taking it.  Patient states he still smokes occasionally.  Current Outpatient Medications  Medication Sig Dispense Refill  . amLODipine (NORVASC) 10 MG tablet Take 5 mg by mouth at bedtime.     Marland Kitchen aspirin EC 81 MG tablet Take 81 mg by mouth at bedtime.    . B Complex-C (B-COMPLEX WITH VITAMIN C) tablet Take 1 tablet by mouth daily.    Marland Kitchen bismuth subsalicylate (PEPTO BISMOL) 262 MG/15ML suspension Take 30 mLs by mouth every 6 (six) hours as needed for indigestion (upset stomach).     . Calcium Carb-Cholecalciferol (CALCIUM 600+D) 600-800 MG-UNIT TABS Take 1 tablet by mouth daily.     . calcium carbonate (TUMS - DOSED IN MG ELEMENTAL CALCIUM) 500 MG chewable tablet Chew 1 tablet by mouth 3 (three) times daily after meals.    . cloNIDine (CATAPRES) 0.3 MG tablet Take 0.6 mg by mouth 2 (two) times daily.     . clopidogrel (PLAVIX) 75 MG tablet Take 1 tablet (75 mg total) by mouth daily. 90 tablet 3  . hydrALAZINE (APRESOLINE) 100 MG tablet Take 100 mg by mouth 3 (three) times daily.     . isosorbide mononitrate (IMDUR) 30 MG 24 hr tablet Take 30 mg by mouth every evening.     . lidocaine-prilocaine (EMLA) cream Apply 1 application topically daily as needed (prior to port being  accessed).     . Omega-3 Fatty Acids (OMEGA 3 PO) Take 1,040 mg by mouth daily.     . phenylephrine (NEO-SYNEPHRINE COLD & SINUS) 1 % nasal spray Place 1 drop into both nostrils at bedtime as needed for congestion.     . polyethylene glycol (MIRALAX MIX-IN PAX) 17 g packet Take 17 g by mouth daily as needed (constipation.).    Marland Kitchen rosuvastatin (CRESTOR) 10 MG tablet Take 1 tablet (10 mg total) by mouth daily. 90 tablet 3  . sitaGLIPtin (JANUVIA) 100 MG tablet Take by mouth.    . tamsulosin (FLOMAX) 0.4 MG CAPS capsule Take 1 capsule (0.4 mg total) by mouth every other day. (Patient taking differently: Take 0.4 mg by mouth at bedtime. )    . vitamin E (VITAMIN E) 180 MG (400 UNITS) capsule Take 400 Units by mouth daily.      Current Facility-Administered Medications  Medication Dose Route Frequency Provider Last Rate Last Admin  . 0.9 %  sodium chloride infusion  250 mL Intravenous PRN [name], MD        ROS: 10 system ROS negative unless otherwise noted   For VQI Use Only   PRE-ADM LIVING: Home  AMB STATUS: Ambulatory   Physical Examination   Vitals:   08/05/20 1329  BP: (!) 138/58  Pulse: 66  Resp: 20  Temp: 98.4 F (36.9 C)  TempSrc: Temporal  SpO2: 98%  Weight: 179 lb 9.6 oz (81.5 kg)  Height: 5\' 8"  (1.727 m)   Body mass index is 27.31 kg/m.  General Alert, O x 3, WD, NAD  Pulmonary Sym exp, good B air movt,  Cardiac RRR, Nl S1, S2,  Vascular Vessel Right Left  PT Not palpable Not palpable  DP Not palpable Not palpable    Gastrointestinal soft, non-distended, non-tender to palpation,  Musculoskeletal M/S 5/5 throughout  , Extremities without ischemic changes  , No edema present,  No palpable L groin pseudo  Neurologic  Pain and light touch intact in extremities , Motor exam as listed above    Medical Decision Making   Michael Blackwell is a 79 y.o. male who presents s/p R CFA and SFA atherectomy and drug-coated balloon angioplasty.  Due to  dissection patient also required stenting of right common femoral artery and SFA  Right fourth toe ulceration nearly healed Based on arterial duplex recent intervention is widely patent Continue aspirin and statin; Plavix prescription reordered Recheck arterial duplex and ABIs in 6 months per protocol   Dagoberto Ligas PA-C [facility] Office: [phone]  Clinic MD: Donzetta Matters on call

## 2020-10-17 ENCOUNTER — Ambulatory Visit: Payer: Medicare Other | Admitting: Podiatry

## 2020-10-31 ENCOUNTER — Other Ambulatory Visit: Payer: Self-pay | Admitting: Podiatry

## 2020-10-31 ENCOUNTER — Encounter: Payer: Self-pay | Admitting: Podiatry

## 2020-10-31 ENCOUNTER — Other Ambulatory Visit: Payer: Self-pay

## 2020-10-31 ENCOUNTER — Ambulatory Visit (INDEPENDENT_AMBULATORY_CARE_PROVIDER_SITE_OTHER): Payer: Medicare Other

## 2020-10-31 ENCOUNTER — Ambulatory Visit (INDEPENDENT_AMBULATORY_CARE_PROVIDER_SITE_OTHER): Payer: Medicare Other | Admitting: Podiatry

## 2020-10-31 DIAGNOSIS — G629 Polyneuropathy, unspecified: Secondary | ICD-10-CM

## 2020-10-31 DIAGNOSIS — M19071 Primary osteoarthritis, right ankle and foot: Secondary | ICD-10-CM | POA: Diagnosis not present

## 2020-10-31 DIAGNOSIS — M79671 Pain in right foot: Secondary | ICD-10-CM

## 2020-10-31 DIAGNOSIS — I739 Peripheral vascular disease, unspecified: Secondary | ICD-10-CM | POA: Diagnosis not present

## 2020-10-31 DIAGNOSIS — E1142 Type 2 diabetes mellitus with diabetic polyneuropathy: Secondary | ICD-10-CM

## 2020-10-31 NOTE — Progress Notes (Signed)
  Subjective:  Patient ID: Michael Blackwell, male    DOB: 1941-05-20,  MRN: [id number]  Chief Complaint  Patient presents with  . Foot Pain    Right foot. PT stated that his right foot underneath his toes feels funny he denies pain     80 y.o. male presents with the above complaint. History confirmed with patient.  Describes this mostly bothering him at night feels like a tingling numbness he feels in the toes, he has in the left foot as well but is mostly bothersome on the right side.  Objective:  Physical Exam: warm, good capillary refill, no trophic changes or ulcerative lesions, normal monofilament exam and normal sensory exam.  Weakly palpable PT and DP pulses.  He has vague sensory changes that he describes not necessarily as pain at the tips of the toes, worse medially.  Mild pain on end range of motion of the first MTPJ.  No signs of neuroma or plantar plate injury  Radiographs: X-ray of the right foot: Degenerative changes with subchondral cyst formation at the first metatarsophalangeal joint, he has hammertoes as well Assessment:   1. Right foot pain   2. PAD (peripheral artery disease) ([place])   3. Diabetic peripheral neuropathy ([place])   4. Osteoarthritis of first metatarsophalangeal (MTP) joint of right foot      Plan:  Patient was evaluated and treated and all questions answered.   Discussed with him that his pain is likely multifactorial including that he has osteoarthritis of the first metatarsophalangeal joint although clinically this does not appear to be prickly bothersome for him, he does not have pain until the very end range of motion and this is only mild.  There is no evidence of neuroma.  He does have diabetes and peripheral arterial disease.  I think this is likely early diabetic peripheral neuropathy that is pending to bother him.  I advised him that if it continues to worsen he should discuss with his primary care doctor regarding medical therapy.  If the great toe  joint begins to bother her more he is return to see me for injection and other therapies  Return if symptoms worsen or fail to improve.

## 2020-10-31 NOTE — Patient Instructions (Signed)
Diabetic Neuropathy Diabetic neuropathy refers to nerve damage that is caused by diabetes. Over time, people with diabetes can develop nerve damage throughout the body. There are several types of diabetic neuropathy:  Peripheral neuropathy. This is the most common type of diabetic neuropathy. It damages the nerves that carry signals between the spinal cord and other parts of the body (peripheral nerves). This usually affects nerves in the feet, legs, hands, and arms.  Autonomic neuropathy. This type causes damage to nerves that control involuntary functions (autonomic nerves). Involuntary functions are functions of the body that you do not control. They include heartbeat, body temperature, blood pressure, urination, digestion, sweating, sexual function, or response to changes in blood glucose.  Focal neuropathy. This type of nerve damage affects one area of the body, such as an arm, a leg, or the face. The injury may involve one nerve or a small group of nerves. Focal neuropathy can be painful and unpredictable. It occurs most often in older adults with diabetes. This often develops suddenly, but usually improves over time and does not cause long-term problems.  Proximal neuropathy. This type of nerve damage affects the nerves of the thighs, hips, buttocks, or legs. It causes severe pain, weakness, and muscle death (atrophy), usually in the thigh muscles. It is more common among older men and people who have type 2 diabetes. The length of recovery time may vary. What are the causes? Peripheral, autonomic, and focal neuropathies are caused by diabetes that is not well controlled with treatment. The cause of proximal neuropathy is not known, but it may be caused by inflammation related to uncontrolled blood glucose levels. What are the signs or symptoms? Peripheral neuropathy Peripheral neuropathy develops slowly over time. When the nerves of the feet and legs no longer work, you may  experience:  Burning, stabbing, or aching pain in the legs or feet.  Pain or cramping in the legs or feet.  Loss of feeling (numbness) and inability to feel pressure or pain in the feet. This can lead to: ? Thick calluses or sores on areas of constant pressure. ? Ulcers. ? Reduced ability to feel temperature changes.  Foot deformities.  Muscle weakness.  Loss of balance or coordination. Autonomic neuropathy The symptoms of autonomic neuropathy vary depending on which nerves are affected. Symptoms may include:  Problems with digestion, such as: ? Nausea or vomiting. ? Poor appetite. ? Bloating. ? Diarrhea or constipation. ? Trouble swallowing. ? Losing weight without trying to.  Problems with the heart, blood, and lungs, such as: ? Dizziness, especially when standing up. ? Fainting. ? Shortness of breath. ? Irregular heartbeat.  Bladder problems, such as: ? Trouble starting or stopping urination. ? Leaking urine. ? Trouble emptying the bladder. ? Urinary tract infections (UTIs).  Problems with other body functions, such as: ? Sweat. You may sweat too much or too little. ? Temperature. You might get hot easily. Or, you might feel cold more than usual. ? Sexual function. Men may not be able to get or maintain an erection. Women may have vaginal dryness and difficulty with arousal. Focal neuropathy Symptoms affect only one area of the body. Common symptoms include:  Numbness.  Tingling.  Burning pain.  Prickling feeling.  Very sensitive skin.  Weakness.  Inability to move (paralysis).  Muscle twitching.  Muscles getting smaller (wasting).  Poor coordination.  Double or blurred vision. Proximal neuropathy  Sudden, severe pain in the hip, thigh, or buttocks. Pain may spread from the back into the legs (  sciatica).  Pain and numbness in the arms and legs.  Tingling.  Loss of bladder control or bowel control.  Weakness and wasting of thigh  muscles.  Difficulty getting up from a seated position.  Abdominal swelling.  Unexplained weight loss. How is this diagnosed? Diagnosis varies depending on the type of neuropathy your health care provider suspects. Peripheral neuropathy Your health care provider will do a neurologic exam. This exam checks your reflexes, how you move, and what you can feel. You may have other tests, such as:  Blood tests.  Tests of the fluid that surrounds the spinal cord (lumbar puncture).  CT scan.  MRI.  Checking the nerves that control muscles (electromyogram, or EMG).  Checking how quickly signals pass through your nerves (nerve conduction study).  Checking a small piece of a nerve using a microscope (biopsy). Autonomic neuropathy You may have tests, such as:  Tests to measure your blood pressure and heart rate. You may be secured to an exam table that moves you from a lying position to an upright position (table tilt test).  Breathing tests to check your lungs.  Tests to check how food moves through the digestive system (gastric emptying tests).  Blood, sweat, or urine tests.  Ultrasound of your bladder.  Spinal fluid tests. Focal neuropathy This condition may be diagnosed with:  A neurologic exam.  CT scan.  MRI.  EMG.  Nerve conduction study. Proximal neuropathy There is no test to diagnose this type of neuropathy. You may have tests to rule out other possible causes of this type of neuropathy. Tests may include:  X-rays of your spine and lumbar region.  Lumbar puncture.  MRI. How is this treated? The goal of treatment is to keep nerve damage from getting worse. Treatment may include:  Following your diabetes management plan. This will help keep your blood glucose level and your A1C level within your target range. This is the most important treatment.  Using prescription pain medicine. Follow these instructions at home: Diabetes management Follow your diabetes  management plan as told by your health care provider.  Check your blood glucose levels.  Keep your blood glucose in your target range.  Have your A1C level checked at least two times a year, or as often as told.  Take over the counter and prescription medicines only as told by your health care provider. This includes insulin and diabetes medicine.   Lifestyle  Do not use any products that contain nicotine or tobacco, such as cigarettes, e-cigarettes, and chewing tobacco. If you need help quitting, ask your health care provider.  Be physically active every day. Include strength training and balance exercises.  Follow a healthy meal plan.  Work with your health care provider to manage your blood pressure.   General instructions  Ask your health care provider if the medicine prescribed to you requires you to avoid driving or using machinery.  Check your skin and feet every day for cuts, bruises, redness, blisters, or sores.  Keep all follow-up visits. This is important. Contact a health care provider if:  You have burning, stabbing, or aching pain in your legs or feet.  You are unable to feel pressure or pain in your feet.  You develop problems with digestion, such as: ? Nausea. ? Vomiting. ? Bloating. ? Constipation. ? Diarrhea. ? Abdominal pain.  You have difficulty with urination, such as: ? Inability to control when you urinate (incontinence). ? Inability to completely empty the bladder (retention).  You feel   as if your heart is racing (palpitations).  You feel dizzy, weak, or faint when you stand up. Get help right away if:  You cannot urinate.  You have sudden weakness or loss of coordination.  You have trouble speaking.  You have pain or pressure in your chest.  You have an irregular heartbeat.  You have sudden inability to move a part of your body. These symptoms may represent a serious problem that is an emergency. Do not wait to see if the symptoms  will go away. Get medical help right away. Call your local emergency services (911 in the [place] Do not drive yourself to the hospital. Summary  Diabetic neuropathy is nerve damage that is caused by diabetes. It can cause numbness and pain in the arms, legs, digestive tract, heart, and other body systems.  This condition is treated by keeping your blood glucose level and your A1C level within your target range. This can help prevent neuropathy from getting worse.  Check your skin and feet every day for cuts, bruises, redness, blisters, or sores.  Do not use any products that contain nicotine or tobacco, such as cigarettes, e-cigarettes, and chewing tobacco. This information is not intended to replace advice given to you by your health care provider. Make sure you discuss any questions you have with your health care provider. Document Revised: 01/28/2020 Document Reviewed: 01/28/2020 Elsevier Patient Education  2021 [employer]  

## 2020-11-14 IMAGING — CT CT L SPINE W/O CM
3 series · 13 of 33 positions shown, 16 images · non-contrast
Comparison: None.

CLINICAL DATA: Lumbar compression fracture

EXAM:
CT LUMBAR SPINE WITHOUT CONTRAST
TECHNIQUE: Multidetector CT imaging of the lumbar spine was performed without
intravenous contrast administration. Multiplanar CT image
reconstructions were also generated.

[Series 5: l spine 2.0 st · axial · 0.31mm/px · z∈[+966,+1118]mm · 5 of 112 slices shown, 7 images]
[im 18/112  soft-tissue]
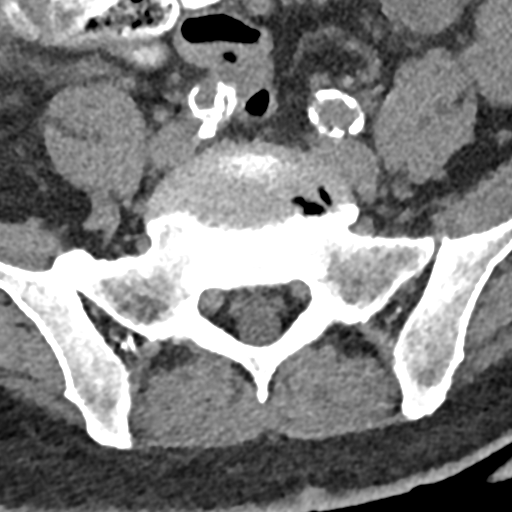
[im 18/112  bone]
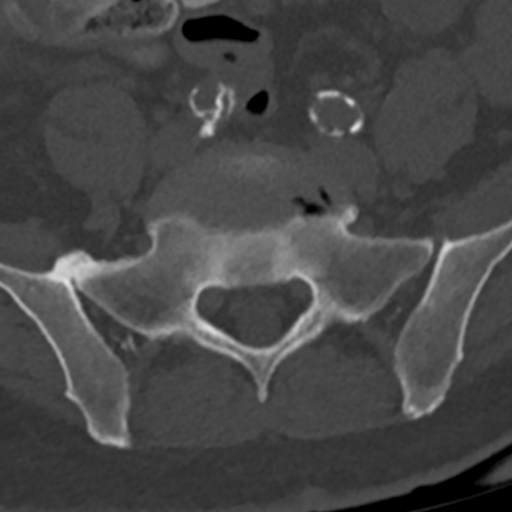
[im 35/112  bone]
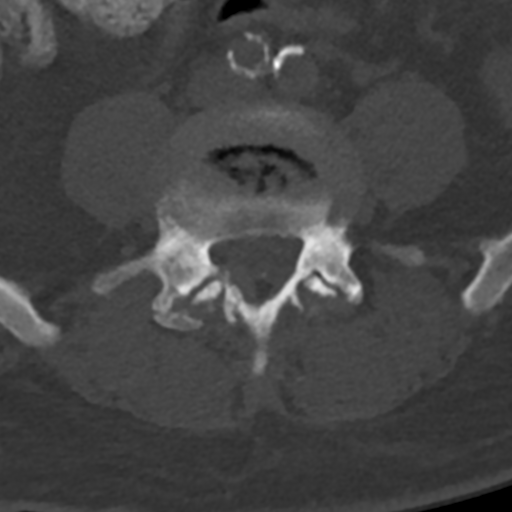
[im 60/112  bone]
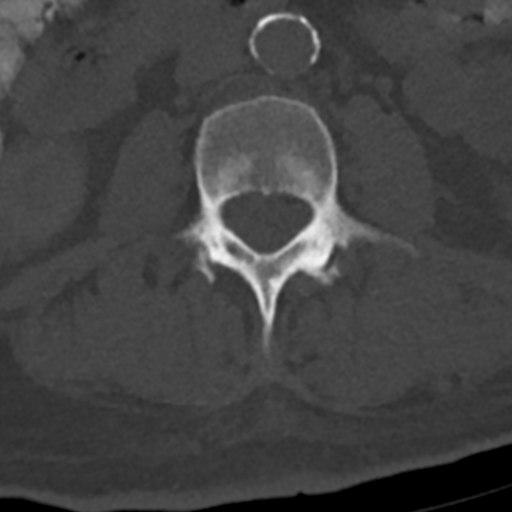
[im 77/112  bone]
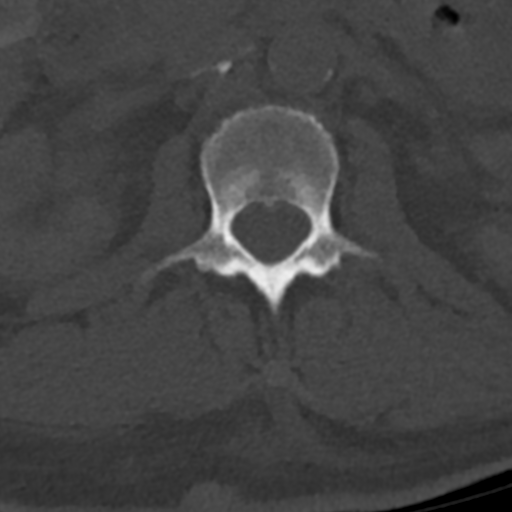
[im 94/112  soft-tissue]
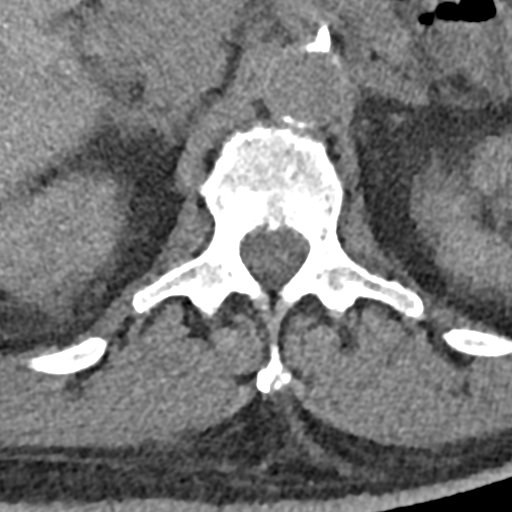
[im 94/112  bone]
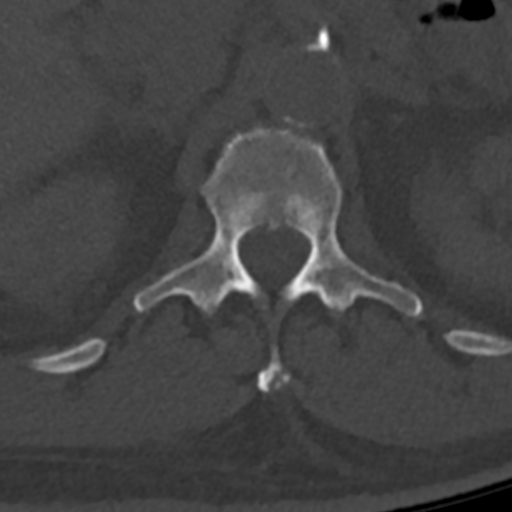

[Series 8: coronal st · coronal · 0.33mm/px · 3 of 66 slices shown]
[im 14/66  bone]
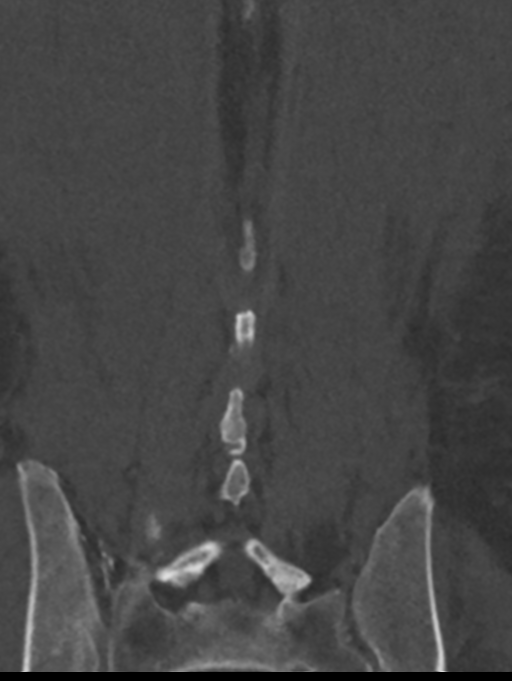
[im 27/66  bone]
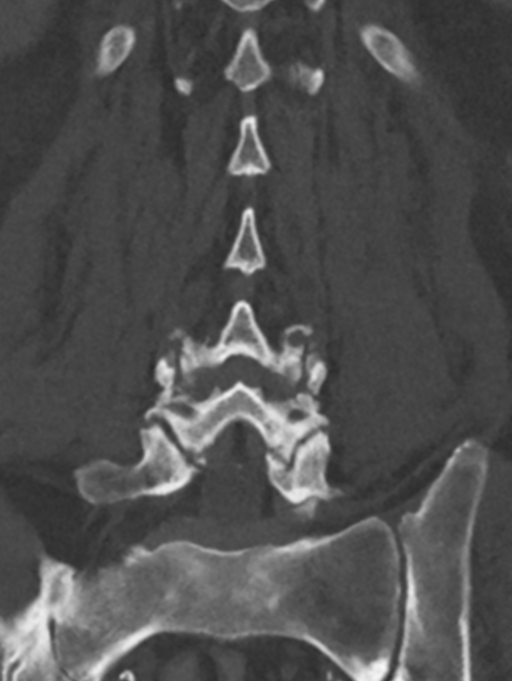
[im 40/66  bone]
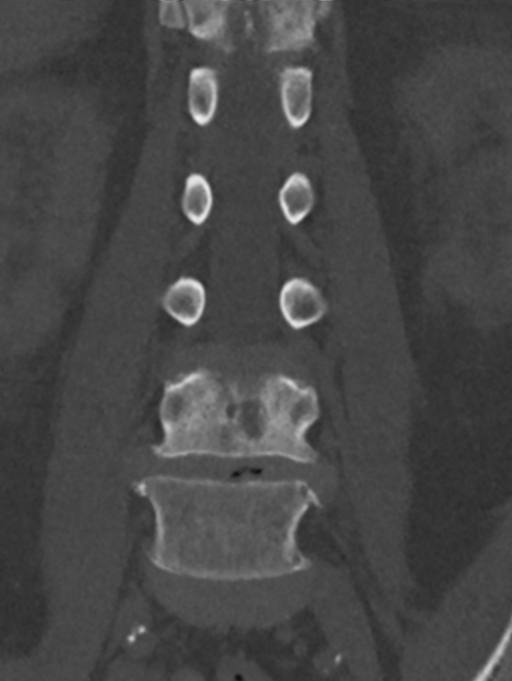

[Series 9: sagittal st · sagittal · 0.33mm/px · 5 of 71 slices shown, 6 images]
[im 24/71  bone]
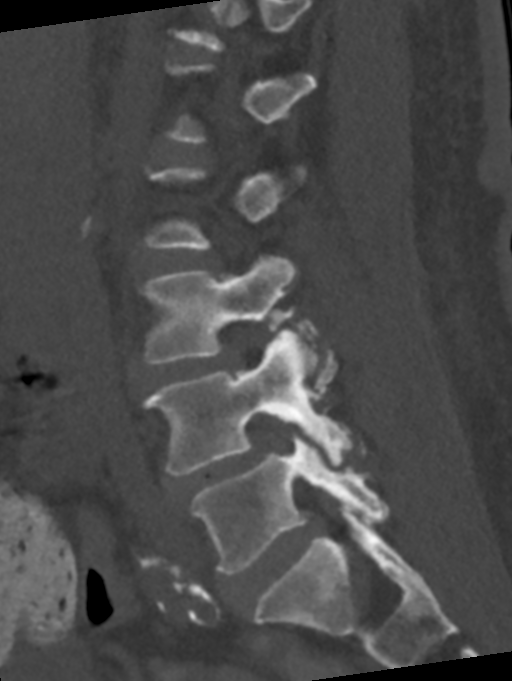
[im 30/71  bone]
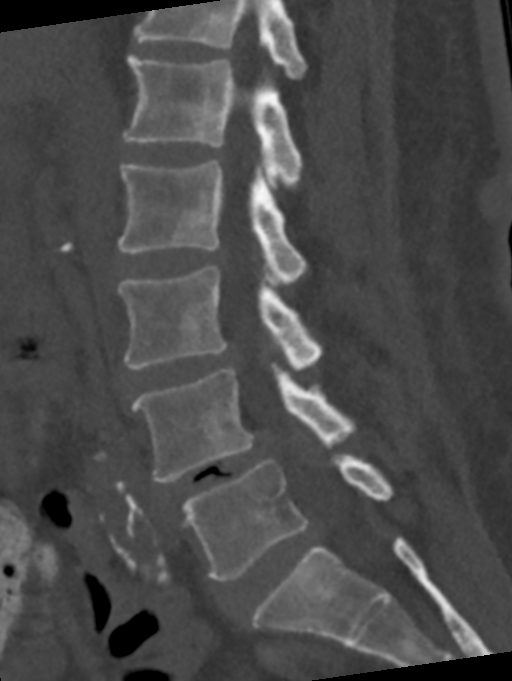
[im 36/71  soft-tissue]
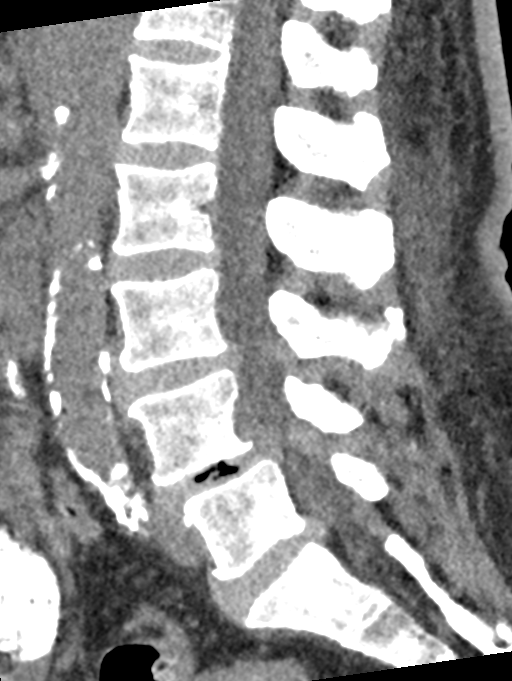
[im 36/71  bone]
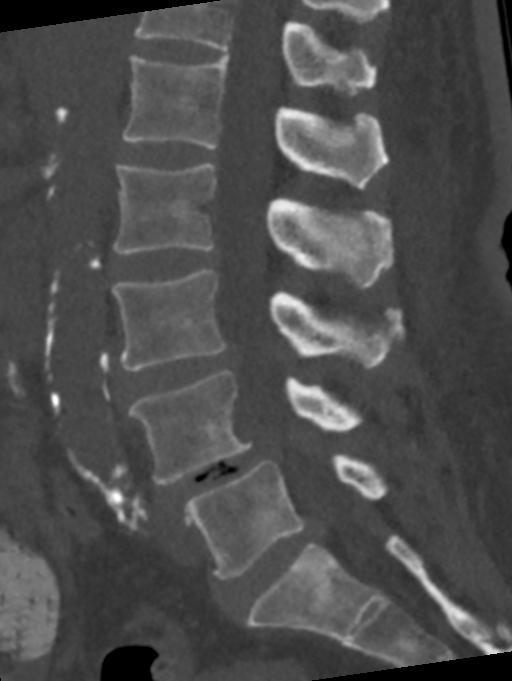
[im 41/71  bone]
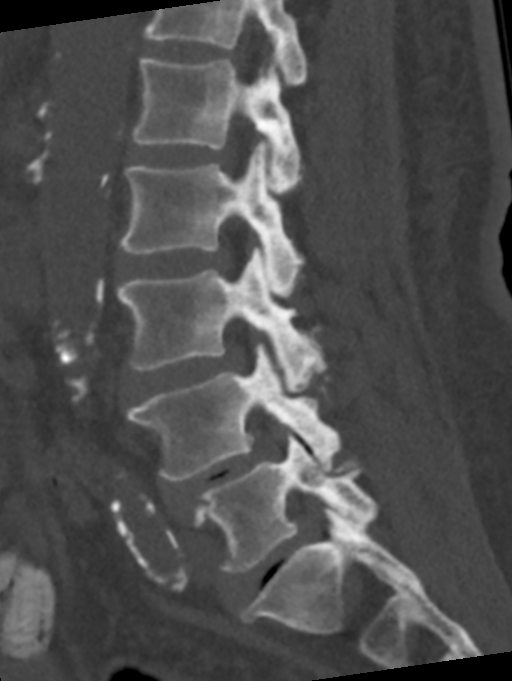
[im 47/71  bone]
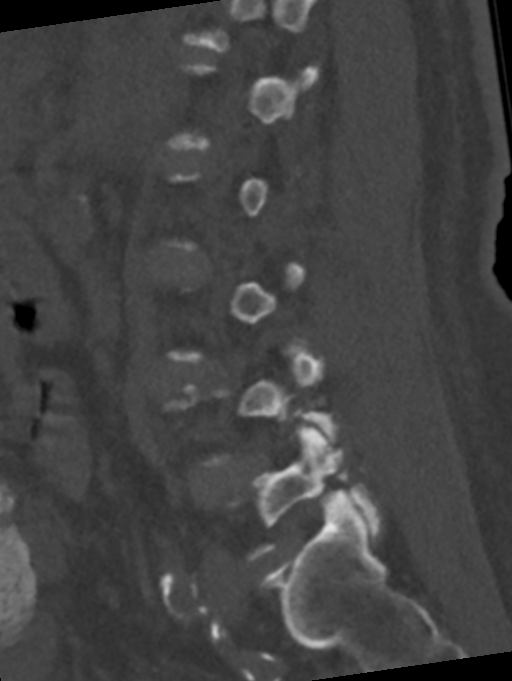

[13 of 33 positions shown; findings below may reference images not displayed]

FINDINGS: Segmentation: 5 lumbar type vertebrae

Alignment: Degenerative grade 1 anterolisthesis at L4-5

Vertebrae: No acute fracture or focal pathologic process.

Paraspinal and other soft tissues: No perispinal inflammation or
mass is seen. Diffuse atherosclerotic calcification. Intrathecal
calcifications to a limited degree at the level of L5 and S1.
Prominent density of the kidneys and contrast within bowel,
presumably a recent abdomen and pelvis CT in this patient with
evidence of peritoneal dialysis.

Disc levels:

L2-L3: Mild disc bulging.

L3-L4: Disc bulging and facet spurring with mild to moderate spinal
stenosis especially at the subarticular recesses. Moderate bilateral
foraminal narrowing.

L4-L5: Facet osteoarthritis with spurring and anterolisthesis. The
disc is narrowed and fissured with bulging. High-grade spinal
stenosis. Right more than left foraminal impingement.

L5-S1:Disc narrowing and bulging. Degenerative facet spurring on
both sides. At least moderate left foraminal impingement. Patent
spinal canal.
IMPRESSION: 1. No acute finding, including compression fracture.
2. Lumbar spine degeneration especially of the facets at L3-4 and
below, with L4-5 anterolisthesis.
3. Spinal stenosis which is high-grade at L4-5.
4. At least moderate foraminal narrowing at L3-4 and below.
5. Minimal intrathecal calcification, likely a sign of prior
arachnoiditis.

## 2020-11-16 ENCOUNTER — Other Ambulatory Visit: Payer: Self-pay

## 2020-11-16 DIAGNOSIS — N186 End stage renal disease: Secondary | ICD-10-CM

## 2020-12-05 ENCOUNTER — Other Ambulatory Visit: Payer: Self-pay

## 2020-12-05 ENCOUNTER — Ambulatory Visit (INDEPENDENT_AMBULATORY_CARE_PROVIDER_SITE_OTHER): Payer: Medicare Other | Admitting: Vascular Surgery

## 2020-12-05 ENCOUNTER — Encounter: Payer: Self-pay | Admitting: Vascular Surgery

## 2020-12-05 ENCOUNTER — Ambulatory Visit (HOSPITAL_COMMUNITY)
Admission: RE | Admit: 2020-12-05 | Discharge: 2020-12-05 | Disposition: A | Discharge status: Home / Self Care | Payer: Medicare Other | Source: Ambulatory Visit | Attending: Vascular Surgery | Admitting: Vascular Surgery

## 2020-12-05 VITALS — BP 99/60 | HR 100 | Temp 98.3°F | Resp 20 | Ht 68.0 in | Wt 178.0 lb

## 2020-12-05 DIAGNOSIS — N186 End stage renal disease: Secondary | ICD-10-CM

## 2020-12-05 DIAGNOSIS — I739 Peripheral vascular disease, unspecified: Secondary | ICD-10-CM | POA: Diagnosis not present

## 2020-12-05 DIAGNOSIS — Z992 Dependence on renal dialysis: Secondary | ICD-10-CM

## 2020-12-05 NOTE — Progress Notes (Signed)
Patient ID: Michael Blackwell, male   DOB: [birth year], 80 y.o.   MRN: [id number]  Reason for Consult: Follow-up   Referred by Rogers Blocker, MD  Subjective:     HPI:  Michael Blackwell is a 80 y.o. male on dialysis via previously placed right upper extremity AV graft.  He has also had a previous right common femoral stent placed for fourth toe ulceration which healed very quickly.  Patient cannot tolerate Plavix due to nosebleeds but he is on aspirin.  He states that his graft has recently had fistulogram with intervention.  He is having no issues with dialysis and no issues with pain or swelling.  Past Medical History:  Diagnosis Date  . Acute respiratory failure with hypoxia ([place]) 12/05/2016   [name] 12/05/2016  . Anemia   . Anxiety   . Arthritis    "joints ache at night sometimes" (3/8/20180  . Chronic kidney disease (CKD), stage II (mild)    Acute on chronic kidney disease stage II-III/notes 12/06/2016  . Dyspnea    with exertion  . ESRD (end stage renal disease) ([place])    [facility] [place]  . Heart murmur    never has caused any problems per patient  . History of blood transfusion   . History of radiation therapy 02/25/14- 04/23/14   prostate 7800 cGy 40 sessions, seminal vesicles 5600 cGy 40 sessions  . Hypercholesteremia   . Hypertension   . OSA on CPAP   . Pneumonia 12/06/2016  . Prostate cancer ([place]) 11/19/13   gleason 4+3=7, volume 34.74 cc  . Spermatocele    right  . Type II diabetes mellitus ([facility])    diet controlled  . Wears partial dentures    upper   Family History  Problem Relation Age of Onset  . Cancer Brother        brain  . Cancer Mother        stomach  . Cancer Father        ? prostate   Past Surgical History:  Procedure Laterality Date  . ABDOMINAL AORTOGRAM W/LOWER EXTREMITY N/A 07/11/2020   Procedure: ABDOMINAL AORTOGRAM W/LOWER EXTREMITY;  Surgeon: [name], MD;  Location: [facility];  Service: Cardiovascular;   Laterality: N/A;  . AV FISTULA PLACEMENT Right 03/04/2019   Procedure: RIGHT ARM ARTERIOVENOUS  FISTULA CREATION;  Surgeon: [name], MD;  Location: [facility];  Service: Vascular;  Laterality: Right;  . AV FISTULA PLACEMENT Right 12/30/2019   Procedure: INSERTION OF right  arm ARTERIOVENOUS (AV) GORE-TEX GRAFT;  Surgeon: [name], MD;  Location: [facility];  Service: Vascular;  Laterality: Right;  . BASCILIC VEIN TRANSPOSITION Right 05/26/2019   Procedure: Right Arm BASILIC VEIN TRANSPOSITION SECOND STAGE;  Surgeon: [name], MD;  Location: [facility];  Service: Vascular;  Laterality: Right;  . COLONOSCOPY    . LOWER EXTREMITY ANGIOGRAPHY Bilateral 02/25/2018   Procedure: Lower Extremity Angiography;  Surgeon: [name], MD;  Location: [facility];  Service: Cardiovascular;  Laterality: Bilateral;  limited  . PERIPHERAL VASCULAR ATHERECTOMY Right 07/11/2020   Procedure: PERIPHERAL VASCULAR ATHERECTOMY;  Surgeon: [name], MD;  Location: [facility];  Service: Cardiovascular;  Laterality: Right;  common femoral; sfa  . PERIPHERAL VASCULAR BALLOON ANGIOPLASTY Right 07/11/2020   Procedure: PERIPHERAL VASCULAR BALLOON ANGIOPLASTY;  Surgeon: [name], MD;  Location: [facility];  Service: Cardiovascular;  Laterality: Right;  [place]  . PERIPHERAL  VASCULAR INTERVENTION Right 07/11/2020   Procedure: PERIPHERAL VASCULAR INTERVENTION;  Surgeon: [name], MD;  Location: [facility];  Service: Cardiovascular;  Laterality: Right;  common femoral and superficial femoral stent  . PROSTATE BIOPSY  11/19/13   Gleason 4+3=7, vol 34.74 cc  . RENAL ANGIOGRAPHY N/A 02/25/2018   Procedure: RENAL ANGIOGRAPHY;  Surgeon: [name], MD;  Location: [facility];  Service: Cardiovascular;  Laterality: N/A;  . TONSILLECTOMY     as child    Short Social History:  Social History   Tobacco  Use  . Smoking status: Former Smoker    Years: 48.00    Types: Cigarettes    Quit date: 07/02/2019    Years since quitting: 1.4  . Smokeless tobacco: Never Used  . Tobacco comment: "I smoke 1 cig a month"- per patient on 05/25/19  Substance Use Topics  . Alcohol use: Not Currently    No Known Allergies  Current Outpatient Medications  Medication Sig Dispense Refill  . amLODipine (NORVASC) 10 MG tablet Take 5 mg by mouth at bedtime.     Marland Kitchen aspirin EC 81 MG tablet Take 81 mg by mouth at bedtime.    . B Complex-C (B-COMPLEX WITH VITAMIN C) tablet Take 1 tablet by mouth daily.    Marland Kitchen bismuth subsalicylate (PEPTO BISMOL) 262 MG/15ML suspension Take 30 mLs by mouth every 6 (six) hours as needed for indigestion (upset stomach).     . Calcium Carb-Cholecalciferol 600-800 MG-UNIT TABS Take 1 tablet by mouth daily.     . calcium carbonate (TUMS - DOSED IN MG ELEMENTAL CALCIUM) 500 MG chewable tablet Chew 1 tablet by mouth 3 (three) times daily after meals.    . cloNIDine (CATAPRES) 0.3 MG tablet Take 0.6 mg by mouth 2 (two) times daily.     . clopidogrel (PLAVIX) 75 MG tablet Take 1 tablet (75 mg total) by mouth daily. 90 tablet 3  . hydrALAZINE (APRESOLINE) 100 MG tablet Take 100 mg by mouth 3 (three) times daily.     . isosorbide mononitrate (IMDUR) 30 MG 24 hr tablet Take 30 mg by mouth every evening.     . lidocaine-prilocaine (EMLA) cream Apply 1 application topically daily as needed (prior to port being accessed).     . Omega-3 Fatty Acids (OMEGA 3 PO) Take 1,040 mg by mouth daily.     . phenylephrine (NEO-SYNEPHRINE) 1 % nasal spray Place 1 drop into both nostrils at bedtime as needed for congestion.     . polyethylene glycol (MIRALAX / GLYCOLAX) 17 g packet Take 17 g by mouth daily as needed (constipation.).    Marland Kitchen rosuvastatin (CRESTOR) 10 MG tablet Take 1 tablet (10 mg total) by mouth daily. 90 tablet 3  . sitaGLIPtin (JANUVIA) 100 MG tablet Take by mouth.    . tamsulosin (FLOMAX) 0.4 MG  CAPS capsule Take 1 capsule (0.4 mg total) by mouth every other day. (Patient taking differently: Take 0.4 mg by mouth at bedtime.)    . vitamin E 180 MG (400 UNITS) capsule Take 400 Units by mouth daily.      Current Facility-Administered Medications  Medication Dose Route Frequency Provider Last Rate Last Admin  . 0.9 %  sodium chloride infusion  250 mL Intravenous PRN [name], MD        Review of Systems  Constitutional:  Constitutional negative. HENT: HENT negative.  Eyes: Eyes negative.  Respiratory: Respiratory negative.  Cardiovascular: Cardiovascular negative.  GI: Gastrointestinal negative.  Musculoskeletal: Musculoskeletal negative.  Skin: Skin negative.  Neurological: Neurological negative. Hematologic: Hematologic/lymphatic negative.  Psychiatric: Psychiatric negative.        Objective:  Objective   Vitals:   12/05/20 1123  BP: 99/60  Pulse: 100  Resp: 20  Temp: 98.3 F (36.8 C)  SpO2: 97%  Weight: 178 lb (80.7 kg)  Height: 5\' 8"  (1.727 m)   Body mass index is 27.06 kg/m.  Physical Exam HENT:     Head: Normocephalic.     Nose:     Comments: Wearing a mask Eyes:     Pupils: Pupils are equal, round, and reactive to light.  Cardiovascular:     Pulses:          Radial pulses are 2+ on the right side and 2+ on the left side.  Pulmonary:     Effort: Pulmonary effort is normal.  Abdominal:     General: Abdomen is flat.     Palpations: Abdomen is soft.  Musculoskeletal:        General: No swelling. Normal range of motion.     Comments: Right upper arm AV has a thrill  Skin:    General: Skin is warm.     Capillary Refill: Capillary refill takes less than 2 seconds.  Neurological:     General: No focal deficit present.     Mental Status: He is alert.  Psychiatric:        Mood and Affect: Mood normal.        Behavior: Behavior normal.        Thought Content: Thought content normal.        Judgment: Judgment normal.      Data: +--------------------+----------+-----------------+------------------------  +  AVG         PSV (cm/s)Flow Vol (mL/min)    Describe        +--------------------+----------+-----------------+------------------------  +  Native artery inflow  172                           +--------------------+----------+-----------------+------------------------  +  Arterial anastomosis  326            residual lumen <42mm     +--------------------+----------+-----------------+------------------------  +  Prox graft       316                           +--------------------+----------+-----------------+------------------------  +  Mid graft        116                           +--------------------+----------+-----------------+------------------------  +  Distal graft      118                           +--------------------+----------+-----------------+------------------------  +  Venous anastomosis   331               Ratio 2.81       +--------------------+----------+-----------------+------------------------  +  Venous outflow     78           Post stenotic  turbulence  +--------------------+----------+-----------------+------------------------  +  Elevated velocities are seen at venous anastomosis with post stenotic  turbulence in the axillary vein.     Summary:  Patent arteriovenous graft.  Elevated velocities are seen at venous anastomosis with post stenotic  turbulence in the axillary vein.  Assessment/Plan:     80 year old male on dialysis via right arm AV graft.  He states that this recently had fistulogram with intervention.  From that standpoint if there are any further issues we can evaluate with further fistulogram without any further duplex as there is  some elevated velocities at his venous anastomosis today.  He is to follow-up with [place] in a few months to evaluate his right lower extremity with previous stenting.  He will continue aspirin as he could not tolerate Plavix due to nosebleeds     [name] MD [facility]

## 2020-12-26 ENCOUNTER — Ambulatory Visit: Payer: Medicare Other | Admitting: Podiatry

## 2020-12-30 ENCOUNTER — Telehealth: Payer: Self-pay

## 2020-12-30 NOTE — Telephone Encounter (Signed)
Received referral from Dr. 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 Claretha Cooper note on 12/05/20, fistulagram for further eval - To [facility] to schedule

## 2021-01-03 ENCOUNTER — Other Ambulatory Visit: Payer: Self-pay

## 2021-01-03 NOTE — Telephone Encounter (Signed)
Received call from referring office/Dr. Coladonato's office stating services are no longer needed due pts issue has resolved. Contacted pt and advised procedure has been canceled. Pt verbalized understanding.

## 2021-01-03 NOTE — Telephone Encounter (Signed)
Contacted pt to schedule fistulogram. Advised Dr. Claretha Cooper next available date was 01/30/21. Offered earlier appointments with providers pt has previously seen. Pt agreed to schedule with Dr. Oneida Alar on 01/06/21.

## 2021-01-04 ENCOUNTER — Other Ambulatory Visit (HOSPITAL_COMMUNITY): Payer: Medicare Other

## 2021-01-06 ENCOUNTER — Ambulatory Visit (HOSPITAL_COMMUNITY): Admit: 2021-01-06 | Payer: Medicare Other | Admitting: Vascular Surgery

## 2021-01-06 ENCOUNTER — Encounter (HOSPITAL_COMMUNITY): Payer: Self-pay

## 2021-01-06 SURGERY — A/V FISTULAGRAM
Anesthesia: LOCAL

## 2021-02-17 ENCOUNTER — Ambulatory Visit (INDEPENDENT_AMBULATORY_CARE_PROVIDER_SITE_OTHER): Payer: Medicare Other | Admitting: Podiatry

## 2021-02-17 ENCOUNTER — Other Ambulatory Visit: Payer: Self-pay

## 2021-02-17 DIAGNOSIS — I739 Peripheral vascular disease, unspecified: Secondary | ICD-10-CM | POA: Diagnosis not present

## 2021-02-17 DIAGNOSIS — M79671 Pain in right foot: Secondary | ICD-10-CM

## 2021-02-17 DIAGNOSIS — E1142 Type 2 diabetes mellitus with diabetic polyneuropathy: Secondary | ICD-10-CM | POA: Diagnosis not present

## 2021-02-17 MED ORDER — GABAPENTIN 100 MG PO CAPS: 100.0000 mg | ORAL_CAPSULE | Freq: Three times a day (TID) | ORAL | 3 refills | Status: AC

## 2021-02-17 NOTE — Progress Notes (Signed)
Subjective:   Patient ID: Michael Blackwell, male   DOB: 80 y.o.   MRN: [id number]   HPI Patient presents stating still getting a lot of burning tingling in his feet and he has had his circulation worked on but it seems to only be helping him so much   ROS      Objective:  Physical Exam  Vascular status diminished but he does have a doctor who follows this along with reduction of sharp dull vibratory bilateral     Assessment:  Difficult to determine between vascular disease or possible neuropathy or combination of the 2     Plan:  H&P reviewed both conditions and at this point we will get a start him on low-dose gabapentin and that can be increased in the future as needed.  Patient will be seen back for [place] to recheck is encouraged to call with questions and we will start the medicine slowly with 1 at night and then gradually increasing over a 6-week.

## 2021-02-20 ENCOUNTER — Ambulatory Visit: Payer: Medicare Other | Admitting: Podiatry

## 2021-02-27 ENCOUNTER — Emergency Department (HOSPITAL_COMMUNITY): Payer: Medicare Other

## 2021-02-27 ENCOUNTER — Other Ambulatory Visit: Payer: Self-pay

## 2021-02-27 ENCOUNTER — Inpatient Hospital Stay (HOSPITAL_COMMUNITY)
Admission: EM | Admit: 2021-02-27 | Discharge: 2021-03-02 | DRG: 270 | Disposition: A | Discharge status: Home / Self Care | Payer: Medicare Other | Attending: Internal Medicine | Admitting: Internal Medicine

## 2021-02-27 DIAGNOSIS — Z808 Family history of malignant neoplasm of other organs or systems: Secondary | ICD-10-CM | POA: Diagnosis not present

## 2021-02-27 DIAGNOSIS — E785 Hyperlipidemia, unspecified: Secondary | ICD-10-CM | POA: Diagnosis present

## 2021-02-27 DIAGNOSIS — E1122 Type 2 diabetes mellitus with diabetic chronic kidney disease: Secondary | ICD-10-CM | POA: Diagnosis present

## 2021-02-27 DIAGNOSIS — Y838 Other surgical procedures as the cause of abnormal reaction of the patient, or of later complication, without mention of misadventure at the time of the procedure: Secondary | ICD-10-CM | POA: Diagnosis present

## 2021-02-27 DIAGNOSIS — Z992 Dependence on renal dialysis: Secondary | ICD-10-CM | POA: Diagnosis not present

## 2021-02-27 DIAGNOSIS — I70221 Atherosclerosis of native arteries of extremities with rest pain, right leg: Secondary | ICD-10-CM | POA: Diagnosis present

## 2021-02-27 DIAGNOSIS — I70202 Unspecified atherosclerosis of native arteries of extremities, left leg: Secondary | ICD-10-CM | POA: Diagnosis present

## 2021-02-27 DIAGNOSIS — T82856A Stenosis of peripheral vascular stent, initial encounter: Principal | ICD-10-CM | POA: Diagnosis present

## 2021-02-27 DIAGNOSIS — Z923 Personal history of irradiation: Secondary | ICD-10-CM

## 2021-02-27 DIAGNOSIS — N2581 Secondary hyperparathyroidism of renal origin: Secondary | ICD-10-CM | POA: Diagnosis not present

## 2021-02-27 DIAGNOSIS — N186 End stage renal disease: Secondary | ICD-10-CM | POA: Diagnosis not present

## 2021-02-27 DIAGNOSIS — I700 Atherosclerosis of aorta: Secondary | ICD-10-CM | POA: Diagnosis not present

## 2021-02-27 DIAGNOSIS — Z800 Family history of malignant neoplasm of digestive organs: Secondary | ICD-10-CM | POA: Diagnosis not present

## 2021-02-27 DIAGNOSIS — Z95820 Peripheral vascular angioplasty status with implants and grafts: Secondary | ICD-10-CM | POA: Diagnosis not present

## 2021-02-27 DIAGNOSIS — Z87891 Personal history of nicotine dependence: Secondary | ICD-10-CM

## 2021-02-27 DIAGNOSIS — E1151 Type 2 diabetes mellitus with diabetic peripheral angiopathy without gangrene: Secondary | ICD-10-CM | POA: Diagnosis not present

## 2021-02-27 DIAGNOSIS — Z01810 Encounter for preprocedural cardiovascular examination: Secondary | ICD-10-CM | POA: Diagnosis not present

## 2021-02-27 DIAGNOSIS — Z20822 Contact with and (suspected) exposure to covid-19: Secondary | ICD-10-CM | POA: Diagnosis present

## 2021-02-27 DIAGNOSIS — Z8546 Personal history of malignant neoplasm of prostate: Secondary | ICD-10-CM

## 2021-02-27 DIAGNOSIS — E7800 Pure hypercholesterolemia, unspecified: Secondary | ICD-10-CM | POA: Diagnosis present

## 2021-02-27 DIAGNOSIS — G4733 Obstructive sleep apnea (adult) (pediatric): Secondary | ICD-10-CM

## 2021-02-27 DIAGNOSIS — I2510 Atherosclerotic heart disease of native coronary artery without angina pectoris: Secondary | ICD-10-CM | POA: Diagnosis present

## 2021-02-27 DIAGNOSIS — Z7902 Long term (current) use of antithrombotics/antiplatelets: Secondary | ICD-10-CM

## 2021-02-27 DIAGNOSIS — D631 Anemia in chronic kidney disease: Secondary | ICD-10-CM | POA: Diagnosis not present

## 2021-02-27 DIAGNOSIS — Z79899 Other long term (current) drug therapy: Secondary | ICD-10-CM | POA: Diagnosis not present

## 2021-02-27 DIAGNOSIS — I120 Hypertensive chronic kidney disease with stage 5 chronic kidney disease or end stage renal disease: Secondary | ICD-10-CM | POA: Diagnosis present

## 2021-02-27 DIAGNOSIS — F419 Anxiety disorder, unspecified: Secondary | ICD-10-CM | POA: Diagnosis present

## 2021-02-27 DIAGNOSIS — Z7982 Long term (current) use of aspirin: Secondary | ICD-10-CM | POA: Diagnosis not present

## 2021-02-27 DIAGNOSIS — I10 Essential (primary) hypertension: Secondary | ICD-10-CM | POA: Diagnosis present

## 2021-02-27 DIAGNOSIS — I998 Other disorder of circulatory system: Secondary | ICD-10-CM | POA: Diagnosis present

## 2021-02-27 DIAGNOSIS — Z7984 Long term (current) use of oral hypoglycemic drugs: Secondary | ICD-10-CM

## 2021-02-27 DIAGNOSIS — E1129 Type 2 diabetes mellitus with other diabetic kidney complication: Secondary | ICD-10-CM | POA: Diagnosis present

## 2021-02-27 DIAGNOSIS — M1990 Unspecified osteoarthritis, unspecified site: Secondary | ICD-10-CM | POA: Diagnosis present

## 2021-02-27 LAB — RESP PANEL BY RT-PCR (FLU A&B, COVID) ARPGX2
Influenza A by PCR: NEGATIVE
Influenza B by PCR: NEGATIVE
SARS Coronavirus 2 by RT PCR: NEGATIVE

## 2021-02-27 LAB — CBG MONITORING, ED: Glucose-Capillary: 101 mg/dL — ABNORMAL HIGH (ref 70–99)

## 2021-02-27 LAB — CBC WITH DIFFERENTIAL/PLATELET
Abs Immature Granulocytes: 0.03 10*3/uL (ref 0.00–0.07)
Basophils Absolute: 0.1 10*3/uL (ref 0.0–0.1)
Basophils Relative: 1 %
Eosinophils Absolute: 0.1 10*3/uL (ref 0.0–0.5)
Eosinophils Relative: 1 %
HCT: 38.8 % — ABNORMAL LOW (ref 39.0–52.0)
Hemoglobin: 12.4 g/dL — ABNORMAL LOW (ref 13.0–17.0)
Immature Granulocytes: 0 %
Lymphocytes Relative: 15 %
Lymphs Abs: 1.1 10*3/uL (ref 0.7–4.0)
MCH: 29.2 pg (ref 26.0–34.0)
MCHC: 32 g/dL (ref 30.0–36.0)
MCV: 91.5 fL (ref 80.0–100.0)
Monocytes Absolute: 0.6 10*3/uL (ref 0.1–1.0)
Monocytes Relative: 8 %
Neutro Abs: 5.7 10*3/uL (ref 1.7–7.7)
Neutrophils Relative %: 75 %
Platelets: 199 10*3/uL (ref 150–400)
RBC: 4.24 MIL/uL (ref 4.22–5.81)
RDW: 16.6 % — ABNORMAL HIGH (ref 11.5–15.5)
WBC: 7.6 10*3/uL (ref 4.0–10.5)
nRBC: 0 % (ref 0.0–0.2)

## 2021-02-27 LAB — COMPREHENSIVE METABOLIC PANEL
ALT: 13 U/L (ref 0–44)
AST: 17 U/L (ref 15–41)
Albumin: 3.8 g/dL (ref 3.5–5.0)
Alkaline Phosphatase: 68 U/L (ref 38–126)
Anion gap: 15 (ref 5–15)
BUN: 32 mg/dL — ABNORMAL HIGH (ref 8–23)
CO2: 31 mmol/L (ref 22–32)
Calcium: 9 mg/dL (ref 8.9–10.3)
Chloride: 90 mmol/L — ABNORMAL LOW (ref 98–111)
Creatinine, Ser: 6.1 mg/dL — ABNORMAL HIGH (ref 0.61–1.24)
GFR, Estimated: 9 mL/min — ABNORMAL LOW (ref >60)
Glucose, Bld: 119 mg/dL — ABNORMAL HIGH (ref 70–99)
Potassium: 4 mmol/L (ref 3.5–5.1)
Sodium: 136 mmol/L (ref 135–145)
Total Bilirubin: 0.5 mg/dL (ref 0.3–1.2)
Total Protein: 7.8 g/dL (ref 6.5–8.1)

## 2021-02-27 LAB — PROTIME-INR
INR: 1.1 (ref 0.8–1.2)
Prothrombin Time: 14 seconds (ref 11.4–15.2)

## 2021-02-27 LAB — LACTIC ACID, PLASMA: Lactic Acid, Venous: 1.3 mmol/L (ref 0.5–1.9)

## 2021-02-27 LAB — APTT: aPTT: 31 seconds (ref 24–36)

## 2021-02-27 LAB — PROCALCITONIN: Procalcitonin: 0.45 ng/mL

## 2021-02-27 MED ORDER — INSULIN ASPART 100 UNIT/ML IJ SOLN: 0.0000 [IU] | Freq: Every day | INTRAMUSCULAR | Status: DC

## 2021-02-27 MED ORDER — TAMSULOSIN HCL 0.4 MG PO CAPS
0.4000 mg | ORAL_CAPSULE | Freq: Every day | ORAL | Status: DC
  Administered 2021-02-27 – 2021-02-28 (×2): 0.4 mg via ORAL
  Filled 2021-02-27 (×2): qty 1

## 2021-02-27 MED ORDER — LACTATED RINGERS IV BOLUS (SEPSIS)
1000.0000 mL | Freq: Once | INTRAVENOUS | Status: AC
  Administered 2021-02-27: 1000 mL via INTRAVENOUS

## 2021-02-27 MED ORDER — ACETAMINOPHEN 650 MG RE SUPP: 650.0000 mg | Freq: Four times a day (QID) | RECTAL | Status: DC | PRN

## 2021-02-27 MED ORDER — HEPARIN SODIUM (PORCINE) 5000 UNIT/ML IJ SOLN
5000.0000 [IU] | Freq: Three times a day (TID) | INTRAMUSCULAR | Status: DC
  Administered 2021-02-27 – 2021-02-28 (×4): 5000 [IU] via SUBCUTANEOUS
  Filled 2021-02-27 (×4): qty 1

## 2021-02-27 MED ORDER — ACETAMINOPHEN 325 MG PO TABS: 650.0000 mg | ORAL_TABLET | Freq: Four times a day (QID) | ORAL | Status: DC | PRN

## 2021-02-27 MED ORDER — ACETAMINOPHEN 325 MG PO TABS
650.0000 mg | ORAL_TABLET | Freq: Once | ORAL | Status: AC
  Administered 2021-02-27: 650 mg via ORAL
  Filled 2021-02-27: qty 2

## 2021-02-27 MED ORDER — ROSUVASTATIN CALCIUM 5 MG PO TABS
10.0000 mg | ORAL_TABLET | Freq: Every day | ORAL | Status: DC
  Administered 2021-03-01: 10 mg via ORAL
  Filled 2021-02-27 (×2): qty 2

## 2021-02-27 MED ORDER — LACTATED RINGERS IV BOLUS (SEPSIS): 500.0000 mL | Freq: Once | INTRAVENOUS | Status: DC

## 2021-02-27 MED ORDER — INSULIN ASPART 100 UNIT/ML IJ SOLN
0.0000 [IU] | Freq: Three times a day (TID) | INTRAMUSCULAR | Status: DC
  Administered 2021-02-28: 1 [IU] via SUBCUTANEOUS

## 2021-02-27 NOTE — Consult Note (Signed)
Hospital Consult    Reason for Consult: Right foot rest pain Referring Physician: ED, Dr. Eulis Foster MRN #:  [id number]  History of Present Illness: This is a 80 y.o. male with multiple medical comorbidities including end-stage renal disease, hypertension, hyperlipidemia, diabetes, peripheral arterial disease that vascular surgery has been consulted for rest pain in the right foot.  Patient presented to the ED with pain in the right foot that has been ongoing for the last several weeks.  Most of his pain is in the right great toe.  He also has some discoloration of the toe itself.    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 Donzetta Matters.  He had jetstream atherectomy of the right common femoral and SFA and then stenting of the right common femoral and proximal SFA with balloon angioplasty of the right profunda.  States his right foot has never been the same however this has gotten much worse over the last several weeks.  Past Medical History:  Diagnosis Date  . Acute respiratory failure with hypoxia ([place]) 12/05/2016   [name] 12/05/2016  . Anemia   . Anxiety   . Arthritis    "joints ache at night sometimes" (3/8/20180  . Chronic kidney disease (CKD), stage II (mild)    Acute on chronic kidney disease stage II-III/notes 12/06/2016  . Dyspnea    with exertion  . ESRD (end stage renal disease) ([place])    [facility] [place]  . Heart murmur    never has caused any problems per patient  . History of blood transfusion   . History of radiation therapy 02/25/14- 04/23/14   prostate 7800 cGy 40 sessions, seminal vesicles 5600 cGy 40 sessions  . Hypercholesteremia   . Hypertension   . OSA on CPAP   . Pneumonia 12/06/2016  . Prostate cancer ([place]) 11/19/13   gleason 4+3=7, volume 34.74 cc  . Spermatocele    right  . Type II diabetes mellitus (H[facility])    diet controlled  . Wears partial dentures    upper     Past Surgical History:  Procedure Laterality Date  . ABDOMINAL AORTOGRAM W/LOWER EXTREMITY N/A 07/11/2020   Procedure: ABDOMINAL AORTOGRAM W/LOWER EXTREMITY;  Surgeon: [name], MD;  Location: [facility];  Service: Cardiovascular;  Laterality: N/A;  . AV FISTULA PLACEMENT Right 03/04/2019   Procedure: RIGHT ARM ARTERIOVENOUS  FISTULA CREATION;  Surgeon: [name], MD;  Location: [facility];  Service: Vascular;  Laterality: Right;  . AV FISTULA PLACEMENT Right 12/30/2019   Procedure: INSERTION OF right  arm ARTERIOVENOUS (AV) GORE-TEX GRAFT;  Surgeon: [name], MD;  Location: [facility];  Service: Vascular;  Laterality: Right;  . BASCILIC VEIN TRANSPOSITION Right 05/26/2019   Procedure: Right Arm BASILIC VEIN TRANSPOSITION SECOND STAGE;  Surgeon: [name], MD;  Location: [facility];  Service: Vascular;  Laterality: Right;  . COLONOSCOPY    . LOWER EXTREMITY ANGIOGRAPHY Bilateral 02/25/2018   Procedure: Lower Extremity Angiography;  Surgeon: [name], MD;  Location: [facility];  Service: Cardiovascular;  Laterality: Bilateral;  limited  . PERIPHERAL VASCULAR ATHERECTOMY Right 07/11/2020   Procedure: PERIPHERAL VASCULAR ATHERECTOMY;  Surgeon: [name], MD;  Location: [facility];  Service: Cardiovascular;  Laterality: Right;  common femoral; sfa  . PERIPHERAL VASCULAR BALLOON ANGIOPLASTY Right 07/11/2020   Procedure: PERIPHERAL VASCULAR BALLOON ANGIOPLASTY;  Surgeon: [name], MD;  Location: [facility];  Service: Cardiovascular;  Laterality: Right;  [employer]  . PERIPHERAL VASCULAR INTERVENTION Right 07/11/2020   Procedure: PERIPHERAL VASCULAR INTERVENTION;  Surgeon: [name], MD;  Location: [facility];  Service: Cardiovascular;  Laterality: Right;  common femoral and superficial femoral stent  . PROSTATE BIOPSY  11/19/13   Gleason 4+3=7, vol 34.74 cc   . RENAL ANGIOGRAPHY N/A 02/25/2018   Procedure: RENAL ANGIOGRAPHY;  Surgeon: [name], MD;  Location: [facility];  Service: Cardiovascular;  Laterality: N/A;  . TONSILLECTOMY     as child    No Known Allergies  Prior to Admission medications   Medication Sig Start Date End Date Taking? Authorizing Provider  amLODipine (NORVASC) 10 MG tablet Take 5 mg by mouth at bedtime.     [provider]  aspirin EC 81 MG tablet Take 81 mg by mouth at bedtime.    [provider]  B Complex-C (B-COMPLEX WITH VITAMIN C) tablet Take 1 tablet by mouth daily.    [provider]  bismuth subsalicylate (PEPTO BISMOL) 262 MG/15ML suspension Take 30 mLs by mouth every 6 (six) hours as needed for indigestion (upset stomach).     [provider]  Calcium Carb-Cholecalciferol 600-800 MG-UNIT TABS Take 1 tablet by mouth daily.     [provider]  calcium carbonate (TUMS - DOSED IN MG ELEMENTAL CALCIUM) 500 MG chewable tablet Chew 1 tablet by mouth 3 (three) times daily after meals.    [provider]  cloNIDine (CATAPRES) 0.3 MG tablet Take 0.6 mg by mouth 2 (two) times daily.  12/12/15   [provider]  clopidogrel (PLAVIX) 75 MG tablet Take 1 tablet (75 mg total) by mouth daily. 08/05/20 08/05/21  Dagoberto Ligas, PA-C  gabapentin (NEURONTIN) 100 MG capsule Take 1 capsule (100 mg total) by mouth 3 (three) times daily. 02/17/21   Wallene Huh, DPM  hydrALAZINE (APRESOLINE) 100 MG tablet Take 100 mg by mouth 3 (three) times daily.     [provider]  isosorbide mononitrate (IMDUR) 30 MG 24 hr tablet Take 30 mg by mouth every evening.     [provider]  lidocaine-prilocaine (EMLA) cream Apply 1 application topically daily as needed (prior to port being accessed).  02/22/20   [provider]  Omega-3 Fatty Acids (OMEGA 3 PO) Take 1,040 mg by mouth daily.     [provider]  phenylephrine  (NEO-SYNEPHRINE) 1 % nasal spray Place 1 drop into both nostrils at bedtime as needed for congestion.     [provider]  polyethylene glycol (MIRALAX / GLYCOLAX) 17 g packet Take 17 g by mouth daily as needed (constipation.).    [provider]  rosuvastatin (CRESTOR) 10 MG tablet Take 1 tablet (10 mg total) by mouth daily. 06/21/20   Adrian Prows, MD  sitaGLIPtin (JANUVIA) 100 MG tablet Take by mouth. 07/06/20   [provider]  tamsulosin (FLOMAX) 0.4 MG CAPS capsule Take 1 capsule (0.4 mg total) by mouth every other day. Patient taking differently: Take 0.4 mg by mouth at bedtime. 11/16/19   Aline August, MD  vitamin E 180 MG (400 UNITS) capsule Take 400 Units by mouth daily.     [provider]    Social History   Socioeconomic History  . Marital status: Married    Spouse name: Not on file  . Number of children: Not on file  . Years of education: Not on file  .  Highest education level: Not on file  Occupational History  . Not on file  Tobacco Use  . Smoking status: Former Smoker    Years: 48.00    Types: Cigarettes    Quit date: 07/02/2019    Years since quitting: 1.6  . Smokeless tobacco: Never Used  . Tobacco comment: "I smoke 1 cig a month"- per patient on 05/25/19  Vaping Use  . Vaping Use: Never used  Substance and Sexual Activity  . Alcohol use: Not Currently  . Drug use: No  . Sexual activity: Yes  Other Topics Concern  . Not on file  Social History Narrative  . Not on file   Social Determinants of Health   Financial Resource Strain: Not on file  Food Insecurity: Not on file  Transportation Needs: Not on file  Physical Activity: Not on file  Stress: Not on file  Social Connections: Not on file  Intimate Partner Violence: Not on file     Family History  Problem Relation Age of Onset  . Cancer Brother        brain  . Cancer Mother        stomach  . Cancer Father        ? prostate    ROS: [x]  Positive   [ ]  Negative    [ ]  All sytems reviewed and are negative  Cardiovascular: []  chest pain/pressure []  palpitations []  SOB lying flat []  DOE []  pain in legs while walking [x]  pain in legs at rest - right foot [x]  pain in legs at night - right foot []  non-healing ulcers []  hx of DVT []  swelling in legs  Pulmonary: []  productive cough []  asthma/wheezing []  home O2  Neurologic: []  weakness in []  arms []  legs []  numbness in []  arms []  legs []  hx of CVA []  mini stroke [] difficulty speaking or slurred speech []  temporary loss of vision in one eye []  dizziness  Hematologic: []  hx of cancer []  bleeding problems []  problems with blood clotting easily  Endocrine:   []  diabetes []  thyroid disease  GI []  vomiting blood []  blood in stool  GU: []  CKD/renal failure []  HD--[]  M/W/F or []  T/T/S []  burning with urination []  blood in urine  Psychiatric: []  anxiety []  depression  Musculoskeletal: []  arthritis []  joint pain  Integumentary: []  rashes []  ulcers  Constitutional: []  fever []  chills   Physical Examination  Vitals:   02/27/21 1807 02/27/21 1823  BP: (!) 83/47 (!) 136/91  Pulse: (!) 105 94  Resp: 18 10  Temp: 100.1 F (37.8 C) 98.6 F (37 C)  SpO2: 96% 98%   Body mass index is 25.54 kg/m.  General:  NAD Gait: Not observed HENT: WNL, normocephalic Pulmonary: normal non-labored breathing Cardiac: regular, without  Murmurs, rubs or gallops Abdomen: soft, NT/ND Vascular Exam/Pulses: Right femoral pulse palpable Left femoral pulse weakly palpable higher Right PT monophasic Right dorsalis pedis signal sounds venous Ischemic changes to the right great toe Right upper arm AV graft with good thrill Musculoskeletal: no muscle wasting or atrophy  Neurologic: A&O X 3; Appropriate Affect ; SENSATION: normal; MOTOR FUNCTION:  moving all extremities equally. Speech is fluent/normal      CBC    Component Value Date/Time   WBC 8.0 07/15/2020 2019   RBC 3.77 (L)  07/15/2020 2019   HGB 10.4 (L) 07/15/2020 2019   HGB 10.5 (L) 03/01/2016 1120   HCT 33.2 (L) 07/15/2020 2019   HCT 32.5 (L) 03/01/2016 1120  PLT 163 07/15/2020 2019   PLT 254 03/01/2016 1120   MCV 88.1 07/15/2020 2019   MCV 82.5 03/01/2016 1120   MCH 27.6 07/15/2020 2019   MCHC 31.3 07/15/2020 2019   RDW 18.4 (H) 07/15/2020 2019   RDW 16.3 (H) 03/01/2016 1120   LYMPHSABS 1.2 07/15/2020 2019   LYMPHSABS 1.8 03/01/2016 1120   MONOABS 0.8 07/15/2020 2019   MONOABS 0.4 03/01/2016 1120   EOSABS 0.1 07/15/2020 2019   EOSABS 0.1 03/01/2016 1120   BASOSABS 0.0 07/15/2020 2019   BASOSABS 0.0 03/01/2016 1120    BMET    Component Value Date/Time   NA 139 07/15/2020 2019   NA 141 03/01/2016 1120   K 3.9 07/15/2020 2019   K 3.6 03/01/2016 1120   CL 96 (L) 07/15/2020 2019   CO2 24 07/15/2020 2019   CO2 25 03/01/2016 1120   GLUCOSE 139 (H) 07/15/2020 2019   GLUCOSE 151 (H) 03/01/2016 1120   BUN 67 (H) 07/15/2020 2019   BUN 17.1 03/01/2016 1120   CREATININE 13.29 (H) 07/15/2020 2019   CREATININE 1.5 (H) 03/01/2016 1120   CALCIUM 8.9 07/15/2020 2019   CALCIUM 9.2 09/08/2019 0823   CALCIUM 9.2 03/01/2016 1120   GFRNONAA 3 (L) 07/15/2020 2019   GFRAA 5 (L) 11/16/2019 0252    COAGS: Lab Results  Component Value Date   INR 1.1 04/06/2019   INR 1.04 02/25/2018     Non-Invasive Vascular Imaging:    No new imaging to review   ASSESSMENT/PLAN: This is a 80 y.o. male multiple medical problems that presents with worsening CLI with rest pain over the last several weeks in the right foot.  He had critical limb ischemia in the past and previously had a right common femoral and SFA arthrectomy with stenting of the right common femoral and proximal SFA and balloon angioplasty of the right profunda by Dr. Donzetta Matters in 07/2020.  He states his foot has never been the same and its unclear to me if this is worsening chronic disease or acute on chronic event.  He does have a right femoral pulse and  monophasic PT signal at the right ankle.  I have recommended aortogram with right lower extremity arteriogram and possible intervention tomorrow with Dr. Trula Slade.  Please keep the patient n.p.o. after midnight.  May ultimately require open surgical intervention pending findings on the arteriogram.   [name], MD [facility] Office: [place]

## 2021-02-27 NOTE — ED Notes (Signed)
Pt advises that rectal temperature was taken on last shift.

## 2021-02-27 NOTE — ED Provider Notes (Signed)
Emergency Medicine Provider Triage Evaluation Note  Michael Blackwell , a 80 y.o. male  was evaluated in triage.  Pt complains of pain to the right foot ongoing for months.  Review of Systems  Positive: Right foot pain Negative: fever  Physical Exam  BP (!) 83/47 (BP Location: Left Arm)   Pulse (!) 105   Temp 100.1 F (37.8 C) (Oral)   Resp 18   Ht 5\' 8"  (1.727 m)   Wt 76.2 kg   SpO2 96%   BMI 25.54 kg/m  Gen:   Awake, no distress   Resp:  Normal effort  MSK:   Moves extremities without difficulty  Other:  Right foot pain, erythema, warmth and edema.   Medical Decision Making  Medically screening exam initiated at 6:12 PM.  Appropriate orders placed.  Lucrezia Europe was informed that the remainder of the evaluation will be completed by another provider, this initial triage assessment does not replace that evaluation, and the importance of remaining in the ED until their evaluation is complete.     Bishop Dublin 02/27/21 1815    Tegeler, Gwenyth Allegra, MD 02/27/21 1901

## 2021-02-27 NOTE — ED Notes (Signed)
Call to lab, they can add A1C to labs already drawn, this will be a send out

## 2021-02-27 NOTE — H&P (Signed)
History and Physical    HENRIK ORIHUELA ERX:[id number] DOB: [birth year] DOA: 02/27/2021  PCP: Rogers Blocker, MD Patient coming from: Home  Chief Complaint: Right foot pain  HPI: Michael Blackwell is a 80 y.o. male with medical history significant of ESRD on HD, PAD, hypertension, hyperlipidemia, diet-controlled type 2 diabetes, OSA on CPAP, history of prostate cancer presented to the ED with complaint of right foot pain ongoing for several weeks.  In the ED, patient was borderline febrile with temperature 100.1 F, slightly tachycardic, and hypotensive with systolic in the 95K on arrival.  Labs showing WBC 7.6, hemoglobin 12.4 (stable), platelet count 199K.  Sodium 136, potassium 4.0, chloride 90, bicarb 31, BUN 32, creatinine 6.1, glucose 119.  Initial lactic acid 1.3, repeat pending.  COVID and influenza PCR negative.  Blood culture x2 pending.  Chest x-ray not suggestive of pneumonia. Patient was given 2 L LR boluses and Tylenol.  Vascular surgery consulted.  Patient reports pain in his foot for the past 1 month which is getting worse now.  It is difficult for him to walk.  No injuries reported.  No other complaints.  Denies fevers, dizziness, cough, shortness of breath, chest pain, vomiting, abdominal pain, or diarrhea.  He goes for dialysis every Monday, Wednesday, and Friday and his last dialysis was this morning.  Review of Systems:  All systems reviewed and apart from history of presenting illness, are negative.  Past Medical History:  Diagnosis Date  . Acute respiratory failure with hypoxia ([place]) 12/05/2016   [name] 12/05/2016  . Anemia   . Anxiety   . Arthritis    "joints ache at night sometimes" (3/8/20180  . Chronic kidney disease (CKD), stage II (mild)    Acute on chronic kidney disease stage II-III/notes 12/06/2016  . Dyspnea    with exertion  . ESRD (end stage renal disease) ([place])    [facility] [place]  . Heart murmur    never has caused any problems per patient  . History of  blood transfusion   . History of radiation therapy 02/25/14- 04/23/14   prostate 7800 cGy 40 sessions, seminal vesicles 5600 cGy 40 sessions  . Hypercholesteremia   . Hypertension   . OSA on CPAP   . Pneumonia 12/06/2016  . Prostate cancer ([place]) 11/19/13   gleason 4+3=7, volume 34.74 cc  . Spermatocele    right  . Type II diabetes mellitus (HCC)    diet controlled  . Wears partial dentures    upper    Past Surgical History:  Procedure Laterality Date  . ABDOMINAL AORTOGRAM W/LOWER EXTREMITY N/A 07/11/2020   Procedure: ABDOMINAL AORTOGRAM W/LOWER EXTREMITY;  Surgeon: [name], MD;  Location: [facility];  Service: Cardiovascular;  Laterality: N/A;  . AV FISTULA PLACEMENT Right 03/04/2019   Procedure: RIGHT ARM ARTERIOVENOUS  FISTULA CREATION;  Surgeon: [name], MD;  Location: [facility];  Service: Vascular;  Laterality: Right;  . AV FISTULA PLACEMENT Right 12/30/2019   Procedure: INSERTION OF right  arm ARTERIOVENOUS (AV) GORE-TEX GRAFT;  Surgeon: [name], MD;  Location: [facility];  Service: Vascular;  Laterality: Right;  . BASCILIC VEIN TRANSPOSITION Right 05/26/2019   Procedure: Right Arm BASILIC VEIN TRANSPOSITION SECOND STAGE;  Surgeon: [name], MD;  Location: [facility];  Service: Vascular;  Laterality: Right;  . COLONOSCOPY    . LOWER EXTREMITY ANGIOGRAPHY Bilateral 02/25/2018   Procedure: Lower Extremity Angiography;  Surgeon: [name], MD;  Location: [facility];  Service: Cardiovascular;  Laterality: Bilateral;  limited  . PERIPHERAL VASCULAR ATHERECTOMY Right 07/11/2020   Procedure: PERIPHERAL VASCULAR ATHERECTOMY;  Surgeon: [name], MD;  Location: [facility];  Service: Cardiovascular;  Laterality: Right;  common femoral; sfa  . PERIPHERAL VASCULAR BALLOON ANGIOPLASTY Right 07/11/2020   Procedure: PERIPHERAL VASCULAR BALLOON ANGIOPLASTY;  Surgeon: [name], MD;   Location: [facility];  Service: Cardiovascular;  Laterality: Right;  [facility]  . PERIPHERAL VASCULAR INTERVENTION Right 07/11/2020   Procedure: PERIPHERAL VASCULAR INTERVENTION;  Surgeon: [name], MD;  Location: [facility];  Service: Cardiovascular;  Laterality: Right;  common femoral and superficial femoral stent  . PROSTATE BIOPSY  11/19/13   Gleason 4+3=7, vol 34.74 cc  . RENAL ANGIOGRAPHY N/A 02/25/2018   Procedure: RENAL ANGIOGRAPHY;  Surgeon: [name], MD;  Location: [facility];  Service: Cardiovascular;  Laterality: N/A;  . TONSILLECTOMY     as child     reports that he quit smoking about 19 months ago. His smoking use included cigarettes. He quit after 48.00 years of use. He has never used smokeless tobacco. He reports previous alcohol use. He reports that he does not use drugs.  No Known Allergies  Family History  Problem Relation Age of Onset  . Cancer Brother        brain  . Cancer Mother        stomach  . Cancer Father        ? prostate    Prior to Admission medications   Medication Sig Start Date End Date Taking? Authorizing Provider  amLODipine (NORVASC) 10 MG tablet Take 5 mg by mouth at bedtime.    Yes [provider]  aspirin EC 81 MG tablet Take 81 mg by mouth at bedtime.   Yes [provider]  B Complex-C (B-COMPLEX WITH VITAMIN C) tablet Take 1 tablet by mouth daily.   Yes [provider]  Calcium Carb-Cholecalciferol 600-800 MG-UNIT TABS Take 1 tablet by mouth daily.    Yes [provider]  calcium carbonate (TUMS - DOSED IN MG ELEMENTAL CALCIUM) 500 MG chewable tablet Chew 1 tablet by mouth 3 (three) times daily after meals.   Yes [provider]  cloNIDine (CATAPRES) 0.3 MG tablet Take 0.6 mg by mouth 2 (two) times daily.  12/12/15  Yes [provider]  clopidogrel (PLAVIX) 75 MG tablet Take 1 tablet (75 mg total) by mouth daily. 08/05/20 08/05/21 Yes Dagoberto Ligas, PA-C  gabapentin (NEURONTIN) 100 MG capsule Take 1 capsule (100 mg total) by mouth 3 (three) times daily. 02/17/21  Yes Regal, Tamala Fothergill, DPM  hydrALAZINE (APRESOLINE) 100 MG tablet Take 100 mg by mouth 3 (three) times daily.    Yes [provider]  isosorbide mononitrate (IMDUR) 30 MG 24 hr tablet Take 30 mg by mouth every evening.    Yes [provider]  lidocaine-prilocaine (EMLA) cream Apply 1 application topically daily as needed (prior to port being accessed).  02/22/20  Yes [provider]  Omega-3 Fatty Acids (OMEGA 3 PO) Take 1,040 mg by mouth daily.    Yes [provider]  polyethylene glycol (MIRALAX / GLYCOLAX) 17 g packet Take 17 g by mouth daily as needed (constipation.).   Yes [provider]  rosuvastatin (CRESTOR) 10 MG tablet Take 1 tablet (10 mg total) by mouth daily. 06/21/20  Yes Adrian Prows, MD  tamsulosin (FLOMAX) 0.4 MG CAPS capsule  Take 1 capsule (0.4 mg total) by mouth every other day. Patient taking differently: Take 0.4 mg by mouth at bedtime. 11/16/19  Yes Aline August, MD  vitamin E 180 MG (400 UNITS) capsule Take 400 Units by mouth daily.    Yes [provider]    Physical Exam: Vitals:   02/27/21 1930 02/27/21 1945 02/27/21 2015 02/27/21 2045  BP: (!) 109/50 (!) 107/49 (!) 114/55 (!) 118/55  Pulse:  74    Resp: 18 14 17 18   Temp:      TempSrc:      SpO2: 99% 100% 99%   Weight:      Height:        Physical Exam Constitutional:      General: He is not in acute distress. HENT:     Head: Normocephalic and atraumatic.  Eyes:     Extraocular Movements: Extraocular movements intact.     Conjunctiva/sclera: Conjunctivae normal.  Cardiovascular:     Rate and Rhythm: Normal rate and regular rhythm.     Comments: Right foot: Unable to palpate dorsalis pedis pulse.  Toes slightly cool to touch. Pulmonary:     Effort: Pulmonary effort is normal. No respiratory distress.     Breath sounds: Normal  breath sounds. No wheezing or rales.  Abdominal:     General: Bowel sounds are normal. There is no distension.     Palpations: Abdomen is soft.     Tenderness: There is no abdominal tenderness.  Musculoskeletal:        General: No swelling or tenderness.     Cervical back: Normal range of motion and neck supple.  Skin:    General: Skin is warm and dry.  Neurological:     General: No focal deficit present.     Mental Status: He is alert and oriented to person, place, and time.         Labs on Admission: I have personally reviewed following labs and imaging studies  CBC: Recent Labs  Lab 02/27/21 1811  WBC 7.6  NEUTROABS 5.7  HGB 12.4*  HCT 38.8*  MCV 91.5  PLT 706   Basic Metabolic Panel: Recent Labs  Lab 02/27/21 1811  NA 136  K 4.0  CL 90*  CO2 31  GLUCOSE 119*  BUN 32*  CREATININE 6.10*  CALCIUM 9.0   GFR: Estimated Creatinine Clearance: 9.8 mL/min (A) (by C-G formula based on SCr of 6.1 mg/dL (H)). Liver Function Tests: Recent Labs  Lab 02/27/21 1811  AST 17  ALT 13  ALKPHOS 68  BILITOT 0.5  PROT 7.8  ALBUMIN 3.8   No results for input(s): LIPASE, AMYLASE in the last 168 hours. No results for input(s): AMMONIA in the last 168 hours. Coagulation Profile: Recent Labs  Lab 02/27/21 1811  INR 1.1   Cardiac Enzymes: No results for input(s): CKTOTAL, CKMB, CKMBINDEX, TROPONINI in the last 168 hours. BNP (last 3 results) No results for input(s): PROBNP in the last 8760 hours. HbA1C: No results for input(s): HGBA1C in the last 72 hours. CBG: No results for input(s): GLUCAP in the last 168 hours. Lipid Profile: No results for input(s): CHOL, HDL, LDLCALC, TRIG, CHOLHDL, LDLDIRECT in the last 72 hours. Thyroid Function Tests: No results for input(s): TSH, T4TOTAL, FREET4, T3FREE, THYROIDAB in the last 72 hours. Anemia Panel: No results for input(s): VITAMINB12, FOLATE, FERRITIN, TIBC, IRON, RETICCTPCT in the last 72 hours. Urine analysis: No  results found for: COLORURINE, APPEARANCEUR, [place], [place], [place], [place], [place], [place], [place],  UROBILINOGEN, NITRITE, LEUKOCYTESUR  Radiological Exams on Admission: DG Chest Port 1 View  Result Date: 02/27/2021 CLINICAL DATA:  Possible sepsis EXAM: PORTABLE CHEST 1 VIEW COMPARISON:  11/16/2019 FINDINGS: Cardiac shadow is stable. Lungs are clear. No bony abnormality is noted. IMPRESSION: No acute abnormality noted. Electronically Signed   By: Inez Catalina M.D.   On: 02/27/2021 19:20    EKG: Independently reviewed.  Sinus rhythm, occasional PACs, no significant change since prior tracing.  Assessment/Plan Principal Problem:   Lower limb ischemia Active Problems:   Type 2 diabetes mellitus with renal manifestations (HCC)   Essential hypertension, malignant   OSA on CPAP   Dependence on renal dialysis [facility])   Peripheral arterial disease/ right lower extremity ischemia He had critical limb ischemia in the past and previously had a right common femoral and SFA arthrectomy with stenting of the right common femoral and proximal SFA and balloon angioplasty of the right profunda by Dr. Donzetta Matters in October 2021. Dr. Carlis Abbott from vascular surgery saw the patient in the ED today and feels that this is worsening chronic disease versus acute on chronic event -plan for aortogram with right lower extremity arteriogram and possible intervention tomorrow with Dr. Trula Slade.  Recommending keeping n.p.o. after midnight.  ?Sepsis Patient was borderline febrile, slightly tachycardic, and hypotensive with systolic in the 69S on arrival to the ED.  However, labs showing no leukocytosis or lactic acidosis.  Chest x-ray not suggestive of pneumonia.  COVID and influenza PCR negative.  Hypotension possibly due to intravascular volume depletion in the setting of dialysis.  Tachycardia and hypotension now resolved after 2 L fluid boluses. -Stat procalcitonin level, if elevated start antibiotics.  Blood culture  x2 pending.  ESRD on HD MWF Last dialysis was today.  No signs of volume overload or significant electrolyte derangements.  No indication for urgent hemodialysis overnight. -Consult nephrology in the morning  Hypertension -Hold antihypertensives at this time.  Hyperlipidemia -Continue statin  Diet controlled type 2 diabetes -Check A1c.  Sliding scale insulin very sensitive ACHS.  Carb modified diet.  OSA -Continue nightly CPAP  DVT prophylaxis: Subcutaneous heparin Code Status: Patient wishes to be full code. Family Communication: No family available at this time. Disposition Plan: Status is: Inpatient  Remains inpatient appropriate because:Ongoing diagnostic testing needed not appropriate for outpatient work up and Inpatient level of care appropriate due to severity of illness   Dispo: The patient is from: Home              Anticipated d/c is to: Home              Patient currently is not medically stable to d/c.   Difficult to place patient No  Level of care: Level of care: Telemetry Medical   The medical decision making on this patient was of high complexity and the patient is at high risk for clinical deterioration, therefore this is a level 3 visit.  Shela Leff MD [facility]  If 7PM-7AM, please contact night-coverage [email or web address]  02/27/2021, 9:11 PM

## 2021-02-27 NOTE — ED Triage Notes (Signed)
Pt c/o R foot pain x31mo. Hx DM, compliant w medications. Foot red, swollen, warm to touch. Low grade fever in triage, BP soft. PA Cortni at bedside to evaluate pt

## 2021-02-27 NOTE — ED Provider Notes (Signed)
[facility]ERGENCY DEPARTMENT Provider Note   CSN: [id number] Arrival date & time: 02/27/21  1751     History Chief Complaint  Patient presents with  . Foot Pain    Michael Blackwell is a 80 y.o. male.  HPI Patient complains of pain in right foot, for 2 weeks, making walking difficult.  He has been continuing to eat, take medicines and go to dialysis.  He was last dialyzed earlier today.  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  He denies fever, chills, nausea, vomiting, focal weakness or paresthesia.  He saw his foot doctor, 3 days ago and was started on gabapentin for the right foot discomfort.  This has not helped his discomfort.  There are no other known modifying factors.    Past Medical History:  Diagnosis Date  . Acute respiratory failure with hypoxia ([place]) 12/05/2016   [name] 12/05/2016  . Anemia   . Anxiety   . Arthritis    "joints ache at night sometimes" (3/8/20180  . Chronic kidney disease (CKD), stage II (mild)    Acute on chronic kidney disease stage II-III/notes 12/06/2016  . Dyspnea    with exertion  . ESRD (end stage renal disease) ([place])    [facility] [place]  . Heart murmur    never has caused any problems per patient  . History of blood transfusion   . History of radiation therapy 02/25/14- 04/23/14   prostate 7800 cGy 40 sessions, seminal vesicles 5600 cGy 40 sessions  . Hypercholesteremia   . Hypertension   . OSA on CPAP   . Pneumonia 12/06/2016  . Prostate cancer ([place]) 11/19/13   gleason 4+3=7, volume 34.74 cc  . Spermatocele    right  . Type II diabetes mellitus (HCC)    diet controlled  . Wears partial dentures    upper    Patient Active Problem List   Diagnosis Date Noted  . Lower limb ischemia 02/27/2021  . Other abnormal findings in urine 07/27/2020  . Acute bilateral low back pain without sciatica 07/15/2020  . Muscle strain 07/15/2020  . Unspecified jaundice 06/24/2020  .  Other long term (current) drug therapy 06/24/2020  . Other disorders of phosphorus metabolism 06/24/2020  . Other disorders of electrolyte and fluid balance, not elsewhere classified 06/24/2020  . Generalized edema 06/24/2020  . Encounter for adequacy testing for peritoneal dialysis ([place]) 06/24/2020  . Dependence on renal dialysis ([place]) 06/24/2020  . Anaphylactic shock, unspecified, initial encounter 04/20/2020  . Allergy, unspecified, initial encounter 04/20/2020  . COVID-19 01/29/2020  . Anaphylactic reaction due to adverse effect of correct drug or medicament properly administered, initial encounter 12/26/2019  . Mild protein-calorie malnutrition ([place]) 11/27/2019  . Vitamin D deficiency, unspecified 11/18/2019  . Tobacco use 11/18/2019  . 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 Pruritus, unspecified 11/18/2019  . Personal history of prostate cancer 11/18/2019  . Pain, unspecified 11/18/2019  . Hypothyroidism, unspecified 11/18/2019  . Atherosclerotic heart disease of native coronary artery without angina pectoris 11/18/2019  . Dyspnea, unspecified 11/18/2019  . Diarrhea, unspecified 11/18/2019  . Coagulation defect, unspecified ([place]) 11/18/2019  . Hypertensive chronic kidney disease with stage 1 through stage 4 chronic kidney disease, or unspecified chronic kidney disease 11/18/2019  . Atherosclerosis of native arteries of extremities with intermittent claudication, unspecified extremity ([place]) 11/18/2019  . Acute exacerbation of CHF (congestive heart failure) ([place]) 11/15/2019  . (HFpEF) heart failure with preserved ejection fraction ([place])  11/15/2019  . Hypertensive emergency 11/15/2019  . Demand ischemia ([place]) 11/15/2019  . Smoking greater than 40 pack years 07/29/2019  . PAD (peripheral artery disease) ([place]) 07/29/2019  . OSA on CPAP 07/29/2019  . Symptomatic anemia 04/06/2019  . 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 Renal artery stenosis ([place]) 02/24/2018  . Acute respiratory failure with hypoxia ([place]) 12/10/2016  . Essential hypertension, malignant 12/10/2016  . Hyperlipidemia LDL goal <100 12/10/2016  . Iron deficiency anemia 12/10/2016  . Type 2 diabetes mellitus with renal manifestations ([place]) 12/05/2016  . Malignant neoplasm of prostate ([place]) 01/12/2014    Past Surgical History:  Procedure Laterality Date  . ABDOMINAL AORTOGRAM W/LOWER EXTREMITY N/A 07/11/2020   Procedure: ABDOMINAL AORTOGRAM W/LOWER EXTREMITY;  Surgeon: [name], MD;  Location: [facility];  Service: Cardiovascular;  Laterality: N/A;  . AV FISTULA PLACEMENT Right 03/04/2019   Procedure: RIGHT ARM ARTERIOVENOUS  FISTULA CREATION;  Surgeon: [name], MD;  Location: [facility];  Service: Vascular;  Laterality: Right;  . AV FISTULA PLACEMENT Right 12/30/2019   Procedure: INSERTION OF right  arm ARTERIOVENOUS (AV) GORE-TEX GRAFT;  Surgeon: [name], MD;  Location: [facility];  Service: Vascular;  Laterality: Right;  . BASCILIC VEIN TRANSPOSITION Right 05/26/2019   Procedure: Right Arm BASILIC VEIN TRANSPOSITION SECOND STAGE;  Surgeon: [name], MD;  Location: [facility];  Service: Vascular;  Laterality: Right;  . COLONOSCOPY    . LOWER EXTREMITY ANGIOGRAPHY Bilateral 02/25/2018   Procedure: Lower Extremity Angiography;  Surgeon: [name], MD;  Location: [facility];  Service: Cardiovascular;  Laterality: Bilateral;  limited  . PERIPHERAL VASCULAR ATHERECTOMY Right 07/11/2020   Procedure: PERIPHERAL VASCULAR ATHERECTOMY;  Surgeon: [name], MD;  Location: [facility];  Service: Cardiovascular;  Laterality: Right;  common femoral; sfa  . PERIPHERAL VASCULAR BALLOON ANGIOPLASTY Right 07/11/2020   Procedure: PERIPHERAL VASCULAR BALLOON ANGIOPLASTY;  Surgeon: [name], MD;  Location: [facility];  Service: Cardiovascular;  Laterality: Right;  [facility]  . PERIPHERAL VASCULAR INTERVENTION Right 07/11/2020   Procedure: PERIPHERAL VASCULAR INTERVENTION;  Surgeon: [name], MD;  Location: [facility];  Service: Cardiovascular;  Laterality: Right;  common femoral and superficial femoral stent  . PROSTATE BIOPSY  11/19/13   Gleason 4+3=7, vol 34.74 cc  . RENAL ANGIOGRAPHY N/A 02/25/2018   Procedure: RENAL ANGIOGRAPHY;  Surgeon: [name], MD;  Location: [facility];  Service: Cardiovascular;  Laterality: N/A;  . TONSILLECTOMY     as child       Family History  Problem Relation Age of Onset  . Cancer Brother        brain  . Cancer Mother        stomach  . Cancer Father        ? prostate    Social History   Tobacco Use  . Smoking status: Former Smoker    Years: 48.00    Types: Cigarettes    Quit date: 07/02/2019    Years since quitting: 1.6  . Smokeless tobacco: Never Used  . Tobacco comment: "I smoke 1 cig a month"- per patient on 05/25/19  Vaping Use  . Vaping Use: Never used  Substance Use Topics  . Alcohol use: Not Currently  . Drug use: No    Home Medications Prior to Admission medications  Medication Sig Start Date End Date Taking? Authorizing Provider  amLODipine (NORVASC) 10 MG tablet Take 5 mg by mouth at bedtime.    Yes [provider]  aspirin EC 81 MG tablet Take 81 mg by mouth at bedtime.   Yes [provider]  B Complex-C (B-COMPLEX WITH VITAMIN C) tablet Take 1 tablet by mouth daily.   Yes [provider]  Calcium Carb-Cholecalciferol 600-800 MG-UNIT TABS Take 1 tablet by mouth daily.    Yes [provider]  calcium carbonate (TUMS - DOSED IN MG ELEMENTAL CALCIUM) 500 MG chewable tablet Chew 1 tablet by mouth 3 (three) times daily after meals.   Yes [provider]  cloNIDine (CATAPRES) 0.3 MG tablet Take 0.6 mg by mouth 2 (two) times daily.  12/12/15  Yes  [provider]  clopidogrel (PLAVIX) 75 MG tablet Take 1 tablet (75 mg total) by mouth daily. 08/05/20 08/05/21 Yes Dagoberto Ligas, PA-C  gabapentin (NEURONTIN) 100 MG capsule Take 1 capsule (100 mg total) by mouth 3 (three) times daily. 02/17/21  Yes Regal, Tamala Fothergill, DPM  hydrALAZINE (APRESOLINE) 100 MG tablet Take 100 mg by mouth 3 (three) times daily.    Yes [provider]  isosorbide mononitrate (IMDUR) 30 MG 24 hr tablet Take 30 mg by mouth every evening.    Yes [provider]  lidocaine-prilocaine (EMLA) cream Apply 1 application topically daily as needed (prior to port being accessed).  02/22/20  Yes [provider]  Omega-3 Fatty Acids (OMEGA 3 PO) Take 1,040 mg by mouth daily.    Yes [provider]  polyethylene glycol (MIRALAX / GLYCOLAX) 17 g packet Take 17 g by mouth daily as needed (constipation.).   Yes [provider]  rosuvastatin (CRESTOR) 10 MG tablet Take 1 tablet (10 mg total) by mouth daily. 06/21/20  Yes Adrian Prows, MD  tamsulosin (FLOMAX) 0.4 MG CAPS capsule Take 1 capsule (0.4 mg total) by mouth every other day. Patient taking differently: Take 0.4 mg by mouth at bedtime. 11/16/19  Yes Aline August, MD  vitamin E 180 MG (400 UNITS) capsule Take 400 Units by mouth daily.    Yes [provider]    Allergies    Patient has no known allergies.  Review of Systems   Review of Systems  All other systems reviewed and are negative.   Physical Exam Updated Vital Signs BP (!) 114/55   Pulse 74   Temp 98.6 F (37 C)   Resp 17   Ht 5\' 8"  (1.727 m)   Wt 76.2 kg   SpO2 99%   BMI 25.54 kg/m   Physical Exam Vitals and nursing note reviewed.  Constitutional:      General: He is in acute distress (Uncomfortable).     Appearance: He is well-developed. He is not ill-appearing, toxic-appearing or diaphoretic.  HENT:     Head: Normocephalic and atraumatic.     Right Ear: External ear normal.     Left Ear:  External ear normal.  Eyes:     Conjunctiva/sclera: Conjunctivae normal.     Pupils: Pupils are equal, round, and reactive to light.  Neck:     Trachea: Phonation normal.  Cardiovascular:     Rate and Rhythm: Normal rate and regular rhythm.     Heart sounds: Normal heart sounds.     Comments: Vascular graft, right upper arm, for dialysis, normal thrill present Pulmonary:     Effort: Pulmonary effort is normal.  Breath sounds: Normal breath sounds.  Abdominal:     Palpations: Abdomen is soft.     Tenderness: There is no abdominal tenderness.  Musculoskeletal:        General: Normal range of motion.     Cervical back: Normal range of motion and neck supple.     Comments: Right foot cold as compared to left with an appearance of ischemia of the great toe.  Unable to palpate pulses of right foot or ankle  Skin:    General: Skin is warm and dry.  Neurological:     Mental Status: He is alert and oriented to person, place, and time.     Cranial Nerves: No cranial nerve deficit.     Sensory: No sensory deficit.     Motor: No abnormal muscle tone.     Coordination: Coordination normal.  Psychiatric:        Mood and Affect: Mood normal.        Behavior: Behavior normal.        Thought Content: Thought content normal.        Judgment: Judgment normal.     ED Results / Procedures / Treatments   Labs (all labs ordered are listed, but only abnormal results are displayed) Labs Reviewed  COMPREHENSIVE METABOLIC PANEL - Abnormal; Notable for the following components:      Result Value   Chloride 90 (*)    Glucose, Bld 119 (*)    BUN 32 (*)    Creatinine, Ser 6.10 (*)    GFR, Estimated 9 (*)    All other components within normal limits  CBC WITH DIFFERENTIAL/PLATELET - Abnormal; Notable for the following components:   Hemoglobin 12.4 (*)    HCT 38.8 (*)    RDW 16.6 (*)    All other components within normal limits  RESP PANEL BY RT-PCR (FLU A&B, COVID) ARPGX2  CULTURE, BLOOD  (ROUTINE X 2)  CULTURE, BLOOD (ROUTINE X 2)  URINE CULTURE  LACTIC ACID, PLASMA  PROTIME-INR  APTT  LACTIC ACID, PLASMA  URINALYSIS, ROUTINE W REFLEX MICROSCOPIC    EKG EKG Interpretation  Date/Time:  Monday Feb 27 2021 18:27:46 EDT Ventricular Rate:  90 PR Interval:  169 QRS Duration: 94 QT Interval:  376 QTC Calculation: 461 R Axis:   38 Text Interpretation: Sinus rhythm Atrial premature complexes RSR' in V1 or V2, right VCD or RVH Minimal ST elevation, anterior leads since last tracing no significant change Confirmed by Daleen Bo [phone]) on 02/27/2021 6:31:07 PM   Radiology DG Chest Port 1 View  Result Date: 02/27/2021 CLINICAL DATA:  Possible sepsis EXAM: PORTABLE CHEST 1 VIEW COMPARISON:  11/16/2019 FINDINGS: Cardiac shadow is stable. Lungs are clear. No bony abnormality is noted. IMPRESSION: No acute abnormality noted. Electronically Signed   By: Inez Catalina M.D.   On: 02/27/2021 19:20    Procedures .Critical Care Performed by: Daleen Bo, MD Authorized by: Daleen Bo, MD   Critical care provider statement:    Critical care time (minutes):  35   Critical care start time:  02/27/2021 6:25 PM   Critical care end time:  02/27/2021 8:29 PM   Critical care time was exclusive of:  Separately billable procedures and treating other patients   Critical care was necessary to treat or prevent imminent or life-threatening deterioration of the following conditions:  Circulatory failure   Critical care was time spent personally by me on the following activities:  Blood draw for specimens, development of treatment plan with  patient or surrogate, discussions with consultants, evaluation of patient's response to treatment, examination of patient, obtaining history from patient or surrogate, ordering and performing treatments and interventions, ordering and review of laboratory studies, pulse oximetry, re-evaluation of patient's condition, review of old charts and ordering and  review of radiographic studies     Medications Ordered in ED Medications  lactated ringers bolus 1,000 mL (0 mLs Intravenous Stopped 02/27/21 1921)    And  lactated ringers bolus 1,000 mL (0 mLs Intravenous Stopped 02/27/21 1939)    And  lactated ringers bolus 500 mL (500 mLs Intravenous Not Given 02/27/21 1949)  acetaminophen (TYLENOL) tablet 650 mg (650 mg Oral Given 02/27/21 1842)    ED Course  I have reviewed the triage vital signs and the nursing notes.  Pertinent labs & imaging results that were available during my care of the patient were reviewed by me and considered in my medical decision making (see chart for details).  Clinical Course as of 02/27/21 2029  Mon Feb 27, 2021  1844 Requested that nursing find Doppler so I can assess his pulses. [EW]  1940 I consulted vascular surgery, Dr. Carlis Abbott who came to see the patient.  He agrees with my examination.  Patient does not need antibiotics at this time.  I suspect that his lactate will return very high because of the ongoing ischemia of his right foot.  Dr. Carlis Abbott plans on doing arteriogram, tomorrow, to assess the patient's right leg arterial anatomy.  He request that the patient be admitted to the medical service. [EW]    Clinical Course User Index [EW] Daleen Bo, MD   MDM Rules/Calculators/A&P                           Patient Vitals for the past 24 hrs:  BP Temp Temp src Pulse Resp SpO2 Height Weight  02/27/21 2015 (!) 114/55 -- -- -- 17 99 % -- --  02/27/21 1945 (!) 107/49 -- -- 74 14 100 % -- --  02/27/21 1930 (!) 109/50 -- -- -- 18 99 % -- --  02/27/21 1915 (!) 99/48 -- -- -- 16 -- -- --  02/27/21 1823 (!) 136/91 98.6 F (37 C) -- 94 10 98 % -- --  02/27/21 1811 -- -- -- -- -- -- 5\' 8"  (1.727 m) 76.2 kg  02/27/21 1807 (!) 83/47 100.1 F (37.8 C) Oral (!) 105 18 96 % -- --    7:42 PM Reevaluation with update and discussion. After initial assessment and treatment, an updated evaluation reveals no change in  clinical status, plan discussed with patient, all questions answered. Daleen Bo   Medical Decision Making:  This patient is presenting for evaluation of right foot pain, which does require a range of treatment options, and is a complaint that involves a high risk of morbidity and mortality. The differential diagnoses include foot ischemia, foot infection, complications of dialysis and diabetes. I decided to review old records, and in summary elderly male presenting with foot pain, not improving with gabapentin, he has a history of right leg arterial bypass.  I did not require additional historical information from anyone.  Clinical Laboratory Tests Ordered, included CBC, Metabolic panel and Sepsis bundle. Review indicates initial findings consistent with stable chronic illness.  Chloride low, glucose high, BUN high, creatinine high, hemoglobin low. Radiologic Tests Ordered, included chest x-ray.  I independently Visualized: Radiograph images, which show no infiltrate or edema  .  Critical  Interventions-clinical evaluation, laboratory testing, radiography, consultation with vascular surgery, observation reassessment  After These Interventions, the Patient was reevaluated and was found with ischemic right foot and lower leg, requiring evaluation intervention by vascular surgery.  Patient initially with hypotension, noted this was on day of dialysis.  Low-grade temperature, not technically a fever.  White count normal.  Blood pressure improved with IV fluids.  Doubt sepsis.  Patient requires hospitalization for further intervention and treatment.  Plan for arteriogram tomorrow by vascular surgery.  CRITICAL CARE-yes Performed by: Daleen Bo  Nursing Notes Reviewed/ Care Coordinated Applicable Imaging Reviewed Interpretation of Laboratory Data incorporated into ED treatment   8:00 PM-Consult complete with hospitalist. Patient case explained and discussed.  She agrees to admit patient for  further evaluation and treatment. Call ended at 8:10 PM    Final Clinical Impression(s) / ED Diagnoses Final diagnoses:  Ischemic pain of right foot    Rx / DC Orders ED Discharge Orders    None       Daleen Bo, MD 02/27/21 2029

## 2021-02-27 NOTE — H&P (View-Only) (Signed)
Hospital Consult    Reason for Consult: Right foot rest pain Referring Physician: ED, Dr. Eulis Foster MRN #:  [id number]  History of Present Illness: This is a 80 y.o. male with multiple medical comorbidities including end-stage renal disease, hypertension, hyperlipidemia, diabetes, peripheral arterial disease that vascular surgery has been consulted for rest pain in the right foot.  Patient presented to the ED with pain in the right foot that has been ongoing for the last several weeks.  Most of his pain is in the right great toe.  He also has some discoloration of the toe itself.    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 Donzetta Matters.  He had jetstream atherectomy of the right common femoral and SFA and then stenting of the right common femoral and proximal SFA with balloon angioplasty of the right profunda.  States his right foot has never been the same however this has gotten much worse over the last several weeks.  Past Medical History:  Diagnosis Date  . Acute respiratory failure with hypoxia ([place]) 12/05/2016   [name] 12/05/2016  . Anemia   . Anxiety   . Arthritis    "joints ache at night sometimes" (3/8/20180  . Chronic kidney disease (CKD), stage II (mild)    Acute on chronic kidney disease stage II-III/notes 12/06/2016  . Dyspnea    with exertion  . ESRD (end stage renal disease) ([place])    [facility] [place]  . Heart murmur    never has caused any problems per patient  . History of blood transfusion   . History of radiation therapy 02/25/14- 04/23/14   prostate 7800 cGy 40 sessions, seminal vesicles 5600 cGy 40 sessions  . Hypercholesteremia   . Hypertension   . OSA on CPAP   . Pneumonia 12/06/2016  . Prostate cancer ([place]) 11/19/13   gleason 4+3=7, volume 34.74 cc  . Spermatocele    right  . Type II diabetes mellitus (HCC)    diet controlled  . Wears partial dentures    upper     Past Surgical History:  Procedure Laterality Date  . ABDOMINAL AORTOGRAM W/LOWER EXTREMITY N/A 07/11/2020   Procedure: ABDOMINAL AORTOGRAM W/LOWER EXTREMITY;  Surgeon: [name], MD;  Location: [facility];  Service: Cardiovascular;  Laterality: N/A;  . AV FISTULA PLACEMENT Right 03/04/2019   Procedure: RIGHT ARM ARTERIOVENOUS  FISTULA CREATION;  Surgeon: [name], MD;  Location: [facility];  Service: Vascular;  Laterality: Right;  . AV FISTULA PLACEMENT Right 12/30/2019   Procedure: INSERTION OF right  arm ARTERIOVENOUS (AV) GORE-TEX GRAFT;  Surgeon: [name], MD;  Location: [facility];  Service: Vascular;  Laterality: Right;  . BASCILIC VEIN TRANSPOSITION Right 05/26/2019   Procedure: Right Arm BASILIC VEIN TRANSPOSITION SECOND STAGE;  Surgeon: [name], MD;  Location: [facility];  Service: Vascular;  Laterality: Right;  . COLONOSCOPY    . LOWER EXTREMITY ANGIOGRAPHY Bilateral 02/25/2018   Procedure: Lower Extremity Angiography;  Surgeon: [name], MD;  Location: [facility];  Service: Cardiovascular;  Laterality: Bilateral;  limited  . PERIPHERAL VASCULAR ATHERECTOMY Right 07/11/2020   Procedure: PERIPHERAL VASCULAR ATHERECTOMY;  Surgeon: [name], MD;  Location: [facility];  Service: Cardiovascular;  Laterality: Right;  common femoral; sfa  . PERIPHERAL VASCULAR BALLOON ANGIOPLASTY Right 07/11/2020   Procedure: PERIPHERAL VASCULAR BALLOON ANGIOPLASTY;  Surgeon: [name], MD;  Location: [facility];  Service: Cardiovascular;  Laterality: Right;  [employer]  . PERIPHERAL VASCULAR INTERVENTION Right 07/11/2020   Procedure: PERIPHERAL VASCULAR INTERVENTION;  Surgeon: [name], MD;  Location: [facility];  Service: Cardiovascular;  Laterality: Right;  common femoral and superficial femoral stent  . PROSTATE BIOPSY  11/19/13   Gleason 4+3=7, vol 34.74 cc   . RENAL ANGIOGRAPHY N/A 02/25/2018   Procedure: RENAL ANGIOGRAPHY;  Surgeon: [name], MD;  Location: [place] [facility];  Service: Cardiovascular;  Laterality: N/A;  . TONSILLECTOMY     as child    No Known Allergies  Prior to Admission medications   Medication Sig Start Date End Date Taking? Authorizing Provider  amLODipine (NORVASC) 10 MG tablet Take 5 mg by mouth at bedtime.     [provider]  aspirin EC 81 MG tablet Take 81 mg by mouth at bedtime.    [provider]  B Complex-C (B-COMPLEX WITH VITAMIN C) tablet Take 1 tablet by mouth daily.    [provider]  bismuth subsalicylate (PEPTO BISMOL) 262 MG/15ML suspension Take 30 mLs by mouth every 6 (six) hours as needed for indigestion (upset stomach).     [provider]  Calcium Carb-Cholecalciferol 600-800 MG-UNIT TABS Take 1 tablet by mouth daily.     [provider]  calcium carbonate (TUMS - DOSED IN MG ELEMENTAL CALCIUM) 500 MG chewable tablet Chew 1 tablet by mouth 3 (three) times daily after meals.    [provider]  cloNIDine (CATAPRES) 0.3 MG tablet Take 0.6 mg by mouth 2 (two) times daily.  12/12/15   [provider]  clopidogrel (PLAVIX) 75 MG tablet Take 1 tablet (75 mg total) by mouth daily. 08/05/20 08/05/21  Dagoberto Ligas, PA-C  gabapentin (NEURONTIN) 100 MG capsule Take 1 capsule (100 mg total) by mouth 3 (three) times daily. 02/17/21   Wallene Huh, DPM  hydrALAZINE (APRESOLINE) 100 MG tablet Take 100 mg by mouth 3 (three) times daily.     [provider]  isosorbide mononitrate (IMDUR) 30 MG 24 hr tablet Take 30 mg by mouth every evening.     [provider]  lidocaine-prilocaine (EMLA) cream Apply 1 application topically daily as needed (prior to port being accessed).  02/22/20   [provider]  Omega-3 Fatty Acids (OMEGA 3 PO) Take 1,040 mg by mouth daily.     [provider]  phenylephrine  (NEO-SYNEPHRINE) 1 % nasal spray Place 1 drop into both nostrils at bedtime as needed for congestion.     [provider]  polyethylene glycol (MIRALAX / GLYCOLAX) 17 g packet Take 17 g by mouth daily as needed (constipation.).    [provider]  rosuvastatin (CRESTOR) 10 MG tablet Take 1 tablet (10 mg total) by mouth daily. 06/21/20   Adrian Prows, MD  sitaGLIPtin (JANUVIA) 100 MG tablet Take by mouth. 07/06/20   [provider]  tamsulosin (FLOMAX) 0.4 MG CAPS capsule Take 1 capsule (0.4 mg total) by mouth every other day. Patient taking differently: Take 0.4 mg by mouth at bedtime. 11/16/19   Aline August, MD  vitamin E 180 MG (400 UNITS) capsule Take 400 Units by mouth daily.     [provider]    Social History   Socioeconomic History  . Marital status: Married    Spouse name: Not on file  . Number of children: Not on file  . Years of education: Not on file  .  Highest education level: Not on file  Occupational History  . Not on file  Tobacco Use  . Smoking status: Former Smoker    Years: 48.00    Types: Cigarettes    Quit date: 07/02/2019    Years since quitting: 1.6  . Smokeless tobacco: Never Used  . Tobacco comment: "I smoke 1 cig a month"- per patient on 05/25/19  Vaping Use  . Vaping Use: Never used  Substance and Sexual Activity  . Alcohol use: Not Currently  . Drug use: No  . Sexual activity: Yes  Other Topics Concern  . Not on file  Social History Narrative  . Not on file   Social Determinants of Health   Financial Resource Strain: Not on file  Food Insecurity: Not on file  Transportation Needs: Not on file  Physical Activity: Not on file  Stress: Not on file  Social Connections: Not on file  Intimate Partner Violence: Not on file     Family History  Problem Relation Age of Onset  . Cancer Brother        brain  . Cancer Mother        stomach  . Cancer Father        ? prostate    ROS: [x]  Positive   [ ]  Negative    [ ]  All sytems reviewed and are negative  Cardiovascular: []  chest pain/pressure []  palpitations []  SOB lying flat []  DOE []  pain in legs while walking [x]  pain in legs at rest - right foot [x]  pain in legs at night - right foot []  non-healing ulcers []  hx of DVT []  swelling in legs  Pulmonary: []  productive cough []  asthma/wheezing []  home O2  Neurologic: []  weakness in []  arms []  legs []  numbness in []  arms []  legs []  hx of CVA []  mini stroke [] difficulty speaking or slurred speech []  temporary loss of vision in one eye []  dizziness  Hematologic: []  hx of cancer []  bleeding problems []  problems with blood clotting easily  Endocrine:   []  diabetes []  thyroid disease  GI []  vomiting blood []  blood in stool  GU: []  CKD/renal failure []  HD--[]  M/W/F or []  T/T/S []  burning with urination []  blood in urine  Psychiatric: []  anxiety []  depression  Musculoskeletal: []  arthritis []  joint pain  Integumentary: []  rashes []  ulcers  Constitutional: []  fever []  chills   Physical Examination  Vitals:   02/27/21 1807 02/27/21 1823  BP: (!) 83/47 (!) 136/91  Pulse: (!) 105 94  Resp: 18 10  Temp: 100.1 F (37.8 C) 98.6 F (37 C)  SpO2: 96% 98%   Body mass index is 25.54 kg/m.  General:  NAD Gait: Not observed HENT: WNL, normocephalic Pulmonary: normal non-labored breathing Cardiac: regular, without  Murmurs, rubs or gallops Abdomen: soft, NT/ND Vascular Exam/Pulses: Right femoral pulse palpable Left femoral pulse weakly palpable higher Right PT monophasic Right dorsalis pedis signal sounds venous Ischemic changes to the right great toe Right upper arm AV graft with good thrill Musculoskeletal: no muscle wasting or atrophy  Neurologic: A&O X 3; Appropriate Affect ; SENSATION: normal; MOTOR FUNCTION:  moving all extremities equally. Speech is fluent/normal      CBC    Component Value Date/Time   WBC 8.0 07/15/2020 2019   RBC 3.77 (L)  07/15/2020 2019   HGB 10.4 (L) 07/15/2020 2019   HGB 10.5 (L) 03/01/2016 1120   HCT 33.2 (L) 07/15/2020 2019   HCT 32.5 (L) 03/01/2016 1120  PLT 163 07/15/2020 2019   PLT 254 03/01/2016 1120   MCV 88.1 07/15/2020 2019   MCV 82.5 03/01/2016 1120   MCH 27.6 07/15/2020 2019   MCHC 31.3 07/15/2020 2019   RDW 18.4 (H) 07/15/2020 2019   RDW 16.3 (H) 03/01/2016 1120   LYMPHSABS 1.2 07/15/2020 2019   LYMPHSABS 1.8 03/01/2016 1120   MONOABS 0.8 07/15/2020 2019   MONOABS 0.4 03/01/2016 1120   EOSABS 0.1 07/15/2020 2019   EOSABS 0.1 03/01/2016 1120   BASOSABS 0.0 07/15/2020 2019   BASOSABS 0.0 03/01/2016 1120    BMET    Component Value Date/Time   NA 139 07/15/2020 2019   NA 141 03/01/2016 1120   K 3.9 07/15/2020 2019   K 3.6 03/01/2016 1120   CL 96 (L) 07/15/2020 2019   CO2 24 07/15/2020 2019   CO2 25 03/01/2016 1120   GLUCOSE 139 (H) 07/15/2020 2019   GLUCOSE 151 (H) 03/01/2016 1120   BUN 67 (H) 07/15/2020 2019   BUN 17.1 03/01/2016 1120   CREATININE 13.29 (H) 07/15/2020 2019   CREATININE 1.5 (H) 03/01/2016 1120   CALCIUM 8.9 07/15/2020 2019   CALCIUM 9.2 09/08/2019 0823   CALCIUM 9.2 03/01/2016 1120   GFRNONAA 3 (L) 07/15/2020 2019   GFRAA 5 (L) 11/16/2019 0252    COAGS: Lab Results  Component Value Date   INR 1.1 04/06/2019   INR 1.04 02/25/2018     Non-Invasive Vascular Imaging:    No new imaging to review   ASSESSMENT/PLAN: This is a 80 y.o. male multiple medical problems that presents with worsening CLI with rest pain over the last several weeks in the right foot.  He had critical limb ischemia in the past and previously had a right common femoral and SFA arthrectomy with stenting of the right common femoral and proximal SFA and balloon angioplasty of the right profunda by Dr. Donzetta Matters in 07/2020.  He states his foot has never been the same and its unclear to me if this is worsening chronic disease or acute on chronic event.  He does have a right femoral pulse and  monophasic PT signal at the right ankle.  I have recommended aortogram with right lower extremity arteriogram and possible intervention tomorrow with Dr. Trula Slade.  Please keep the patient n.p.o. after midnight.  May ultimately require open surgical intervention pending findings on the arteriogram.   [name], MD [facility] Office: [place]

## 2021-02-28 ENCOUNTER — Inpatient Hospital Stay (HOSPITAL_COMMUNITY): Payer: Medicare Other

## 2021-02-28 ENCOUNTER — Encounter (HOSPITAL_COMMUNITY): Payer: Self-pay | Admitting: Internal Medicine

## 2021-02-28 ENCOUNTER — Other Ambulatory Visit: Payer: Self-pay

## 2021-02-28 ENCOUNTER — Inpatient Hospital Stay (HOSPITAL_COMMUNITY)
Admission: EM | Disposition: A | Discharge status: Home / Self Care | Payer: Self-pay | Source: Home / Self Care | Attending: Internal Medicine

## 2021-02-28 DIAGNOSIS — I998 Other disorder of circulatory system: Secondary | ICD-10-CM | POA: Diagnosis not present

## 2021-02-28 DIAGNOSIS — Z01810 Encounter for preprocedural cardiovascular examination: Secondary | ICD-10-CM

## 2021-02-28 HISTORY — PX: ABDOMINAL AORTOGRAM W/LOWER EXTREMITY: CATH118223

## 2021-02-28 LAB — GLUCOSE, CAPILLARY
Glucose-Capillary: 101 mg/dL — ABNORMAL HIGH (ref 70–99)
Glucose-Capillary: 87 mg/dL (ref 70–99)
Glucose-Capillary: 101 mg/dL — ABNORMAL HIGH (ref 70–99)
Glucose-Capillary: 183 mg/dL — ABNORMAL HIGH (ref 70–99)
Glucose-Capillary: 121 mg/dL — ABNORMAL HIGH (ref 70–99)

## 2021-02-28 LAB — HEMOGLOBIN A1C
Hgb A1c MFr Bld: 5.7 % — ABNORMAL HIGH (ref 4.8–5.6)
Mean Plasma Glucose: 117 mg/dL

## 2021-02-28 LAB — SURGICAL PCR SCREEN
MRSA, PCR: NEGATIVE
Staphylococcus aureus: NEGATIVE

## 2021-02-28 SURGERY — ABDOMINAL AORTOGRAM W/LOWER EXTREMITY
Anesthesia: LOCAL | Laterality: Bilateral

## 2021-02-28 MED ORDER — LIDOCAINE HCL (PF) 1 % IJ SOLN
INTRAMUSCULAR | Status: DC | PRN
  Administered 2021-02-28: 15 mL via INTRADERMAL

## 2021-02-28 MED ORDER — HEPARIN (PORCINE) IN NACL 1000-0.9 UT/500ML-% IV SOLN
INTRAVENOUS | Status: DC | PRN
  Administered 2021-02-28 (×2): 500 mL

## 2021-02-28 MED ORDER — MORPHINE SULFATE (PF) 2 MG/ML IV SOLN
2.0000 mg | INTRAVENOUS | Status: DC | PRN
  Filled 2021-02-28: qty 1

## 2021-02-28 MED ORDER — IODIXANOL 320 MG/ML IV SOLN
INTRAVENOUS | Status: DC | PRN
Start: 2021-02-28 — End: 2021-02-28
  Administered 2021-02-28: 120 mL via INTRA_ARTERIAL

## 2021-02-28 MED ORDER — VANCOMYCIN HCL 1000 MG IV SOLR: 750.0000 mg | INTRAVENOUS | Status: DC

## 2021-02-28 MED ORDER — FENTANYL CITRATE (PF) 100 MCG/2ML IJ SOLN
INTRAMUSCULAR | Status: AC
  Filled 2021-02-28: qty 2

## 2021-02-28 MED ORDER — SODIUM CHLORIDE 0.9% FLUSH: 3.0000 mL | INTRAVENOUS | Status: DC | PRN

## 2021-02-28 MED ORDER — ONDANSETRON HCL 4 MG/2ML IJ SOLN: 4.0000 mg | Freq: Four times a day (QID) | INTRAMUSCULAR | Status: DC | PRN

## 2021-02-28 MED ORDER — SODIUM CHLORIDE 0.9 % IV SOLN
250.0000 mL | INTRAVENOUS | Status: DC | PRN
  Administered 2021-03-01: 250 mL via INTRAVENOUS

## 2021-02-28 MED ORDER — MIDAZOLAM HCL 2 MG/2ML IJ SOLN
INTRAMUSCULAR | Status: DC | PRN
  Administered 2021-02-28: 2 mg via INTRAVENOUS

## 2021-02-28 MED ORDER — VANCOMYCIN HCL IN DEXTROSE 750-5 MG/150ML-% IV SOLN
750.0000 mg | INTRAVENOUS | Status: DC
  Filled 2021-02-28: qty 150

## 2021-02-28 MED ORDER — LIDOCAINE HCL (PF) 1 % IJ SOLN
INTRAMUSCULAR | Status: AC
  Filled 2021-02-28: qty 30

## 2021-02-28 MED ORDER — SODIUM CHLORIDE 0.9 % IV SOLN
1.0000 g | INTRAVENOUS | Status: DC
  Administered 2021-03-01: 1 g via INTRAVENOUS
  Filled 2021-02-28 (×2): qty 1

## 2021-02-28 MED ORDER — MIDAZOLAM HCL 2 MG/2ML IJ SOLN
INTRAMUSCULAR | Status: AC
  Filled 2021-02-28: qty 2

## 2021-02-28 MED ORDER — MUPIROCIN 2 % EX OINT: 1.0000 "application " | TOPICAL_OINTMENT | Freq: Two times a day (BID) | CUTANEOUS | Status: DC

## 2021-02-28 MED ORDER — SODIUM CHLORIDE 0.9 % IV SOLN
1.0000 g | Freq: Once | INTRAVENOUS | Status: AC
  Administered 2021-02-28: 1 g via INTRAVENOUS
  Filled 2021-02-28: qty 1

## 2021-02-28 MED ORDER — FENTANYL CITRATE (PF) 100 MCG/2ML IJ SOLN
INTRAMUSCULAR | Status: DC | PRN
  Administered 2021-02-28: 50 ug via INTRAVENOUS

## 2021-02-28 MED ORDER — GABAPENTIN 100 MG PO CAPS
100.0000 mg | ORAL_CAPSULE | Freq: Three times a day (TID) | ORAL | Status: DC
  Administered 2021-02-28 – 2021-03-01 (×2): 100 mg via ORAL
  Filled 2021-02-28 (×2): qty 1

## 2021-02-28 MED ORDER — LABETALOL HCL 5 MG/ML IV SOLN: 10.0000 mg | INTRAVENOUS | Status: DC | PRN

## 2021-02-28 MED ORDER — HEPARIN (PORCINE) IN NACL 1000-0.9 UT/500ML-% IV SOLN
INTRAVENOUS | Status: AC
  Filled 2021-02-28: qty 1000

## 2021-02-28 MED ORDER — ACETAMINOPHEN 325 MG PO TABS: 650.0000 mg | ORAL_TABLET | ORAL | Status: DC | PRN

## 2021-02-28 MED ORDER — SODIUM CHLORIDE 0.9% FLUSH
3.0000 mL | Freq: Two times a day (BID) | INTRAVENOUS | Status: DC
  Administered 2021-02-28 – 2021-03-01 (×2): 3 mL via INTRAVENOUS

## 2021-02-28 MED ORDER — HYDRALAZINE HCL 20 MG/ML IJ SOLN
5.0000 mg | INTRAMUSCULAR | Status: DC | PRN
Start: 2021-02-28 — End: 2021-03-01

## 2021-02-28 MED ORDER — VANCOMYCIN HCL 1500 MG/300ML IV SOLN
1500.0000 mg | Freq: Once | INTRAVENOUS | Status: AC
  Administered 2021-02-28: 1500 mg via INTRAVENOUS
  Filled 2021-02-28: qty 300

## 2021-02-28 MED ORDER — LABETALOL HCL 5 MG/ML IV SOLN
INTRAVENOUS | Status: AC
  Filled 2021-02-28: qty 4

## 2021-02-28 SURGICAL SUPPLY — 13 items
CATH OMNI FLUSH 5F 65CM (CATHETERS) ×1 IMPLANT
CATH SOFT-VU 4F 65 STRAIGHT (CATHETERS) IMPLANT
CATH SOFT-VU STRAIGHT 4F 65CM (CATHETERS) ×2
GLIDEWIRE NITREX 0.018X80X5 (WIRE) ×2
GUIDEWIRE NITREX 0.018X80X5 (WIRE) IMPLANT
KIT MICROPUNCTURE NIT STIFF (SHEATH) ×1 IMPLANT
KIT PV (KITS) ×2 IMPLANT
SHEATH PINNACLE 5F 10CM (SHEATH) ×1 IMPLANT
SHEATH PROBE COVER 6X72 (BAG) ×1 IMPLANT
SYR MEDRAD MARK V 150ML (SYRINGE) ×1 IMPLANT
TRANSDUCER W/STOPCOCK (MISCELLANEOUS) ×2 IMPLANT
TRAY PV CATH (CUSTOM PROCEDURE TRAY) ×2 IMPLANT
WIRE BENTSON .035X145CM (WIRE) ×1 IMPLANT

## 2021-02-28 NOTE — Plan of Care (Signed)
  Problem: Clinical Measurements: Goal: Ability to maintain clinical measurements within normal limits will improve Outcome: Progressing Goal: Will remain free from infection Outcome: Progressing   Problem: Activity: Goal: Risk for activity intolerance will decrease Outcome: Progressing   

## 2021-02-28 NOTE — Progress Notes (Signed)
PROGRESS NOTE    Michael Blackwell  TTS:[id number] DOB: 09-Jan-1941 DOA: 02/27/2021 PCP: Rogers Blocker, MD  Outpatient Specialists:   Brief Narrative: Patient is a 80 year old African-American male with past medical history significant for end-stage renal disease on hemodialysis on Monday, Wednesday and Friday; PAD, hypertension, hyperlipidemia, diet-controlled type 2 diabetes, OSA on CPAP, history of prostate cancer.  Patient was admitted with about 5-month history of right foot pain.  Patient underwent aortogram and runoff earlier today.     Aortogram and runoff revealed:          "#1  Occluded right superficial femoral artery with ostial profunda stenosis.  There is reconstitution of a diseased above-knee popliteal artery.  The below-knee popliteal artery is without stenosis.  There is two-vessel runoff             #2  Due to the stent within the common femoral artery and profunda stenosis, surgical revascularization would be preferred.             #3 severely diseased left common femoral artery with occluded superficial femoral artery and single-vessel runoff via the peroneal artery"  No indication for renal replacement therapy today.  Last hemodialysis was yesterday.  Will consult nephrology team.  Assessment & Plan:   Principal Problem:   Lower limb ischemia Active Problems:   Type 2 diabetes mellitus with renal manifestations ([place])   Essential hypertension, malignant   OSA on CPAP   Dependence on renal dialysis ([place])  Peripheral arterial disease/ right lower extremity ischemia: -Patient underwent ureterogram and runoff earlier today (see above).   -For surgical reconstruction.   -Vascular surgery team is directing care. -Continue Crestor. -Continue prophylactic heparin.  ?Sepsis/SIRS: -As per prior documentation "patient was borderline febrile, slightly tachycardic, and hypotensive with systolic in the 09Q on arrival to the ED.  However, labs showing no leukocytosis or lactic  acidosis.  Chest x-ray not suggestive of pneumonia".   -Tachycardia has resolved. -Hypotension has resolved. -Procalcitonin was 0.45, however, patient has end-stage renal disease -No obvious signs of infection. -Follow cultures -Patient is currently on IV vancomycin and cefepime. -Low threshold to discontinue antibiotics.  ESRD on HD MWF -Nephrology team consulted. -Last hemodialysis was yesterday. -Patient has right upper extremity AV graft. -Likely continue hemodialysis Monday, Wednesday and Friday schedule. -ESRD management by the nephrology team.    Hypertension -Optimize.   -Resume Norvasc 10 Mg p.o. once daily, hydralazine 100 Mg p.o. once daily, isosorbide mononitrate 30 Mg p.o. once daily.  Hyperlipidemia -Continue statin  Diet controlled type 2 diabetes -Hemoglobin A1c. -Continue sliding scale insulin coverage. -Carb modified diet.  OSA -Continue nightly CPAP  DVT prophylaxis: Subcutaneous heparin Code Status: Full code Family Communication:  Disposition Plan: This will depend on hospital course   Consultants:   Vascular Surgery  Nephrology  Procedures:   Aortogram and runoff  Antimicrobials:   IV vancomycin.  IV cefepime.    Subjective: -No new complaints. -No chest pain. -No shortness of breath. -Patient has chronic right lower extremity pain  Objective: Vitals:   02/28/21 1145 02/28/21 1225 02/28/21 1251 02/28/21 1633  BP: (!) 164/49 (!) 165/56 (!) 148/56 (!) 166/63  Pulse: 82 79 76 78  Resp: 16 20 13 20   Temp:   98.2 F (36.8 C) 97.8 F (36.6 C)  TempSrc:   Oral Oral  SpO2: 98% 100% 99% 100%  Weight:      Height:        Intake/Output Summary (Last 24 hours) at 02/28/2021  [place] filed at 02/28/2021 0328 Gross per 24 hour  Intake 98.93 ml  Output --  Net 98.93 ml   Filed Weights   02/27/21 1811  Weight: 76.2 kg    Examination:  General exam: Appears calm and comfortable  Respiratory system: Clear to  auscultation. Respiratory effort normal. Cardiovascular system: S1 & S2  Gastrointestinal system: Abdomen is nondistended, soft and nontender. No organomegaly or masses felt. Normal bowel sounds heard. Central nervous system: Alert and oriented.  Patient moves all extremities.   Extremities: No leg edema.  Data Reviewed: I have personally reviewed following labs and imaging studies  CBC: Recent Labs  Lab 02/27/21 1811  WBC 7.6  NEUTROABS 5.7  HGB 12.4*  HCT 38.8*  MCV 91.5  PLT 096   Basic Metabolic Panel: Recent Labs  Lab 02/27/21 1811  NA 136  K 4.0  CL 90*  CO2 31  GLUCOSE 119*  BUN 32*  CREATININE 6.10*  CALCIUM 9.0   GFR: Estimated Creatinine Clearance: 9.8 mL/min (A) (by C-G formula based on SCr of 6.1 mg/dL (H)). Liver Function Tests: Recent Labs  Lab 02/27/21 1811  AST 17  ALT 13  ALKPHOS 68  BILITOT 0.5  PROT 7.8  ALBUMIN 3.8   No results for input(s): LIPASE, AMYLASE in the last 168 hours. No results for input(s): AMMONIA in the last 168 hours. Coagulation Profile: Recent Labs  Lab 02/27/21 1811  INR 1.1   Cardiac Enzymes: No results for input(s): CKTOTAL, CKMB, CKMBINDEX, TROPONINI in the last 168 hours. BNP (last 3 results) No results for input(s): PROBNP in the last 8760 hours. HbA1C: No results for input(s): HGBA1C in the last 72 hours. CBG: Recent Labs  Lab 02/27/21 2150 02/28/21 0404 02/28/21 0746 02/28/21 1101 02/28/21 1630  GLUCAP 101* 101* 87 101* 183*   Lipid Profile: No results for input(s): CHOL, HDL, LDLCALC, TRIG, CHOLHDL, LDLDIRECT in the last 72 hours. Thyroid Function Tests: No results for input(s): TSH, T4TOTAL, FREET4, T3FREE, THYROIDAB in the last 72 hours. Anemia Panel: No results for input(s): VITAMINB12, FOLATE, FERRITIN, TIBC, IRON, RETICCTPCT in the last 72 hours. Urine analysis: No results found for: COLORURINE, APPEARANCEUR, LABSPEC, PHURINE, GLUCOSEU, HGBUR, BILIRUBINUR, KETONESUR, PROTEINUR,  UROBILINOGEN, NITRITE, LEUKOCYTESUR Sepsis Labs: @LABRCNTIP (procalcitonin:4,lacticidven:4)  ) Recent Results (from the past 240 hour(s))  Resp Panel by RT-PCR (Flu A&B, Covid) Nasopharyngeal Swab     Status: None   Collection Time: 02/27/21  6:11 PM   Specimen: Nasopharyngeal Swab; Nasopharyngeal(NP) swabs in vial transport medium  Result Value Ref Range Status   SARS Coronavirus 2 by RT PCR NEGATIVE NEGATIVE Final    Comment: (NOTE) SARS-CoV-2 target nucleic acids are NOT DETECTED.  The SARS-CoV-2 RNA is generally detectable in upper respiratory specimens during the acute phase of infection. The lowest concentration of SARS-CoV-2 viral copies this assay can detect is 138 copies/mL. A negative result does not preclude SARS-Cov-2 infection and should not be used as the sole basis for treatment or other patient management decisions. A negative result may occur with  improper specimen collection/handling, submission of specimen other than nasopharyngeal swab, presence of viral mutation(s) within the areas targeted by this assay, and inadequate number of viral copies(<138 copies/mL). A negative result must be combined with clinical observations, patient history, and epidemiological information. The expected result is Negative.  Fact Sheet for Patients:  EntrepreneurPulse.com.au  Fact Sheet for Healthcare Providers:  IncredibleEmployment.be  This test is no t yet approved or cleared by the [place] and  has been authorized for detection and/or diagnosis of SARS-CoV-2 by FDA under an Emergency Use Authorization (EUA). This EUA will remain  in effect (meaning this test can be used) for the duration of the COVID-19 declaration under Section 564(b)(1) of the Act, 21 U.S.C.section 360bbb-3(b)(1), unless the authorization is terminated  or revoked sooner.       Influenza A by PCR NEGATIVE NEGATIVE Final   Influenza B by PCR NEGATIVE  NEGATIVE Final    Comment: (NOTE) The Xpert Xpress SARS-CoV-2/FLU/RSV plus assay is intended as an aid in the diagnosis of influenza from Nasopharyngeal swab specimens and should not be used as a sole basis for treatment. Nasal washings and aspirates are unacceptable for Xpert Xpress SARS-CoV-2/FLU/RSV testing.  Fact Sheet for Patients: EntrepreneurPulse.com.au  Fact Sheet for Healthcare Providers: IncredibleEmployment.be  This test is not yet approved or cleared by the [place] FDA and has been authorized for detection and/or diagnosis of SARS-CoV-2 by FDA under an Emergency Use Authorization (EUA). This EUA will remain in effect (meaning this test can be used) for the duration of the COVID-19 declaration under Section 564(b)(1) of the Act, 21 U.S.C. section 360bbb-3(b)(1), unless the authorization is terminated or revoked.  Performed at [facility], [place] [address] [place], [place] [address]   Blood Culture (routine x 2)     Status: None (Preliminary result)   Collection Time: 02/27/21  6:16 PM   Specimen: BLOOD LEFT HAND  Result Value Ref Range Status   Specimen Description BLOOD LEFT HAND  Final   Special Requests   Final    BOTTLES DRAWN AEROBIC AND ANAEROBIC Blood Culture results may not be optimal due to an inadequate volume of blood received in culture bottles   Culture   Final    NO GROWTH < 12 HOURS Performed at [facility], [place] [address], [place], [place] [address]    Report Status PENDING  Incomplete  Blood Culture (routine x 2)     Status: None (Preliminary result)   Collection Time: 02/27/21  6:18 PM   Specimen: BLOOD  Result Value Ref Range Status   Specimen Description BLOOD LEFT ANTECUBITAL  Final   Special Requests   Final    BOTTLES DRAWN AEROBIC AND ANAEROBIC Blood Culture adequate volume   Culture   Final    NO GROWTH < 12 HOURS Performed at [facility], [place] [address] [place],  [place] [address]    Report Status PENDING  Incomplete  Surgical PCR screen     Status: None   Collection Time: 02/28/21  4:46 AM   Specimen: Nasal Mucosa; Nasal Swab  Result Value Ref Range Status   MRSA, PCR NEGATIVE NEGATIVE Final   Staphylococcus aureus NEGATIVE NEGATIVE Final    Comment: (NOTE) The Xpert SA Assay (FDA approved for NASAL specimens in patients 58 years of age and older), is one component of a comprehensive surveillance program. It is not intended to diagnose infection nor to guide or monitor treatment. Performed at [facility], [place] [address] [place], [place] [address]          Radiology Studies: PERIPHERAL VASCULAR CATHETERIZATION  Result Date: 02/28/2021 Patient name: Michael Blackwell MRN: [id number] DOB: 1941-04-15 Sex: male 02/27/2021 - 02/28/2021 Pre-operative Diagnosis: Right leg rest pain Post-operative diagnosis:  Same Surgeon:  Annamarie Major Procedure Performed:  1.  Ultrasound-guided access, left femoral artery  2.  Abdominal aortogram  3.  Bilateral lower extremity runoff  4.  Second-order catheterization  5.  Conscious sedation, 47 minutes Indications: This is a 80 year old gentleman who has previously undergone intervention in his right leg.  He presented to the hospital with several day history of worsening pain.  He is here for further evaluation. Procedure:  The patient was identified in the holding area and taken to room 8.  The patient was then placed supine on the table and prepped and draped in the usual sterile fashion.  A time out was called.  Conscious sedation was administered with the use of IV fentanyl and Versed under continuous physician and nurse monitoring.  Heart rate, blood pressure, and oxygen saturation were continuously monitored.  Total sedation time was 40minutes.  Ultrasound was used to evaluate the left common femoral artery.  It was patent .  A digital ultrasound image was acquired.  A micropuncture needle was used to access the left  common femoral artery under ultrasound guidance.  An 018 wire was advanced without resistance and a micropuncture sheath was placed.  The 018 wire was removed and a benson wire was placed.  The micropuncture sheath was exchanged for a 5 french sheath.  An omniflush catheter was advanced over the wire to the level of L-1.  An abdominal angiogram was obtained.  Next, using the omniflush catheter and a benson wire, the aortic bifurcation was crossed and the catheter was placed into theright external iliac artery and right runoff was obtained.  left runoff was performed via retrograde sheath injections. Findings:  Aortogram: No significant renal artery stenosis was identified.  The infrarenal abdominal aorta is heavily calcified but without significant stenosis.  Bilateral common and external iliac arteries are heavily calcified but patent without hemodynamically significant stenosis.  Right Lower Extremity: Approximate 50 to 60% stenosis within the right common femoral artery.  There is a stent extending into the common femoral artery.  The stent occludes at the origin of the superficial femoral artery.  There is reconstitution of the diseased above-knee popliteal artery.  The below-knee popliteal artery is patent with two-vessel runoff via the posterior tibial and peroneal artery.  There is ostial stenosis at the origin of the profundofemoral artery  Left Lower Extremity: Severely diseased common femoral artery and proximal profundofemoral artery.  The superficial femoral artery is occluded with reconstitution at the knee.  There is single-vessel runoff via the peroneal artery Intervention: None Impression:  #1  Occluded right superficial femoral artery with ostial profunda stenosis.  There is reconstitution of a diseased above-knee popliteal artery.  The below-knee popliteal artery is without stenosis.  There is two-vessel runoff  #2  Due to the stent within the common femoral artery and profunda stenosis, surgical  revascularization would be preferred.  #3 severely diseased left common femoral artery with occluded superficial femoral artery and single-vessel runoff via the peroneal artery  V. Annamarie Major, M.D., [facility] [place] Office: [phone] Pager:  [phone]  DG Chest Port 1 View  Result Date: 02/27/2021 CLINICAL DATA:  Possible sepsis EXAM: PORTABLE CHEST 1 VIEW COMPARISON:  11/16/2019 FINDINGS: Cardiac shadow is stable. Lungs are clear. No bony abnormality is noted. IMPRESSION: No acute abnormality noted. Electronically Signed   By: Inez Catalina M.D.   On: 02/27/2021 19:20   VAS [place] LOWER EXTREMITY SAPHENOUS VEIN MAPPING  Result Date: 02/28/2021 LOWER EXTREMITY VEIN MAPPING Patient Name:  Michael Blackwell  Date of Exam:   02/28/2021 Medical Rec #: [id number]        Accession #:  [id number] Date of Birth: [birth year]        Patient Gender: M Patient Age:   79Y Exam Location:  [facility] Procedure:      VAS [place] LOWER EXTREMITY SAPHENOUS VEIN MAPPING Referring Phys: [id number] Serafina Mitchell --------------------------------------------------------------------------------  Indications: Pre-op  Comparison Study: No previous exams Performing Technologist: Rogelia Rohrer  Examination Guidelines: A complete evaluation includes B-mode imaging, spectral Doppler, color Doppler, and power Doppler as needed of all accessible portions of each vessel. Bilateral testing is considered an integral part of a complete examination. Limited examinations for reoccurring indications may be performed as noted. +--------------+-----------+-------------------+--------------+----------------+  RT Diameter  RT Findings        GSV         LT Diameter    LT Findings         (cm)                                        (cm)                      +--------------+-----------+-------------------+--------------+----------------+      0.63                  Saphenofemoral                  not  visualized                                Junction                      and Bandage                                                                  present      +--------------+-----------+-------------------+--------------+----------------+      0.48      branching   Proximal thigh        0.45                      +--------------+-----------+-------------------+--------------+----------------+      0.38                     Mid thigh          0.32                      +--------------+-----------+-------------------+--------------+----------------+      0.28      branching    Distal thigh         0.24                      +--------------+-----------+-------------------+--------------+----------------+      0.32                       Knee             0.29                      +--------------+-----------+-------------------+--------------+----------------+  0.25      branching      Prox calf          0.25        branching     +--------------+-----------+-------------------+--------------+----------------+      0.32                     Mid calf           0.18        branching     +--------------+-----------+-------------------+--------------+----------------+      0.32                    Distal calf         0.17                      +--------------+-----------+-------------------+--------------+----------------+      0.31                       Ankle            0.19                      +--------------+-----------+-------------------+--------------+----------------+    Preliminary         Scheduled Meds: . heparin  5,000 Units Subcutaneous Q8H  . insulin aspart  0-5 Units Subcutaneous QHS  . insulin aspart  0-6 Units Subcutaneous TID WC  . rosuvastatin  10 mg Oral Daily  . sodium chloride flush  3 mL Intravenous Q12H  . tamsulosin  0.4 mg Oral QHS   Continuous Infusions: . sodium chloride    . [START ON 03/01/2021] ceFEPime (MAXIPIME) IV     . [START ON 03/01/2021] vancomycin       LOS: 1 day    Time spent: 35 minutes    Dana Allan, MD  [facility] Pager #: [phone] 7PM-7AM contact night coverage as above

## 2021-02-28 NOTE — Progress Notes (Signed)
Pharmacy Antibiotic Note  Michael Blackwell is a 80 y.o. male admitted on 02/27/2021 with concern for sepsis.  Pharmacy has been consulted for vancomycin and cefepime dosing.  Plan: Vancomycin 1500mg  x1 then 750mg  IV every HD.  Goal pre-HD level 15-25 mcg/mL. Cefepime 1g IV Q24H.  Height: 5\' 8"  (172.7 cm) Weight: 76.2 kg (168 lb) IBW/kg (Calculated) : 68.4  Temp (24hrs), Avg:99 F (37.2 C), Min:98.4 F (36.9 C), Max:100.1 F (37.8 C)  Recent Labs  Lab 02/27/21 1811  WBC 7.6  CREATININE 6.10*  LATICACIDVEN 1.3    Estimated Creatinine Clearance: 9.8 mL/min (A) (by C-G formula based on SCr of 6.1 mg/dL (H)).    No Known Allergies   Thank you for allowing pharmacy to be a part of this patient's care.  Wynona Neat, PharmD, BCPS  02/28/2021 1:56 AM

## 2021-02-28 NOTE — Op Note (Signed)
    Patient name: Michael Blackwell MRN: [id number] DOB: [birth year] Sex: male  02/27/2021 - 02/28/2021 Pre-operative Diagnosis: Right leg rest pain Post-operative diagnosis:  Same Surgeon:  Annamarie Major Procedure Performed:  1.  Ultrasound-guided access, left femoral artery  2.  Abdominal aortogram  3.  Bilateral lower extremity runoff  4.  Second-order catheterization  5.  Conscious sedation, 47 minutes   Indications: This is a 80 year old gentleman who has previously undergone intervention in his right leg.  He presented to the hospital with several day history of worsening pain.  He is here for further evaluation.  Procedure:  The patient was identified in the holding area and taken to room 8.  The patient was then placed supine on the table and prepped and draped in the usual sterile fashion.  A time out was called.  Conscious sedation was administered with the use of IV fentanyl and Versed under continuous physician and nurse monitoring.  Heart rate, blood pressure, and oxygen saturation were continuously monitored.  Total sedation time was 63minutes.  Ultrasound was used to evaluate the left common femoral artery.  It was patent .  A digital ultrasound image was acquired.  A micropuncture needle was used to access the left common femoral artery under ultrasound guidance.  An 018 wire was advanced without resistance and a micropuncture sheath was placed.  The 018 wire was removed and a benson wire was placed.  The micropuncture sheath was exchanged for a 5 french sheath.  An omniflush catheter was advanced over the wire to the level of L-1.  An abdominal angiogram was obtained.  Next, using the omniflush catheter and a benson wire, the aortic bifurcation was crossed and the catheter was placed into theright external iliac artery and right runoff was obtained.  left runoff was performed via retrograde sheath injections.  Findings:   Aortogram: No significant renal artery stenosis was identified.   The infrarenal abdominal aorta is heavily calcified but without significant stenosis.  Bilateral common and external iliac arteries are heavily calcified but patent without hemodynamically significant stenosis.  Right Lower Extremity: Approximate 50 to 60% stenosis within the right common femoral artery.  There is a stent extending into the common femoral artery.  The stent occludes at the origin of the superficial femoral artery.  There is reconstitution of the diseased above-knee popliteal artery.  The below-knee popliteal artery is patent with two-vessel runoff via the posterior tibial and peroneal artery.  There is ostial stenosis at the origin of the profundofemoral artery  Left Lower Extremity: Severely diseased common femoral artery and proximal profundofemoral artery.  The superficial femoral artery is occluded with reconstitution at the knee.  There is single-vessel runoff via the peroneal artery  Intervention: None  Impression:  #1  Occluded right superficial femoral artery with ostial profunda stenosis.  There is reconstitution of a diseased above-knee popliteal artery.  The below-knee popliteal artery is without stenosis.  There is two-vessel runoff  #2  Due to the stent within the common femoral artery and profunda stenosis, surgical revascularization would be preferred.  #3 severely diseased left common femoral artery with occluded superficial femoral artery and single-vessel runoff via the peroneal artery    V. Annamarie Major, M.D., [facility] [place] Office: [phone] Pager:  [phone]

## 2021-02-28 NOTE — Progress Notes (Signed)
Patient brought to 4E from cath lab. Telemetry box applied, CCMD notified. CHG bath completed. VSS. Patient stated pain 0/10. Groin site dressing clean, dry, intact. Educated patient on importance of bedrest until 2:30pm. Patient verbalized understanding. Oriented patient to staff and room. Call bell in reach.  Daymon Larsen, RN

## 2021-02-28 NOTE — Progress Notes (Signed)
BLE vein mapping has been completed.  Results can be found under chart review under CV PROC. 02/28/2021 4:21 PM Ofelia Podolski RVT, RDMS

## 2021-02-28 NOTE — Interval H&P Note (Signed)
History and Physical Interval Note:  02/28/2021 8:36 AM  Michael Blackwell  has presented today for surgery, with the diagnosis of right foot pain.  The various methods of treatment have been discussed with the patient and family. After consideration of risks, benefits and other options for treatment, the patient has consented to  Procedure(s): ABDOMINAL AORTOGRAM W/LOWER EXTREMITY (N/A) as a surgical intervention.  The patient's history has been reviewed, patient examined, no change in status, stable for surgery.  I have reviewed the patient's chart and labs.  Questions were answered to the patient's satisfaction.     Annamarie Major

## 2021-03-01 ENCOUNTER — Encounter (HOSPITAL_COMMUNITY): Payer: Self-pay | Admitting: Internal Medicine

## 2021-03-01 ENCOUNTER — Encounter (HOSPITAL_COMMUNITY)
Admission: EM | Disposition: A | Discharge status: Home / Self Care | Payer: Self-pay | Source: Home / Self Care | Attending: Internal Medicine

## 2021-03-01 ENCOUNTER — Inpatient Hospital Stay (HOSPITAL_COMMUNITY): Payer: Medicare Other | Admitting: Anesthesiology

## 2021-03-01 DIAGNOSIS — I998 Other disorder of circulatory system: Secondary | ICD-10-CM | POA: Diagnosis not present

## 2021-03-01 HISTORY — PX: PATCH ANGIOPLASTY: SHX6230

## 2021-03-01 HISTORY — PX: ENDARTERECTOMY FEMORAL: SHX5804

## 2021-03-01 LAB — GLUCOSE, CAPILLARY
Glucose-Capillary: 89 mg/dL (ref 70–99)
Glucose-Capillary: 81 mg/dL (ref 70–99)
Glucose-Capillary: 103 mg/dL — ABNORMAL HIGH (ref 70–99)
Glucose-Capillary: 153 mg/dL — ABNORMAL HIGH (ref 70–99)
Glucose-Capillary: 247 mg/dL — ABNORMAL HIGH (ref 70–99)

## 2021-03-01 LAB — BASIC METABOLIC PANEL
Anion gap: 19 — ABNORMAL HIGH (ref 5–15)
BUN: 54 mg/dL — ABNORMAL HIGH (ref 8–23)
CO2: 22 mmol/L (ref 22–32)
Calcium: 8.9 mg/dL (ref 8.9–10.3)
Chloride: 95 mmol/L — ABNORMAL LOW (ref 98–111)
Creatinine, Ser: 8.4 mg/dL — ABNORMAL HIGH (ref 0.61–1.24)
GFR, Estimated: 6 mL/min — ABNORMAL LOW (ref >60)
Glucose, Bld: 63 mg/dL — ABNORMAL LOW (ref 70–99)
Potassium: 4.3 mmol/L (ref 3.5–5.1)
Sodium: 136 mmol/L (ref 135–145)

## 2021-03-01 LAB — PROTIME-INR
INR: 1.1 (ref 0.8–1.2)
Prothrombin Time: 13.9 seconds (ref 11.4–15.2)

## 2021-03-01 LAB — CBC
HCT: 35.3 % — ABNORMAL LOW (ref 39.0–52.0)
HCT: 33.5 % — ABNORMAL LOW (ref 39.0–52.0)
Hemoglobin: 11.5 g/dL — ABNORMAL LOW (ref 13.0–17.0)
Hemoglobin: 10.8 g/dL — ABNORMAL LOW (ref 13.0–17.0)
MCH: 29.3 pg (ref 26.0–34.0)
MCH: 29.4 pg (ref 26.0–34.0)
MCHC: 32.6 g/dL (ref 30.0–36.0)
MCHC: 32.2 g/dL (ref 30.0–36.0)
MCV: 89.8 fL (ref 80.0–100.0)
MCV: 91.3 fL (ref 80.0–100.0)
Platelets: 186 10*3/uL (ref 150–400)
Platelets: 172 10*3/uL (ref 150–400)
RBC: 3.93 MIL/uL — ABNORMAL LOW (ref 4.22–5.81)
RBC: 3.67 MIL/uL — ABNORMAL LOW (ref 4.22–5.81)
RDW: 16.3 % — ABNORMAL HIGH (ref 11.5–15.5)
RDW: 16.4 % — ABNORMAL HIGH (ref 11.5–15.5)
WBC: 6.5 10*3/uL (ref 4.0–10.5)
WBC: 7.9 10*3/uL (ref 4.0–10.5)
nRBC: 0 % (ref 0.0–0.2)
nRBC: 0 % (ref 0.0–0.2)

## 2021-03-01 LAB — CREATININE, SERUM
Creatinine, Ser: 8.91 mg/dL — ABNORMAL HIGH (ref 0.61–1.24)
GFR, Estimated: 6 mL/min — ABNORMAL LOW (ref >60)

## 2021-03-01 SURGERY — BYPASS GRAFT FEMORAL-POPLITEAL ARTERY
Anesthesia: General | Laterality: Right

## 2021-03-01 SURGERY — ENDARTERECTOMY, FEMORAL
Anesthesia: General | Site: Leg Upper | Laterality: Right

## 2021-03-01 MED ORDER — ROSUVASTATIN CALCIUM 5 MG PO TABS: 10.0000 mg | ORAL_TABLET | Freq: Every day | ORAL | Status: DC

## 2021-03-01 MED ORDER — ONDANSETRON HCL 4 MG/2ML IJ SOLN: 4.0000 mg | Freq: Four times a day (QID) | INTRAMUSCULAR | Status: DC | PRN

## 2021-03-01 MED ORDER — POLYETHYLENE GLYCOL 3350 17 G PO PACK: 17.0000 g | PACK | Freq: Every day | ORAL | Status: DC | PRN

## 2021-03-01 MED ORDER — FENTANYL CITRATE (PF) 100 MCG/2ML IJ SOLN
INTRAMUSCULAR | Status: DC | PRN
  Administered 2021-03-01: 50 ug via INTRAVENOUS
  Administered 2021-03-01: 100 ug via INTRAVENOUS

## 2021-03-01 MED ORDER — PROPOFOL 10 MG/ML IV BOLUS
INTRAVENOUS | Status: AC
  Filled 2021-03-01: qty 20

## 2021-03-01 MED ORDER — POTASSIUM CHLORIDE CRYS ER 20 MEQ PO TBCR
20.0000 meq | EXTENDED_RELEASE_TABLET | Freq: Every day | ORAL | Status: DC | PRN
Start: 2021-03-01 — End: 2021-03-02

## 2021-03-01 MED ORDER — DEXAMETHASONE SODIUM PHOSPHATE 10 MG/ML IJ SOLN
INTRAMUSCULAR | Status: DC | PRN
  Administered 2021-03-01: 10 mg via INTRAVENOUS

## 2021-03-01 MED ORDER — CEFAZOLIN SODIUM-DEXTROSE 2-3 GM-%(50ML) IV SOLR
INTRAVENOUS | Status: DC | PRN
  Administered 2021-03-01: 2 g via INTRAVENOUS

## 2021-03-01 MED ORDER — SENNOSIDES-DOCUSATE SODIUM 8.6-50 MG PO TABS: 1.0000 | ORAL_TABLET | Freq: Every evening | ORAL | Status: DC | PRN

## 2021-03-01 MED ORDER — FLEET ENEMA 7-19 GM/118ML RE ENEM: 1.0000 | ENEMA | Freq: Once | RECTAL | Status: DC | PRN

## 2021-03-01 MED ORDER — MIDAZOLAM HCL 2 MG/2ML IJ SOLN
INTRAMUSCULAR | Status: AC
  Filled 2021-03-01: qty 2

## 2021-03-01 MED ORDER — HYDROMORPHONE HCL 1 MG/ML IJ SOLN: 0.5000 mg | INTRAMUSCULAR | Status: DC | PRN

## 2021-03-01 MED ORDER — HEMOSTATIC AGENTS (NO CHARGE) OPTIME
TOPICAL | Status: DC | PRN
  Administered 2021-03-01: 1 via TOPICAL

## 2021-03-01 MED ORDER — PHENOL 1.4 % MT LIQD
1.0000 | OROMUCOSAL | Status: DC | PRN
Start: 2021-03-01 — End: 2021-03-02

## 2021-03-01 MED ORDER — CEFAZOLIN SODIUM-DEXTROSE 2-4 GM/100ML-% IV SOLN
2.0000 g | Freq: Once | INTRAVENOUS | Status: AC
  Administered 2021-03-02: 2 g via INTRAVENOUS
  Filled 2021-03-01: qty 100

## 2021-03-01 MED ORDER — FENTANYL CITRATE (PF) 250 MCG/5ML IJ SOLN
INTRAMUSCULAR | Status: AC
  Filled 2021-03-01: qty 5

## 2021-03-01 MED ORDER — CEFAZOLIN SODIUM-DEXTROSE 2-4 GM/100ML-% IV SOLN: 2.0000 g | Freq: Three times a day (TID) | INTRAVENOUS | Status: DC

## 2021-03-01 MED ORDER — TAMSULOSIN HCL 0.4 MG PO CAPS
0.4000 mg | ORAL_CAPSULE | Freq: Every day | ORAL | Status: DC
Start: 2021-03-01 — End: 2021-03-02
  Administered 2021-03-01: 0.4 mg via ORAL
  Filled 2021-03-01: qty 1

## 2021-03-01 MED ORDER — LABETALOL HCL 5 MG/ML IV SOLN
10.0000 mg | INTRAVENOUS | Status: DC | PRN
  Administered 2021-03-01: 10 mg via INTRAVENOUS
  Filled 2021-03-01: qty 4

## 2021-03-01 MED ORDER — OXYCODONE-ACETAMINOPHEN 5-325 MG PO TABS
1.0000 | ORAL_TABLET | ORAL | Status: DC | PRN
  Filled 2021-03-01: qty 1

## 2021-03-01 MED ORDER — METOPROLOL TARTRATE 5 MG/5ML IV SOLN: 2.0000 mg | INTRAVENOUS | Status: DC | PRN

## 2021-03-01 MED ORDER — OXYCODONE HCL 5 MG/5ML PO SOLN: 5.0000 mg | Freq: Once | ORAL | Status: DC | PRN

## 2021-03-01 MED ORDER — GABAPENTIN 100 MG PO CAPS
100.0000 mg | ORAL_CAPSULE | Freq: Three times a day (TID) | ORAL | Status: DC
  Administered 2021-03-01 – 2021-03-02 (×2): 100 mg via ORAL
  Filled 2021-03-01 (×2): qty 1

## 2021-03-01 MED ORDER — SODIUM CHLORIDE 0.9 % IV SOLN: 500.0000 mL | Freq: Once | INTRAVENOUS | Status: DC | PRN

## 2021-03-01 MED ORDER — MIDAZOLAM HCL 2 MG/2ML IJ SOLN
1.0000 mg | INTRAMUSCULAR | Status: AC | PRN
  Administered 2021-03-01 (×2): 1 mg via INTRAVENOUS

## 2021-03-01 MED ORDER — FENTANYL CITRATE (PF) 100 MCG/2ML IJ SOLN
25.0000 ug | INTRAMUSCULAR | Status: DC | PRN
  Administered 2021-03-01: 50 ug via INTRAVENOUS

## 2021-03-01 MED ORDER — SODIUM CHLORIDE 0.9 % IV SOLN
INTRAVENOUS | Status: AC
  Filled 2021-03-01: qty 1.2

## 2021-03-01 MED ORDER — ALUM & MAG HYDROXIDE-SIMETH 200-200-20 MG/5ML PO SUSP: 15.0000 mL | ORAL | Status: DC | PRN

## 2021-03-01 MED ORDER — HEPARIN SODIUM (PORCINE) 1000 UNIT/ML IJ SOLN
INTRAMUSCULAR | Status: DC | PRN
  Administered 2021-03-01: 6000 [IU] via INTRAVENOUS

## 2021-03-01 MED ORDER — AMLODIPINE BESYLATE 5 MG PO TABS
5.0000 mg | ORAL_TABLET | Freq: Every day | ORAL | Status: DC
  Administered 2021-03-01: 5 mg via ORAL
  Filled 2021-03-01: qty 1

## 2021-03-01 MED ORDER — PHENYLEPHRINE HCL-NACL 10-0.9 MG/250ML-% IV SOLN
INTRAVENOUS | Status: DC | PRN
  Administered 2021-03-01: 50 ug/min via INTRAVENOUS

## 2021-03-01 MED ORDER — APREPITANT 40 MG PO CAPS: 40.0000 mg | ORAL_CAPSULE | Freq: Once | ORAL | Status: DC

## 2021-03-01 MED ORDER — ONDANSETRON HCL 4 MG/2ML IJ SOLN: 4.0000 mg | Freq: Once | INTRAMUSCULAR | Status: DC | PRN

## 2021-03-01 MED ORDER — ACETAMINOPHEN 325 MG PO TABS: 325.0000 mg | ORAL_TABLET | ORAL | Status: DC | PRN

## 2021-03-01 MED ORDER — AMLODIPINE BESYLATE 10 MG PO TABS: 10.0000 mg | ORAL_TABLET | Freq: Every day | ORAL | Status: DC

## 2021-03-01 MED ORDER — MAGNESIUM SULFATE 2 GM/50ML IV SOLN: 2.0000 g | Freq: Every day | INTRAVENOUS | Status: DC | PRN

## 2021-03-01 MED ORDER — SODIUM CHLORIDE 0.9 % IV SOLN: INTRAVENOUS | Status: DC

## 2021-03-01 MED ORDER — HYDRALAZINE HCL 20 MG/ML IJ SOLN: 5.0000 mg | INTRAMUSCULAR | Status: DC | PRN

## 2021-03-01 MED ORDER — PROTAMINE SULFATE 10 MG/ML IV SOLN
INTRAVENOUS | Status: DC | PRN
  Administered 2021-03-01: 20 mg via INTRAVENOUS
  Administered 2021-03-01: 10 mg via INTRAVENOUS
  Administered 2021-03-01: 20 mg via INTRAVENOUS

## 2021-03-01 MED ORDER — DOCUSATE SODIUM 100 MG PO CAPS
100.0000 mg | ORAL_CAPSULE | Freq: Every day | ORAL | Status: DC
  Administered 2021-03-02: 100 mg via ORAL
  Filled 2021-03-01: qty 1

## 2021-03-01 MED ORDER — CHLORHEXIDINE GLUCONATE 0.12 % MT SOLN
15.0000 mL | OROMUCOSAL | Status: AC
  Administered 2021-03-01: 15 mL via OROMUCOSAL
  Filled 2021-03-01 (×2): qty 15

## 2021-03-01 MED ORDER — ISOSORBIDE MONONITRATE ER 30 MG PO TB24: 30.0000 mg | ORAL_TABLET | Freq: Every evening | ORAL | Status: DC

## 2021-03-01 MED ORDER — PANTOPRAZOLE SODIUM 40 MG PO TBEC
40.0000 mg | DELAYED_RELEASE_TABLET | Freq: Every day | ORAL | Status: DC
  Administered 2021-03-02: 40 mg via ORAL
  Filled 2021-03-01: qty 1

## 2021-03-01 MED ORDER — FENTANYL CITRATE (PF) 100 MCG/2ML IJ SOLN
INTRAMUSCULAR | Status: AC
  Administered 2021-03-01: 50 ug via INTRAVENOUS
  Filled 2021-03-01: qty 2

## 2021-03-01 MED ORDER — ACETAMINOPHEN 650 MG RE SUPP: 325.0000 mg | RECTAL | Status: DC | PRN

## 2021-03-01 MED ORDER — GUAIFENESIN-DM 100-10 MG/5ML PO SYRP: 15.0000 mL | ORAL_SOLUTION | ORAL | Status: DC | PRN

## 2021-03-01 MED ORDER — SODIUM CHLORIDE 0.9 % IV SOLN
INTRAVENOUS | Status: DC | PRN
  Administered 2021-03-01: 500 mL

## 2021-03-01 MED ORDER — BISACODYL 5 MG PO TBEC: 5.0000 mg | DELAYED_RELEASE_TABLET | Freq: Every day | ORAL | Status: DC | PRN

## 2021-03-01 MED ORDER — CHLORHEXIDINE GLUCONATE CLOTH 2 % EX PADS: 6.0000 | MEDICATED_PAD | Freq: Every day | CUTANEOUS | Status: DC

## 2021-03-01 MED ORDER — SUGAMMADEX SODIUM 200 MG/2ML IV SOLN
INTRAVENOUS | Status: DC | PRN
  Administered 2021-03-01: 200 mg via INTRAVENOUS

## 2021-03-01 MED ORDER — LIDOCAINE-PRILOCAINE 2.5-2.5 % EX CREA: 1.0000 "application " | TOPICAL_CREAM | Freq: Every day | CUTANEOUS | Status: DC | PRN

## 2021-03-01 MED ORDER — LIDOCAINE 2% (20 MG/ML) 5 ML SYRINGE
INTRAMUSCULAR | Status: DC | PRN
  Administered 2021-03-01: 60 mg via INTRAVENOUS

## 2021-03-01 MED ORDER — EPHEDRINE SULFATE-NACL 50-0.9 MG/10ML-% IV SOSY
PREFILLED_SYRINGE | INTRAVENOUS | Status: DC | PRN
  Administered 2021-03-01 (×3): 10 mg via INTRAVENOUS

## 2021-03-01 MED ORDER — ROCURONIUM BROMIDE 10 MG/ML (PF) SYRINGE
PREFILLED_SYRINGE | INTRAVENOUS | Status: DC | PRN
  Administered 2021-03-01: 10 mg via INTRAVENOUS
  Administered 2021-03-01: 70 mg via INTRAVENOUS

## 2021-03-01 MED ORDER — CLONIDINE HCL 0.2 MG PO TABS
0.3000 mg | ORAL_TABLET | Freq: Two times a day (BID) | ORAL | Status: DC
  Administered 2021-03-01 – 2021-03-02 (×2): 0.3 mg via ORAL
  Filled 2021-03-01 (×2): qty 1

## 2021-03-01 MED ORDER — HYDRALAZINE HCL 50 MG PO TABS
100.0000 mg | ORAL_TABLET | Freq: Three times a day (TID) | ORAL | Status: DC
  Administered 2021-03-01 – 2021-03-02 (×2): 100 mg via ORAL
  Filled 2021-03-01 (×2): qty 2

## 2021-03-01 MED ORDER — ASPIRIN EC 81 MG PO TBEC
81.0000 mg | DELAYED_RELEASE_TABLET | Freq: Every day | ORAL | Status: DC
  Administered 2021-03-01: 81 mg via ORAL
  Filled 2021-03-01: qty 1

## 2021-03-01 MED ORDER — CLOPIDOGREL BISULFATE 75 MG PO TABS
75.0000 mg | ORAL_TABLET | Freq: Every day | ORAL | Status: DC
  Administered 2021-03-01 – 2021-03-02 (×2): 75 mg via ORAL
  Filled 2021-03-01 (×2): qty 1

## 2021-03-01 MED ORDER — PROPOFOL 10 MG/ML IV BOLUS
INTRAVENOUS | Status: DC | PRN
  Administered 2021-03-01: 100 mg via INTRAVENOUS
  Administered 2021-03-01: 70 mg via INTRAVENOUS
  Administered 2021-03-01: 30 mg via INTRAVENOUS

## 2021-03-01 MED ORDER — ONDANSETRON HCL 4 MG/2ML IJ SOLN
INTRAMUSCULAR | Status: DC | PRN
  Administered 2021-03-01: 4 mg via INTRAVENOUS

## 2021-03-01 MED ORDER — LIDOCAINE 2% (20 MG/ML) 5 ML SYRINGE
INTRAMUSCULAR | Status: AC
  Filled 2021-03-01: qty 5

## 2021-03-01 MED ORDER — HEPARIN SODIUM (PORCINE) 5000 UNIT/ML IJ SOLN
5000.0000 [IU] | Freq: Three times a day (TID) | INTRAMUSCULAR | Status: DC
  Administered 2021-03-02: 5000 [IU] via SUBCUTANEOUS
  Filled 2021-03-01: qty 1

## 2021-03-01 MED ORDER — LABETALOL HCL 5 MG/ML IV SOLN
INTRAVENOUS | Status: DC | PRN
  Administered 2021-03-01 (×3): 5 mg via INTRAVENOUS

## 2021-03-01 MED ORDER — LABETALOL HCL 5 MG/ML IV SOLN: 5.0000 mg | INTRAVENOUS | Status: DC | PRN

## 2021-03-01 MED ORDER — FENTANYL CITRATE (PF) 100 MCG/2ML IJ SOLN
50.0000 ug | Freq: Once | INTRAMUSCULAR | Status: AC
Start: 2021-03-01 — End: 2021-03-01

## 2021-03-01 MED ORDER — OXYCODONE HCL 5 MG PO TABS
5.0000 mg | ORAL_TABLET | Freq: Once | ORAL | Status: DC | PRN
Start: 2021-03-01 — End: 2021-03-01

## 2021-03-01 MED ORDER — PHENYLEPHRINE 40 MCG/ML (10ML) SYRINGE FOR IV PUSH (FOR BLOOD PRESSURE SUPPORT)
PREFILLED_SYRINGE | INTRAVENOUS | Status: DC | PRN
  Administered 2021-03-01: 200 ug via INTRAVENOUS
  Administered 2021-03-01: 120 ug via INTRAVENOUS

## 2021-03-01 MED ORDER — 0.9 % SODIUM CHLORIDE (POUR BTL) OPTIME
TOPICAL | Status: DC | PRN
  Administered 2021-03-01: 2000 mL

## 2021-03-01 MED ORDER — FENTANYL CITRATE (PF) 100 MCG/2ML IJ SOLN
INTRAMUSCULAR | Status: AC
  Filled 2021-03-01: qty 2

## 2021-03-01 MED FILL — Labetalol HCl IV Soln Prefilled Syringe 20 MG/4ML (5 MG/ML): INTRAVENOUS | Qty: 4 | Status: AC

## 2021-03-01 SURGICAL SUPPLY — 50 items
ADH SKN CLS APL DERMABOND .7 (GAUZE/BANDAGES/DRESSINGS) ×1
AGENT HMST SPONGE THK3/8 (HEMOSTASIS)
BLADE CLIPPER SURG (BLADE) ×4 IMPLANT
CANISTER SUCT 3000ML PPV (MISCELLANEOUS) ×4 IMPLANT
CANNULA VESSEL 3MM 2 BLNT TIP (CANNULA) ×4 IMPLANT
CLIP LIGATING EXTRA MED SLVR (CLIP) ×4 IMPLANT
CLIP LIGATING EXTRA SM BLUE (MISCELLANEOUS) ×4 IMPLANT
CLIP VESOCCLUDE MED 24/CT (CLIP) ×4 IMPLANT
CLIP VESOCCLUDE SM WIDE 24/CT (CLIP) ×4 IMPLANT
COVER PROBE W GEL 5X96 (DRAPES) ×4 IMPLANT
DERMABOND ADVANCED (GAUZE/BANDAGES/DRESSINGS) ×1
DERMABOND ADVANCED .7 DNX12 (GAUZE/BANDAGES/DRESSINGS) ×3 IMPLANT
DRAIN CHANNEL 15F RND FF W/TCR (WOUND CARE) IMPLANT
DRAPE HALF SHEET 40X57 (DRAPES) IMPLANT
ELECT REM PT RETURN 9FT ADLT (ELECTROSURGICAL) ×4
ELECTRODE REM PT RTRN 9FT ADLT (ELECTROSURGICAL) ×3 IMPLANT
GLOVE BIO SURGEON STRL SZ7.5 (GLOVE) ×4 IMPLANT
GLOVE SRG 8 PF TXTR STRL LF DI (GLOVE) ×3 IMPLANT
GLOVE SURG UNDER POLY LF SZ6.5 (GLOVE) ×4 IMPLANT
GLOVE SURG UNDER POLY LF SZ7.5 (GLOVE) ×4 IMPLANT
GLOVE SURG UNDER POLY LF SZ8 (GLOVE) ×4
GOWN STRL REUS W/ TWL LRG LVL3 (GOWN DISPOSABLE) ×15 IMPLANT
GOWN STRL REUS W/TWL LRG LVL3 (GOWN DISPOSABLE) ×20
HEMOSTAT SPONGE AVITENE ULTRA (HEMOSTASIS) IMPLANT
INSERT FOGARTY SM (MISCELLANEOUS) IMPLANT
KIT BASIN OR (CUSTOM PROCEDURE TRAY) ×4 IMPLANT
KIT TURNOVER KIT B (KITS) ×4 IMPLANT
NS IRRIG 1000ML POUR BTL (IV SOLUTION) ×8 IMPLANT
PACK PERIPHERAL VASCULAR (CUSTOM PROCEDURE TRAY) ×4 IMPLANT
PAD ARMBOARD 7.5X6 YLW CONV (MISCELLANEOUS) ×8 IMPLANT
POWDER SURGICEL 3.0 GRAM (HEMOSTASIS) ×4 IMPLANT
SET MICROPUNCTURE 5F STIFF (MISCELLANEOUS) IMPLANT
SPONGE LAP 18X18 RF (DISPOSABLE) ×4 IMPLANT
STOPCOCK 4 WAY LG BORE MALE ST (IV SETS) IMPLANT
SUT ETHILON 3 0 PS 1 (SUTURE) IMPLANT
SUT GORETEX 5 0 TT13 24 (SUTURE) IMPLANT
SUT GORETEX 6.0 TT13 (SUTURE) IMPLANT
SUT MNCRL AB 4-0 PS2 18 (SUTURE) ×8 IMPLANT
SUT PROLENE 5 0 C 1 24 (SUTURE) ×28 IMPLANT
SUT PROLENE 6 0 BV (SUTURE) ×4 IMPLANT
SUT VIC AB 2-0 CT1 27 (SUTURE) ×8
SUT VIC AB 2-0 CT1 TAPERPNT 27 (SUTURE) ×6 IMPLANT
SUT VIC AB 3-0 SH 27 (SUTURE) ×12
SUT VIC AB 3-0 SH 27X BRD (SUTURE) ×9 IMPLANT
TAPE UMBILICAL COTTON 1/8X30 (MISCELLANEOUS) IMPLANT
TOWEL GREEN STERILE (TOWEL DISPOSABLE) ×4 IMPLANT
TRAY FOLEY MTR SLVR 16FR STAT (SET/KITS/TRAYS/PACK) ×4 IMPLANT
TUBING EXTENTION W/L.L. (IV SETS) IMPLANT
UNDERPAD 30X36 HEAVY ABSORB (UNDERPADS AND DIAPERS) ×4 IMPLANT
WATER STERILE IRR 1000ML POUR (IV SOLUTION) ×4 IMPLANT

## 2021-03-01 NOTE — Anesthesia Postprocedure Evaluation (Signed)
Anesthesia Post Note  Patient: Michael Blackwell  Procedure(s) Performed: ENDARTERECTOMY FEMORAL (Leg Upper) PATCH ANGIOPLASTY USING RIGHT GREATER SAPHENOUS VEIN (Right Leg Upper)     Patient location during evaluation: PACU Anesthesia Type: General Level of consciousness: awake Pain management: pain level controlled Vital Signs Assessment: post-procedure vital signs reviewed and stable Respiratory status: spontaneous breathing, nonlabored ventilation, respiratory function stable and patient connected to nasal cannula oxygen Cardiovascular status: blood pressure returned to baseline and stable Postop Assessment: no apparent nausea or vomiting Anesthetic complications: no   No complications documented.  Last Vitals:  Vitals:   03/01/21 1647 03/01/21 1702  BP: (!) 165/59 (!) 163/61  Pulse: 81 82  Resp: 15 16  Temp:    SpO2: 97% 94%    Last Pain:  Vitals:   03/01/21 1125  TempSrc: Oral  PainSc:                  Audry Pili

## 2021-03-01 NOTE — Consult Note (Signed)
[facility] Renal Consultation Note    Indication for Consultation:  Management of ESRD/hemodialysis, anemia, hypertension/volume, and secondary hyperparathyroidism.  HPI: Michael Blackwell is a 80 y.o. male with PMH including ESRD on dialysis MWF, HTN, OSA, prostate cancer, PVD, and T2DM who presented to [facility] with R foot pain. Patient was febrile, slightly tachycardic and hypotensive on arrival. Renal labs were at baseline and no leukocytosis. He was given 2L LR on 5/30 and vascular surgery was consulted for foot pain worsening over the past several weeks. Vascular surgery recommended aortogram with right lower extremity arteriogram, which was completed 02/28/21 and showed occluded R superficial femoral artery and severely diseased left common femoral artery. Appears current plan is for surgical reconstruction. Nephrology was consulted for management of ESRD during his admission.   This AM, labs notable for K+ 4.3, BUN 54, Cr 8.40, Ca 8.9, Hgb 11.5. Moderately hypertensive. He is below his dry weight of 77kg and reports he has not been eating or drinking much due to NPO status. Last HD was Monday and was completed without issue. He denies any shortness of breath, orthopnea, chest pain, palpitations, dizziness, abdominal pain, nausea, vomiting, and diarrhea. No leg pain at present. No recent fevers/chills. Reports his hand does get numb towards the end of his dialysis treatments.   Past Medical History:  Diagnosis Date  . Acute respiratory failure with hypoxia ([place]) 12/05/2016   [name] 12/05/2016  . Anemia   . Anxiety   . Arthritis    "joints ache at night sometimes" (3/8/20180  . Chronic kidney disease (CKD), stage II (mild)    Acute on chronic kidney disease stage II-III/notes 12/06/2016  . Dyspnea    with exertion  . ESRD (end stage renal disease) ([place])    Hemo TTSAT [place]  . Heart murmur    never has caused any problems per patient  . History of blood transfusion   . History  of radiation therapy 02/25/14- 04/23/14   prostate 7800 cGy 40 sessions, seminal vesicles 5600 cGy 40 sessions  . Hypercholesteremia   . Hypertension   . OSA on CPAP   . Pneumonia 12/06/2016  . Prostate cancer ([place]) 11/19/13   gleason 4+3=7, volume 34.74 cc  . Spermatocele    right  . Type II diabetes mellitus (HCC)    diet controlled  . Wears partial dentures    upper   Past Surgical History:  Procedure Laterality Date  . ABDOMINAL AORTOGRAM W/LOWER EXTREMITY N/A 07/11/2020   Procedure: ABDOMINAL AORTOGRAM W/LOWER EXTREMITY;  Surgeon: [name], MD;  Location: [place] [facility];  Service: Cardiovascular;  Laterality: N/A;  . ABDOMINAL AORTOGRAM W/LOWER EXTREMITY Bilateral 02/28/2021   Procedure: ABDOMINAL AORTOGRAM W/LOWER EXTREMITY;  Surgeon: Serafina Mitchell, MD;  Location: [place] [facility];  Service: Cardiovascular;  Laterality: Bilateral;  . AV FISTULA PLACEMENT Right 03/04/2019   Procedure: RIGHT ARM ARTERIOVENOUS  FISTULA CREATION;  Surgeon: [name], MD;  Location: [place];  Service: Vascular;  Laterality: Right;  . AV FISTULA PLACEMENT Right 12/30/2019   Procedure: INSERTION OF right  arm ARTERIOVENOUS (AV) GORE-TEX GRAFT;  Surgeon: [name], MD;  Location: [place];  Service: Vascular;  Laterality: Right;  . BASCILIC VEIN TRANSPOSITION Right 05/26/2019   Procedure: Right Arm BASILIC VEIN TRANSPOSITION SECOND STAGE;  Surgeon: [name], MD;  Location: [place];  Service: Vascular;  Laterality: Right;  . COLONOSCOPY    . LOWER EXTREMITY ANGIOGRAPHY Bilateral 02/25/2018   Procedure: Lower Extremity  Angiography;  Surgeon: [name], MD;  Location: [place] [facility];  Service: Cardiovascular;  Laterality: Bilateral;  limited  . PERIPHERAL VASCULAR ATHERECTOMY Right 07/11/2020   Procedure: PERIPHERAL VASCULAR ATHERECTOMY;  Surgeon: [name], MD;  Location: [place] [facility];  Service: Cardiovascular;   Laterality: Right;  common femoral; sfa  . PERIPHERAL VASCULAR BALLOON ANGIOPLASTY Right 07/11/2020   Procedure: PERIPHERAL VASCULAR BALLOON ANGIOPLASTY;  Surgeon: [name], MD;  Location: [place] [facility];  Service: Cardiovascular;  Laterality: Right;  Profunda  . PERIPHERAL VASCULAR INTERVENTION Right 07/11/2020   Procedure: PERIPHERAL VASCULAR INTERVENTION;  Surgeon: [name], MD;  Location: [place] [facility];  Service: Cardiovascular;  Laterality: Right;  common femoral and superficial femoral stent  . PROSTATE BIOPSY  11/19/13   Gleason 4+3=7, vol 34.74 cc  . RENAL ANGIOGRAPHY N/A 02/25/2018   Procedure: RENAL ANGIOGRAPHY;  Surgeon: [name], MD;  Location: [place] [facility];  Service: Cardiovascular;  Laterality: N/A;  . TONSILLECTOMY     as child   Family History  Problem Relation Age of Onset  . Cancer Brother        brain  . Cancer Mother        stomach  . Cancer Father        ? prostate   Social History:  reports that he quit smoking about 20 months ago. His smoking use included cigarettes. He quit after 48.00 years of use. He has never used smokeless tobacco. He reports previous alcohol use. He reports that he does not use drugs.  ROS: As per HPI otherwise negative.  Physical Exam: Vitals:   02/28/21 1937 02/28/21 2314 03/01/21 0341 03/01/21 0820  BP: (!) 149/63 (!) 153/63 (!) 192/77 (!) 154/74  Pulse: 86 78 80 (!) 103  Resp: 20 18 19 18   Temp: 98.4 F (36.9 C) 98.6 F (37 C) 98.6 F (37 C) 99 F (37.2 C)  TempSrc: Oral Oral Oral Oral  SpO2: 99% 99% 99% 97%  Weight:      Height:         General: Well developed, well nourished, in no acute distress. Head: Normocephalic, atraumatic, sclera non-icteric, mucus membranes are moist. Neck:JVD not elevated. Lungs: Clear bilaterally to auscultation without wheezes, rales, or rhonchi. Breathing is unlabored. Heart: RRR with normal S1, S2. No murmurs, rubs, or gallops  appreciated. Abdomen: Soft, non-tender, non-distended with normoactive bowel sounds. No rebound/guarding. No obvious abdominal masses. Musculoskeletal:  Strength and tone appear normal for age. Lower extremities: No edema bilateral lower extremities, R foot covered Neuro: Alert and oriented X 3. Moves all extremities spontaneously. Psych:  Responds to questions appropriately with a normal affect. Dialysis Access: RUE AVF + thrill and bruit  No Known Allergies Prior to Admission medications   Medication Sig Start Date End Date Taking? Authorizing Provider  amLODipine (NORVASC) 10 MG tablet Take 5 mg by mouth at bedtime.    Yes [provider]  aspirin EC 81 MG tablet Take 81 mg by mouth at bedtime.   Yes [provider]  B Complex-C (B-COMPLEX WITH VITAMIN C) tablet Take 1 tablet by mouth daily.   Yes [provider]  Calcium Carb-Cholecalciferol 600-800 MG-UNIT TABS Take 1 tablet by mouth daily.    Yes [provider]  calcium carbonate (TUMS - DOSED IN MG ELEMENTAL CALCIUM) 500 MG chewable tablet Chew 1 tablet by mouth 3 (three) times daily after meals.   Yes [provider]  cloNIDine (CATAPRES) 0.3 MG tablet Take 0.6 mg by mouth 2 (two) times daily.  12/12/15  Yes [provider]  clopidogrel (PLAVIX) 75 MG tablet Take 1 tablet (75 mg total) by mouth daily. 08/05/20 08/05/21 Yes Dagoberto Ligas, PA-C  gabapentin (NEURONTIN) 100 MG capsule Take 1 capsule (100 mg total) by mouth 3 (three) times daily. 02/17/21  Yes Regal, Tamala Fothergill, DPM  hydrALAZINE (APRESOLINE) 100 MG tablet Take 100 mg by mouth 3 (three) times daily.    Yes [provider]  isosorbide mononitrate (IMDUR) 30 MG 24 hr tablet Take 30 mg by mouth every evening.    Yes [provider]  lidocaine-prilocaine (EMLA) cream Apply 1 application topically daily as needed (prior to port being accessed).  02/22/20  Yes [provider]  Omega-3 Fatty Acids (OMEGA 3  PO) Take 1,040 mg by mouth daily.    Yes [provider]  polyethylene glycol (MIRALAX / GLYCOLAX) 17 g packet Take 17 g by mouth daily as needed (constipation.).   Yes [provider]  rosuvastatin (CRESTOR) 10 MG tablet Take 1 tablet (10 mg total) by mouth daily. 06/21/20  Yes Adrian Prows, MD  tamsulosin (FLOMAX) 0.4 MG CAPS capsule Take 1 capsule (0.4 mg total) by mouth every other day. Patient taking differently: Take 0.4 mg by mouth at bedtime. 11/16/19  Yes Aline August, MD  vitamin E 180 MG (400 UNITS) capsule Take 400 Units by mouth daily.    Yes [provider]   Current Facility-Administered Medications  Medication Dose Route Frequency Provider Last Rate Last Admin  . 0.9 %  sodium chloride infusion  250 mL Intravenous PRN Serafina Mitchell, MD 10 mL/hr at 03/01/21 0002 250 mL at 03/01/21 0002  . acetaminophen (TYLENOL) tablet 650 mg  650 mg Oral Q6H PRN Shela Leff, MD       Or  . acetaminophen (TYLENOL) suppository 650 mg  650 mg Rectal Q6H PRN Shela Leff, MD      . acetaminophen (TYLENOL) tablet 650 mg  650 mg Oral Q4H PRN Serafina Mitchell, MD      . ceFEPIme (MAXIPIME) 1 g in sodium chloride 0.9 % 100 mL IVPB  1 g Intravenous Q24H Laren Everts, RPH 200 mL/hr at 03/01/21 0006 1 g at 03/01/21 0006  . gabapentin (NEURONTIN) capsule 100 mg  100 mg Oral TID Mansy, Jan A, MD   100 mg at 03/01/21 0831  . heparin injection 5,000 Units  5,000 Units Subcutaneous Q8H Shela Leff, MD   5,000 Units at 02/28/21 2104  . hydrALAZINE (APRESOLINE) injection 5 mg  5 mg Intravenous Q20 Min PRN Serafina Mitchell, MD      . insulin aspart (novoLOG) injection 0-5 Units  0-5 Units Subcutaneous QHS Shela Leff, MD      . insulin aspart (novoLOG) injection 0-6 Units  0-6 Units Subcutaneous TID WC Shela Leff, MD   1 Units at 02/28/21 1635  . labetalol (NORMODYNE) injection 10 mg  10 mg Intravenous Q10 min PRN Serafina Mitchell, MD      . morphine  2 MG/ML injection 2 mg  2 mg Intravenous Q4H PRN Mansy, Jan A, MD      . ondansetron [facility]) injection 4 mg  4 mg Intravenous Q6H PRN Serafina Mitchell, MD      . rosuvastatin (CRESTOR) tablet 10 mg  10 mg Oral Daily Shela Leff, MD   10 mg at 03/01/21 0831  . sodium chloride flush (NS) 0.9 %  injection 3 mL  3 mL Intravenous Q12H Serafina Mitchell, MD   3 mL at 03/01/21 6734  . sodium chloride flush (NS) 0.9 % injection 3 mL  3 mL Intravenous PRN Serafina Mitchell, MD      . tamsulosin (FLOMAX) capsule 0.4 mg  0.4 mg Oral QHS Shela Leff, MD   0.4 mg at 02/28/21 2104  . vancomycin (VANCOCIN) IVPB 750 mg/150 ml premix  750 mg Intravenous Q M,W,F-HD Laren Everts, [facility]       Labs: Basic Metabolic Panel: Recent Labs  Lab 02/27/21 1811 03/01/21 0142  NA 136 136  K 4.0 4.3  CL 90* 95*  CO2 31 22  GLUCOSE 119* 63*  BUN 32* 54*  CREATININE 6.10* 8.40*  CALCIUM 9.0 8.9   Liver Function Tests: Recent Labs  Lab 02/27/21 1811  AST 17  ALT 13  ALKPHOS 68  BILITOT 0.5  PROT 7.8  ALBUMIN 3.8   CBC: Recent Labs  Lab 02/27/21 1811 03/01/21 0142  WBC 7.6 6.5  NEUTROABS 5.7  --   HGB 12.4* 11.5*  HCT 38.8* 35.3*  MCV 91.5 89.8  PLT 199 186   CBG: Recent Labs  Lab 02/28/21 0746 02/28/21 1101 02/28/21 1630 02/28/21 2102 03/01/21 0603  GLUCAP 87 101* 183* 121* 89   Iron Studies: No results for input(s): IRON, TIBC, TRANSFERRIN, FERRITIN in the last 72 hours. Studies/Results: PERIPHERAL VASCULAR CATHETERIZATION  Result Date: 02/28/2021 Patient name: Michael Blackwell MRN: [id number] DOB: 1940/12/31 Sex: male 02/27/2021 - 02/28/2021 Pre-operative Diagnosis: Right leg rest pain Post-operative diagnosis:  Same Surgeon:  Annamarie Major Procedure Performed:  1.  Ultrasound-guided access, left femoral artery  2.  Abdominal aortogram  3.  Bilateral lower extremity runoff  4.  Second-order catheterization  5.  Conscious sedation, 47 minutes Indications: This is a 80 year old  gentleman who has previously undergone intervention in his right leg.  He presented to the hospital with several day history of worsening pain.  He is here for further evaluation. Procedure:  The patient was identified in the holding area and taken to room 8.  The patient was then placed supine on the table and prepped and draped in the usual sterile fashion.  A time out was called.  Conscious sedation was administered with the use of IV fentanyl and Versed under continuous physician and nurse monitoring.  Heart rate, blood pressure, and oxygen saturation were continuously monitored.  Total sedation time was 44minutes.  Ultrasound was used to evaluate the left common femoral artery.  It was patent .  A digital ultrasound image was acquired.  A micropuncture needle was used to access the left common femoral artery under ultrasound guidance.  An 018 wire was advanced without resistance and a micropuncture sheath was placed.  The 018 wire was removed and a benson wire was placed.  The micropuncture sheath was exchanged for a 5 french sheath.  An omniflush catheter was advanced over the wire to the level of L-1.  An abdominal angiogram was obtained.  Next, using the omniflush catheter and a benson wire, the aortic bifurcation was crossed and the catheter was placed into theright external iliac artery and right runoff was obtained.  left runoff was performed via retrograde sheath injections. Findings:  Aortogram: No significant renal artery stenosis was identified.  The infrarenal abdominal aorta is heavily calcified but without significant stenosis.  Bilateral common and external iliac arteries are heavily calcified but patent without hemodynamically significant stenosis.  Right Lower Extremity:  Approximate 50 to 60% stenosis within the right common femoral artery.  There is a stent extending into the common femoral artery.  The stent occludes at the origin of the superficial femoral artery.  There is reconstitution of  the diseased above-knee popliteal artery.  The below-knee popliteal artery is patent with two-vessel runoff via the posterior tibial and peroneal artery.  There is ostial stenosis at the origin of the profundofemoral artery  Left Lower Extremity: Severely diseased common femoral artery and proximal profundofemoral artery.  The superficial femoral artery is occluded with reconstitution at the knee.  There is single-vessel runoff via the peroneal artery Intervention: None Impression:  #1  Occluded right superficial femoral artery with ostial profunda stenosis.  There is reconstitution of a diseased above-knee popliteal artery.  The below-knee popliteal artery is without stenosis.  There is two-vessel runoff  #2  Due to the stent within the common femoral artery and profunda stenosis, surgical revascularization would be preferred.  #3 severely diseased left common femoral artery with occluded superficial femoral artery and single-vessel runoff via the peroneal artery  V. Annamarie Major, M.D., [facility] [place] Office: [phone] Pager:  [phone]  DG Chest Port 1 View  Result Date: 02/27/2021 CLINICAL DATA:  Possible sepsis EXAM: PORTABLE CHEST 1 VIEW COMPARISON:  11/16/2019 FINDINGS: Cardiac shadow is stable. Lungs are clear. No bony abnormality is noted. IMPRESSION: No acute abnormality noted. Electronically Signed   By: Inez Catalina M.D.   On: 02/27/2021 19:20   VAS [place] LOWER EXTREMITY SAPHENOUS VEIN MAPPING  Result Date: 02/28/2021 LOWER EXTREMITY VEIN MAPPING Patient Name:  Michael Blackwell  Date of Exam:   02/28/2021 Medical Rec #: [id number]        Accession #:    [id number] Date of Birth: Sep 22, 1941        Patient Gender: M Patient Age:   79Y Exam Location:  [facility] Procedure:      VAS [place] LOWER EXTREMITY SAPHENOUS VEIN MAPPING Referring Phys: [id number] Serafina Mitchell --------------------------------------------------------------------------------  Indications:  Pre-op  Comparison Study: No previous exams Performing Technologist: Rogelia Rohrer  Examination Guidelines: A complete evaluation includes B-mode imaging, spectral Doppler, color Doppler, and power Doppler as needed of all accessible portions of each vessel. Bilateral testing is considered an integral part of a complete examination. Limited examinations for reoccurring indications may be performed as noted. +--------------+-----------+-------------------+--------------+----------------+  RT Diameter  RT Findings        GSV         LT Diameter    LT Findings         (cm)                                        (cm)                      +--------------+-----------+-------------------+--------------+----------------+      0.63                  Saphenofemoral                  not visualized                                Junction  and Bandage                                                                  present      +--------------+-----------+-------------------+--------------+----------------+      0.48      branching   Proximal thigh        0.45                      +--------------+-----------+-------------------+--------------+----------------+      0.38                     Mid thigh          0.32                      +--------------+-----------+-------------------+--------------+----------------+      0.28      branching    Distal thigh         0.24                      +--------------+-----------+-------------------+--------------+----------------+      0.32                       Knee             0.29                      +--------------+-----------+-------------------+--------------+----------------+      0.25      branching      Prox calf          0.25        branching     +--------------+-----------+-------------------+--------------+----------------+      0.32                     Mid calf           0.18        branching      +--------------+-----------+-------------------+--------------+----------------+      0.32                    Distal calf         0.17                      +--------------+-----------+-------------------+--------------+----------------+      0.31                       Ankle            0.19                      +--------------+-----------+-------------------+--------------+----------------+ Diagnosing physician: Deitra Mayo MD Electronically signed by Deitra Mayo MD on 02/28/2021 at 6:43:20 PM.    Final     Outpatient Dialysis Orders:  Center: [facility]  on MWF. 180NRe, 4 hours, BFR 400, DFR 500, EDW 77kg, 2K/2Ca, AVG 15g Heparin 2300 unit bolus Mircera 50 mcg IV q 4 weeks Calcitriol 0.81mcg PO q HD Velphoro 1 tab PO TID with meals  Home BP meds: amlodipine 10mg  QHS, clonidine 0.1mg  1 tab QAM and 2 tabs QHS, hydralazine 100mg  BID  Assessment/Plan: 1.  PAD: Underwent arteriogram yesterday, planned for surgical reconstruction. Management per vascular surgery. On prophylactic heparin.  2. ?Sepsis: Hypotensive and tachycardic on arrival, now resolved. On Empiric antibiotics per primary team.  3.  ESRD:  On MWF schedule, will plan for HD today.  4.  Hypertension/volume: BP slightly elevated, no volume overload on exam and he is below his outpatient EDW, likely due to NPO status. Minimal UF with HD today. Will resume home amlodipine tonight. 5.  Anemia: Hgb at goal. No ESA indicated at this time. 6.  Metabolic bone disease: Calcium controlled. Continue calcitriol and velphoro once eating, trend phos level.  7.  Nutrition:  Currently NPO 8. T2DM: diet controlled  Anice Paganini, PA-C 03/01/2021, 8:34 AM  [facility] Pager: [phone]

## 2021-03-01 NOTE — Anesthesia Procedure Notes (Signed)
Arterial Line Insertion Start/End6/10/2020 12:00 PM, 03/01/2021 12:15 PM Performed by: Audry Pili, MD, Georgia Duff, CRNA, CRNA  Lidocaine 1% used for infiltration Left, radial was placed Catheter size: 22 G  Attempts: 2 Procedure performed without using ultrasound guided technique. Following insertion, Biopatch and dressing applied. Post procedure assessment: unchanged  Patient tolerated the procedure well with no immediate complications.

## 2021-03-01 NOTE — Anesthesia Procedure Notes (Signed)
Procedure Name: Intubation Date/Time: 03/01/2021 12:33 PM Performed by: Georgia Duff, CRNA Pre-anesthesia Checklist: Patient identified, Emergency Drugs available, Suction available and Patient being monitored Patient Re-evaluated:Patient Re-evaluated prior to induction Oxygen Delivery Method: Circle System Utilized Preoxygenation: Pre-oxygenation with 100% oxygen Induction Type: IV induction Ventilation: Mask ventilation without difficulty Laryngoscope Size: Miller and 3 Grade View: Grade I Tube type: Oral Tube size: 7.5 mm Number of attempts: 1 Airway Equipment and Method: Stylet and Oral airway Placement Confirmation: ETT inserted through vocal cords under direct vision,  positive ETCO2 and breath sounds checked- equal and bilateral Secured at: 22 cm Tube secured with: Tape Dental Injury: Teeth and Oropharynx as per pre-operative assessment

## 2021-03-01 NOTE — Progress Notes (Signed)
Pt refused to sign an inform consent at this moment. Pt prefers to talk with MD before making decision for surgery.  Pt agrees for pre-op prep protocol, NPO AMN. We will hand off to a morning round.   Kennyth Lose, RN

## 2021-03-01 NOTE — Progress Notes (Signed)
Mobility Specialist - Progress Note   03/01/21 1034  Mobility  Activity Ambulated in hall  Level of Assistance Contact guard assist, steadying assist  Assistive Device Cane  Distance Ambulated (ft) 50 ft  Mobility Ambulated with assistance in hallway  Mobility Response Tolerated well  Mobility performed by Mobility specialist  $Mobility charge 1 Mobility   Distance limited by RLE discomfort. Pt back in bed after walk, call bell at side. VSS throughout.  Pricilla Handler Mobility Specialist Mobility Specialist Phone: [phone]

## 2021-03-01 NOTE — Anesthesia Preprocedure Evaluation (Addendum)
Anesthesia Evaluation  Patient identified by MRN, date of birth, ID band Patient awake    Reviewed: Allergy & Precautions, NPO status , Patient's Chart, lab work & pertinent test results  History of Anesthesia Complications Negative for: history of anesthetic complications  Airway Mallampati: II  TM Distance: >3 FB Neck ROM: Full    Dental  (+) Dental Advisory Given, Edentulous Upper   Pulmonary sleep apnea and Continuous Positive Airway Pressure Ventilation , Patient abstained from smoking., former smoker,    Pulmonary exam normal        Cardiovascular hypertension, Pt. on medications + CAD, + Peripheral Vascular Disease and +CHF  Normal cardiovascular exam   '22 TTE - EF 30 to 35%. Global hypokinesis. Mild left ventricular hypertrophy. Grade I diastolic dysfunction (impaired relaxation). Mild hypokinesis of the left ventricular, entire inferolateral wall and inferoseptal wall. Left atrial size was severely dilated. Trivial mitral valve regurgitation.     Neuro/Psych PSYCHIATRIC DISORDERS Anxiety  Neuromuscular disease    GI/Hepatic negative GI ROS, Neg liver ROS,   Endo/Other  diabetes, Type 2Hypothyroidism   Renal/GU ESRF and DialysisRenal disease     Musculoskeletal  (+) Arthritis ,   Abdominal   Peds  Hematology  (+) anemia ,  On plavix    Anesthesia Other Findings   Reproductive/Obstetrics                            Anesthesia Physical Anesthesia Plan  ASA: III  Anesthesia Plan: General   Post-op Pain Management:    Induction: Intravenous  PONV Risk Score and Plan: 3 and Treatment may vary due to age or medical condition, Ondansetron and Aprepitant  Airway Management Planned: Oral ETT  Additional Equipment: None and Arterial line  Intra-op Plan:   Post-operative Plan: Extubation in OR  Informed Consent: I have reviewed the patients History and Physical, chart, labs  and discussed the procedure including the risks, benefits and alternatives for the proposed anesthesia with the patient or authorized representative who has indicated his/her understanding and acceptance.     Dental advisory given  Plan Discussed with: CRNA and Anesthesiologist  Anesthesia Plan Comments:        Anesthesia Quick Evaluation

## 2021-03-01 NOTE — Progress Notes (Signed)
Patient returned to 4E from OR. Patient VSS. Telemetry box applied, CCMD notified. VSS. Patient states 0/10 pain. Educated patient on bed rest and nothing to eat by mouth until 7:20pm. Patient verbalized understanding. Wife present.  Daymon Larsen, RN

## 2021-03-01 NOTE — Progress Notes (Signed)
PROGRESS NOTE    Michael Blackwell  ZOX:[id number] DOB: 1941/04/26 DOA: 02/27/2021 PCP: Rogers Blocker, MD   Brief Narrative:  Patient is a 80 year old African-American male with past medical history significant for end-stage renal disease on hemodialysis on Monday, Wednesday and Friday; PAD, hypertension, hyperlipidemia, diet-controlled type 2 diabetes, OSA on CPAP, history of prostate cancer.  Patient was admitted with about 40-month history of right foot pain.  Patient underwent aortogram and runoff earlier today.   Assessment & Plan:   Principal Problem:   Lower limb ischemia Active Problems:   Type 2 diabetes mellitus with renal manifestations (HCC)   Essential hypertension, malignant   OSA on CPAP   Dependence on renal dialysis ([place])   Peripheral arterial disease/ right lower extremity ischemia: - Patient underwent ureterogram and runoff showing somewhat profound bilateral disease, plan for revascularization and bypass today of the right leg with vascular surgery   - Continue Crestor. - Continue prophylactic heparin.  Sepsis ruled out - Patient has tachycardia with suspected infection of lower extremity and transient hypotension which does not meet sepsis criteria - Procalcitonin was 0.45, however, patient has end-stage renal disease -Discontinue antibiotics and follow for symptoms, cultures remain negative patient has negative procalcitonin otherwise no acute overt infection of note.  ESRD on HDMWF -Nephrology following, appreciate insight and recommendations -Patient has right upper extremity AV graft. -Likely continue hemodialysis Monday, Wednesday and Friday schedule.  Hypertension -Somewhat poorly controlled -Continue amlodipine, hydralazine, isosorbide  -consider increasing hydralazine or additional clonidine if necessary in the next 24 to 48 hours if not well controlled  Hyperlipidemia -Continue statin  Diet controlled type 2 diabetes, well controlled -Continue  sliding scale insulin coverage. -Carb modified diet.  OSA -Continue nightly CPAP   DVT prophylaxis: Heparin Code Status: Full Family Communication: None present  Status is: Inpatient  Dispo: The patient is from: Home              Anticipated d/c is to: To be determined              Anticipated d/c date is: 73 to 72 hours              Patient currently not medically stable for discharge  Consultants:   Vascular surgery, nephrology  Procedures:   Revascularization/bypass right femoral artery 03/01/2021  Antimicrobials:  Vancomycin, cefepime  Subjective: No acute issues or events overnight, somewhat anxious about procedure but denies nausea vomiting diarrhea constipation headache fevers chills chest pain or shortness of breath.  Objective: Vitals:   03/01/21 1632 03/01/21 1647 03/01/21 1702 03/01/21 1717  BP: (!) 158/66 (!) 165/59 (!) 163/61 (!) 169/67  Pulse: 62 81 82 80  Resp: 20 15 16 14   Temp:    (!) 97 F (36.1 C)  TempSrc:      SpO2: 99% 97% 94% 95%  Weight:      Height:        Intake/Output Summary (Last 24 hours) at 03/01/2021 1729 Last data filed at 03/01/2021 1455 Gross per 24 hour  Intake 914.32 ml  Output 250 ml  Net 664.32 ml   Filed Weights   02/27/21 1811 03/01/21 1127  Weight: 76.2 kg 76.2 kg    Examination:  General exam: Appears calm and comfortable  Respiratory system: Clear to auscultation. Respiratory effort normal. Cardiovascular system: S1 & S2 heard, RRR. No JVD, murmurs, rubs, gallops or clicks. No pedal edema. Gastrointestinal system: Abdomen is nondistended, soft and nontender. No organomegaly or masses felt. Normal bowel  sounds heard. Central nervous system: Alert and oriented. No focal neurological deficits. Extremities: Symmetric 5 x 5 power. Skin: No rashes, lesions or ulcers Psychiatry: Judgement and insight appear normal. Mood & affect appropriate.     Data Reviewed: I have personally reviewed following labs and imaging  studies  CBC: Recent Labs  Lab 02/27/21 1811 03/01/21 0142  WBC 7.6 6.5  NEUTROABS 5.7  --   HGB 12.4* 11.5*  HCT 38.8* 35.3*  MCV 91.5 89.8  PLT 199 063   Basic Metabolic Panel: Recent Labs  Lab 02/27/21 1811 03/01/21 0142  NA 136 136  K 4.0 4.3  CL 90* 95*  CO2 31 22  GLUCOSE 119* 63*  BUN 32* 54*  CREATININE 6.10* 8.40*  CALCIUM 9.0 8.9   GFR: Estimated Creatinine Clearance: 7.1 mL/min (A) (by C-G formula based on SCr of 8.4 mg/dL (H)). Liver Function Tests: Recent Labs  Lab 02/27/21 1811  AST 17  ALT 13  ALKPHOS 68  BILITOT 0.5  PROT 7.8  ALBUMIN 3.8   No results for input(s): LIPASE, AMYLASE in the last 168 hours. No results for input(s): AMMONIA in the last 168 hours. Coagulation Profile: Recent Labs  Lab 02/27/21 1811 03/01/21 0142  INR 1.1 1.1   Cardiac Enzymes: No results for input(s): CKTOTAL, CKMB, CKMBINDEX, TROPONINI in the last 168 hours. BNP (last 3 results) No results for input(s): PROBNP in the last 8760 hours. HbA1C: Recent Labs    02/27/21 2120  HGBA1C 5.7*   CBG: Recent Labs  Lab 02/28/21 1630 02/28/21 2102 03/01/21 0603 03/01/21 1128 03/01/21 1518  GLUCAP 183* 121* 89 81 103*   Lipid Profile: No results for input(s): CHOL, HDL, LDLCALC, TRIG, CHOLHDL, LDLDIRECT in the last 72 hours. Thyroid Function Tests: No results for input(s): TSH, T4TOTAL, FREET4, T3FREE, THYROIDAB in the last 72 hours. Anemia Panel: No results for input(s): VITAMINB12, FOLATE, FERRITIN, TIBC, IRON, RETICCTPCT in the last 72 hours. Sepsis Labs: Recent Labs  Lab 02/27/21 1811 02/27/21 1818  PROCALCITON  --  0.45  LATICACIDVEN 1.3  --     Recent Results (from the past 240 hour(s))  Resp Panel by RT-PCR (Flu A&B, Covid) Nasopharyngeal Swab     Status: None   Collection Time: 02/27/21  6:11 PM   Specimen: Nasopharyngeal Swab; Nasopharyngeal(NP) swabs in vial transport medium  Result Value Ref Range Status   SARS Coronavirus 2 by RT PCR  NEGATIVE NEGATIVE Final    Comment: (NOTE) SARS-CoV-2 target nucleic acids are NOT DETECTED.  The SARS-CoV-2 RNA is generally detectable in upper respiratory specimens during the acute phase of infection. The lowest concentration of SARS-CoV-2 viral copies this assay can detect is 138 copies/mL. A negative result does not preclude SARS-Cov-2 infection and should not be used as the sole basis for treatment or other patient management decisions. A negative result may occur with  improper specimen collection/handling, submission of specimen other than nasopharyngeal swab, presence of viral mutation(s) within the areas targeted by this assay, and inadequate number of viral copies(<138 copies/mL). A negative result must be combined with clinical observations, patient history, and epidemiological information. The expected result is Negative.  Fact Sheet for Patients:  EntrepreneurPulse.com.au  Fact Sheet for Healthcare Providers:  IncredibleEmployment.be  This test is no t yet approved or cleared by the [place] FDA and  has been authorized for detection and/or diagnosis of SARS-CoV-2 by FDA under an Emergency Use Authorization (EUA). This EUA will remain  in effect (meaning this test can be  used) for the duration of the COVID-19 declaration under Section 564(b)(1) of the Act, 21 U.S.C.section 360bbb-3(b)(1), unless the authorization is terminated  or revoked sooner.       Influenza A by PCR NEGATIVE NEGATIVE Final   Influenza B by PCR NEGATIVE NEGATIVE Final    Comment: (NOTE) The Xpert Xpress SARS-CoV-2/FLU/RSV plus assay is intended as an aid in the diagnosis of influenza from Nasopharyngeal swab specimens and should not be used as a sole basis for treatment. Nasal washings and aspirates are unacceptable for Xpert Xpress SARS-CoV-2/FLU/RSV testing.  Fact Sheet for Patients: EntrepreneurPulse.com.au  Fact Sheet for  Healthcare Providers: IncredibleEmployment.be  This test is not yet approved or cleared by the [place] FDA and has been authorized for detection and/or diagnosis of SARS-CoV-2 by FDA under an Emergency Use Authorization (EUA). This EUA will remain in effect (meaning this test can be used) for the duration of the COVID-19 declaration under Section 564(b)(1) of the Act, 21 U.S.C. section 360bbb-3(b)(1), unless the authorization is terminated or revoked.  Performed at [facility], [place] [address], [place], [place] [address]   Blood Culture (routine x 2)     Status: None (Preliminary result)   Collection Time: 02/27/21  6:16 PM   Specimen: BLOOD LEFT HAND  Result Value Ref Range Status   Specimen Description BLOOD LEFT HAND  Final   Special Requests   Final    BOTTLES DRAWN AEROBIC AND ANAEROBIC Blood Culture results may not be optimal due to an inadequate volume of blood received in culture bottles   Culture   Final    NO GROWTH 2 DAYS Performed at [facility], [place] [address], [place], [place] [address]    Report Status PENDING  Incomplete  Blood Culture (routine x 2)     Status: None (Preliminary result)   Collection Time: 02/27/21  6:18 PM   Specimen: BLOOD  Result Value Ref Range Status   Specimen Description BLOOD LEFT ANTECUBITAL  Final   Special Requests   Final    BOTTLES DRAWN AEROBIC AND ANAEROBIC Blood Culture adequate volume   Culture   Final    NO GROWTH 2 DAYS Performed at [facility], [place] [address] [place], [place] [address]    Report Status PENDING  Incomplete  Surgical PCR screen     Status: None   Collection Time: 02/28/21  4:46 AM   Specimen: Nasal Mucosa; Nasal Swab  Result Value Ref Range Status   MRSA, PCR NEGATIVE NEGATIVE Final   Staphylococcus aureus NEGATIVE NEGATIVE Final    Comment: (NOTE) The Xpert SA Assay (FDA approved for NASAL specimens in patients 18 years of age and older), is one component of a  comprehensive surveillance program. It is not intended to diagnose infection nor to guide or monitor treatment. Performed at [facility], [place] [address], [place], [place] [address]          Radiology Studies: PERIPHERAL VASCULAR CATHETERIZATION  Result Date: 02/28/2021 Patient name: Michael Blackwell MRN: [id number] DOB: 1940/10/19 Sex: male 02/27/2021 - 02/28/2021 Pre-operative Diagnosis: Right leg rest pain Post-operative diagnosis:  Same Surgeon:  Annamarie Major Procedure Performed:  1.  Ultrasound-guided access, left femoral artery  2.  Abdominal aortogram  3.  Bilateral lower extremity runoff  4.  Second-order catheterization  5.  Conscious sedation, 47 minutes Indications: This is a 80 year old gentleman who has previously undergone intervention in his right leg.  He presented to the hospital with  several day history of worsening pain.  He is here for further evaluation. Procedure:  The patient was identified in the holding area and taken to room 8.  The patient was then placed supine on the table and prepped and draped in the usual sterile fashion.  A time out was called.  Conscious sedation was administered with the use of IV fentanyl and Versed under continuous physician and nurse monitoring.  Heart rate, blood pressure, and oxygen saturation were continuously monitored.  Total sedation time was 31minutes.  Ultrasound was used to evaluate the left common femoral artery.  It was patent .  A digital ultrasound image was acquired.  A micropuncture needle was used to access the left common femoral artery under ultrasound guidance.  An 018 wire was advanced without resistance and a micropuncture sheath was placed.  The 018 wire was removed and a benson wire was placed.  The micropuncture sheath was exchanged for a 5 french sheath.  An omniflush catheter was advanced over the wire to the level of L-1.  An abdominal angiogram was obtained.  Next, using the omniflush catheter and a benson wire, the  aortic bifurcation was crossed and the catheter was placed into theright external iliac artery and right runoff was obtained.  left runoff was performed via retrograde sheath injections. Findings:  Aortogram: No significant renal artery stenosis was identified.  The infrarenal abdominal aorta is heavily calcified but without significant stenosis.  Bilateral common and external iliac arteries are heavily calcified but patent without hemodynamically significant stenosis.  Right Lower Extremity: Approximate 50 to 60% stenosis within the right common femoral artery.  There is a stent extending into the common femoral artery.  The stent occludes at the origin of the superficial femoral artery.  There is reconstitution of the diseased above-knee popliteal artery.  The below-knee popliteal artery is patent with two-vessel runoff via the posterior tibial and peroneal artery.  There is ostial stenosis at the origin of the profundofemoral artery  Left Lower Extremity: Severely diseased common femoral artery and proximal profundofemoral artery.  The superficial femoral artery is occluded with reconstitution at the knee.  There is single-vessel runoff via the peroneal artery Intervention: None Impression:  #1  Occluded right superficial femoral artery with ostial profunda stenosis.  There is reconstitution of a diseased above-knee popliteal artery.  The below-knee popliteal artery is without stenosis.  There is two-vessel runoff  #2  Due to the stent within the common femoral artery and profunda stenosis, surgical revascularization would be preferred.  #3 severely diseased left common femoral artery with occluded superficial femoral artery and single-vessel runoff via the peroneal artery  V. Annamarie Major, M.D., [facility] [place] Office: [phone] Pager:  [phone]  DG Chest Port 1 View  Result Date: 02/27/2021 CLINICAL DATA:  Possible sepsis EXAM: PORTABLE CHEST 1 VIEW COMPARISON:   11/16/2019 FINDINGS: Cardiac shadow is stable. Lungs are clear. No bony abnormality is noted. IMPRESSION: No acute abnormality noted. Electronically Signed   By: Inez Catalina M.D.   On: 02/27/2021 19:20   VAS [place] LOWER EXTREMITY SAPHENOUS VEIN MAPPING  Result Date: 02/28/2021 LOWER EXTREMITY VEIN MAPPING Patient Name:  Michael Blackwell  Date of Exam:   02/28/2021 Medical Rec #: [id number]        Accession #:    [id number] Date of Birth: Oct 19, 1940        Patient Gender: M Patient Age:   79Y Exam Location[facility]  [facility] Procedure:  VAS [place] LOWER EXTREMITY SAPHENOUS VEIN MAPPING Referring Phys: [id number] Serafina Mitchell --------------------------------------------------------------------------------  Indications: Pre-op  Comparison Study: No previous exams Performing Technologist: Rogelia Rohrer  Examination Guidelines: A complete evaluation includes B-mode imaging, spectral Doppler, color Doppler, and power Doppler as needed of all accessible portions of each vessel. Bilateral testing is considered an integral part of a complete examination. Limited examinations for reoccurring indications may be performed as noted. +--------------+-----------+-------------------+--------------+----------------+  RT Diameter  RT Findings        GSV         LT Diameter    LT Findings         (cm)                                        (cm)                      +--------------+-----------+-------------------+--------------+----------------+      0.63                  Saphenofemoral                  not visualized                                Junction                      and Bandage                                                                  present      +--------------+-----------+-------------------+--------------+----------------+      0.48      branching   Proximal thigh        0.45                      +--------------+-----------+-------------------+--------------+----------------+       0.38                     Mid thigh          0.32                      +--------------+-----------+-------------------+--------------+----------------+      0.28      branching    Distal thigh         0.24                      +--------------+-----------+-------------------+--------------+----------------+      0.32                       Knee             0.29                      +--------------+-----------+-------------------+--------------+----------------+      0.25      branching      Prox calf          0.25        branching     +--------------+-----------+-------------------+--------------+----------------+  0.32                     Mid calf           0.18        branching     +--------------+-----------+-------------------+--------------+----------------+      0.32                    Distal calf         0.17                      +--------------+-----------+-------------------+--------------+----------------+      0.31                       Ankle            0.19                      +--------------+-----------+-------------------+--------------+----------------+ Diagnosing physician: Deitra Mayo MD Electronically signed by Deitra Mayo MD on 02/28/2021 at 6:43:20 PM.    Final         Scheduled Meds: . [MAR Hold] amLODipine  10 mg Oral QHS  . aprepitant  40 mg Oral Once  . [MAR Hold] Chlorhexidine Gluconate Cloth  6 each Topical Q0600  . fentaNYL      . [MAR Hold] gabapentin  100 mg Oral TID  . [MAR Hold] heparin  5,000 Units Subcutaneous Q8H  . [MAR Hold] insulin aspart  0-5 Units Subcutaneous QHS  . [MAR Hold] insulin aspart  0-6 Units Subcutaneous TID WC  . midazolam      . [MAR Hold] rosuvastatin  10 mg Oral Daily  . [MAR Hold] sodium chloride flush  3 mL Intravenous Q12H  . [MAR Hold] tamsulosin  0.4 mg Oral QHS   Continuous Infusions: . [MAR Hold] sodium chloride 250 mL (03/01/21 0002)  . sodium chloride 10 mL/hr at 03/01/21  1131  . [MAR Hold] ceFEPime (MAXIPIME) IV 1 g (03/01/21 0006)  . [MAR Hold] vancomycin       LOS: 2 days   Time spent: 23min  Mollyann Halbert C Charlott Calvario, DO [facility]  If 7PM-7AM, please contact night-coverage [email or web address]  03/01/2021, 5:29 PM

## 2021-03-01 NOTE — Progress Notes (Signed)
  Progress Note    03/01/2021 11:17 AM Day of Surgery  Subjective: Having right foot pain  Vitals:   03/01/21 0341 03/01/21 0820  BP: (!) 192/77 (!) 154/74  Pulse: 80 (!) 103  Resp: 19 18  Temp: 98.6 F (37 C) 99 F (37.2 C)  SpO2: 99% 97%    Physical Exam: Awake alert oriented Eyelid respirations 1+ palpable right common femoral pulse Right foot with rubor Left groin soft no hematoma  CBC    Component Value Date/Time   WBC 6.5 03/01/2021 0142   RBC 3.93 (L) 03/01/2021 0142   HGB 11.5 (L) 03/01/2021 0142   HGB 10.5 (L) 03/01/2016 1120   HCT 35.3 (L) 03/01/2021 0142   HCT 32.5 (L) 03/01/2016 1120   PLT 186 03/01/2021 0142   PLT 254 03/01/2016 1120   MCV 89.8 03/01/2021 0142   MCV 82.5 03/01/2016 1120   MCH 29.3 03/01/2021 0142   MCHC 32.6 03/01/2021 0142   RDW 16.3 (H) 03/01/2021 0142   RDW 16.3 (H) 03/01/2016 1120   LYMPHSABS 1.1 02/27/2021 1811   LYMPHSABS 1.8 03/01/2016 1120   MONOABS 0.6 02/27/2021 1811   MONOABS 0.4 03/01/2016 1120   EOSABS 0.1 02/27/2021 1811   EOSABS 0.1 03/01/2016 1120   BASOSABS 0.1 02/27/2021 1811   BASOSABS 0.0 03/01/2016 1120    BMET    Component Value Date/Time   NA 136 03/01/2021 0142   NA 141 03/01/2016 1120   K 4.3 03/01/2021 0142   K 3.6 03/01/2016 1120   CL 95 (L) 03/01/2021 0142   CO2 22 03/01/2021 0142   CO2 25 03/01/2016 1120   GLUCOSE 63 (L) 03/01/2021 0142   GLUCOSE 151 (H) 03/01/2016 1120   BUN 54 (H) 03/01/2021 0142   BUN 17.1 03/01/2016 1120   CREATININE 8.40 (H) 03/01/2021 0142   CREATININE 1.5 (H) 03/01/2016 1120   CALCIUM 8.9 03/01/2021 0142   CALCIUM 9.2 09/08/2019 0823   CALCIUM 9.2 03/01/2016 1120   GFRNONAA 6 (L) 03/01/2021 0142   GFRAA 5 (L) 11/16/2019 0252    INR    Component Value Date/Time   INR 1.1 03/01/2021 0142     Intake/Output Summary (Last 24 hours) at 03/01/2021 1117 Last data filed at 03/01/2021 0200 Gross per 24 hour  Intake 364.32 ml  Output --  Net 364.32 ml      Assessment/plan:  80 y.o. male is here with rest pain right lower extremity underwent angiography yesterday demonstrated occluded right SFA stenting.  Plan will be for right common femoral endarterectomy with possible right femoral to popliteal artery bypass today with vein.  I discussed the risk benefits and alternatives with the patient he demonstrates good understanding and understands that this is a limb threatening situation.   Twania Bujak C. Donzetta Matters, MD [facility] [place] Office: [phone] Pager: [phone]  03/01/2021 11:17 AM

## 2021-03-01 NOTE — Op Note (Signed)
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 Donzetta Matters, MD Assistant: Laurence Slate, PA Procedure Performed: 1.  Harvest right greater saphenous vein 2.  Right external iliac artery, right common femoral, right profunda femoral and right superficial femoral artery endarterectomy with vein patch angioplasty  Indications:  80yo male with end-stage renal disease.  He now has limb threatening ischemia with rest pain.  He is indicated for right common femoral endarterectomy possible bypass.  Assistant was necessary to expedite the case.  Findings: Common femoral artery was heavily calcified there was previously a dissection plane was identified.  There were occluded stents leading into the SFA.  All stents were removed.  The profunda femoris was subtotally occluded this was endarterectomized.  The external leg artery was heavily calcified was doubly clamped I was able to perform endarterectomy into the external leg artery and establish very strong inflow.  There was a large saphenous vein was harvested and this was used as a patch angioplasty.  At completion there was very strong signal in the profunda femoris artery.   Procedure:  The patient was identified in the holding area and taken to the operating room where is placed supine on upper table general anesthesia induced.  He was sterilely prepped draped in the right groin and right lower extremity usual fashion, antibiotics were ministered, was called.  Ultrasound was used to identify the saphenous vein throughout his right lower extremity.  We will ultrasound to identify the common femoral artery as well made a transverse incision.  We dissected down through skin subcutaneous tissue to the common femoral artery.  This was heavily calcified and densely adherent to the surrounding tissue.  We  dissected up onto the inguinal ligament divided the crossing vein.  The external leg artery was heavily calcified was going to be difficult for clamping.  We dissected down to the profunda and the SFA and placed Vesseloops around all these and patient was fully heparinized.  We then clamped the outflow and doubly clamped the inflow and the external iliac artery.  We opened longitudinally with 11 blade and extended with Potts scissors.  We identified at least 2 stents that were fully occluded.  There was no backbleeding from the profunda.  We performed extensive endarterectomy including up onto the inguinal ligament into the external leg artery until there was an area saw for clamping and then moved to 1 clamp.  We establish good backbleeding from the profunda.  We dissected out although the stents into the SFA.  We did not establish any backbleeding but did endarterectomized for approximately 5 cm down the SFA.  This was clamped.  Do the same incision we harvested the greater saphenous vein.  Distally we triply clamped it and divided it.  We then dissected back to the saphenofemoral junction and clamped it and divided it.  We oversewed with 5-0 Prolene suture in a running mattress fashion.  We opened this longitudinally flushed with heparinized saline reversal inserted and placed as a patch angioplasty with 5-0 Prolene suture.  Prior completion without flushing all directions.  We did have to place a few repair stitches at completion.  We then had very strong signal in the profunda there was high resistance outflow signal into the SFA but there was a very strong common femoral pulse previously was very weak.  Satisfied with this we administered 50 mg  of protamine.  We thoroughly irrigated wound obtain hemostasis and closed in layers of Vicryl and Monocryl.  Dermabond is placed to level the skin.  He was awakened from anesthesia having tolerated procedure without any complication all counts were correct at  completion.  EBL: 250cc   Gerica Koble C. Donzetta Matters, MD [facility] Office: [phone] Pager: [phone]

## 2021-03-01 NOTE — Progress Notes (Signed)
I consulted with Dr. Johnney Ou, on-call nephrologist, about blood pressure medicine schedule tonight. Plan to go to HD tonight at 01:00 am per HD RN [facility]. Pt has had high BP 170/49-210/61 mmHg since post-op. Labetalol given x 1 was not effective Dr. Johnney Ou recommended that Pt can have Norvasc, Hydralazine and Catapres at this time.   Pt is alert, oriented x 4. Right leg incision has no drainage, no hematoma noted. Pain tolerated well. Good signals on Right and left DP and PT via doppler.   Kennyth Lose, RN

## 2021-03-01 NOTE — Transfer of Care (Signed)
Immediate Anesthesia Transfer of Care Note  Patient: Michael Blackwell  Procedure(s) Performed: ENDARTERECTOMY FEMORAL (Leg Upper) PATCH ANGIOPLASTY USING RIGHT GREATER SAPHENOUS VEIN (Right Leg Upper)  Patient Location: PACU  Anesthesia Type:General  Level of Consciousness: drowsy  Airway & Oxygen Therapy: Patient Spontanous Breathing  Post-op Assessment: Report given to RN and Post -op Vital signs reviewed and stable  Post vital signs: Reviewed and stable  Last Vitals:  Vitals Value Taken Time  BP    Temp    Pulse 84 03/01/21 1517  Resp 25 03/01/21 1517  SpO2 100 % 03/01/21 1517  Vitals shown include unvalidated device data.  Last Pain:  Vitals:   03/01/21 1125  TempSrc: Oral  PainSc:          Complications: No complications documented.

## 2021-03-02 ENCOUNTER — Encounter (HOSPITAL_COMMUNITY): Payer: Self-pay | Admitting: Vascular Surgery

## 2021-03-02 ENCOUNTER — Encounter (HOSPITAL_COMMUNITY): Payer: Self-pay

## 2021-03-02 ENCOUNTER — Other Ambulatory Visit (HOSPITAL_COMMUNITY): Payer: Self-pay

## 2021-03-02 DIAGNOSIS — I998 Other disorder of circulatory system: Secondary | ICD-10-CM | POA: Diagnosis not present

## 2021-03-02 LAB — BASIC METABOLIC PANEL
Anion gap: 11 (ref 5–15)
BUN: 21 mg/dL (ref 8–23)
CO2: 29 mmol/L (ref 22–32)
Calcium: 8.8 mg/dL — ABNORMAL LOW (ref 8.9–10.3)
Chloride: 97 mmol/L — ABNORMAL LOW (ref 98–111)
Creatinine, Ser: 4.59 mg/dL — ABNORMAL HIGH (ref 0.61–1.24)
GFR, Estimated: 12 mL/min — ABNORMAL LOW (ref >60)
Glucose, Bld: 83 mg/dL (ref 70–99)
Potassium: 3.4 mmol/L — ABNORMAL LOW (ref 3.5–5.1)
Sodium: 137 mmol/L (ref 135–145)

## 2021-03-02 LAB — CBC
HCT: 29.5 % — ABNORMAL LOW (ref 39.0–52.0)
Hemoglobin: 9.7 g/dL — ABNORMAL LOW (ref 13.0–17.0)
MCH: 29.6 pg (ref 26.0–34.0)
MCHC: 32.9 g/dL (ref 30.0–36.0)
MCV: 89.9 fL (ref 80.0–100.0)
Platelets: 155 10*3/uL (ref 150–400)
RBC: 3.28 MIL/uL — ABNORMAL LOW (ref 4.22–5.81)
RDW: 16.3 % — ABNORMAL HIGH (ref 11.5–15.5)
WBC: 7.9 10*3/uL (ref 4.0–10.5)
nRBC: 0 % (ref 0.0–0.2)

## 2021-03-02 LAB — LIPID PANEL
Cholesterol: 177 mg/dL (ref 0–200)
HDL: 70 mg/dL (ref >40)
LDL Cholesterol: 90 mg/dL (ref 0–99)
Total CHOL/HDL Ratio: 2.5 RATIO
Triglycerides: 83 mg/dL (ref <150)
VLDL: 17 mg/dL (ref 0–40)

## 2021-03-02 LAB — GLUCOSE, CAPILLARY
Glucose-Capillary: 107 mg/dL — ABNORMAL HIGH (ref 70–99)
Glucose-Capillary: 143 mg/dL — ABNORMAL HIGH (ref 70–99)

## 2021-03-02 LAB — HEPATITIS B CORE ANTIBODY, TOTAL: Hep B Core Total Ab: NONREACTIVE

## 2021-03-02 LAB — HEPATITIS B SURFACE ANTIGEN: Hepatitis B Surface Ag: NONREACTIVE

## 2021-03-02 MED ORDER — ACETAMINOPHEN 325 MG PO TABS: 325.0000 mg | ORAL_TABLET | ORAL | 0 refills | Status: AC | PRN

## 2021-03-02 MED ORDER — OXYCODONE-ACETAMINOPHEN 5-325 MG PO TABS
2.0000 | ORAL_TABLET | Freq: Four times a day (QID) | ORAL | 0 refills | Status: AC | PRN
  Filled 2021-03-02: qty 20, 3d supply, fill #0

## 2021-03-02 NOTE — Discharge Summary (Signed)
Physician Discharge Summary  KEVANTE LUNT QAS:[id number] DOB: 09/24/1941 DOA: 02/27/2021  PCP: Rogers Blocker, MD  Admit date: 02/27/2021 Discharge date: 03/02/2021  Admitted From: Home Disposition: Home  Recommendations for Outpatient Follow-up:  1. Follow up with PCP in 1-2 weeks 2. Please obtain BMP/CBC in one week 3. Please follow up with vascular surgery as scheduled  Home Health: Outpatient PT Equipment/Devices: None  Discharge Condition: Stable CODE STATUS: Full Diet recommendation: Low-salt low-fat diabetic diet  Brief/Interim Summary: Patient is a 80 year old African-American male with past medical history significant for end-stage renal disease on hemodialysis on Monday, Wednesday and Friday; PAD, hypertension, hyperlipidemia, diet-controlled type 2 diabetes, OSA on CPAP, history of prostate cancer. Patient was admitted with about 77-month history ofright foot pain.   Patient underwent aortogram with profound bilateral disease, revascularization of right leg via vascular surgery on 03/01/2021, tolerated procedure quite well, sepsis previously suspected but ruled out given negative procalcitonin and no clear source of infection.  Patient otherwise stable and agreeable for discharge home, pain currently well controlled, outpatient PT follow-up per PT recommendations.  Patient to follow-up with vascular surgery as scheduled.  No medication changes during hospitalization other than pain medications at discharge postoperatively.  Follow-up with PCP in 1 to 2 weeks, nephrology as scheduled and continue dialysis Tuesday Thursday Saturdays as previously scheduled.    Discharge Diagnoses:  Principal Problem:   Lower limb ischemia Active Problems:   Type 2 diabetes mellitus with renal manifestations (HCC)   Essential hypertension, malignant   OSA on CPAP   Dependence on renal dialysis [facility])    Discharge Instructions  Discharge Instructions    Call MD for:  redness, tenderness,  or signs of infection (pain, swelling, redness, odor or green/yellow discharge around incision site)   Complete by: As directed    Call MD for:  severe uncontrolled pain   Complete by: As directed    Call MD for:  temperature >100.4   Complete by: As directed    Diet - low sodium heart healthy   Complete by: As directed    Discharge wound care:   Complete by: As directed    Per vascular   Increase activity slowly   Complete by: As directed      Allergies as of 03/02/2021   No Known Allergies     Medication List    TAKE these medications   acetaminophen 325 MG tablet Commonly known as: TYLENOL Take 1-2 tablets (325-650 mg total) by mouth every 4 (four) hours as needed for mild pain (or temp >/= 101 F).   amLODipine 10 MG tablet Commonly known as: NORVASC Take 5 mg by mouth at bedtime.   aspirin EC 81 MG tablet Take 81 mg by mouth at bedtime.   B-complex with vitamin C tablet Take 1 tablet by mouth daily.   Calcium Carb-Cholecalciferol 600-800 MG-UNIT Tabs Take 1 tablet by mouth daily.   calcium carbonate 500 MG chewable tablet Commonly known as: TUMS - dosed in mg elemental calcium Chew 1 tablet by mouth 3 (three) times daily after meals.   cloNIDine 0.3 MG tablet Commonly known as: CATAPRES Take 0.6 mg by mouth 2 (two) times daily.   clopidogrel 75 MG tablet Commonly known as: Plavix Take 1 tablet (75 mg total) by mouth daily.   gabapentin 100 MG capsule Commonly known as: NEURONTIN Take 1 capsule (100 mg total) by mouth 3 (three) times daily.   hydrALAZINE 100 MG tablet Commonly known as: APRESOLINE Take 100 mg  by mouth 3 (three) times daily.   isosorbide mononitrate 30 MG 24 hr tablet Commonly known as: IMDUR Take 30 mg by mouth every evening.   lidocaine-prilocaine cream Commonly known as: EMLA Apply 1 application topically daily as needed (prior to port being accessed).   OMEGA 3 PO Take 1,040 mg by mouth daily.   oxyCODONE-acetaminophen 5-325  MG tablet Commonly known as: PERCOCET/ROXICET Take 2 tablets by mouth every 6 (six) hours as needed for moderate pain.   polyethylene glycol 17 g packet Commonly known as: MIRALAX / GLYCOLAX Take 17 g by mouth daily as needed (constipation.).   rosuvastatin 10 MG tablet Commonly known as: CRESTOR Take 1 tablet (10 mg total) by mouth daily.   tamsulosin 0.4 MG Caps capsule Commonly known as: FLOMAX Take 1 capsule (0.4 mg total) by mouth every other day. What changed: when to take this   vitamin E 180 MG (400 UNITS) capsule Take 400 Units by mouth daily.            Discharge Care Instructions  (From admission, onward)         Start     Ordered   03/02/21 0000  Discharge wound care:       Comments: Per vascular   03/02/21 1419          Follow-up Information    [name], MD Follow up in 3 week(s).   Specialties: Vascular Surgery, Cardiology Contact information: [address] [place] [address] [phone]              No Known Allergies  Consultations:  Vascular surgery, nephrology   Procedures/Studies: PERIPHERAL VASCULAR CATHETERIZATION  Result Date: 02/28/2021 Patient name: DEVERE BREM MRN: [id number] DOB: 1941/01/26 Sex: male 02/27/2021 - 02/28/2021 Pre-operative Diagnosis: Right leg rest pain Post-operative diagnosis:  Same Surgeon:  Annamarie Major Procedure Performed:  1.  Ultrasound-guided access, left femoral artery  2.  Abdominal aortogram  3.  Bilateral lower extremity runoff  4.  Second-order catheterization  5.  Conscious sedation, 47 minutes Indications: This is a 80 year old gentleman who has previously undergone intervention in his right leg.  He presented to the hospital with several day history of worsening pain.  He is here for further evaluation. Procedure:  The patient was identified in the holding area and taken to room 8.  The patient was then placed supine on the table and prepped and draped in the usual sterile  fashion.  A time out was called.  Conscious sedation was administered with the use of IV fentanyl and Versed under continuous physician and nurse monitoring.  Heart rate, blood pressure, and oxygen saturation were continuously monitored.  Total sedation time was 64minutes.  Ultrasound was used to evaluate the left common femoral artery.  It was patent .  A digital ultrasound image was acquired.  A micropuncture needle was used to access the left common femoral artery under ultrasound guidance.  An 018 wire was advanced without resistance and a micropuncture sheath was placed.  The 018 wire was removed and a benson wire was placed.  The micropuncture sheath was exchanged for a 5 french sheath.  An omniflush catheter was advanced over the wire to the level of L-1.  An abdominal angiogram was obtained.  Next, using the omniflush catheter and a benson wire, the aortic bifurcation was crossed and the catheter was placed into theright external iliac artery and right runoff was obtained.  left runoff was performed via retrograde sheath injections. Findings:  Aortogram: No significant renal artery stenosis was identified.  The infrarenal abdominal aorta is heavily calcified but without significant stenosis.  Bilateral common and external iliac arteries are heavily calcified but patent without hemodynamically significant stenosis.  Right Lower Extremity: Approximate 50 to 60% stenosis within the right common femoral artery.  There is a stent extending into the common femoral artery.  The stent occludes at the origin of the superficial femoral artery.  There is reconstitution of the diseased above-knee popliteal artery.  The below-knee popliteal artery is patent with two-vessel runoff via the posterior tibial and peroneal artery.  There is ostial stenosis at the origin of the profundofemoral artery  Left Lower Extremity: Severely diseased common femoral artery and proximal profundofemoral artery.  The superficial femoral  artery is occluded with reconstitution at the knee.  There is single-vessel runoff via the peroneal artery Intervention: None Impression:  #1  Occluded right superficial femoral artery with ostial profunda stenosis.  There is reconstitution of a diseased above-knee popliteal artery.  The below-knee popliteal artery is without stenosis.  There is two-vessel runoff  #2  Due to the stent within the common femoral artery and profunda stenosis, surgical revascularization would be preferred.  #3 severely diseased left common femoral artery with occluded superficial femoral artery and single-vessel runoff via the peroneal artery  V. Annamarie Major, M.D., [facility] [place] Office: [phone] Pager:  [phone]  DG Chest Port 1 View  Result Date: 02/27/2021 CLINICAL DATA:  Possible sepsis EXAM: PORTABLE CHEST 1 VIEW COMPARISON:  11/16/2019 FINDINGS: Cardiac shadow is stable. Lungs are clear. No bony abnormality is noted. IMPRESSION: No acute abnormality noted. Electronically Signed   By: Inez Catalina M.D.   On: 02/27/2021 19:20   VAS [place] LOWER EXTREMITY SAPHENOUS VEIN MAPPING  Result Date: 02/28/2021 LOWER EXTREMITY VEIN MAPPING Patient Name:  LENELL LAMA  Date of Exam:   02/28/2021 Medical Rec #: [id number]        Accession #:    [id number] Date of Birth: Apr 18, 1941        Patient Gender: M Patient Age:   79Y Exam Location:  [facility] Procedure:      VAS [place] LOWER EXTREMITY SAPHENOUS VEIN MAPPING Referring Phys: [id number] Serafina Mitchell --------------------------------------------------------------------------------  Indications: Pre-op  Comparison Study: No previous exams Performing Technologist: Rogelia Rohrer  Examination Guidelines: A complete evaluation includes B-mode imaging, spectral Doppler, color Doppler, and power Doppler as needed of all accessible portions of each vessel. Bilateral testing is considered an integral part of a complete examination. Limited examinations  for reoccurring indications may be performed as noted. +--------------+-----------+-------------------+--------------+----------------+  RT Diameter  RT Findings        GSV         LT Diameter    LT Findings         (cm)                                        (cm)                      +--------------+-----------+-------------------+--------------+----------------+      0.63                  Saphenofemoral                  not visualized  Junction                      and Bandage                                                                  present      +--------------+-----------+-------------------+--------------+----------------+      0.48      branching   Proximal thigh        0.45                      +--------------+-----------+-------------------+--------------+----------------+      0.38                     Mid thigh          0.32                      +--------------+-----------+-------------------+--------------+----------------+      0.28      branching    Distal thigh         0.24                      +--------------+-----------+-------------------+--------------+----------------+      0.32                       Knee             0.29                      +--------------+-----------+-------------------+--------------+----------------+      0.25      branching      Prox calf          0.25        branching     +--------------+-----------+-------------------+--------------+----------------+      0.32                     Mid calf           0.18        branching     +--------------+-----------+-------------------+--------------+----------------+      0.32                    Distal calf         0.17                      +--------------+-----------+-------------------+--------------+----------------+      0.31                       Ankle            0.19                       +--------------+-----------+-------------------+--------------+----------------+ Diagnosing physician: Deitra Mayo MD Electronically signed by Deitra Mayo MD on 02/28/2021 at 6:43:20 PM.    Final      Subjective: No acute issues or events overnight, pain well controlled, ambulating without difficulty requesting discharge home which is certainly reasonable given his rapid improvement   Discharge Exam: Vitals:   03/02/21 0609 03/02/21 0804  BP: (!) 136/54 109/88  Pulse: 86 100  Resp: 17 18  Temp: 98.5 F (36.9 C) 98.3 F (36.8 C)  SpO2: 97% 98%   Vitals:   03/02/21 0400 03/02/21 0425 03/02/21 0609 03/02/21 0804  BP: 122/68 (!) 120/47 (!) 136/54 109/88  Pulse: 90 96 86 100  Resp: 18 18 17 18   Temp:  98.8 F (37.1 C) 98.5 F (36.9 C) 98.3 F (36.8 C)  TempSrc:  Oral Oral Oral  SpO2:   97% 98%  Weight:  79.9 kg    Height:        General: Pt is alert, awake, not in acute distress Cardiovascular: RRR, S1/S2 +, no rubs, no gallops Respiratory: CTA bilaterally, no wheezing, no rhonchi Abdominal: Soft, NT, ND, bowel sounds + Extremities: no edema, no cyanosis; RLE procedure site bandage clean dry intact without notable hematoma    The results of significant diagnostics from this hospitalization (including imaging, microbiology, ancillary and laboratory) are listed below for reference.     Microbiology: Recent Results (from the past 240 hour(s))  Resp Panel by RT-PCR (Flu A&B, Covid) Nasopharyngeal Swab     Status: None   Collection Time: 02/27/21  6:11 PM   Specimen: Nasopharyngeal Swab; Nasopharyngeal(NP) swabs in vial transport medium  Result Value Ref Range Status   SARS Coronavirus 2 by RT PCR NEGATIVE NEGATIVE Final    Comment: (NOTE) SARS-CoV-2 target nucleic acids are NOT DETECTED.  The SARS-CoV-2 RNA is generally detectable in upper respiratory specimens during the acute phase of infection. The lowest concentration of SARS-CoV-2 viral copies this  assay can detect is 138 copies/mL. A negative result does not preclude SARS-Cov-2 infection and should not be used as the sole basis for treatment or other patient management decisions. A negative result may occur with  improper specimen collection/handling, submission of specimen other than nasopharyngeal swab, presence of viral mutation(s) within the areas targeted by this assay, and inadequate number of viral copies(<138 copies/mL). A negative result must be combined with clinical observations, patient history, and epidemiological information. The expected result is Negative.  Fact Sheet for Patients:  EntrepreneurPulse.com.au  Fact Sheet for Healthcare Providers:  IncredibleEmployment.be  This test is no t yet approved or cleared by the [place] FDA and  has been authorized for detection and/or diagnosis of SARS-CoV-2 by FDA under an Emergency Use Authorization (EUA). This EUA will remain  in effect (meaning this test can be used) for the duration of the COVID-19 declaration under Section 564(b)(1) of the Act, 21 U.S.C.section 360bbb-3(b)(1), unless the authorization is terminated  or revoked sooner.       Influenza A by PCR NEGATIVE NEGATIVE Final   Influenza B by PCR NEGATIVE NEGATIVE Final    Comment: (NOTE) The Xpert Xpress SARS-CoV-2/FLU/RSV plus assay is intended as an aid in the diagnosis of influenza from Nasopharyngeal swab specimens and should not be used as a sole basis for treatment. Nasal washings and aspirates are unacceptable for Xpert Xpress SARS-CoV-2/FLU/RSV testing.  Fact Sheet for Patients: EntrepreneurPulse.com.au  Fact Sheet for Healthcare Providers: IncredibleEmployment.be  This test is not yet approved or cleared by the [place] FDA and has been authorized for detection and/or diagnosis of SARS-CoV-2 by FDA under an Emergency Use Authorization (EUA). This EUA will  remain in effect (meaning this test can be used) for the duration of the COVID-19 declaration under Section 564(b)(1) of the Act, 21 U.S.C. section 360bbb-3(b)(1), unless the authorization is terminated or revoked.  Performed at [facility], [place] [address] [place], [place] [address]  Blood Culture (routine x 2)     Status: None (Preliminary result)   Collection Time: 02/27/21  6:16 PM   Specimen: BLOOD LEFT HAND  Result Value Ref Range Status   Specimen Description BLOOD LEFT HAND  Final   Special Requests   Final    BOTTLES DRAWN AEROBIC AND ANAEROBIC Blood Culture results may not be optimal due to an inadequate volume of blood received in culture bottles   Culture   Final    NO GROWTH 3 DAYS Performed at [facility], [place] [address], [place], [place] [address]    Report Status PENDING  Incomplete  Blood Culture (routine x 2)     Status: None (Preliminary result)   Collection Time: 02/27/21  6:18 PM   Specimen: BLOOD  Result Value Ref Range Status   Specimen Description BLOOD LEFT ANTECUBITAL  Final   Special Requests   Final    BOTTLES DRAWN AEROBIC AND ANAEROBIC Blood Culture adequate volume   Culture   Final    NO GROWTH 3 DAYS Performed at [facility], [place] [address] [place], [place] [address]    Report Status PENDING  Incomplete  Surgical PCR screen     Status: None   Collection Time: 02/28/21  4:46 AM   Specimen: Nasal Mucosa; Nasal Swab  Result Value Ref Range Status   MRSA, PCR NEGATIVE NEGATIVE Final   Staphylococcus aureus NEGATIVE NEGATIVE Final    Comment: (NOTE) The Xpert SA Assay (FDA approved for NASAL specimens in patients 43 years of age and older), is one component of a comprehensive surveillance program. It is not intended to diagnose infection nor to guide or monitor treatment. Performed at [facility], [place] [address] [place], [place] [address]      Labs: BNP (last 3 results) No results for input(s): BNP in the  last 8760 hours. Basic Metabolic Panel: Recent Labs  Lab 02/27/21 1811 03/01/21 0142 03/01/21 1908 03/02/21 0646  NA 136 136  --  137  K 4.0 4.3  --  3.4*  CL 90* 95*  --  97*  CO2 31 22  --  29  GLUCOSE 119* 63*  --  83  BUN 32* 54*  --  21  CREATININE 6.10* 8.40* 8.91* 4.59*  CALCIUM 9.0 8.9  --  8.8*   Liver Function Tests: Recent Labs  Lab 02/27/21 1811  AST 17  ALT 13  ALKPHOS 68  BILITOT 0.5  PROT 7.8  ALBUMIN 3.8   No results for input(s): LIPASE, AMYLASE in the last 168 hours. No results for input(s): AMMONIA in the last 168 hours. CBC: Recent Labs  Lab 02/27/21 1811 03/01/21 0142 03/01/21 1908 03/02/21 0646  WBC 7.6 6.5 7.9 7.9  NEUTROABS 5.7  --   --   --   HGB 12.4* 11.5* 10.8* 9.7*  HCT 38.8* 35.3* 33.5* 29.5*  MCV 91.5 89.8 91.3 89.9  PLT 199 186 172 155   Cardiac Enzymes: No results for input(s): CKTOTAL, CKMB, CKMBINDEX, TROPONINI in the last 168 hours. BNP: Invalid input(s): POCBNP CBG: Recent Labs  Lab 03/01/21 1518 03/01/21 1824 03/01/21 2044 03/02/21 0619 03/02/21 1202  GLUCAP 103* 153* 247* 107* 143*   D-Dimer No results for input(s): DDIMER in the last 72 hours. Hgb A1c Recent Labs    02/27/21 2120  HGBA1C 5.7*   Lipid Profile Recent Labs    03/02/21 0646  CHOL 177  HDL 70  LDLCALC 90  TRIG 83  CHOLHDL 2.5   Thyroid function studies No results for input(s): TSH, T4TOTAL, T3FREE, THYROIDAB in the last 72 hours.  Invalid input(s): FREET3 Anemia work up No results for input(s): VITAMINB12, FOLATE, FERRITIN, TIBC, IRON, RETICCTPCT in the last 72 hours. Urinalysis No results found for: COLORURINE, APPEARANCEUR, [place], [place], GLUCOSEU, [place], [place], [place], PROTEINUR, UROBILINOGEN, NITRITE, LEUKOCYTESUR Sepsis Labs Invalid input(s): PROCALCITONIN,  WBC,  LACTICIDVEN Microbiology Recent Results (from the past 240 hour(s))  Resp Panel by RT-PCR (Flu A&B, Covid) Nasopharyngeal Swab     Status: None    Collection Time: 02/27/21  6:11 PM   Specimen: Nasopharyngeal Swab; Nasopharyngeal(NP) swabs in vial transport medium  Result Value Ref Range Status   SARS Coronavirus 2 by RT PCR NEGATIVE NEGATIVE Final    Comment: (NOTE) SARS-CoV-2 target nucleic acids are NOT DETECTED.  The SARS-CoV-2 RNA is generally detectable in upper respiratory specimens during the acute phase of infection. The lowest concentration of SARS-CoV-2 viral copies this assay can detect is 138 copies/mL. A negative result does not preclude SARS-Cov-2 infection and should not be used as the sole basis for treatment or other patient management decisions. A negative result may occur with  improper specimen collection/handling, submission of specimen other than nasopharyngeal swab, presence of viral mutation(s) within the areas targeted by this assay, and inadequate number of viral copies(<138 copies/mL). A negative result must be combined with clinical observations, patient history, and epidemiological information. The expected result is Negative.  Fact Sheet for Patients:  EntrepreneurPulse.com.au  Fact Sheet for Healthcare Providers:  IncredibleEmployment.be  This test is no t yet approved or cleared by the [place] FDA and  has been authorized for detection and/or diagnosis of SARS-CoV-2 by FDA under an Emergency Use Authorization (EUA). This EUA will remain  in effect (meaning this test can be used) for the duration of the COVID-19 declaration under Section 564(b)(1) of the Act, 21 U.S.C.section 360bbb-3(b)(1), unless the authorization is terminated  or revoked sooner.       Influenza A by PCR NEGATIVE NEGATIVE Final   Influenza B by PCR NEGATIVE NEGATIVE Final    Comment: (NOTE) The Xpert Xpress SARS-CoV-2/FLU/RSV plus assay is intended as an aid in the diagnosis of influenza from Nasopharyngeal swab specimens and should not be used as a sole basis for treatment.  Nasal washings and aspirates are unacceptable for Xpert Xpress SARS-CoV-2/FLU/RSV testing.  Fact Sheet for Patients: EntrepreneurPulse.com.au  Fact Sheet for Healthcare Providers: IncredibleEmployment.be  This test is not yet approved or cleared by the [place] FDA and has been authorized for detection and/or diagnosis of SARS-CoV-2 by FDA under an Emergency Use Authorization (EUA). This EUA will remain in effect (meaning this test can be used) for the duration of the COVID-19 declaration under Section 564(b)(1) of the Act, 21 U.S.C. section 360bbb-3(b)(1), unless the authorization is terminated or revoked.  Performed at [facility], [place] [address] [place], [place] [address]   Blood Culture (routine x 2)     Status: None (Preliminary result)   Collection Time: 02/27/21  6:16 PM   Specimen: BLOOD LEFT HAND  Result Value Ref Range Status   Specimen Description BLOOD LEFT HAND  Final   Special Requests   Final    BOTTLES DRAWN AEROBIC AND ANAEROBIC Blood Culture results may not be optimal due to an inadequate volume of blood received in culture bottles   Culture   Final    NO GROWTH 3 DAYS Performed at [facility], [place]  [address], [place], [place] [address]    Report Status PENDING  Incomplete  Blood Culture (routine x 2)     Status: None (Preliminary result)   Collection Time: 02/27/21  6:18 PM   Specimen: BLOOD  Result Value Ref Range Status   Specimen Description BLOOD LEFT ANTECUBITAL  Final   Special Requests   Final    BOTTLES DRAWN AEROBIC AND ANAEROBIC Blood Culture adequate volume   Culture   Final    NO GROWTH 3 DAYS Performed at [facility], [place] [address] [place], [place] [address]    Report Status PENDING  Incomplete  Surgical PCR screen     Status: None   Collection Time: 02/28/21  4:46 AM   Specimen: Nasal Mucosa; Nasal Swab  Result Value Ref Range Status   MRSA, PCR NEGATIVE NEGATIVE Final    Staphylococcus aureus NEGATIVE NEGATIVE Final    Comment: (NOTE) The Xpert SA Assay (FDA approved for NASAL specimens in patients 78 years of age and older), is one component of a comprehensive surveillance program. It is not intended to diagnose infection nor to guide or monitor treatment. Performed at [facility], [place] [address] [place], [place] [address]      Time coordinating discharge: Over 30 minutes  SIGNED:   Little Ishikawa, DO [facility] 03/02/2021, 2:23 PM Pager   If 7PM-7AM, please contact night-coverage [email or web address]

## 2021-03-02 NOTE — Discharge Instructions (Signed)
 [facility]  Discharge instructions  Lower Extremity Bypass Surgery  Please refer to the following instruction for your post-procedure care. Your surgeon or physician assistant will discuss any changes with you.  Activity  You are encouraged to walk as much as you can. You can slowly return to normal activities during the month after your surgery. Avoid strenuous activity and heavy lifting until your doctor tells you it's OK. Avoid activities such as vacuuming or swinging a golf club. Do not drive until your doctor give the OK and you are no longer taking prescription pain medications. It is also normal to have difficulty with sleep habits, eating and bowel movement after surgery. These will go away with time.  Bathing/Showering  You may shower after you go home. Do not soak in a bathtub, hot tub, or swim until the incision heals completely.  Incision Care  Clean your incision with mild soap and water. Shower every day. Pat the area dry with a clean towel. You do not need a bandage unless otherwise instructed. Do not apply any ointments or creams to your incision. If you have open wounds you will be instructed how to care for them or a visiting nurse may be arranged for you. If you have staples or sutures along your incision they will be removed at your post-op appointment. You may have skin glue on your incision. Do not peel it off. It will come off on its own in about one week. If you have a great deal of moisture in your groin, use a gauze help keep this area dry.  Diet  Resume your normal diet. There are no special food restrictions following this procedure. A low fat/ low cholesterol diet is recommended for all patients with vascular disease. In order to heal from your surgery, it is CRITICAL to get adequate nutrition. Your body requires vitamins, minerals, and protein. Vegetables are the best source of vitamins and minerals. Vegetables also provide the  perfect balance of protein. Processed food has little nutritional value, so try to avoid this.  Medications  Resume taking all your medications unless your doctor or nurse practitioner tells you not to. If your incision is causing pain, you may take over-the-counter pain relievers such as acetaminophen (Tylenol). If you were prescribed a stronger pain medication, please aware these medication can cause nausea and constipation. Prevent nausea by taking the medication with a snack or meal. Avoid constipation by drinking plenty of fluids and eating foods with high amount of fiber, such as fruits, vegetables, and grains. Take Colase 100 mg (an over-the-counter stool softener) twice a day as needed for constipation. Do not take Tylenol if you are taking prescription pain medications.  Follow Up  Our office will schedule a follow up appointment 2-3 weeks following discharge.  Please call us immediately for any of the following conditions  Severe or worsening pain in your legs or feet while at rest or while walking Increase pain, redness, warmth, or drainage (pus) from your incision site(s) Fever of 101 degree or higher The swelling in your leg with the bypass suddenly worsens and becomes more painful than when you were in the hospital If you have been instructed to feel your graft pulse then you should do so every day. If you can no longer feel this pulse, call the office immediately. Not all patients are given this instruction.  Leg swelling is common after leg bypass surgery.  The swelling should improve over a few months   following surgery. To improve the swelling, you may elevate your legs above the level of your heart while you are sitting or resting. Your surgeon or physician assistant may ask you to apply an ACE wrap or wear compression (TED) stockings to help to reduce swelling.  Reduce your risk of vascular disease  Stop smoking. If you would like help call [facility] at 1-800-QUIT-NOW  (1-800-784-8669) or [place] at 336-586-4000.  Manage your cholesterol Maintain a desired weight Control your diabetes weight Control your diabetes Keep your blood pressure down  If you have any questions, please call the office at [phone]   

## 2021-03-02 NOTE — Progress Notes (Signed)
Pt discharging home with wife.  All medications and instructions reviewed, all questions answered.

## 2021-03-02 NOTE — Progress Notes (Signed)
PHARMACIST LIPID MONITORING   Michael Blackwell is a 80 y.o. male admitted on 02/27/2021 with foot pain, now s/p R CFA endarterectomy.  Pharmacy has been consulted to optimize lipid-lowering therapy with the indication of secondary prevention for clinical ASCVD.  Recent Labs:  Lipid Panel (last 6 months):   Lab Results  Component Value Date   CHOL 177 03/02/2021   TRIG 83 03/02/2021   HDL 70 03/02/2021   CHOLHDL 2.5 03/02/2021   VLDL 17 03/02/2021   LDLCALC 90 03/02/2021    Hepatic function panel (last 6 months):   Lab Results  Component Value Date   AST 17 02/27/2021   ALT 13 02/27/2021   ALKPHOS 68 02/27/2021   BILITOT 0.5 02/27/2021    SCr (since admission):   Serum creatinine: 4.59 mg/dL (H) 03/02/21 0646 Estimated creatinine clearance: 13 mL/min (A)  Current therapy and lipid therapy tolerance Current lipid-lowering therapy: crestor 10 mg Previous lipid-lowering therapies (if applicable): Documented or reported allergies or intolerances to lipid-lowering therapies (if applicable): none  Assessment:   Patient is excluded from the protocol due to ESRD (ESRD, elevated LFTs, pregnancy/breastfeeding, active liver disease)  Plan:    1.Statin intensity (high intensity recommended for all patients regardless of the LDL):  No statin changes. The patient is already on a high intensity statin.  2.Add ezetimibe (if any one of the following):   Not indicated at this time.  3.Refer to lipid clinic:   No  4.Follow-up with:  Primary care provider - Rogers Blocker, MD  5.Follow-up labs after discharge:  No changes in lipid therapy, repeat a lipid panel in one year.      [place] Crane, Vena [place], BCPS, BCCP Clinical Pharmacist  03/02/2021 1:25 PM   [facility] pharmacy phone numbers are listed on [email or web address]

## 2021-03-02 NOTE — Progress Notes (Addendum)
  Progress Note    03/02/2021 7:23 AM 1 Day Post-Op  Subjective:  No rest pain overnight.  Foot feels better compared to before surgery.  Wants to go home today   Vitals:   03/02/21 0425 03/02/21 0609  BP: (!) 120/47 (!) 136/54  Pulse: 96 86  Resp: 18 17  Temp: 98.8 F (37.1 C) 98.5 F (36.9 C)  SpO2:  97%   Physical Exam: Lungs:  Non labored Incisions:  R groin incision c/d/i Extremities:  R peroneal and PT by doppler Neurologic: a&O  CBC    Component Value Date/Time   WBC 7.9 03/02/2021 0646   RBC 3.28 (L) 03/02/2021 0646   HGB 9.7 (L) 03/02/2021 0646   HGB 10.5 (L) 03/01/2016 1120   HCT 29.5 (L) 03/02/2021 0646   HCT 32.5 (L) 03/01/2016 1120   PLT 155 03/02/2021 0646   PLT 254 03/01/2016 1120   MCV 89.9 03/02/2021 0646   MCV 82.5 03/01/2016 1120   MCH 29.6 03/02/2021 0646   MCHC 32.9 03/02/2021 0646   RDW 16.3 (H) 03/02/2021 0646   RDW 16.3 (H) 03/01/2016 1120   LYMPHSABS 1.1 02/27/2021 1811   LYMPHSABS 1.8 03/01/2016 1120   MONOABS 0.6 02/27/2021 1811   MONOABS 0.4 03/01/2016 1120   EOSABS 0.1 02/27/2021 1811   EOSABS 0.1 03/01/2016 1120   BASOSABS 0.1 02/27/2021 1811   BASOSABS 0.0 03/01/2016 1120    BMET    Component Value Date/Time   NA 136 03/01/2021 0142   NA 141 03/01/2016 1120   K 4.3 03/01/2021 0142   K 3.6 03/01/2016 1120   CL 95 (L) 03/01/2021 0142   CO2 22 03/01/2021 0142   CO2 25 03/01/2016 1120   GLUCOSE 63 (L) 03/01/2021 0142   GLUCOSE 151 (H) 03/01/2016 1120   BUN 54 (H) 03/01/2021 0142   BUN 17.1 03/01/2016 1120   CREATININE 8.91 (H) 03/01/2021 1908   CREATININE 1.5 (H) 03/01/2016 1120   CALCIUM 8.9 03/01/2021 0142   CALCIUM 9.2 09/08/2019 0823   CALCIUM 9.2 03/01/2016 1120   GFRNONAA 6 (L) 03/01/2021 1908   GFRAA 5 (L) 11/16/2019 0252    INR    Component Value Date/Time   INR 1.1 03/01/2021 0142     Intake/Output Summary (Last 24 hours) at 03/02/2021 0723 Last data filed at 03/02/2021 7564 Gross per 24 hour  Intake  1206.42 ml  Output 609 ml  Net 597.42 ml     Assessment/Plan:  80 y.o. male is s/p R CFA endarterectomy with vein patch 1 Day Post-Op   R foot well perfused; rest pain has resolved Incision healing well OOB this morning Probably d/c home this afternoon  Dagoberto Ligas, PA-C [facility] [phone] 03/02/2021 7:23 AM  I have independently interviewed and examined patient and agree with PA assessment and plan above. Signals much stronger in his right foot and rest pain has resolved. If he continues to have issues he would be candidate for R SFA stenting. I elected against fem-pop bypass given heavy calcium burden and subtotal occlusion of common femoral artery, the likely source of his issues.   Amaiya Scruton C. Donzetta Matters, MD [facility] Office: [phone] Pager: [phone]

## 2021-03-02 NOTE — Evaluation (Signed)
Physical Therapy Evaluation Patient Details Name: Michael Blackwell MRN: [id number] DOB: 1940-10-02 Today's Date: 03/02/2021   History of Present Illness  Pt is a 80 y.o. male who presented with pain in R foot. Consulted with vascular sugery and pt underwent abdominal aortogram w/ R lower extremity arteriogram. PMH: ESRD on HD, HTN, HLD, DM, and PAD.  Clinical Impression  Pt doing well with mobility and ready to go home from PT standpoint. 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 Recommend OPPT to assess for possible rt AFO.    Follow Up Recommendations Outpatient PT (for assessment for Rt AFO)    Equipment Recommendations  None recommended by PT    Recommendations for Other Services       Precautions / Restrictions Precautions Precautions: Fall Restrictions Weight Bearing Restrictions: No      Mobility  Bed Mobility Overal bed mobility: Independent                  Transfers Overall transfer level: Needs assistance Equipment used: Straight cane Transfers: Sit to/from Stand Sit to Stand: Supervision         General transfer comment: supervision for safety  Ambulation/Gait Ambulation/Gait assistance: Supervision Gait Distance (Feet): 250 Feet Assistive device: Straight cane Gait Pattern/deviations: Step-through pattern;Decreased stride length;Decreased dorsiflexion - right;Steppage Gait velocity: decr Gait velocity interpretation: 1.31 - 2.62 ft/sec, indicative of limited community ambulator General Gait Details: supervision for safety  Stairs            Wheelchair Mobility    Modified Rankin (Stroke Patients Only)       Balance Overall balance assessment: Needs assistance Sitting-balance support: No upper extremity supported;Feet supported Sitting balance-Leahy Scale: Good     Standing balance support: During functional activity;No upper extremity supported Standing balance-Leahy Scale:  Fair Standing balance comment: fair static standing at sink, use of cane for dynamic tasks                             Pertinent Vitals/Pain Pain Assessment: No/denies pain    Home Living Family/patient expects to be discharged to:: Private residence Living Arrangements: Spouse/significant other Available Help at Discharge: Family;Available 24 hours/day Type of Home: House Home Access: Stairs to enter Entrance Stairs-Rails: Right;Left Entrance Stairs-Number of Steps: 3 at front, 4 through garage Home Layout: Two level;Able to live on main level with bedroom/bathroom Home Equipment: Kasandra Knudsen - single point;Shower seat - built in;Grab bars - tub/shower;Grab bars - toilet;Hand held shower head      Prior Function Level of Independence: Independent with assistive device(s)         Comments: Use of SPC for mobility, denies any recent falls. Pt Modified Independent for ADLs, IADLs. Retired Medical illustrator.     Hand Dominance   Dominant Hand: Left    Extremity/Trunk Assessment   Upper Extremity Assessment Upper Extremity Assessment: Defer to OT evaluation    Lower Extremity Assessment Lower Extremity Assessment: RLE deficits/detail RLE Deficits / Details: Dorsiflexion 0/5    Cervical / Trunk Assessment Cervical / Trunk Assessment: Normal  Communication   Communication: No difficulties  Cognition Arousal/Alertness: Awake/alert Behavior During Therapy: WFL for tasks assessed/performed Overall Cognitive Status: Within Functional Limits for tasks assessed  General Comments General comments (skin integrity, edema, etc.): VSS on RA    Exercises     Assessment/Plan    PT Assessment All further PT needs can be met in the next venue of care  PT Problem List Decreased strength;Decreased balance;Decreased mobility       PT Treatment Interventions      PT Goals (Current goals can be found in the Care Plan  section)  Acute Rehab PT Goals Patient Stated Goal: go home today, walk in hall PT Goal Formulation: All assessment and education complete, DC therapy    Frequency     Barriers to discharge        Co-evaluation               AM-PAC PT "6 Clicks" Mobility  Outcome Measure Help needed turning from your back to your side while in a flat bed without using bedrails?: None Help needed moving from lying on your back to sitting on the side of a flat bed without using bedrails?: None Help needed moving to and from a bed to a chair (including a wheelchair)?: None Help needed standing up from a chair using your arms (e.g., wheelchair or bedside chair)?: None Help needed to walk in hospital room?: A Little Help needed climbing 3-5 steps with a railing? : A Little 6 Click Score: 22    End of Session   Activity Tolerance: Patient tolerated treatment well Patient left: in bed;with call bell/phone within reach Nurse Communication: Other (comment);Mobility status (rt foot drop) PT Visit Diagnosis: Other abnormalities of gait and mobility (R26.89)    Time: 4327-6147 PT Time Calculation (min) (ACUTE ONLY): 15 min   Charges:   PT Evaluation $PT Eval Low Complexity: [place] Pager [phone] Office [place] 03/02/2021, 10:01 AM

## 2021-03-02 NOTE — Evaluation (Signed)
Occupational Therapy Evaluation/Discharge Patient Details Name: Michael Blackwell MRN: [id number] DOB: [birth year] Today's Date: 03/02/2021    History of Present Illness Pt is a 80 y.o. male who presented with pain in R foot. Consulted with vascular sugery and pt underwent abdominal aortogram w/ R lower extremity arteriogram. PMH: ESRD on HD, HTN, HLD, DM, and PAD.   Clinical Impression   PTA, pt lives with spouse and reports Modified Independence with ADLs, IADLs and mobility using cane. Pt presents now with minor deficits in dynamic standing balance though likely close to baseline. Pt able to demo mobility in room using cane with Supervision, denies pain with weightbearing. Pt Independent with UB ADLs and Setup for LB ADLs. Anticipate pt to quickly return to PLOF with no further skilled OT services indicated at acute level or on DC. Encouraged gradual progression of endurance and implementation of safety techniques at home. Pt verbalized understanding and hopeful to return home today. OT to sign off.     Follow Up Recommendations  No OT follow up;Supervision - Intermittent    Equipment Recommendations  None recommended by OT    Recommendations for Other Services       Precautions / Restrictions Precautions Precautions: Fall Restrictions Weight Bearing Restrictions: No      Mobility Bed Mobility Overal bed mobility: Independent                  Transfers Overall transfer level: Needs assistance Equipment used: Straight cane Transfers: Sit to/from Stand Sit to Stand: Supervision         General transfer comment: supervision for safety    Balance Overall balance assessment: Needs assistance Sitting-balance support: No upper extremity supported;Feet supported Sitting balance-Leahy Scale: Good     Standing balance support: Single extremity supported;During functional activity Standing balance-Leahy Scale: Fair Standing balance comment: fair static standing at sink,  use of cane for dynamic tasks                           ADL either performed or assessed with clinical judgement   ADL Overall ADL's : Needs assistance/impaired Eating/Feeding: Independent;Sitting   Grooming: Modified independent;Standing;Wash/dry face Grooming Details (indicate cue type and reason): no LOB or safety concerns             Lower Body Dressing: Sit to/from stand;Modified independent Lower Body Dressing Details (indicate cue type and reason): donned socks sitting EOB Toilet Transfer: Supervision/safety;Ambulation (cane)           Functional mobility during ADLs: Supervision/safety;Cane General ADL Comments: wide BOS with gait but functional and no LOB using cane. pt denies pain and dizziness with mobility     Vision Patient Visual Report: No change from baseline Vision Assessment?: No apparent visual deficits     Perception     Praxis      Pertinent Vitals/Pain Pain Assessment: No/denies pain     Hand Dominance Left   Extremity/Trunk Assessment Upper Extremity Assessment Upper Extremity Assessment: Overall WFL for tasks assessed   Lower Extremity Assessment Lower Extremity Assessment: Defer to PT evaluation   Cervical / Trunk Assessment Cervical / Trunk Assessment: Normal   Communication Communication Communication: No difficulties   Cognition Arousal/Alertness: Awake/alert Behavior During Therapy: WFL for tasks assessed/performed Overall Cognitive Status: Within Functional Limits for tasks assessed  General Comments  VSS on RA    Exercises     Shoulder Instructions      Home Living Family/patient expects to be discharged to:: Private residence Living Arrangements: Spouse/significant other Available Help at Discharge: Family;Available 24 hours/day Type of Home: House Home Access: Stairs to enter [employer] of Steps: 3 at front, 4 through garage Entrance  Stairs-Rails: Right;Left Home Layout: Two level;Able to live on main level with bedroom/bathroom     Bathroom Shower/Tub: Walk-in shower;Tub only;Tub/shower unit   Bathroom Toilet: Standard     Home Equipment: Cane - single point;Shower seat - built in;Grab bars - tub/shower;Grab bars - toilet;Hand held shower head          Prior Functioning/Environment Level of Independence: Independent with assistive device(s)        Comments: Use of SPC for mobility, denies any recent falls. Pt Modified Independent for ADLs, IADLs. Retired Medical illustrator.        OT Problem List: Impaired balance (sitting and/or standing)      OT Treatment/Interventions:      OT Goals(Current goals can be found in the care plan section) Acute Rehab OT Goals Patient Stated Goal: go home today, walk in hall OT Goal Formulation: All assessment and education complete, DC therapy  OT Frequency:     Barriers to D/C:            Co-evaluation              AM-PAC OT "6 Clicks" Daily Activity     Outcome Measure Help from another person eating meals?: None Help from another person taking care of personal grooming?: None Help from another person toileting, which includes using toliet, bedpan, or urinal?: A Little Help from another person bathing (including washing, rinsing, drying)?: A Little Help from another person to put on and taking off regular upper body clothing?: None Help from another person to put on and taking off regular lower body clothing?: None 6 Click Score: 22   End of Session Equipment Utilized During Treatment: Other (comment) (cane) Nurse Communication: Mobility status;Other (comment) (unable to set bed alarm)  Activity Tolerance: Patient tolerated treatment well Patient left: in bed;with call bell/phone within reach;Other (comment) (sitting EOB, unable to set bed alarm - screen on bed not working, RN aware)  OT Visit Diagnosis: Unsteadiness on feet (R26.81);Other  abnormalities of gait and mobility (R26.89)                Time: 6314-9702 OT Time Calculation (min): 21 min Charges:  OT General Charges $OT Visit: 1 Visit OT Evaluation $OT Eval Low Complexity: 1 Low  Malachy Chamber, OTR/L Acute Rehab Services Office: [phone]  Layla Maw 03/02/2021, 7:58 AM

## 2021-03-03 ENCOUNTER — Telehealth: Payer: Self-pay | Admitting: Nephrology

## 2021-03-03 LAB — HEPATITIS B E ANTIBODY: Hep B E Ab: NEGATIVE

## 2021-03-03 NOTE — Telephone Encounter (Signed)
Transition of care contact from inpatient facility  Date of discharge: 03/02/21  Date of contact: 03/03/21 Method: Phone Spoke to: Patient  Patient contacted to discuss transition of care from recent inpatient hospitalization. Patient was admitted to [facility] from 5/30-03/02/21  with discharge diagnosis of PAD with lower limb ischemia s/p revascularization of the right lower leg.   Medication changes were reviewed.  Patient will follow up with his/her outpatient HD unit on: His next dialysis will be on Monday 6/6.

## 2021-03-04 LAB — CULTURE, BLOOD (ROUTINE X 2)
Culture: NO GROWTH
Culture: NO GROWTH
Special Requests: ADEQUATE

## 2021-03-06 ENCOUNTER — Telehealth: Payer: Self-pay

## 2021-03-06 NOTE — Telephone Encounter (Signed)
Pt's wife called with pt beside her. Pt is c/o incisional swelling that has not been present until recently and he is c/o RLE rest pain with swelling and darkening in color at tip of the toe. Pt has been added to MD schedule tomorrow for f/u. He and his wife are both aware.

## 2021-03-07 ENCOUNTER — Other Ambulatory Visit: Payer: Self-pay | Admitting: Vascular Surgery

## 2021-03-07 ENCOUNTER — Other Ambulatory Visit (HOSPITAL_COMMUNITY): Payer: Self-pay | Admitting: Vascular Surgery

## 2021-03-07 ENCOUNTER — Ambulatory Visit (INDEPENDENT_AMBULATORY_CARE_PROVIDER_SITE_OTHER): Payer: Medicare Other | Admitting: Vascular Surgery

## 2021-03-07 ENCOUNTER — Other Ambulatory Visit: Payer: Self-pay

## 2021-03-07 ENCOUNTER — Encounter: Payer: Self-pay | Admitting: Vascular Surgery

## 2021-03-07 ENCOUNTER — Inpatient Hospital Stay (HOSPITAL_COMMUNITY): Payer: Medicare Other

## 2021-03-07 ENCOUNTER — Inpatient Hospital Stay (HOSPITAL_COMMUNITY)
Admission: AD | Admit: 2021-03-07 | Discharge: 2021-03-31 | DRG: 907 | Disposition: E | Payer: Medicare Other | Source: Ambulatory Visit | Attending: Vascular Surgery | Admitting: Vascular Surgery

## 2021-03-07 ENCOUNTER — Ambulatory Visit (INDEPENDENT_AMBULATORY_CARE_PROVIDER_SITE_OTHER)
Admission: RE | Admit: 2021-03-07 | Discharge: 2021-03-07 | Disposition: A | Discharge status: Home / Self Care | Payer: Medicare Other | Source: Ambulatory Visit | Attending: Vascular Surgery | Admitting: Vascular Surgery

## 2021-03-07 VITALS — BP 82/50 | HR 88 | Resp 20 | Ht 68.0 in | Wt 176.0 lb

## 2021-03-07 DIAGNOSIS — I2510 Atherosclerotic heart disease of native coronary artery without angina pectoris: Secondary | ICD-10-CM | POA: Diagnosis present

## 2021-03-07 DIAGNOSIS — E1151 Type 2 diabetes mellitus with diabetic peripheral angiopathy without gangrene: Secondary | ICD-10-CM | POA: Diagnosis present

## 2021-03-07 DIAGNOSIS — I70229 Atherosclerosis of native arteries of extremities with rest pain, unspecified extremity: Secondary | ICD-10-CM | POA: Diagnosis present

## 2021-03-07 DIAGNOSIS — Z79899 Other long term (current) drug therapy: Secondary | ICD-10-CM

## 2021-03-07 DIAGNOSIS — Z4659 Encounter for fitting and adjustment of other gastrointestinal appliance and device: Secondary | ICD-10-CM

## 2021-03-07 DIAGNOSIS — T80212A Local infection due to central venous catheter, initial encounter: Secondary | ICD-10-CM

## 2021-03-07 DIAGNOSIS — G4733 Obstructive sleep apnea (adult) (pediatric): Secondary | ICD-10-CM | POA: Diagnosis present

## 2021-03-07 DIAGNOSIS — Z808 Family history of malignant neoplasm of other organs or systems: Secondary | ICD-10-CM

## 2021-03-07 DIAGNOSIS — I509 Heart failure, unspecified: Secondary | ICD-10-CM | POA: Diagnosis present

## 2021-03-07 DIAGNOSIS — E11649 Type 2 diabetes mellitus with hypoglycemia without coma: Secondary | ICD-10-CM | POA: Diagnosis not present

## 2021-03-07 DIAGNOSIS — D696 Thrombocytopenia, unspecified: Secondary | ICD-10-CM | POA: Diagnosis not present

## 2021-03-07 DIAGNOSIS — I469 Cardiac arrest, cause unspecified: Secondary | ICD-10-CM | POA: Diagnosis not present

## 2021-03-07 DIAGNOSIS — E11621 Type 2 diabetes mellitus with foot ulcer: Secondary | ICD-10-CM | POA: Diagnosis present

## 2021-03-07 DIAGNOSIS — Y838 Other surgical procedures as the cause of abnormal reaction of the patient, or of later complication, without mention of misadventure at the time of the procedure: Secondary | ICD-10-CM | POA: Diagnosis not present

## 2021-03-07 DIAGNOSIS — T8119XA Other postprocedural shock, initial encounter: Secondary | ICD-10-CM | POA: Diagnosis not present

## 2021-03-07 DIAGNOSIS — F419 Anxiety disorder, unspecified: Secondary | ICD-10-CM | POA: Diagnosis present

## 2021-03-07 DIAGNOSIS — R2241 Localized swelling, mass and lump, right lower limb: Secondary | ICD-10-CM | POA: Diagnosis present

## 2021-03-07 DIAGNOSIS — I97648 Postprocedural seroma of a circulatory system organ or structure following other circulatory system procedure: Secondary | ICD-10-CM | POA: Diagnosis present

## 2021-03-07 DIAGNOSIS — E785 Hyperlipidemia, unspecified: Secondary | ICD-10-CM | POA: Diagnosis present

## 2021-03-07 DIAGNOSIS — I70223 Atherosclerosis of native arteries of extremities with rest pain, bilateral legs: Secondary | ICD-10-CM | POA: Diagnosis present

## 2021-03-07 DIAGNOSIS — N186 End stage renal disease: Secondary | ICD-10-CM | POA: Diagnosis present

## 2021-03-07 DIAGNOSIS — L7634 Postprocedural seroma of skin and subcutaneous tissue following other procedure: Secondary | ICD-10-CM | POA: Diagnosis present

## 2021-03-07 DIAGNOSIS — Z978 Presence of other specified devices: Secondary | ICD-10-CM

## 2021-03-07 DIAGNOSIS — E440 Moderate protein-calorie malnutrition: Secondary | ICD-10-CM | POA: Diagnosis not present

## 2021-03-07 DIAGNOSIS — N2581 Secondary hyperparathyroidism of renal origin: Secondary | ICD-10-CM | POA: Diagnosis present

## 2021-03-07 DIAGNOSIS — Z9889 Other specified postprocedural states: Secondary | ICD-10-CM

## 2021-03-07 DIAGNOSIS — I739 Peripheral vascular disease, unspecified: Secondary | ICD-10-CM

## 2021-03-07 DIAGNOSIS — E872 Acidosis: Secondary | ICD-10-CM | POA: Diagnosis not present

## 2021-03-07 DIAGNOSIS — R000 Tachycardia, unspecified: Secondary | ICD-10-CM | POA: Diagnosis present

## 2021-03-07 DIAGNOSIS — E1165 Type 2 diabetes mellitus with hyperglycemia: Secondary | ICD-10-CM | POA: Diagnosis not present

## 2021-03-07 DIAGNOSIS — J9690 Respiratory failure, unspecified, unspecified whether with hypoxia or hypercapnia: Secondary | ICD-10-CM

## 2021-03-07 DIAGNOSIS — K763 Infarction of liver: Secondary | ICD-10-CM | POA: Diagnosis not present

## 2021-03-07 DIAGNOSIS — I701 Atherosclerosis of renal artery: Secondary | ICD-10-CM | POA: Diagnosis present

## 2021-03-07 DIAGNOSIS — Z8546 Personal history of malignant neoplasm of prostate: Secondary | ICD-10-CM

## 2021-03-07 DIAGNOSIS — D62 Acute posthemorrhagic anemia: Secondary | ICD-10-CM | POA: Diagnosis not present

## 2021-03-07 DIAGNOSIS — R7401 Elevation of levels of liver transaminase levels: Secondary | ICD-10-CM | POA: Diagnosis not present

## 2021-03-07 DIAGNOSIS — Z992 Dependence on renal dialysis: Secondary | ICD-10-CM | POA: Diagnosis not present

## 2021-03-07 DIAGNOSIS — K551 Chronic vascular disorders of intestine: Secondary | ICD-10-CM | POA: Diagnosis present

## 2021-03-07 DIAGNOSIS — R197 Diarrhea, unspecified: Secondary | ICD-10-CM | POA: Diagnosis present

## 2021-03-07 DIAGNOSIS — R579 Shock, unspecified: Secondary | ICD-10-CM | POA: Diagnosis not present

## 2021-03-07 DIAGNOSIS — Z923 Personal history of irradiation: Secondary | ICD-10-CM

## 2021-03-07 DIAGNOSIS — Z452 Encounter for adjustment and management of vascular access device: Secondary | ICD-10-CM

## 2021-03-07 DIAGNOSIS — D631 Anemia in chronic kidney disease: Secondary | ICD-10-CM | POA: Diagnosis present

## 2021-03-07 DIAGNOSIS — Z800 Family history of malignant neoplasm of digestive organs: Secondary | ICD-10-CM

## 2021-03-07 DIAGNOSIS — K7200 Acute and subacute hepatic failure without coma: Secondary | ICD-10-CM | POA: Diagnosis not present

## 2021-03-07 DIAGNOSIS — M79671 Pain in right foot: Secondary | ICD-10-CM | POA: Diagnosis present

## 2021-03-07 DIAGNOSIS — E1122 Type 2 diabetes mellitus with diabetic chronic kidney disease: Secondary | ICD-10-CM | POA: Diagnosis present

## 2021-03-07 DIAGNOSIS — I248 Other forms of acute ischemic heart disease: Secondary | ICD-10-CM | POA: Diagnosis present

## 2021-03-07 DIAGNOSIS — J95821 Acute postprocedural respiratory failure: Secondary | ICD-10-CM | POA: Diagnosis not present

## 2021-03-07 DIAGNOSIS — I959 Hypotension, unspecified: Secondary | ICD-10-CM | POA: Diagnosis not present

## 2021-03-07 DIAGNOSIS — J9601 Acute respiratory failure with hypoxia: Secondary | ICD-10-CM | POA: Diagnosis not present

## 2021-03-07 DIAGNOSIS — I132 Hypertensive heart and chronic kidney disease with heart failure and with stage 5 chronic kidney disease, or end stage renal disease: Secondary | ICD-10-CM | POA: Diagnosis not present

## 2021-03-07 DIAGNOSIS — Z20822 Contact with and (suspected) exposure to covid-19: Secondary | ICD-10-CM | POA: Diagnosis present

## 2021-03-07 DIAGNOSIS — Z87891 Personal history of nicotine dependence: Secondary | ICD-10-CM

## 2021-03-07 DIAGNOSIS — I70221 Atherosclerosis of native arteries of extremities with rest pain, right leg: Secondary | ICD-10-CM | POA: Diagnosis not present

## 2021-03-07 DIAGNOSIS — Z7902 Long term (current) use of antithrombotics/antiplatelets: Secondary | ICD-10-CM

## 2021-03-07 DIAGNOSIS — Z8042 Family history of malignant neoplasm of prostate: Secondary | ICD-10-CM

## 2021-03-07 DIAGNOSIS — Z7982 Long term (current) use of aspirin: Secondary | ICD-10-CM

## 2021-03-07 DIAGNOSIS — Z6824 Body mass index (BMI) 24.0-24.9, adult: Secondary | ICD-10-CM

## 2021-03-07 DIAGNOSIS — L97519 Non-pressure chronic ulcer of other part of right foot with unspecified severity: Secondary | ICD-10-CM | POA: Diagnosis present

## 2021-03-07 DIAGNOSIS — M1990 Unspecified osteoarthritis, unspecified site: Secondary | ICD-10-CM | POA: Diagnosis present

## 2021-03-07 DIAGNOSIS — T68XXXA Hypothermia, initial encounter: Secondary | ICD-10-CM | POA: Diagnosis not present

## 2021-03-07 LAB — CBC
HCT: 28 % — ABNORMAL LOW (ref 39.0–52.0)
Hemoglobin: 8.7 g/dL — ABNORMAL LOW (ref 13.0–17.0)
MCH: 28.8 pg (ref 26.0–34.0)
MCHC: 31.1 g/dL (ref 30.0–36.0)
MCV: 92.7 fL (ref 80.0–100.0)
Platelets: 263 10*3/uL (ref 150–400)
RBC: 3.02 MIL/uL — ABNORMAL LOW (ref 4.22–5.81)
RDW: 16.4 % — ABNORMAL HIGH (ref 11.5–15.5)
WBC: 7.1 10*3/uL (ref 4.0–10.5)
nRBC: 0 % (ref 0.0–0.2)

## 2021-03-07 LAB — COMPREHENSIVE METABOLIC PANEL
ALT: 5 U/L (ref 0–44)
AST: 17 U/L (ref 15–41)
Albumin: 2.8 g/dL — ABNORMAL LOW (ref 3.5–5.0)
Alkaline Phosphatase: 54 U/L (ref 38–126)
Anion gap: 17 — ABNORMAL HIGH (ref 5–15)
BUN: 52 mg/dL — ABNORMAL HIGH (ref 8–23)
CO2: 27 mmol/L (ref 22–32)
Calcium: 9.2 mg/dL (ref 8.9–10.3)
Chloride: 93 mmol/L — ABNORMAL LOW (ref 98–111)
Creatinine, Ser: 8.41 mg/dL — ABNORMAL HIGH (ref 0.61–1.24)
GFR, Estimated: 6 mL/min — ABNORMAL LOW (ref >60)
Glucose, Bld: 191 mg/dL — ABNORMAL HIGH (ref 70–99)
Potassium: 4.3 mmol/L (ref 3.5–5.1)
Sodium: 137 mmol/L (ref 135–145)
Total Bilirubin: 0.4 mg/dL (ref 0.3–1.2)
Total Protein: 6.5 g/dL (ref 6.5–8.1)

## 2021-03-07 LAB — PROTIME-INR
INR: 1.2 (ref 0.8–1.2)
Prothrombin Time: 14.9 seconds (ref 11.4–15.2)

## 2021-03-07 MED ORDER — TAMSULOSIN HCL 0.4 MG PO CAPS
0.4000 mg | ORAL_CAPSULE | Freq: Every day | ORAL | Status: DC
  Filled 2021-03-07: qty 1

## 2021-03-07 MED ORDER — OXYCODONE-ACETAMINOPHEN 5-325 MG PO TABS
1.0000 | ORAL_TABLET | ORAL | Status: DC | PRN
Start: 2021-03-07 — End: 2021-03-08

## 2021-03-07 MED ORDER — HYDRALAZINE HCL 20 MG/ML IJ SOLN: 5.0000 mg | INTRAMUSCULAR | Status: DC | PRN

## 2021-03-07 MED ORDER — SODIUM CHLORIDE 0.9% FLUSH
3.0000 mL | Freq: Two times a day (BID) | INTRAVENOUS | Status: DC
  Administered 2021-03-07 – 2021-03-11 (×8): 3 mL via INTRAVENOUS

## 2021-03-07 MED ORDER — SODIUM CHLORIDE 0.9 % IV SOLN
250.0000 mL | INTRAVENOUS | Status: DC | PRN
  Administered 2021-03-10: 250 mL via INTRAVENOUS

## 2021-03-07 MED ORDER — CLONIDINE HCL 0.2 MG PO TABS: 0.3000 mg | ORAL_TABLET | Freq: Two times a day (BID) | ORAL | Status: DC

## 2021-03-07 MED ORDER — ACETAMINOPHEN 325 MG PO TABS: 325.0000 mg | ORAL_TABLET | ORAL | Status: DC | PRN

## 2021-03-07 MED ORDER — ONDANSETRON HCL 4 MG/2ML IJ SOLN
4.0000 mg | Freq: Four times a day (QID) | INTRAMUSCULAR | Status: DC | PRN
  Administered 2021-03-08: 4 mg via INTRAVENOUS

## 2021-03-07 MED ORDER — ACETAMINOPHEN 650 MG RE SUPP: 325.0000 mg | RECTAL | Status: DC | PRN

## 2021-03-07 MED ORDER — GABAPENTIN 100 MG PO CAPS
100.0000 mg | ORAL_CAPSULE | Freq: Three times a day (TID) | ORAL | Status: DC
  Administered 2021-03-08: 100 mg via ORAL
  Filled 2021-03-07: qty 1

## 2021-03-07 MED ORDER — SODIUM CHLORIDE 0.9 % IV BOLUS
1000.0000 mL | Freq: Once | INTRAVENOUS | Status: AC
  Administered 2021-03-07: 1000 mL via INTRAVENOUS

## 2021-03-07 MED ORDER — CALCIUM CARBONATE-VITAMIN D 500-200 MG-UNIT PO TABS
1.0000 | ORAL_TABLET | Freq: Every day | ORAL | Status: DC
  Administered 2021-03-09: 1 via ORAL
  Filled 2021-03-07: qty 1

## 2021-03-07 MED ORDER — HYDRALAZINE HCL 50 MG PO TABS: 100.0000 mg | ORAL_TABLET | Freq: Three times a day (TID) | ORAL | Status: DC

## 2021-03-07 MED ORDER — BISACODYL 10 MG RE SUPP
10.0000 mg | Freq: Every day | RECTAL | Status: DC | PRN
  Administered 2021-03-10: 10 mg via RECTAL
  Filled 2021-03-07: qty 1

## 2021-03-07 MED ORDER — PHENOL 1.4 % MT LIQD: 1.0000 | OROMUCOSAL | Status: DC | PRN

## 2021-03-07 MED ORDER — CLOPIDOGREL BISULFATE 75 MG PO TABS
75.0000 mg | ORAL_TABLET | Freq: Every day | ORAL | Status: DC
  Administered 2021-03-08: 75 mg via ORAL
  Filled 2021-03-07: qty 1

## 2021-03-07 MED ORDER — HEPARIN (PORCINE) 25000 UT/250ML-% IV SOLN
1150.0000 [IU]/h | INTRAVENOUS | Status: DC
  Administered 2021-03-07: 1000 [IU]/h via INTRAVENOUS
  Filled 2021-03-07: qty 250

## 2021-03-07 MED ORDER — SODIUM CHLORIDE 0.9% FLUSH
3.0000 mL | INTRAVENOUS | Status: DC | PRN
  Administered 2021-03-10 – 2021-03-11 (×2): 3 mL via INTRAVENOUS

## 2021-03-07 MED ORDER — HEPARIN BOLUS VIA INFUSION
3000.0000 [IU] | Freq: Once | INTRAVENOUS | Status: AC
  Administered 2021-03-07: 3000 [IU] via INTRAVENOUS
  Filled 2021-03-07: qty 3000

## 2021-03-07 MED ORDER — LABETALOL HCL 5 MG/ML IV SOLN: 10.0000 mg | INTRAVENOUS | Status: DC | PRN

## 2021-03-07 MED ORDER — AMLODIPINE BESYLATE 5 MG PO TABS: 5.0000 mg | ORAL_TABLET | Freq: Every day | ORAL | Status: DC

## 2021-03-07 MED ORDER — ASPIRIN EC 81 MG PO TBEC: 81.0000 mg | DELAYED_RELEASE_TABLET | Freq: Every day | ORAL | Status: DC

## 2021-03-07 MED ORDER — POLYETHYLENE GLYCOL 3350 17 G PO PACK: 17.0000 g | PACK | Freq: Every day | ORAL | Status: DC | PRN

## 2021-03-07 MED ORDER — CALCIUM CARBONATE ANTACID 500 MG PO CHEW
1.0000 | CHEWABLE_TABLET | Freq: Three times a day (TID) | ORAL | Status: DC
  Administered 2021-03-09: 200 mg via ORAL
  Filled 2021-03-07: qty 1

## 2021-03-07 MED ORDER — ROSUVASTATIN CALCIUM 5 MG PO TABS
10.0000 mg | ORAL_TABLET | Freq: Every day | ORAL | Status: DC
  Administered 2021-03-08: 10 mg via ORAL
  Filled 2021-03-07: qty 2

## 2021-03-07 MED ORDER — GUAIFENESIN-DM 100-10 MG/5ML PO SYRP: 15.0000 mL | ORAL_SOLUTION | ORAL | Status: DC | PRN

## 2021-03-07 MED ORDER — ISOSORBIDE MONONITRATE ER 30 MG PO TB24: 30.0000 mg | ORAL_TABLET | Freq: Every evening | ORAL | Status: DC

## 2021-03-07 MED ORDER — MORPHINE SULFATE (PF) 2 MG/ML IV SOLN
2.0000 mg | INTRAVENOUS | Status: DC | PRN
Start: 2021-03-07 — End: 2021-03-08

## 2021-03-07 MED ORDER — PANTOPRAZOLE SODIUM 40 MG PO TBEC: 40.0000 mg | DELAYED_RELEASE_TABLET | Freq: Every day | ORAL | Status: DC

## 2021-03-07 MED ORDER — METOPROLOL TARTRATE 5 MG/5ML IV SOLN: 2.0000 mg | INTRAVENOUS | Status: DC | PRN

## 2021-03-07 NOTE — Progress Notes (Signed)
ANTICOAGULATION CONSULT NOTE - Initial Consult  Pharmacy Consult for IV heparin Indication: ischemic limb  No Known Allergies  Patient Measurements:   Heparin Dosing Weight: 79 kg  Vital Signs: Temp: 98.8 F (37.1 C) (06/07 2020) Temp Source: Oral (06/07 2020) BP: 93/69 (06/07 2020) Pulse Rate: 90 (06/07 2020)  Labs: No results for input(s): HGB, HCT, PLT, APTT, LABPROT, INR, HEPARINUNFRC, HEPRLOWMOCWT, CREATININE, CKTOTAL, CKMB, TROPONINIHS in the last 72 hours.  Estimated Creatinine Clearance: 13 mL/min (A) (by C-G formula based on SCr of 4.59 mg/dL (H)).   Medical History: Past Medical History:  Diagnosis Date  . Acute respiratory failure with hypoxia ([place]) 12/05/2016   [name] 12/05/2016  . Anemia   . Anxiety   . Arthritis    "joints ache at night sometimes" (3/8/20180  . Chronic kidney disease (CKD), stage II (mild)    Acute on chronic kidney disease stage II-III/notes 12/06/2016  . Dyspnea    with exertion  . ESRD (end stage renal disease) ([place])    Hemo TTSAT [place]  . Heart murmur    never has caused any problems per patient  . History of blood transfusion   . History of radiation therapy 02/25/14- 04/23/14   prostate 7800 cGy 40 sessions, seminal vesicles 5600 cGy 40 sessions  . Hypercholesteremia   . Hypertension   . OSA on CPAP   . Pneumonia 12/06/2016  . Prostate cancer ([place]) 11/19/13   gleason 4+3=7, volume 34.74 cc  . Spermatocele    right  . Type II diabetes mellitus (H[facility])    diet controlled  . Wears partial dentures    upper    Medications:  Infusions:  . sodium chloride    . heparin      Assessment: 78 you male with ischemic limb.  No anticoagulants noted PTA.  Pharmacy asked to begin IV heparin tonight.  Likely planning intervention in the AM.  Goal of Therapy:  Heparin level 0.3-0.7 units/ml Monitor platelets by anticoagulation protocol: Yes   Plan:  1. Start IV heparin with small bolus of 3000 units x 1. 2. Then start heparin gtt  at 1000 units/hr. 3. Check heparin level in 8 hrs. 4. Daily heparin level and CBC. 5. F/u plans for intervention tomorrow.  [place] Crane, Roylene Reason, BCCP Clinical Pharmacist  03/05/2021 9:24 PM   [facility] pharmacy phone numbers are listed on [email or web address]

## 2021-03-07 NOTE — Progress Notes (Signed)
[facility] [place]  ASSESSMENT / PLAN: 80 y.o. male status post extended right ilio femoral endarterectomy.  He has some right groin swelling which I think is consistent with a small seroma or hematoma.  Concerning is his right great toe duskiness and superficial, interdigital ulceration.  Ankle-brachial index in the clinic today is 0.0 bilaterally.  We will plan to admit the patient directly to the hospital.  Obtain a CT angiogram of his aorta with runoff to the lower extremities.  Started heparin drip.  Check routine lab work.  N.p.o. after midnight in anticipation of intervention in the morning.  CHIEF COMPLAINT: Recent surgery, swelling in the groin.  HISTORY OF PRESENT ILLNESS: Michael Blackwell is a 80 y.o. male who returns to clinic after extensive right iliofemoral endarterectomy, and profundoplasty 03/01/2021 for ischemic rest pain with Dr. Donzetta Matters.  Tolerated this procedure quite well and was discharged postop day 1.  Patient reports swelling in the right groin for which he seeks evaluation.  His wife noticed some discoloration of his right great toe.  He has continued pain in his right foot similar to his pain prior to surgery.  He reports no symptoms in his left foot.  He is ambulatory with significant assistance.  He has no wounds about his left foot.  Past Medical History:  Diagnosis Date  . Acute respiratory failure with hypoxia ([place]) 12/05/2016   [name] 12/05/2016  . Anemia   . Anxiety   . Arthritis    "joints ache at night sometimes" (3/8/20180  . Chronic kidney disease (CKD), stage II (mild)    Acute on chronic kidney disease stage II-III/notes 12/06/2016  . Dyspnea    with exertion  . ESRD (end stage renal disease) ([place])    [facility] TTSAT [place]  . Heart murmur    never has caused any problems per patient  . History of blood transfusion   . History of radiation therapy 02/25/14- 04/23/14   prostate 7800 cGy 40 sessions, seminal vesicles 5600 cGy 40 sessions   . Hypercholesteremia   . Hypertension   . OSA on CPAP   . Pneumonia 12/06/2016  . Prostate cancer ([place]) 11/19/13   gleason 4+3=7, volume 34.74 cc  . Spermatocele    right  . Type II diabetes mellitus ([facility])    diet controlled  . Wears partial dentures    upper    Past Surgical History:  Procedure Laterality Date  . ABDOMINAL AORTOGRAM W/LOWER EXTREMITY N/A 07/11/2020   Procedure: ABDOMINAL AORTOGRAM W/LOWER EXTREMITY;  Surgeon: [name], MD;  Location: [facility];  Service: Cardiovascular;  Laterality: N/A;  . ABDOMINAL AORTOGRAM W/LOWER EXTREMITY Bilateral 02/28/2021   Procedure: ABDOMINAL AORTOGRAM W/LOWER EXTREMITY;  Surgeon: Serafina Mitchell, MD;  Location: [facility];  Service: Cardiovascular;  Laterality: Bilateral;  . AV FISTULA PLACEMENT Right 03/04/2019   Procedure: RIGHT ARM ARTERIOVENOUS  FISTULA CREATION;  Surgeon: [name], MD;  Location: [facility];  Service: Vascular;  Laterality: Right;  . AV FISTULA PLACEMENT Right 12/30/2019   Procedure: INSERTION OF right  arm ARTERIOVENOUS (AV) GORE-TEX GRAFT;  Surgeon: [name], MD;  Location: [facility];  Service: Vascular;  Laterality: Right;  . BASCILIC VEIN TRANSPOSITION Right 05/26/2019   Procedure: Right Arm BASILIC VEIN TRANSPOSITION SECOND STAGE;  Surgeon: [name], MD;  Location: [facility];  Service: Vascular;  Laterality: Right;  . COLONOSCOPY    . ENDARTERECTOMY FEMORAL  03/01/2021   Procedure: ENDARTERECTOMY FEMORAL;  Surgeon: [name], MD;  Location: [facility];  Service: Vascular;;  . LOWER EXTREMITY ANGIOGRAPHY Bilateral 02/25/2018   Procedure: Lower Extremity Angiography;  Surgeon: [name], MD;  Location: [facility];  Service: Cardiovascular;  Laterality: Bilateral;  limited  . PATCH ANGIOPLASTY Right 03/01/2021   Procedure: PATCH ANGIOPLASTY USING RIGHT GREATER SAPHENOUS VEIN;  Surgeon: [name], MD;  Location:  [facility];  Service: Vascular;  Laterality: Right;  . PERIPHERAL VASCULAR ATHERECTOMY Right 07/11/2020   Procedure: PERIPHERAL VASCULAR ATHERECTOMY;  Surgeon: [name], MD;  Location: [facility];  Service: Cardiovascular;  Laterality: Right;  common femoral; sfa  . PERIPHERAL VASCULAR BALLOON ANGIOPLASTY Right 07/11/2020   Procedure: PERIPHERAL VASCULAR BALLOON ANGIOPLASTY;  Surgeon: [name], MD;  Location: [facility];  Service: Cardiovascular;  Laterality: Right;  [employer]  . PERIPHERAL VASCULAR INTERVENTION Right 07/11/2020   Procedure: PERIPHERAL VASCULAR INTERVENTION;  Surgeon: [name], MD;  Location: [facility];  Service: Cardiovascular;  Laterality: Right;  common femoral and superficial femoral stent  . PROSTATE BIOPSY  11/19/13   Gleason 4+3=7, vol 34.74 cc  . RENAL ANGIOGRAPHY N/A 02/25/2018   Procedure: RENAL ANGIOGRAPHY;  Surgeon: [name], MD;  Location: [facility];  Service: Cardiovascular;  Laterality: N/A;  . TONSILLECTOMY     as child    Family History  Problem Relation Age of Onset  . Cancer Brother        brain  . Cancer Mother        stomach  . Cancer Father        ? prostate    Social History   Socioeconomic History  . Marital status: Married    Spouse name: Not on file  . Number of children: Not on file  . Years of education: Not on file  . Highest education level: Not on file  Occupational History  . Not on file  Tobacco Use  . Smoking status: Former Smoker    Years: 48.00    Types: Cigarettes    Quit date: 07/02/2019    Years since quitting: 1.6  . Smokeless tobacco: Never Used  . Tobacco comment: "I smoke 1 cig a month"- per patient on 05/25/19  Vaping Use  . Vaping Use: Never used  Substance and Sexual Activity  . Alcohol use: Not Currently  . Drug use: No  . Sexual activity: Yes  Other Topics Concern  . Not on file  Social History Narrative  . Not on  file   Social Determinants of Health   Financial Resource Strain: Not on file  Food Insecurity: Not on file  Transportation Needs: Not on file  Physical Activity: Not on file  Stress: Not on file  Social Connections: Not on file  Intimate Partner Violence: Not on file    No Known Allergies  Current Outpatient Medications  Medication Sig Dispense Refill  . acetaminophen (TYLENOL) 325 MG tablet Take 1-2 tablets (325-650 mg total) by mouth every 4 (four) hours as needed for mild pain (or temp >/= 101 F). 30 tablet 0  . amLODipine (NORVASC) 10 MG tablet Take 5 mg by mouth at bedtime.     Marland Kitchen aspirin EC 81 MG tablet Take 81 mg by mouth at bedtime.    . B Complex-C (B-COMPLEX WITH VITAMIN C) tablet Take 1 tablet by mouth daily.    . Calcium Carb-Cholecalciferol 600-800 MG-UNIT TABS Take 1 tablet by mouth daily.     Marland Kitchen  calcium carbonate (TUMS - DOSED IN MG ELEMENTAL CALCIUM) 500 MG chewable tablet Chew 1 tablet by mouth 3 (three) times daily after meals.    . cloNIDine (CATAPRES) 0.3 MG tablet Take 0.6 mg by mouth 2 (two) times daily.     . clopidogrel (PLAVIX) 75 MG tablet Take 1 tablet (75 mg total) by mouth daily. 90 tablet 3  . gabapentin (NEURONTIN) 100 MG capsule Take 1 capsule (100 mg total) by mouth 3 (three) times daily. 90 capsule 3  . hydrALAZINE (APRESOLINE) 100 MG tablet Take 100 mg by mouth 3 (three) times daily.     . isosorbide mononitrate (IMDUR) 30 MG 24 hr tablet Take 30 mg by mouth every evening.     . lidocaine-prilocaine (EMLA) cream Apply 1 application topically daily as needed (prior to port being accessed).     . Omega-3 Fatty Acids (OMEGA 3 PO) Take 1,040 mg by mouth daily.     Marland Kitchen oxyCODONE-acetaminophen (PERCOCET/ROXICET) 5-325 MG tablet Take 2 tablets by mouth every 6 (six) hours as needed for moderate pain. 20 tablet 0  . polyethylene glycol (MIRALAX / GLYCOLAX) 17 g packet Take 17 g by mouth daily as needed (constipation.).    Marland Kitchen rosuvastatin (CRESTOR) 10 MG tablet  Take 1 tablet (10 mg total) by mouth daily. 90 tablet 3  . tamsulosin (FLOMAX) 0.4 MG CAPS capsule Take 1 capsule (0.4 mg total) by mouth every other day. (Patient taking differently: Take 0.4 mg by mouth at bedtime.)    . vitamin E 180 MG (400 UNITS) capsule Take 400 Units by mouth daily.      Current Facility-Administered Medications  Medication Dose Route Frequency Provider Last Rate Last Admin  . 0.9 %  sodium chloride infusion  250 mL Intravenous PRN [name], MD        REVIEW OF SYSTEMS:  [X]  denotes positive finding, [ ]  denotes negative finding Cardiac  Comments:  Chest pain or chest pressure:    Shortness of breath upon exertion:    Short of breath when lying flat:    Irregular heart rhythm:        Vascular    Pain in calf, thigh, or hip brought on by ambulation:    Pain in feet at night that wakes you up from your sleep:     Blood clot in your veins:    Leg swelling:         Pulmonary    Oxygen at home:    Productive cough:     Wheezing:         Neurologic    Sudden weakness in arms or legs:     Sudden numbness in arms or legs:     Sudden onset of difficulty speaking or slurred speech:    Temporary loss of vision in one eye:     Problems with dizziness:         Gastrointestinal    Blood in stool:     Vomited blood:         Genitourinary    Burning when urinating:     Blood in urine:        Psychiatric    Major depression:         Hematologic    Bleeding problems:    Problems with blood clotting too easily:        Skin    Rashes or ulcers:        Constitutional    Fever or chills:  PHYSICAL EXAM  Vitals:   03/04/2021 1439  BP: (!) 82/50  Pulse: 88  Resp: 20  SpO2: 99%  Weight: 176 lb (79.8 kg)  Height: 5\' 8"  (1.727 m)    Constitutional: elderly, chronically ill appearing. no distress. Appears well nourished.  Neurologic: CN intact. no focal findings. no sensory loss. Psychiatric: Mood and affect symmetric and  appropriate. Eyes: No icterus. No conjunctival pallor. Ears, nose, throat: mucous membranes moist. Midline trachea.  Cardiac: regular rate and rhythm.  Respiratory: unlabored. Abdominal: soft, non-tender, non-distended.  Peripheral vascular:  No palpable right femoral pulse  No palpable pedal pulses bilaterally  No Doppler flow identified in either foot  Right great toe dusky  Interdigital right great toe ulceration Extremity: No edema. No cyanosis. No pallor.  Skin: No gangrene. Lymphatic: No Stemmer's sign. No palpable lymphadenopathy.  PERTINENT LABORATORY AND RADIOLOGIC DATA  Most recent CBC CBC Latest Ref Rng & Units 03/02/2021 03/01/2021 03/01/2021  WBC 4.0 - 10.5 K/uL 7.9 7.9 6.5  Hemoglobin 13.0 - 17.0 g/dL 9.7(L) 10.8(L) 11.5(L)  Hematocrit 39.0 - 52.0 % 29.5(L) 33.5(L) 35.3(L)  Platelets 150 - 400 K/uL 155 172 186     Most recent CMP CMP Latest Ref Rng & Units 03/02/2021 03/01/2021 03/01/2021  Glucose 70 - 99 mg/dL 83 - 63(L)  BUN 8 - 23 mg/dL 21 - 54(H)  Creatinine 0.61 - 1.24 mg/dL 4.59(H) 8.91(H) 8.40(H)  Sodium 135 - 145 mmol/L 137 - 136  Potassium 3.5 - 5.1 mmol/L 3.4(L) - 4.3  Chloride 98 - 111 mmol/L 97(L) - 95(L)  CO2 22 - 32 mmol/L 29 - 22  Calcium 8.9 - 10.3 mg/dL 8.8(L) - 8.9  Total Protein 6.5 - 8.1 g/dL - - -  Total Bilirubin 0.3 - 1.2 mg/dL - - -  Alkaline Phos 38 - 126 U/L - - -  AST 15 - 41 U/L - - -  ALT 0 - 44 U/L - - -    Renal function Estimated Creatinine Clearance: 13 mL/min (A) (by C-G formula based on SCr of 4.59 mg/dL (H)).  Hgb A1c MFr Bld (%)  Date Value  02/27/2021 5.7 (H)    LDL Cholesterol  Date Value Ref Range Status  03/02/2021 90 0 - 99 mg/dL Final    Comment:           Total Cholesterol/HDL:CHD Risk Coronary Heart Disease Risk Table                     Men   Women  1/2 Average Risk   3.4   3.3  Average Risk       5.0   4.4  2 X Average Risk   9.6   7.1  3 X Average Risk  23.4   11.0        Use the calculated Patient  Ratio above and the CHD Risk Table to determine the patient's CHD Risk.        ATP III CLASSIFICATION (LDL):  <100     mg/dL   Optimal  100-129  mg/dL   Near or Above                    Optimal  130-159  mg/dL   Borderline  160-189  mg/dL   High  >190     mg/dL   Very High Performed at [facility] [address] [place], [place] [address]     ABI 6.7.22   Yevonne Aline.  Stanford Breed, MD [facility] Phone Number: [phone] 03/16/2021 5:39 PM

## 2021-03-08 ENCOUNTER — Inpatient Hospital Stay (HOSPITAL_COMMUNITY): Payer: Medicare Other

## 2021-03-08 ENCOUNTER — Encounter (HOSPITAL_COMMUNITY): Admission: AD | Disposition: E | Payer: Self-pay | Source: Ambulatory Visit | Attending: Vascular Surgery

## 2021-03-08 ENCOUNTER — Inpatient Hospital Stay (HOSPITAL_COMMUNITY): Payer: Medicare Other | Admitting: Anesthesiology

## 2021-03-08 ENCOUNTER — Encounter (HOSPITAL_COMMUNITY): Payer: Self-pay | Admitting: Vascular Surgery

## 2021-03-08 DIAGNOSIS — I70221 Atherosclerosis of native arteries of extremities with rest pain, right leg: Secondary | ICD-10-CM

## 2021-03-08 DIAGNOSIS — I70229 Atherosclerosis of native arteries of extremities with rest pain, unspecified extremity: Secondary | ICD-10-CM

## 2021-03-08 DIAGNOSIS — N186 End stage renal disease: Secondary | ICD-10-CM

## 2021-03-08 DIAGNOSIS — Z452 Encounter for adjustment and management of vascular access device: Secondary | ICD-10-CM

## 2021-03-08 DIAGNOSIS — R579 Shock, unspecified: Secondary | ICD-10-CM

## 2021-03-08 HISTORY — PX: PATCH ANGIOPLASTY: SHX6230

## 2021-03-08 HISTORY — PX: LOWER EXTREMITY ANGIOGRAM: SHX5508

## 2021-03-08 HISTORY — PX: FEMORAL-POPLITEAL BYPASS GRAFT: SHX937

## 2021-03-08 LAB — POCT I-STAT 7, (LYTES, BLD GAS, ICA,H+H)
Acid-Base Excess: 0 mmol/L (ref 0.0–2.0)
Acid-base deficit: 2 mmol/L (ref 0.0–2.0)
Bicarbonate: 24.8 mmol/L (ref 20.0–28.0)
Bicarbonate: 22.5 mmol/L (ref 20.0–28.0)
Calcium, Ion: 1.01 mmol/L — ABNORMAL LOW (ref 1.15–1.40)
Calcium, Ion: 1.16 mmol/L (ref 1.15–1.40)
HCT: 31 % — ABNORMAL LOW (ref 39.0–52.0)
HCT: 23 % — ABNORMAL LOW (ref 39.0–52.0)
Hemoglobin: 10.5 g/dL — ABNORMAL LOW (ref 13.0–17.0)
Hemoglobin: 7.8 g/dL — ABNORMAL LOW (ref 13.0–17.0)
O2 Saturation: 100 %
O2 Saturation: 100 %
Patient temperature: 37.3
Patient temperature: 98.3
Potassium: 4.7 mmol/L (ref 3.5–5.1)
Potassium: 4.8 mmol/L (ref 3.5–5.1)
Sodium: 137 mmol/L (ref 135–145)
Sodium: 138 mmol/L (ref 135–145)
TCO2: 26 mmol/L (ref 22–32)
TCO2: 24 mmol/L (ref 22–32)
pCO2 arterial: 39.6 mmHg (ref 32.0–48.0)
pCO2 arterial: 36.2 mmHg (ref 32.0–48.0)
pH, Arterial: 7.405 (ref 7.350–7.450)
pH, Arterial: 7.4 (ref 7.350–7.450)
pO2, Arterial: 214 mmHg — ABNORMAL HIGH (ref 83.0–108.0)
pO2, Arterial: 499 mmHg — ABNORMAL HIGH (ref 83.0–108.0)

## 2021-03-08 LAB — COMPREHENSIVE METABOLIC PANEL
ALT: 6 U/L (ref 0–44)
AST: 11 U/L — ABNORMAL LOW (ref 15–41)
Albumin: 2.6 g/dL — ABNORMAL LOW (ref 3.5–5.0)
Alkaline Phosphatase: 34 U/L — ABNORMAL LOW (ref 38–126)
Anion gap: 18 — ABNORMAL HIGH (ref 5–15)
BUN: 57 mg/dL — ABNORMAL HIGH (ref 8–23)
CO2: 18 mmol/L — ABNORMAL LOW (ref 22–32)
Calcium: 8.4 mg/dL — ABNORMAL LOW (ref 8.9–10.3)
Chloride: 103 mmol/L (ref 98–111)
Creatinine, Ser: 9.25 mg/dL — ABNORMAL HIGH (ref 0.61–1.24)
GFR, Estimated: 5 mL/min — ABNORMAL LOW (ref >60)
Glucose, Bld: 201 mg/dL — ABNORMAL HIGH (ref 70–99)
Potassium: 4.9 mmol/L (ref 3.5–5.1)
Sodium: 139 mmol/L (ref 135–145)
Total Bilirubin: 1 mg/dL (ref 0.3–1.2)
Total Protein: 4.9 g/dL — ABNORMAL LOW (ref 6.5–8.1)

## 2021-03-08 LAB — PROTIME-INR
INR: 1.3 — ABNORMAL HIGH (ref 0.8–1.2)
Prothrombin Time: 15.8 seconds — ABNORMAL HIGH (ref 11.4–15.2)

## 2021-03-08 LAB — CBC
HCT: 25.8 % — ABNORMAL LOW (ref 39.0–52.0)
Hemoglobin: 8 g/dL — ABNORMAL LOW (ref 13.0–17.0)
MCH: 28.6 pg (ref 26.0–34.0)
MCHC: 31 g/dL (ref 30.0–36.0)
MCV: 92.1 fL (ref 80.0–100.0)
Platelets: 244 10*3/uL (ref 150–400)
RBC: 2.8 MIL/uL — ABNORMAL LOW (ref 4.22–5.81)
RDW: 16.4 % — ABNORMAL HIGH (ref 11.5–15.5)
WBC: 5.4 10*3/uL (ref 4.0–10.5)
nRBC: 0 % (ref 0.0–0.2)

## 2021-03-08 LAB — POCT ACTIVATED CLOTTING TIME
Activated Clotting Time: 243 seconds
Activated Clotting Time: 237 seconds
Activated Clotting Time: 225 seconds
Activated Clotting Time: 255 seconds
Activated Clotting Time: 273 seconds
Activated Clotting Time: 249 seconds

## 2021-03-08 LAB — PREPARE RBC (CROSSMATCH)

## 2021-03-08 LAB — FIBRINOGEN: Fibrinogen: 364 mg/dL (ref 210–475)

## 2021-03-08 LAB — BRAIN NATRIURETIC PEPTIDE: B Natriuretic Peptide: 387.2 pg/mL — ABNORMAL HIGH (ref 0.0–100.0)

## 2021-03-08 LAB — HEPARIN LEVEL (UNFRACTIONATED): Heparin Unfractionated: 0.21 IU/mL — ABNORMAL LOW (ref 0.30–0.70)

## 2021-03-08 LAB — SARS CORONAVIRUS 2 (TAT 6-24 HRS): SARS Coronavirus 2: NEGATIVE

## 2021-03-08 LAB — LACTIC ACID, PLASMA: Lactic Acid, Venous: 1.4 mmol/L (ref 0.5–1.9)

## 2021-03-08 LAB — GLUCOSE, CAPILLARY: Glucose-Capillary: 114 mg/dL — ABNORMAL HIGH (ref 70–99)

## 2021-03-08 SURGERY — BYPASS GRAFT FEMORAL-POPLITEAL ARTERY
Anesthesia: General | Site: Groin | Laterality: Right

## 2021-03-08 MED ORDER — CHLORHEXIDINE GLUCONATE 0.12 % MT SOLN
OROMUCOSAL | Status: AC
  Administered 2021-03-08: 15 mL via OROMUCOSAL
  Filled 2021-03-08: qty 15

## 2021-03-08 MED ORDER — SODIUM CHLORIDE 0.9 % IV SOLN: 500.0000 mL | Freq: Once | INTRAVENOUS | Status: DC | PRN

## 2021-03-08 MED ORDER — CLOPIDOGREL BISULFATE 75 MG PO TABS
75.0000 mg | ORAL_TABLET | Freq: Every day | ORAL | Status: DC
  Administered 2021-03-09 – 2021-03-11 (×3): 75 mg
  Filled 2021-03-08 (×3): qty 1

## 2021-03-08 MED ORDER — FENTANYL CITRATE (PF) 100 MCG/2ML IJ SOLN: 25.0000 ug | INTRAMUSCULAR | Status: DC | PRN

## 2021-03-08 MED ORDER — HEPARIN SODIUM (PORCINE) 1000 UNIT/ML IJ SOLN
INTRAMUSCULAR | Status: DC | PRN
  Administered 2021-03-08: 2000 [IU] via INTRAVENOUS
  Administered 2021-03-08: 5000 [IU] via INTRAVENOUS
  Administered 2021-03-08: 8000 [IU] via INTRAVENOUS

## 2021-03-08 MED ORDER — CEFAZOLIN SODIUM-DEXTROSE 2-3 GM-%(50ML) IV SOLR
INTRAVENOUS | Status: DC | PRN
  Administered 2021-03-08: 2 g via INTRAVENOUS

## 2021-03-08 MED ORDER — SODIUM CHLORIDE 0.9 % IV SOLN
INTRAVENOUS | Status: DC | PRN
  Administered 2021-03-08: 500 mL

## 2021-03-08 MED ORDER — HEPARIN SODIUM (PORCINE) 5000 UNIT/ML IJ SOLN
5000.0000 [IU] | Freq: Three times a day (TID) | INTRAMUSCULAR | Status: DC
  Administered 2021-03-09 (×2): 5000 [IU] via SUBCUTANEOUS
  Filled 2021-03-08 (×2): qty 1

## 2021-03-08 MED ORDER — CHLORHEXIDINE GLUCONATE 0.12 % MT SOLN: 15.0000 mL | Freq: Once | OROMUCOSAL | Status: AC

## 2021-03-08 MED ORDER — CHLORHEXIDINE GLUCONATE CLOTH 2 % EX PADS
6.0000 | MEDICATED_PAD | Freq: Every day | CUTANEOUS | Status: DC
  Administered 2021-03-08 – 2021-03-10 (×3): 6 via TOPICAL

## 2021-03-08 MED ORDER — CEFAZOLIN SODIUM-DEXTROSE 2-4 GM/100ML-% IV SOLN
2.0000 g | Freq: Three times a day (TID) | INTRAVENOUS | Status: DC
  Filled 2021-03-08 (×2): qty 100

## 2021-03-08 MED ORDER — SENNOSIDES-DOCUSATE SODIUM 8.6-50 MG PO TABS: 1.0000 | ORAL_TABLET | Freq: Every evening | ORAL | Status: DC | PRN

## 2021-03-08 MED ORDER — VASOPRESSIN 20 UNIT/ML IV SOLN
INTRAVENOUS | Status: AC
  Filled 2021-03-08: qty 1

## 2021-03-08 MED ORDER — ALBUMIN HUMAN 5 % IV SOLN: INTRAVENOUS | Status: DC | PRN

## 2021-03-08 MED ORDER — ONDANSETRON HCL 4 MG/2ML IJ SOLN
INTRAMUSCULAR | Status: AC
  Filled 2021-03-08: qty 2

## 2021-03-08 MED ORDER — CEFAZOLIN SODIUM-DEXTROSE 2-4 GM/100ML-% IV SOLN
2.0000 g | Freq: Three times a day (TID) | INTRAVENOUS | Status: AC
  Administered 2021-03-08: 2 g via INTRAVENOUS
  Filled 2021-03-08: qty 100

## 2021-03-08 MED ORDER — PANTOPRAZOLE SODIUM 40 MG IV SOLR
40.0000 mg | INTRAVENOUS | Status: DC
  Administered 2021-03-09: 40 mg via INTRAVENOUS
  Filled 2021-03-08: qty 40

## 2021-03-08 MED ORDER — IOHEXOL 350 MG/ML SOLN
100.0000 mL | Freq: Once | INTRAVENOUS | Status: AC | PRN
  Administered 2021-03-08: 100 mL via INTRAVENOUS

## 2021-03-08 MED ORDER — FENTANYL CITRATE (PF) 250 MCG/5ML IJ SOLN
INTRAMUSCULAR | Status: AC
  Filled 2021-03-08: qty 5

## 2021-03-08 MED ORDER — NOREPINEPHRINE 16 MG/250ML-% IV SOLN
0.0000 ug/min | INTRAVENOUS | Status: DC
  Administered 2021-03-09: 50 ug/min via INTRAVENOUS
  Administered 2021-03-09: 2 ug/min via INTRAVENOUS
  Filled 2021-03-08 (×3): qty 250

## 2021-03-08 MED ORDER — SODIUM CHLORIDE 0.9 % IV SOLN: INTRAVENOUS | Status: DC

## 2021-03-08 MED ORDER — POTASSIUM CHLORIDE CRYS ER 20 MEQ PO TBCR: 20.0000 meq | EXTENDED_RELEASE_TABLET | Freq: Every day | ORAL | Status: DC | PRN

## 2021-03-08 MED ORDER — PROPOFOL 500 MG/50ML IV EMUL
INTRAVENOUS | Status: DC | PRN
  Administered 2021-03-08: 50 ug/kg/min via INTRAVENOUS

## 2021-03-08 MED ORDER — SODIUM CHLORIDE 0.9 % IV SOLN: 10.0000 mL/h | Freq: Once | INTRAVENOUS | Status: DC

## 2021-03-08 MED ORDER — PRISMASOL BGK 4/2.5 32-4-2.5 MEQ/L REPLACEMENT SOLN: Status: DC

## 2021-03-08 MED ORDER — IODIXANOL 320 MG/ML IV SOLN
INTRAVENOUS | Status: DC | PRN
  Administered 2021-03-08: 20 mL

## 2021-03-08 MED ORDER — HEMOSTATIC AGENTS (NO CHARGE) OPTIME
TOPICAL | Status: DC | PRN
  Administered 2021-03-08 (×2): 1

## 2021-03-08 MED ORDER — VASOPRESSIN 20 UNIT/ML IV SOLN
INTRAVENOUS | Status: DC | PRN
  Administered 2021-03-08 (×5): 1 [IU] via INTRAVENOUS

## 2021-03-08 MED ORDER — ORAL CARE MOUTH RINSE: 15.0000 mL | Freq: Once | OROMUCOSAL | Status: AC

## 2021-03-08 MED ORDER — CLOPIDOGREL BISULFATE 75 MG PO TABS: 75.0000 mg | ORAL_TABLET | Freq: Every day | ORAL | Status: DC

## 2021-03-08 MED ORDER — PROPOFOL 10 MG/ML IV BOLUS
INTRAVENOUS | Status: DC | PRN
  Administered 2021-03-08: 120 mg via INTRAVENOUS

## 2021-03-08 MED ORDER — FENTANYL CITRATE (PF) 250 MCG/5ML IJ SOLN
INTRAMUSCULAR | Status: DC | PRN
  Administered 2021-03-08 (×2): 50 ug via INTRAVENOUS
  Administered 2021-03-08: 100 ug via INTRAVENOUS
  Administered 2021-03-08 (×3): 50 ug via INTRAVENOUS

## 2021-03-08 MED ORDER — PHENYLEPHRINE 40 MCG/ML (10ML) SYRINGE FOR IV PUSH (FOR BLOOD PRESSURE SUPPORT)
PREFILLED_SYRINGE | INTRAVENOUS | Status: DC | PRN
  Administered 2021-03-08 (×2): 80 ug via INTRAVENOUS
  Administered 2021-03-08 (×3): 40 ug via INTRAVENOUS

## 2021-03-08 MED ORDER — PHENYLEPHRINE HCL-NACL 10-0.9 MG/250ML-% IV SOLN
INTRAVENOUS | Status: DC | PRN
  Administered 2021-03-08: 50 ug/min via INTRAVENOUS

## 2021-03-08 MED ORDER — PRISMASOL BGK 4/2.5 32-4-2.5 MEQ/L EC SOLN: Status: DC

## 2021-03-08 MED ORDER — ROCURONIUM BROMIDE 10 MG/ML (PF) SYRINGE
PREFILLED_SYRINGE | INTRAVENOUS | Status: DC | PRN
  Administered 2021-03-08: 40 mg via INTRAVENOUS
  Administered 2021-03-08: 20 mg via INTRAVENOUS
  Administered 2021-03-08: 30 mg via INTRAVENOUS
  Administered 2021-03-08: 60 mg via INTRAVENOUS

## 2021-03-08 MED ORDER — CEFAZOLIN SODIUM-DEXTROSE 2-4 GM/100ML-% IV SOLN
INTRAVENOUS | Status: AC
  Filled 2021-03-08: qty 100

## 2021-03-08 MED ORDER — 0.9 % SODIUM CHLORIDE (POUR BTL) OPTIME
TOPICAL | Status: DC | PRN
  Administered 2021-03-08: 1000 mL

## 2021-03-08 MED ORDER — CALCIUM CHLORIDE 10 % IV SOLN
INTRAVENOUS | Status: DC | PRN
  Administered 2021-03-08: 1 g via INTRAVENOUS

## 2021-03-08 MED ORDER — ARTIFICIAL TEARS OPHTHALMIC OINT
TOPICAL_OINTMENT | OPHTHALMIC | Status: AC
  Filled 2021-03-08: qty 3.5

## 2021-03-08 MED ORDER — LIDOCAINE 2% (20 MG/ML) 5 ML SYRINGE
INTRAMUSCULAR | Status: DC | PRN
  Administered 2021-03-08: 100 mg via INTRAVENOUS

## 2021-03-08 MED ORDER — FENTANYL CITRATE (PF) 100 MCG/2ML IJ SOLN
25.0000 ug | INTRAMUSCULAR | Status: DC | PRN
  Administered 2021-03-09 (×4): 100 ug via INTRAVENOUS
  Filled 2021-03-08 (×4): qty 2

## 2021-03-08 MED ORDER — ROSUVASTATIN CALCIUM 5 MG PO TABS
10.0000 mg | ORAL_TABLET | Freq: Every day | ORAL | Status: DC
  Administered 2021-03-09 – 2021-03-11 (×3): 10 mg
  Filled 2021-03-08 (×3): qty 2

## 2021-03-08 MED ORDER — SUGAMMADEX SODIUM 200 MG/2ML IV SOLN
INTRAVENOUS | Status: DC | PRN
  Administered 2021-03-08: 150 mg via INTRAVENOUS

## 2021-03-08 MED ORDER — NOREPINEPHRINE 4 MG/250ML-% IV SOLN
INTRAVENOUS | Status: DC | PRN
  Administered 2021-03-08: 1 ug/min via INTRAVENOUS

## 2021-03-08 MED ORDER — ASPIRIN 81 MG PO CHEW
81.0000 mg | CHEWABLE_TABLET | Freq: Every day | ORAL | Status: DC
  Administered 2021-03-09 – 2021-03-11 (×4): 81 mg
  Filled 2021-03-08 (×4): qty 1

## 2021-03-08 MED ORDER — ALUM & MAG HYDROXIDE-SIMETH 200-200-20 MG/5ML PO SUSP: 15.0000 mL | ORAL | Status: DC | PRN

## 2021-03-08 MED ORDER — MAGNESIUM SULFATE 2 GM/50ML IV SOLN
2.0000 g | Freq: Every day | INTRAVENOUS | Status: DC | PRN
Start: 2021-03-08 — End: 2021-03-12

## 2021-03-08 MED ORDER — HEPARIN SODIUM (PORCINE) 1000 UNIT/ML DIALYSIS
1000.0000 [IU] | INTRAMUSCULAR | Status: DC | PRN
  Administered 2021-03-08: 2800 [IU] via INTRAVENOUS_CENTRAL
  Administered 2021-03-09 (×2): 3000 [IU] via INTRAVENOUS_CENTRAL
  Filled 2021-03-08: qty 6
  Filled 2021-03-08 (×3): qty 3
  Filled 2021-03-08 (×3): qty 6

## 2021-03-08 MED ORDER — PROPOFOL 1000 MG/100ML IV EMUL
0.0000 ug/kg/min | INTRAVENOUS | Status: DC
  Administered 2021-03-08: 35 ug/kg/min via INTRAVENOUS
  Administered 2021-03-09: 40 ug/kg/min via INTRAVENOUS
  Administered 2021-03-09: 5 ug/kg/min via INTRAVENOUS
  Administered 2021-03-10: 15 ug/kg/min via INTRAVENOUS
  Administered 2021-03-10: 40 ug/kg/min via INTRAVENOUS
  Administered 2021-03-10: 15 ug/kg/min via INTRAVENOUS
  Filled 2021-03-08 (×3): qty 100
  Filled 2021-03-08: qty 1400
  Filled 2021-03-08 (×2): qty 100

## 2021-03-08 MED ORDER — SODIUM CHLORIDE 0.9 % FOR CRRT: INTRAVENOUS_CENTRAL | Status: DC | PRN

## 2021-03-08 MED ORDER — DOCUSATE SODIUM 100 MG PO CAPS: 100.0000 mg | ORAL_CAPSULE | Freq: Every day | ORAL | Status: DC

## 2021-03-08 MED ORDER — PROTAMINE SULFATE 10 MG/ML IV SOLN
INTRAVENOUS | Status: DC | PRN
  Administered 2021-03-08 (×5): 10 mg via INTRAVENOUS

## 2021-03-08 SURGICAL SUPPLY — 87 items
APL PRP STRL LF DISP 70% ISPRP (MISCELLANEOUS) ×2
BANDAGE ESMARK 6X9 LF (GAUZE/BANDAGES/DRESSINGS) IMPLANT
BENZOIN TINCTURE PRP APPL 2/3 (GAUZE/BANDAGES/DRESSINGS) ×6 IMPLANT
BNDG CMPR 9X6 STRL LF SNTH (GAUZE/BANDAGES/DRESSINGS) ×2
BNDG ESMARK 6X9 LF (GAUZE/BANDAGES/DRESSINGS) ×4
CANISTER SUCT 3000ML PPV (MISCELLANEOUS) ×4 IMPLANT
CANNULA VESSEL 3MM 2 BLNT TIP (CANNULA) ×2 IMPLANT
CATH EMB 4FR 80CM (CATHETERS) ×2 IMPLANT
CATH EMB 5FR 80CM (CATHETERS) ×2 IMPLANT
CHLORAPREP W/TINT 26 (MISCELLANEOUS) ×8 IMPLANT
CLIP VESOCCLUDE MED 24/CT (CLIP) ×4 IMPLANT
CLIP VESOCCLUDE SM WIDE 24/CT (CLIP) ×4 IMPLANT
CLOSURE WOUND 1/2 X4 (GAUZE/BANDAGES/DRESSINGS) ×2
COVER DOME SNAP 22 D (MISCELLANEOUS) ×4 IMPLANT
COVER WAND RF STERILE (DRAPES) ×2 IMPLANT
CUFF TOURN SGL QUICK 24 (TOURNIQUET CUFF) ×4
CUFF TOURN SGL QUICK 34 (TOURNIQUET CUFF)
CUFF TOURN SGL QUICK 42 (TOURNIQUET CUFF) IMPLANT
CUFF TRNQT CYL 24X4X16.5-23 (TOURNIQUET CUFF) IMPLANT
CUFF TRNQT CYL 34X4.125X (TOURNIQUET CUFF) IMPLANT
DERMABOND ADVANCED (GAUZE/BANDAGES/DRESSINGS) ×2
DERMABOND ADVANCED .7 DNX12 (GAUZE/BANDAGES/DRESSINGS) IMPLANT
DRAIN CHANNEL 15F RND FF W/TCR (WOUND CARE) IMPLANT
DRAIN PENROSE 1/4X12 LTX STRL (WOUND CARE) IMPLANT
DRAPE C-ARM 42X72 X-RAY (DRAPES) IMPLANT
DRAPE HALF SHEET 40X57 (DRAPES) IMPLANT
DRAPE U-SHAPE 47X51 STRL (DRAPES) ×2 IMPLANT
DRAPE X-RAY CASS 24X20 (DRAPES) IMPLANT
DRESSING PEEL AND PLC PRVNA 13 (GAUZE/BANDAGES/DRESSINGS) IMPLANT
DRSG PEEL AND PLACE PREVENA 13 (GAUZE/BANDAGES/DRESSINGS) ×4
ELECT REM PT RETURN 9FT ADLT (ELECTROSURGICAL) ×4
ELECTRODE REM PT RTRN 9FT ADLT (ELECTROSURGICAL) ×2 IMPLANT
EVACUATOR SILICONE 100CC (DRAIN) IMPLANT
FELT TEFLON 1X6 (MISCELLANEOUS) ×2 IMPLANT
FILTER SMOKE EVAC ULPA (FILTER) ×4 IMPLANT
GAUZE 4X4 16PLY RFD (DISPOSABLE) ×2 IMPLANT
GAUZE SPONGE 4X4 12PLY STRL (GAUZE/BANDAGES/DRESSINGS) ×4 IMPLANT
GLOVE SURG ENC MOIS LTX SZ8 (GLOVE) ×6 IMPLANT
GLOVE SURG SS PI 8.0 STRL IVOR (GLOVE) ×4 IMPLANT
GOWN STRL REUS W/ TWL LRG LVL3 (GOWN DISPOSABLE) ×4 IMPLANT
GOWN STRL REUS W/ TWL XL LVL3 (GOWN DISPOSABLE) ×2 IMPLANT
GOWN STRL REUS W/TWL LRG LVL3 (GOWN DISPOSABLE) ×8
GOWN STRL REUS W/TWL XL LVL3 (GOWN DISPOSABLE) ×4
GRAFT BALLN CATH 65CM (STENTS) IMPLANT
GRAFT VASC PATCH XENOSURE 1X14 (Vascular Products) ×2 IMPLANT
INSERT FOGARTY SM (MISCELLANEOUS) ×2 IMPLANT
KIT BASIN OR (CUSTOM PROCEDURE TRAY) ×4 IMPLANT
KIT DRSG PREVENA PLUS 7DAY 125 (MISCELLANEOUS) ×2 IMPLANT
KIT MICROPUNCTURE NIT STIFF (SHEATH) ×2 IMPLANT
KIT TURNOVER KIT B (KITS) ×4 IMPLANT
MARKER GRAFT CORONARY BYPASS (MISCELLANEOUS) IMPLANT
NS IRRIG 1000ML POUR BTL (IV SOLUTION) ×8 IMPLANT
PACK PERIPHERAL VASCULAR (CUSTOM PROCEDURE TRAY) ×4 IMPLANT
PAD ARMBOARD 7.5X6 YLW CONV (MISCELLANEOUS) ×8 IMPLANT
PENCIL SMOKE EVACUATOR (MISCELLANEOUS) ×4 IMPLANT
POWDER SURGICEL 3.0 GRAM (HEMOSTASIS) ×4 IMPLANT
SET COLLECT BLD 21X3/4 12 (NEEDLE) IMPLANT
SET WALTER ACTIVATION W/DRAPE (SET/KITS/TRAYS/PACK) ×2 IMPLANT
SPONGE LAP 18X18 RF (DISPOSABLE) ×2 IMPLANT
STAPLER VISISTAT 35W (STAPLE) ×2 IMPLANT
STENT GRAFT BALLN CATH 65CM (STENTS) ×4
STOPCOCK 4 WAY LG BORE MALE ST (IV SETS) IMPLANT
STRIP CLOSURE SKIN 1/2X4 (GAUZE/BANDAGES/DRESSINGS) ×8 IMPLANT
SUT ETHILON 2 0 PSLX (SUTURE) ×4 IMPLANT
SUT ETHILON 3 0 PS 1 (SUTURE) IMPLANT
SUT MNCRL AB 4-0 PS2 18 (SUTURE) ×12 IMPLANT
SUT PROLENE 4 0 RB 1 (SUTURE) ×4
SUT PROLENE 4-0 RB1 .5 CRCL 36 (SUTURE) IMPLANT
SUT PROLENE 5 0 C 1 24 (SUTURE) ×30 IMPLANT
SUT PROLENE 6 0 BV (SUTURE) ×16 IMPLANT
SUT SILK 2 0 SH (SUTURE) ×10 IMPLANT
SUT SILK 3 0 (SUTURE) ×4
SUT SILK 3-0 18XBRD TIE 12 (SUTURE) IMPLANT
SUT VIC AB 2-0 CT1 27 (SUTURE) ×12
SUT VIC AB 2-0 CT1 TAPERPNT 27 (SUTURE) ×4 IMPLANT
SUT VIC AB 3-0 SH 27 (SUTURE) ×12
SUT VIC AB 3-0 SH 27X BRD (SUTURE) ×4 IMPLANT
SYR 3ML LL SCALE MARK (SYRINGE) ×2 IMPLANT
TAPE UMBILICAL COTTON 1/8X30 (MISCELLANEOUS) IMPLANT
TOWEL GREEN STERILE (TOWEL DISPOSABLE) ×4 IMPLANT
TRAY FOLEY MTR SLVR 16FR STAT (SET/KITS/TRAYS/PACK) ×4 IMPLANT
TUBE CONNECTING 20'X1/4 (TUBING) ×1
TUBE CONNECTING 20X1/4 (TUBING) ×1 IMPLANT
UNDERPAD 30X36 HEAVY ABSORB (UNDERPADS AND DIAPERS) ×4 IMPLANT
WATER STERILE IRR 1000ML POUR (IV SOLUTION) ×4 IMPLANT
WIRE BENTSON .035X145CM (WIRE) ×2 IMPLANT
WIRE TORQFLEX AUST .018X40CM (WIRE) ×2 IMPLANT

## 2021-03-08 NOTE — Anesthesia Preprocedure Evaluation (Addendum)
Anesthesia Evaluation  Patient identified by MRN, date of birth, ID band Patient awake    Reviewed: Allergy & Precautions, NPO status , Patient's Chart, lab work & pertinent test results  Airway Mallampati: II  TM Distance: >3 FB Neck ROM: Full    Dental  (+) Edentulous Upper, Dental Advisory Given   Pulmonary sleep apnea and Continuous Positive Airway Pressure Ventilation , Patient abstained from smoking., former smoker,    breath sounds clear to auscultation       Cardiovascular hypertension, Pt. on medications + CAD and + Peripheral Vascular Disease   Rhythm:Regular Rate:Normal     Neuro/Psych Anxiety negative neurological ROS     GI/Hepatic negative GI ROS, Neg liver ROS,   Endo/Other  diabetes, Type 2Hypothyroidism   Renal/GU Dialysis and ESRFRenal disease (II)   Prostate Ca    Musculoskeletal  (+) Arthritis , Osteoarthritis,    Abdominal   Peds  Hematology  (+) anemia ,   Anesthesia Other Findings   Reproductive/Obstetrics                            Anesthesia Physical Anesthesia Plan  ASA: III  Anesthesia Plan: General   Post-op Pain Management:    Induction: Intravenous  PONV Risk Score and Plan: 2 and Ondansetron, Dexamethasone and Treatment may vary due to age or medical condition  Airway Management Planned: Mask and Oral ETT  Additional Equipment: Arterial line  Intra-op Plan:   Post-operative Plan: Extubation in OR  Informed Consent: I have reviewed the patients History and Physical, chart, labs and discussed the procedure including the risks, benefits and alternatives for the proposed anesthesia with the patient or authorized representative who has indicated his/her understanding and acceptance.     Dental advisory given  Plan Discussed with:   Anesthesia Plan Comments: (Lab Results      Component                Value               Date                       WBC                      5.4                 03/20/2021                HGB                      8.0 (L)             03/24/2021                HCT                      25.8 (L)            03/27/2021                MCV                      92.1                03/17/2021                PLT  244                 03/29/2021           Lab Results      Component                Value               Date                      NA                       137                 03/12/2021                K                        4.3                 03/28/2021                CO2                      27                  03/23/2021                GLUCOSE                  191 (H)             03/22/2021                BUN                      52 (H)              03/30/2021                CREATININE               8.41 (H)            03/21/2021                CALCIUM                  9.2                 03/21/2021                GFRNONAA                 6 (L)               03/15/2021                GFRAA                    5 (L)               11/16/2019            ECHO 02/21: 1. Left ventricular ejection fraction, by estimation, is 30 to 35%. The  left ventricle has moderately decreased function. The left ventricle  demonstrates global hypokinesis. There is mild left ventricular  hypertrophy. Left ventricular diastolic  parameters are consistent with Grade I diastolic dysfunction (impaired  relaxation).  There is mild hypokinesis of the left ventricular, entire  inferolateral wall and inferoseptal wall.  2. Right ventricular systolic function is normal. The right ventricular  size is normal.  3. Left atrial size was severely dilated.  4. The mitral valve is normal in structure and function. Trivial mitral  valve regurgitation.  5. The aortic valve is tricuspid. Aortic valve regurgitation is not  visualized. No aortic stenosis is present.  6. The inferior vena cava is normal in size  with greater than 50%  respiratory variability, suggesting right atrial pressure of 3 mmHg. )       Anesthesia Quick Evaluation

## 2021-03-08 NOTE — Transfer of Care (Signed)
Immediate Anesthesia Transfer of Care Note  Patient: Michael Blackwell  Procedure(s) Performed: RIGHT ILIOFEMORAL ENDARTERECTOMY  AND RIGHT FEMORAL-POPLITEAL ARTERY BYPASS GRAFT (Right ) RIGHT LOWER EXTREMITY ANGIOGRAM (Right Groin) PATCH ANGIOPLASTY OF RIGHT FEMORAL ARTERY USING XENOSURE BIOLOGIC PATCH (Right Groin)  Patient Location: ICU  Anesthesia Type:General  Level of Consciousness: Patient remains intubated per anesthesia plan  Airway & Oxygen Therapy: Patient remains intubated per anesthesia plan and Patient placed on Ventilator (see vital sign flow sheet for setting)  Post-op Assessment: Report given to RN and Post -op Vital signs reviewed and stable  Post vital signs: Reviewed and stable  Last Vitals:  Vitals Value Taken Time  BP 156/88   Temp    Pulse 85 03/21/2021 1923  Resp 15 03/30/2021 1925  SpO2 96 % 03/30/2021 1923  Vitals shown include unvalidated device data.  Last Pain:  Vitals:   03/04/2021 1057  TempSrc: Oral  PainSc:          Complications: No complications documented.

## 2021-03-08 NOTE — Progress Notes (Signed)
Direct admit from home to [address],alert oriented accompanied by wife.V/S check,attached to cardiac monitoring,CCMD notified,oriented to the room.No complain of pain,right leg and right groin are swelling on assessment.Will continue to monitor patient.

## 2021-03-08 NOTE — Progress Notes (Signed)
Contacted Otelia Santee, MD to verify patient fluid removal.  MD states to keep patient even and if levo requirements increase, run patient positive.

## 2021-03-08 NOTE — Anesthesia Postprocedure Evaluation (Signed)
Anesthesia Post Note  Patient: BASTIEN STRAWSER  Procedure(s) Performed: RIGHT ILIOFEMORAL ENDARTERECTOMY  AND RIGHT FEMORAL-POPLITEAL ARTERY BYPASS GRAFT (Right ) RIGHT LOWER EXTREMITY ANGIOGRAM (Right Groin) PATCH ANGIOPLASTY OF RIGHT FEMORAL ARTERY USING XENOSURE BIOLOGIC PATCH (Right Groin)     Patient location during evaluation: SICU Anesthesia Type: General Level of consciousness: sedated Pain management: pain level controlled Vital Signs Assessment: post-procedure vital signs reviewed and stable Respiratory status: patient remains intubated per anesthesia plan Cardiovascular status: unstable Postop Assessment: no apparent nausea or vomiting Anesthetic complications: no   No complications documented.  Last Vitals:  Vitals:   03/09/2021 1057 03/18/2021 1905  BP: (!) 118/51   Pulse: 72 88  Resp: 17 16  Temp: 37 C   SpO2: 95% 100%    Last Pain:  Vitals:   03/28/2021 1057  TempSrc: Oral  PainSc:                  Mohini Heathcock,W. EDMOND

## 2021-03-08 NOTE — Op Note (Signed)
DATE OF SERVICE: 03/14/2021  PATIENT:  Michael Blackwell  80 y.o. male  PRE-OPERATIVE DIAGNOSIS:  Atherosclerosis of native arteries of right lower extremity causing ulceration; status post right iliofemoral endarterectomy 03/01/21.  POST-OPERATIVE DIAGNOSIS:  Same  PROCEDURE:   1) right re-do groin exploration and evacuation of seroma 2) Re-do right iliofemoral endarterectomy 3) Right greater saphenous vein harvest 4) Right femoral to below knee popliteal artery bypass with ipsilateral, translocated, non-reversed, greater saphenous vein 5) right lower extremity angiography  SURGEON:  Surgeon(s) and Role:    * Michael Robins, MD - Primary    Michael Heck, MD - Assisting  ASSISTANT: Michael Fruit, PA-C  An assistant was required to facilitate exposure and expedite the case.  ANESTHESIA:   general  EBL: 2049mL  BLOOD ADMINISTERED:3 units CC PRBC  DRAINS: none   LOCAL MEDICATIONS USED:  NONE  SPECIMEN:  none  COUNTS: confirmed correct.  TOURNIQUET:    Total Tourniquet Time Documented: Thigh (Right) - 17 minutes Total: Thigh (Right) - 17 minutes   PATIENT DISPOSITION:  ICU - intubated and hemodynamically stable.   Delay start of Pharmacological VTE agent (>24hrs) due to surgical blood loss or risk of bleeding: no  INDICATION FOR PROCEDURE: Michael Blackwell is a 80 y.o. male who recently underwent right iliofemoral endarterectomy 03/01/21.  He returned to the office 03/14/2021 complaining of fullness of his incision site.  He had developed duskiness of his right great toe with an interdigital ulceration.  An ABI performed in the clinic revealed an ABI of 0.0 bilaterally.  I directly admitted the patient to the hospital and obtain a CT angiogram of his abdomen pelvis with runoff.  This should severe stenosis of the external iliac artery on the right immediately above the patch angioplasty. After careful discussion of risks, benefits, and alternatives the patient was offered  redo endarterectomy and right femoropopliteal bypass. The patient understood and wished to proceed.  OPERATIVE FINDINGS: Very challenging exposure of the mid right external iliac artery through the previous transverse groin incision.  Balloon occlusion was required.  Significant blood loss occurred during this endeavor.  Ultimately a bovine pericardial patch angioplasty was performed after excising the previous vein patch.  Greater saphenous vein appeared healthy throughout the course of the leg and was harvested for bypass conduit.  An ipsilateral, nonreversed, translocated greater saphenous vein femoral to below-knee popliteal artery bypass was done without difficulty.  Completion angiogram showed resolution of the inflow stenosis.  Posterior tibial signal was poor at completion of case.  Angiography revealed no technical defects with the distal anastomosis and flow through the tibioperoneal trunk into the posterior tibial and peroneal arteries.  DESCRIPTION OF PROCEDURE: After identification of the patient in the pre-operative holding area, the patient was transferred to the operating room. The patient was positioned supine on the operating room table. Anesthesia was induced. The leg was prepped and draped in standard fashion. A surgical pause was performed confirming correct patient, procedure, and operative location.  The previous groin incision was reopened sharply.  A large volume of seroma was encountered and evacuated.  There is no evidence of infection.  We explored the surgical bed and found no issues with the repair.  I manually palpated the external iliac artery high up into the pelvis and found a shelf of plaque.  An on table angiogram was performed and confirmed the plaque was flow-limiting.  The plaque appeared closer to our patch than it actually was and I elected to  treat this with open technique instead of stenting.  We carried our exposure cranially on the external iliac artery and divided  the circumflex vein overlying the external iliac artery.    Patient was systemically heparinized.  ACT monitoring was used throughout the case to confirm adequate anticoagulation.  Michael Blackwell clamp was applied to the mid external iliac artery.  A Michael Blackwell clamp was applied to the profunda femoris artery.  The artery was opened.  The shelf of plaque was identified endarterectomized.  It was incredibly difficult to control the inflow after endarterectomy.  Ultimately balloon occlusion was successful.  The endarterectomy was completed.  A 1 x 14 cm bovine pericardial patch was brought in the field and sewn into the endarterectomy plane as a patch angioplasty.  Immediately prior to completion the patch was flushed and de-aired.  Several areas of bleeding were repaired with repair stitches.  This rendered the patch hemostatic.  Using a medial calf incision the below-knee popliteal artery was exposed using standard technique.  Incision was carried down through subtenons tissue until the fascia of the superficial posterior compartment was encountered and divided.  The gastrocnemius was posteriorly.  The popliteal vascular bundle was identified and skeletonized.  Using skip incisions throughout the medial thigh the right greater saphenous vein was harvested using standard technique.  Sidebranches, when identified were doubly ligated and divided.  The vein was ligated proximally and distally and excised.  The vein was passed off to the back table and prepared for bypass creation.  It distended nicely when pressurized with heparinized saline solution.  All leaking points were repaired primarily with 4-0 silk suture.  A anatomic tunnel was created between the 2 vascular exposures using a sheath tunnel device.  A Cooley clamp was then placed across the patch angioplasty and a small longitudinal arteriotomy was made in the distal patch.  The proximal greater saphenous vein was spatulated and then sewn end-to-side to this  arteriotomy using continuous running suture of 6-0 Prolene.  The anastomosis was completed and flushed down the bypass.  A Mills valvulotome was brought onto the field and used to perform valve lysis of the bypass conduit.  Excellent, pulsatile flow was achieved through the bypass after lysis.  The bypass conduit was pressurized and the anterior surface marked.  The bypass was delivered through the sheath tunneling device taking great care to avoid twisting or kinking the graft.  The graft was noted to have a palpable pulse in the distal popliteal exposure.  A pneumatic tourniquet was brought onto the field and applied to the thigh.  An Esmarch tourniquet was used to exsanguinate the leg.  The pneumatic tourniquet was inflated.  An anterior arteriotomy was made in the popliteal artery.  The vein was cut to size and spatulated to allow end-to-side anastomosis to the arteriotomy without redundancy or tension.  A focal popliteal endarterectomy was performed because we encountered fractured plaque in the below-knee popliteal artery.  End-to-side anastomosis was performed using standard technique using continuous running suture of 6-0 Prolene.  Immediately prior to completion the pneumatic tourniquet was released and a bulldog clamp applied to the bypass graft.  The anastomosis was flushed and de-aired.  The anastomosis was completed.  The bulldog clamp was released.  Excellent flow was noted through the bypass graft.  A palpable popliteal pulse was identified.  Weak flow was noted in the posterior tibial artery.  I elected to perform on table angiogram.  This revealed no technical defects with the bypass graft.  Runoff was noted through the anastomosis into the tibioperoneal trunk and into the posterior tibial artery and peroneal artery.  The tibial arteries were diffusely diseased but coursed to the ankle.  Heparin was reversed with protamine.  Hemostasis was achieved in the surgical beds.  The wounds were closed  in layers using 2-0 Vicryl, 3-0 Vicryl, 4 Monocryl.  The groin was closed superficially using 2-0 nylon and surgical staplers.  A Prevena VAC was applied to the groin.  Dermabond was applied to the remainder the incisions.  The patient was left intubated given the resuscitation requirements in this patient with end-stage renal disease.  We will transfer him to the ICU and monitor him carefully.  Critical care and nephrology consults will be obtained.  Yevonne Aline. Stanford Breed, MD [facility] Phone Number: [phone] 03/14/2021 6:19 PM

## 2021-03-08 NOTE — Progress Notes (Addendum)
Progress Note    03/10/2021 7:02 AM   Subjective:  Says his feet feel better since getting blood thinner  afebrile  Vitals:   03/24/2021 2348 03/14/2021 0400  BP: (!) 109/48 (!) 107/48  Pulse: 75 72  Resp: 14 18  Temp: 98.6 F (37 C) 98.8 F (37.1 C)  SpO2: 94% 96%    Physical Exam: Cardiac:  regular Lungs:  Non labored Extremities:  +left DP doppler and +right PT doppler signals Incision:  Right groin incision is clean with hematoma  CBC    Component Value Date/Time   WBC 7.1 03/20/2021 2112   RBC 3.02 (L) 03/17/2021 2112   HGB 8.7 (L) 03/18/2021 2112   HGB 10.5 (L) 03/01/2016 1120   HCT 28.0 (L) 03/04/2021 2112   HCT 32.5 (L) 03/01/2016 1120   PLT 263 03/30/2021 2112   PLT 254 03/01/2016 1120   MCV 92.7 03/19/2021 2112   MCV 82.5 03/01/2016 1120   MCH 28.8 03/09/2021 2112   MCHC 31.1 03/08/2021 2112   RDW 16.4 (H) 03/05/2021 2112   RDW 16.3 (H) 03/01/2016 1120   LYMPHSABS 1.1 02/27/2021 1811   LYMPHSABS 1.8 03/01/2016 1120   MONOABS 0.6 02/27/2021 1811   MONOABS 0.4 03/01/2016 1120   EOSABS 0.1 02/27/2021 1811   EOSABS 0.1 03/01/2016 1120   BASOSABS 0.1 02/27/2021 1811   BASOSABS 0.0 03/01/2016 1120    BMET    Component Value Date/Time   NA 137 03/04/2021 2112   NA 141 03/01/2016 1120   K 4.3 03/06/2021 2112   K 3.6 03/01/2016 1120   CL 93 (L) 03/30/2021 2112   CO2 27 03/22/2021 2112   CO2 25 03/01/2016 1120   GLUCOSE 191 (H) 03/30/2021 2112   GLUCOSE 151 (H) 03/01/2016 1120   BUN 52 (H) 03/14/2021 2112   BUN 17.1 03/01/2016 1120   CREATININE 8.41 (H) 03/17/2021 2112   CREATININE 1.5 (H) 03/01/2016 1120   CALCIUM 9.2 03/01/2021 2112   CALCIUM 9.2 09/08/2019 0823   CALCIUM 9.2 03/01/2016 1120   GFRNONAA 6 (L) 03/28/2021 2112   GFRAA 5 (L) 11/16/2019 0252    INR    Component Value Date/Time   INR 1.2 03/02/2021 2112    CTA with runoff 03/25/2021: IMPRESSION: VASCULAR  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  Bilateral  focal dissection flaps within the a mid renal arteries resulting in roughly 50% stenoses of these vessels.  Focal dissection flap within the right common iliac artery which does not appear flow limiting within the common iliac artery, but terminates within the origin of the right internal iliac artery and results in a focal high-grade stenosis of this vessel at its origin.  Right common femoral artery stenosis of 60-70% above the level ofendarterectomy. Right 6 SFA is thrombosed. Profundus femoral arterydemonstrates a a greater than 75% stenosis at its origin butultimately reconstitutes the right lower extremity runoff.  Multilevel high-grade stenosis of the popliteal artery. Posterior tibial artery and peroneal arteries formed 2 vessel runoff to the right foot with patency of the dorsalis pedis and plantar arch.  Focal 75% stenosis of the left external iliac artery secondary to soft plaque.  Focal 50-75% stenosis of the left common femoral artery. Focal occlusion of the SFA at its origin with multifocal stenosis distally throughout its course. Profundus femoral artery origin stenosis by50%.  Extensive left lower extremity runoff disease with multifocal hemodynamically significant stenosis of the P1 and P2 segments. Variant anatomy with high takeoff of the  anterior tibial artery proximal to a 50% stenosis of the tibioperoneal trunk. Two vessel runoff to the left foot patency of the dorsalis pedis and plantar arch.  NON-VASCULAR  9.9 cm hematoma within the right groin surrounding the site of endarterectomy.  Low-attenuation cortical lesion within the interpolar region of the right kidney, not well characterized on this examination. This couldbe better assessed with dedicated sonography as an initial step  Assessment/Plan:  80 y.o. male is s/p:  extended right ilio femoral endarterectomy readmitted with ABI of zero.   -CTA yesterday revealed extensive disease.  Dr.  Stanford Breed to see pt this am to determine intervention.  Keep npo until Dr. Stanford Breed sees pt.  -DVT prophylaxis:  Heparin gtt at this time   Leontine Locket, PA-C [facility] [phone] 03/12/2021 7:02 AM  VASCULAR STAFF ADDENDUM: I have independently interviewed and examined the patient. I agree with the above.  CTA and most recent angiogram reviewed in detail. Proximal to the endarterectomy, there is a focal stenosis. 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 Just proximal to the bifurcation of the profunda femoris, there is a severe stenosis which appears similar to previous angiography. The patient now has tissue loss about his right great toe and needs in-line flow. I think the safest, least complicated approach would be to extend the femoral endarterectomy proximally and create a fem-pop bypass to the below knee popliteal artery.  We will plan to do this today. Keep NPO.  Yevonne Aline. Stanford Breed, MD [facility] Phone Number: [phone] 03/21/2021 8:35 AM

## 2021-03-08 NOTE — Progress Notes (Signed)
ANTICOAGULATION CONSULT NOTE - Initial Consult  Pharmacy Consult for IV heparin Indication: ischemic limb  No Known Allergies  Patient Measurements: Height: 5\' 10"  (177.8 cm) Weight: 78.7 kg (173 lb 6.4 oz) IBW/kg (Calculated) : 73 Heparin Dosing Weight: 79 kg  Vital Signs: Temp: 98.5 F (36.9 C) (06/08 0713) Temp Source: Oral (06/08 0713) BP: 126/52 (06/08 0713) Pulse Rate: 72 (06/08 0713)  Labs: Recent Labs    03/10/2021 2112 03/15/2021 0649  HGB 8.7* 8.0*  HCT 28.0* 25.8*  PLT 263 244  LABPROT 14.9  --   INR 1.2  --   HEPARINUNFRC  --  0.21*  CREATININE 8.41*  --     Estimated Creatinine Clearance: 7.6 mL/min (A) (by C-G formula based on SCr of 8.41 mg/dL (H)).   Medical History: Past Medical History:  Diagnosis Date  . Acute respiratory failure with hypoxia ([place]) 12/05/2016   [name] 12/05/2016  . Anemia   . Anxiety   . Arthritis    "joints ache at night sometimes" (3/8/20180  . Chronic kidney disease (CKD), stage II (mild)    Acute on chronic kidney disease stage II-III/notes 12/06/2016  . Dyspnea    with exertion  . ESRD (end stage renal disease) ([place])    Hemo TTSAT [place]  . Heart murmur    never has caused any problems per patient  . History of blood transfusion   . History of radiation therapy 02/25/14- 04/23/14   prostate 7800 cGy 40 sessions, seminal vesicles 5600 cGy 40 sessions  . Hypercholesteremia   . Hypertension   . OSA on CPAP   . Pneumonia 12/06/2016  . Prostate cancer ([place]) 11/19/13   gleason 4+3=7, volume 34.74 cc  . Spermatocele    right  . Type II diabetes mellitus (H[facility])    diet controlled  . Wears partial dentures    upper    Medications:  Infusions:  . sodium chloride    . heparin 1,000 Units/hr (03/21/2021 2255)    Assessment: 73 you male with ischemic limb.  No anticoagulants noted PTA.  Pharmacy asked to begin IV heparin tonight.    VVS to assess for likely intervention today. HL came back subtherapeutic this AM. We  will increase rate and recheck in 8 hr or post possible intervention  Goal of Therapy:  Heparin level 0.3-0.7 units/ml Monitor platelets by anticoagulation protocol: Yes   Plan:   Increase heparin to 1150 units/hr Check 8 hr HL or post possible intervention Daily HL and CBC  Onnie Boer, PharmD, BCIDP, AAHIVP, CPP Infectious Disease Pharmacist 03/15/2021 8:19 AM

## 2021-03-08 NOTE — Anesthesia Procedure Notes (Signed)
Procedure Name: Intubation Date/Time: 03/19/2021 12:40 PM Performed by: Mariea Clonts, CRNA Pre-anesthesia Checklist: Patient identified, Emergency Drugs available, Suction available and Patient being monitored Patient Re-evaluated:Patient Re-evaluated prior to induction Oxygen Delivery Method: Circle System Utilized Preoxygenation: Pre-oxygenation with 100% oxygen Induction Type: IV induction Ventilation: Mask ventilation without difficulty and Oral airway inserted - appropriate to patient size Laryngoscope Size: Sabra Heck and 2 Grade View: Grade I Tube type: Oral Tube size: 7.5 mm Number of attempts: 1 Airway Equipment and Method: Stylet and Oral airway Placement Confirmation: ETT inserted through vocal cords under direct vision,  positive ETCO2 and breath sounds checked- equal and bilateral Tube secured with: Tape Dental Injury: Teeth and Oropharynx as per pre-operative assessment

## 2021-03-08 NOTE — Progress Notes (Signed)
Patients blood pressure reading is ranging from 66K to 15T systolic pressure,patients is drowsy(sleepy) told me that it is maybe due to the pain medication he took before going to the hospital.Dr. Oneida Alar made aware with order.NS 1000 cc bolus given.Will continue to closely monitor the patient.

## 2021-03-08 NOTE — Procedures (Signed)
Central Venous Catheter Insertion Procedure Note  Michael Blackwell  [id number]  [birth year]  Date:03/17/2021  Time:8:20 PM   Provider Performing:Roylee Chaffin Shearon Stalls   Procedure: Insertion of Non-tunneled Central Venous Catheter(36556)with US guidance (12162)    Indication(s) Hemodialysis  Consent Unable to obtain consent due to emergent nature of procedure.  Anesthesia Topical only with 1% lidocaine   Timeout Verified patient identification, verified procedure, site/side was marked, verified correct patient position, special equipment/implants available, medications/allergies/relevant history reviewed, required imaging and test results available.  Sterile Technique Maximal sterile technique including full sterile barrier drape, hand hygiene, sterile gown, sterile gloves, mask, hair covering, sterile ultrasound probe cover (if used).  Procedure Description Area of catheter insertion was cleaned with chlorhexidine and draped in sterile fashion.   With real-time ultrasound guidance a HD catheter was placed into the left internal jugular vein.  Nonpulsatile blood flow and easy flushing noted in all ports.  The catheter was sutured in place and sterile dressing applied.  Complications/Tolerance None; patient tolerated the procedure well. Chest X-ray is ordered to verify placement for internal jugular or subclavian cannulation.  Chest x-ray is not ordered for femoral cannulation.  EBL Minimal  Specimen(s) None   Montey Hora, PA - C [facility] For pager details, please see AMION or use Epic chat  After 1900, please call [facility] for cross coverage needs 03/26/2021, 8:20 PM

## 2021-03-08 NOTE — Consult Note (Signed)
Reason for Consult: ESRD Referring Physician: Dr. Jamelle Haring  Chief Complaint:  Right foot pain   Outpatient Dialysis Orders:  Center: [facility]  on MWF. 180NRe, 4 hours, BFR 400, DFR 500, EDW 77kg, 2K/2Ca, AVG 15g Heparin 2300 unit bolus Mircera 50 mcg IV q 4 weeks Calcitriol 0.18mcg PO q HD Velphoro 1 tab PO TID with meals  Home BP meds: amlodipine 10mg  QHS, clonidine 0.1mg  1 tab QAM and 2 tabs QHS, hydralazine 100mg  BID  Assessment/Plan: 1.  PAD: s/p right fem pop bypass 03/16/2021 and exploration of right groin exploration with evacuation of seroma. 2. ?Sepsis: Hypotensive upon arrival to ICU. 3.  ESRD:  On MWF schedule, with last HD Monday; will need catheter for CRRT. He will not be able to tolerate iHD. Will start CRRT with 4K bath once a trialysis catheter is in place. Appreciate CCM placing the catheter. 4. Hypotension ion/volume: Hold all anti hypertensive regimen for now. 5.  Anemia: Hgb at goal. No ESA indicated at this time. 6.  Metabolic bone disease: Calcium controlled. Continue calcitriol and velphoro once eating; will check a  phos level.  7.  Nutrition:  Currently NPO 8. T2DM: diet controlled  HPI: Michael Blackwell is an 80 y.o. male ESRD on HD, PAD, HTN, HLD, DM, OSA on CPAP, h/o prostate cancer presenting with right foot pain for many weeks. He was noted to have a temperature of 100.1 in the ED, tachycardic and hypotensive. Negative COVID and influenza with no infiltrates noted on CXR. The pain in the foot has made it difficult for him to walk; he denies CP/ dyspnea/ cough, fever, chills, abdominal pain or diarrhea. He is a MWF HD patient   ROS Pertinent items are noted in HPI.  Chemistry and CBC: Creatinine  Date/Time Value Ref Range Status  03/01/2016 11:20 AM 1.5 (H) 0.7 - 1.3 mg/dL Final   Creatinine, Ser  Date/Time Value Ref Range Status  03/16/2021 09:12 PM 8.41 (H) 0.61 - 1.24 mg/dL Final  03/02/2021 06:46 AM 4.59 (H) 0.61 - 1.24 mg/dL Final     Comment:    DIALYSIS  03/01/2021 07:08 PM 8.91 (H) 0.61 - 1.24 mg/dL Final  03/01/2021 01:42 AM 8.40 (H) 0.61 - 1.24 mg/dL Final  02/27/2021 06:11 PM 6.10 (H) 0.61 - 1.24 mg/dL Final  07/15/2020 08:19 PM 13.29 (H) 0.61 - 1.24 mg/dL Final  07/11/2020 09:39 AM 11.80 (H) 0.61 - 1.24 mg/dL Final  12/30/2019 06:34 AM 5.00 (H) 0.61 - 1.24 mg/dL Final  11/16/2019 02:52 AM 9.66 (H) 0.61 - 1.24 mg/dL Final  11/15/2019 08:22 AM 9.39 (H) 0.61 - 1.24 mg/dL Final  11/14/2019 10:29 PM 9.15 (H) 0.61 - 1.24 mg/dL Final  11/03/2019 08:44 AM 8.62 (H) 0.61 - 1.24 mg/dL Final  10/06/2019 09:20 AM 8.02 (H) 0.61 - 1.24 mg/dL Final  09/08/2019 09:00 AM 7.50 (H) 0.61 - 1.24 mg/dL Final  08/11/2019 08:49 AM 7.05 (H) 0.61 - 1.24 mg/dL Final  07/14/2019 09:03 AM 6.49 (H) 0.61 - 1.24 mg/dL Final  06/02/2019 09:02 AM 6.29 (H) 0.61 - 1.24 mg/dL Final  04/06/2019 10:40 AM 6.48 (H) 0.61 - 1.24 mg/dL Final  04/06/2019 08:52 AM 6.77 (H) 0.61 - 1.24 mg/dL Final  03/09/2019 08:42 AM 6.12 (H) 0.61 - 1.24 mg/dL Final  02/09/2019 08:09 AM 5.56 (H) 0.61 - 1.24 mg/dL Final  12/10/2016 01:58 AM 2.72 (H) 0.61 - 1.24 mg/dL Final  12/09/2016 04:43 AM 2.85 (H) 0.61 - 1.24 mg/dL Final  12/08/2016 03:23 AM 2.75 (H) 0.61 -  1.24 mg/dL Final  12/07/2016 03:21 AM 2.46 (H) 0.61 - 1.24 mg/dL Final  12/06/2016 08:22 AM 2.30 (H) 0.61 - 1.24 mg/dL Final  12/06/2016 12:20 AM 2.30 (H) 0.61 - 1.24 mg/dL Final  12/06/2016 12:06 AM 2.26 (H) 0.61 - 1.24 mg/dL Final  12/05/2016 07:34 PM 2.26 (H) 0.61 - 1.24 mg/dL Final  11/22/2016 01:04 PM 1.89 (H) 0.61 - 1.24 mg/dL Final   Recent Labs  Lab 03/02/21 0646 03/26/2021 2112  NA 137 137  K 3.4* 4.3  CL 97* 93*  CO2 29 27  GLUCOSE 83 191*  BUN 21 52*  CREATININE 4.59* 8.41*  CALCIUM 8.8* 9.2   Recent Labs  Lab 03/02/21 0646 03/16/2021 2112 03/07/2021 0649  WBC 7.9 7.1 5.4  HGB 9.7* 8.7* 8.0*  HCT 29.5* 28.0* 25.8*  MCV 89.9 92.7 92.1  PLT 155 263 244   Liver Function Tests: Recent  Labs  Lab 03/04/2021 2112  AST 17  ALT 5  ALKPHOS 54  BILITOT 0.4  PROT 6.5  ALBUMIN 2.8*   No results for input(s): LIPASE, AMYLASE in the last 168 hours. No results for input(s): AMMONIA in the last 168 hours. Cardiac Enzymes: No results for input(s): CKTOTAL, CKMB, CKMBINDEX, TROPONINI in the last 168 hours. Iron Studies: No results for input(s): IRON, TIBC, TRANSFERRIN, FERRITIN in the last 72 hours. PT/INR: @LABRCNTIP (inr:5)  Xrays/Other Studies: ) Results for orders placed or performed during the hospital encounter of 03/30/2021 (from the past 48 hour(s))  CBC     Status: Abnormal   Collection Time: 03/18/2021  9:12 PM  Result Value Ref Range   WBC 7.1 4.0 - 10.5 K/uL   RBC 3.02 (L) 4.22 - 5.81 MIL/uL   Hemoglobin 8.7 (L) 13.0 - 17.0 g/dL   HCT 28.0 (L) 39.0 - 52.0 %   MCV 92.7 80.0 - 100.0 fL   MCH 28.8 26.0 - 34.0 pg   MCHC 31.1 30.0 - 36.0 g/dL   RDW 16.4 (H) 11.5 - 15.5 %   Platelets 263 150 - 400 K/uL   nRBC 0.0 0.0 - 0.2 %    Comment: Performed at [facility], [address] [place], [place] [address]  Comprehensive metabolic panel     Status: Abnormal   Collection Time: 03/19/2021  9:12 PM  Result Value Ref Range   Sodium 137 135 - 145 mmol/L   Potassium 4.3 3.5 - 5.1 mmol/L   Chloride 93 (L) 98 - 111 mmol/L   CO2 27 22 - 32 mmol/L   Glucose, Bld 191 (H) 70 - 99 mg/dL    Comment: Glucose reference range applies only to samples taken after fasting for at least 8 hours.   BUN 52 (H) 8 - 23 mg/dL   Creatinine, Ser 8.41 (H) 0.61 - 1.24 mg/dL   Calcium 9.2 8.9 - 10.3 mg/dL   Total Protein 6.5 6.5 - 8.1 g/dL   Albumin 2.8 (L) 3.5 - 5.0 g/dL   AST 17 15 - 41 U/L   ALT 5 0 - 44 U/L   Alkaline Phosphatase 54 38 - 126 U/L   Total Bilirubin 0.4 0.3 - 1.2 mg/dL   GFR, Estimated 6 (L) >60 mL/min    Comment: (NOTE) Calculated using the CKD-EPI Creatinine Equation (2021)    Anion gap 17 (H) 5 - 15    Comment: Performed at [facility], [place] [address] [place], [place] [address]  Protime-INR     Status: None   Collection Time:  03/13/2021  9:12 PM  Result Value Ref Range   Prothrombin Time 14.9 11.4 - 15.2 seconds   INR 1.2 0.8 - 1.2    Comment: (NOTE) INR goal varies based on device and disease states. Performed at [facility], [place] [address] [place], [place] [address]   SARS CORONAVIRUS 2 (TAT 6-24 HRS) Nasopharyngeal Nasopharyngeal Swab     Status: None   Collection Time: 03/29/2021 10:36 PM   Specimen: Nasopharyngeal Swab  Result Value Ref Range   SARS Coronavirus 2 NEGATIVE NEGATIVE    Comment: (NOTE) SARS-CoV-2 target nucleic acids are NOT DETECTED.  The SARS-CoV-2 RNA is generally detectable in upper and lower respiratory specimens during the acute phase of infection. Negative results do not preclude SARS-CoV-2 infection, do not rule out co-infections with other pathogens, and should not be used as the sole basis for treatment or other patient management decisions. Negative results must be combined with clinical observations, patient history, and epidemiological information. The expected result is Negative.  Fact Sheet for Patients: SugarRoll.be  Fact Sheet for Healthcare Providers: https://www.woods-mathews.com/  This test is not yet approved or cleared by the [place] FDA and  has been authorized for detection and/or diagnosis of SARS-CoV-2 by FDA under an Emergency Use Authorization (EUA). This EUA will remain  in effect (meaning this test can be used) for the duration of the COVID-19 declaration under Se ction 564(b)(1) of the Act, 21 U.S.C. section 360bbb-3(b)(1), unless the authorization is terminated or revoked sooner.  Performed at [facility], [place] [address], [place], [place] [address]   Heparin level (unfractionated)     Status: Abnormal   Collection Time: 03/09/2021  6:49 AM  Result Value Ref Range   Heparin Unfractionated 0.21 (L) 0.30 - 0.70  IU/mL    Comment: (NOTE) The clinical reportable range upper limit is being lowered to >1.10 to align with the FDA approved guidance for the current laboratory assay.  If heparin results are below expected values, and patient dosage has  been confirmed, suggest follow up testing of antithrombin III levels. Performed at [facility], [place] [address] [place], [place] [address]   CBC     Status: Abnormal   Collection Time: 03/22/2021  6:49 AM  Result Value Ref Range   WBC 5.4 4.0 - 10.5 K/uL   RBC 2.80 (L) 4.22 - 5.81 MIL/uL   Hemoglobin 8.0 (L) 13.0 - 17.0 g/dL   HCT 25.8 (L) 39.0 - 52.0 %   MCV 92.1 80.0 - 100.0 fL   MCH 28.6 26.0 - 34.0 pg   MCHC 31.0 30.0 - 36.0 g/dL   RDW 16.4 (H) 11.5 - 15.5 %   Platelets 244 150 - 400 K/uL   nRBC 0.0 0.0 - 0.2 %    Comment: Performed at [facility], [place] [address] [place], [place] [address]  Glucose, capillary     Status: Abnormal   Collection Time: 03/06/2021 10:55 AM  Result Value Ref Range   Glucose-Capillary 114 (H) 70 - 99 mg/dL    Comment: Glucose reference range applies only to samples taken after fasting for at least 8 hours.  Type and screen [place]     Status: None (Preliminary result)   Collection Time: 03/03/2021 12:18 PM  Result Value Ref Range   ABO/RH(D) A POS    Antibody Screen NEG    Sample Expiration Mar 27, 2021,2359    Unit Number [id number]    Blood Component Type  RED CELLS,LR    Unit division 00    Status of Unit ISSUED    Transfusion Status OK TO TRANSFUSE    Crossmatch Result Compatible    Unit Number [id number]    Blood Component Type RED CELLS,LR    Unit division 00    Status of Unit ISSUED    Transfusion Status OK TO TRANSFUSE    Crossmatch Result Compatible    Unit Number [id number]    Blood Component Type RED CELLS,LR    Unit division 00    Status of Unit ISSUED    Transfusion Status OK TO TRANSFUSE    Crossmatch Result Compatible    Unit Number [id number]     Blood Component Type RED CELLS,LR    Unit division 00    Status of Unit ISSUED    Transfusion Status OK TO TRANSFUSE    Crossmatch Result      Compatible Performed at [facility], [address] [place], [place] [address]    Unit Number [id number]    Blood Component Type RED CELLS,LR    Unit division 00    Status of Unit ALLOCATED    Transfusion Status OK TO TRANSFUSE    Crossmatch Result Compatible    Unit Number [id number]    Blood Component Type RED CELLS,LR    Unit division 00    Status of Unit ALLOCATED    Transfusion Status OK TO TRANSFUSE    Crossmatch Result Compatible   Prepare RBC (crossmatch)     Status: None   Collection Time: 03/18/2021  2:31 PM  Result Value Ref Range   Order Confirmation      ORDER PROCESSED BY BLOOD BANK Performed at [facility], [place] [address] [place], [place] [address]   Prepare RBC (crossmatch)     Status: None   Collection Time: 03/27/2021  2:31 PM  Result Value Ref Range   Order Confirmation      ORDER PROCESSED BY BLOOD BANK Performed at [facility], [place] [address] [place], [place] [address]   Prepare RBC (crossmatch)     Status: None   Collection Time: 03/12/2021  2:36 PM  Result Value Ref Range   Order Confirmation      ORDER PROCESSED BY BLOOD BANK Performed at [facility], [place] [address], [place], [place] [address]   Prepare RBC (crossmatch)     Status: None   Collection Time: 03/07/2021  3:01 PM  Result Value Ref Range   Order Confirmation      ORDERS RECEIVED TO CROSSMATCH Performed at [facility], [address] [place], [place] [address]   Prepare RBC (crossmatch)     Status: None   Collection Time: 03/27/2021  3:33 PM  Result Value Ref Range   Order Confirmation      ORDERS RECEIVED TO CROSSMATCH Performed at [place] [address], [place],  [address]    CT ANGIO AO+BIFEM W & OR WO CONTRAST  Result Date: 03/10/2021 CLINICAL DATA:  Right lower extremity  arterial revascularization now with lower extremity claudication EXAM: CT ANGIOGRAPHY OF ABDOMINAL AORTA WITH ILIOFEMORAL RUNOFF TECHNIQUE: Multidetector CT imaging of the abdomen, pelvis and lower extremities was performed using the standard protocol during bolus administration of intravenous contrast. Multiplanar CT image reconstructions and MIPs were obtained to evaluate the vascular anatomy. CONTRAST:  168mL OMNIPAQUE IOHEXOL 350 MG/ML SOLN COMPARISON:  None. FINDINGS: VASCULAR Aorta: Normal caliber. Moderate atherosclerotic calcification. No  aneurysm or dissection. No periaortic inflammatory change. 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 Mild poststenotic dilation. Distally widely patent. Normal anatomic configuration of the celiac axis. Replaced left hepatic artery noted. SMA: Widely patent.  No aneurysm or dissection. Renals: Single renal arteries are noted bilaterally. There is a focal dissection flap within the mid segment of the left renal artery resulting in a roughly 50% stenosis. A focal dissection flap is also identified within the mid left renal artery again, similarly, resulting in a roughly 50% stenosis of this vessel in this region. No aneurysm. IMA: Widely patent. RIGHT Lower Extremity Inflow: There is a focal dissection flap identified within the right common iliac artery which does not appear flow limiting in this region which terminates within the proximal right internal iliac artery resulting in focal high-grade stenosis in this location. Moderate atherosclerotic calcification. No aneurysm. Scattered plaque throughout the right common and external iliac artery without evidence of hemodynamically significant stenosis. Outflow: There is irregular mixed atherosclerotic plaque seen within the proximal right common femoral artery above the level of endarterectomy resulting in a focal 60-70 % stenosis of this vessel. This is best seen on image # 211/5. Surgical changes of right  endarterectomy are identified. The right superficial femoral artery is thrombosed at its origin. The right profundus femoral artery demonstrates a high-grade (greater than 75%) stenosis at its origin. Distally, the profundus femoral artery is patent and supplies multiple muscular collaterals which ultimately reconstitute the superficial femoral artery at the level of the adductor hiatus. Runoff: There is extensive mixed atherosclerotic plaque seen throughout the popliteal artery resulting in multilevel high-grade (greater than 75%) stenoses. The anterior tibial artery is occluded shortly after its takeoff. The posterior tibial artery is patent and forms the plantar arch. The peroneal artery is patent and reconstitutes the dorsalis pedis artery. LEFT Lower Extremity Inflow: The left common iliac artery is widely patent. The left internal iliac artery demonstrates a high-grade stenosis at its origin, but is patent. Extensive focal soft plaque results in a 75% stenosis of the left external iliac artery. Outflow: The left common femoral artery is heavily diseased with a focal 50-75% stenosis noted superiorly. The superficial femoral artery is focally occluded at its origin. Distally, the vessel is heavily disease with multilevel 50-75% stenoses. The profundus femoral artery is narrowed at its origin by 50% but is patent. Runoff: Extensive mixed plaque within the popliteal artery results in a greater than 75% stenosis of this vessel in the P1 segment and 50-75% stenosis within the P2 segment. There is variant anatomy with a high takeoff of the anterior tibial artery from the P2/3 junction. This vessel is patent and forms the dorsalis pedis artery. The tibioperoneal trunk demonstrates a focal 50% stenosis at its origin. The peroneal artery is heavily disease. The posterior tibial artery continues to form the plantar arch. Veins: No obvious venous abnormality within the limitations of this arterial phase study. Review of  the MIP images confirms the above findings. NON-VASCULAR Lower chest: Mild bibasilar atelectasis. Cardiac size is mildly enlarged. Extensive multi-vessel coronary artery calcification. Hepatobiliary: No focal liver abnormality is seen. No gallstones, gallbladder wall thickening, or biliary dilatation. Pancreas: Unremarkable Spleen: Unremarkable Adrenals/Urinary Tract: The adrenal glands are unremarkable. The kidneys are atrophic bilaterally. Several simple cortical cysts are seen within the left kidney. Indeterminate low-attenuation lesion is seen within the interpolar region of the right kidney, not well characterized on this examination. The bladder is decompressed. Stomach/Bowel: The stomach, small bowel, and  large bowel are unremarkable. No free intraperitoneal gas or fluid. Lymphatic: No pathologic adenopathy within the abdomen and pelvis. Reproductive: Prostate is unremarkable. Other: There is a focal high attenuation fluid collection within the right groin measuring 5.6 x 6.6 x 9.9 cm compatible with a a hematoma. Musculoskeletal: No acute bone abnormality. 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 Focal dissection flap within the right common iliac artery which does not appear flow limiting within the common iliac artery, but terminates within the origin of the right internal iliac artery and results in a focal high-grade stenosis of this vessel at its origin. Right common femoral artery stenosis of 60-70% above the level of endarterectomy. Right 6 SFA is thrombosed. Profundus femoral artery demonstrates a a greater than 75% stenosis at its origin but ultimately reconstitutes the right lower extremity runoff. Multilevel high-grade stenosis of the popliteal artery. Posterior tibial artery and peroneal arteries formed 2 vessel runoff to the right foot with patency of  the dorsalis pedis and plantar arch. Focal 75% stenosis of the left external iliac artery secondary to soft plaque. Focal 50-75% stenosis of the left common femoral artery. Focal occlusion of the SFA at its origin with multifocal stenosis distally throughout its course. Profundus femoral artery origin stenosis by 50%. Extensive left lower extremity runoff disease with multifocal hemodynamically significant stenosis of the P1 and P2 segments. Variant anatomy with high takeoff of the anterior tibial artery proximal to a 50% stenosis of the tibioperoneal trunk. Two vessel runoff to the left foot patency of the dorsalis pedis and plantar arch. NON-VASCULAR 9.9 cm hematoma within the right groin surrounding the site of endarterectomy. Low-attenuation cortical lesion within the interpolar region of the right kidney, not well characterized on this examination. This could be better assessed with dedicated sonography as an initial step. Electronically Signed   By: Fidela Salisbury MD   On: 03/14/2021 01:30   VAS [place] ABI WITH/WO TBI  Result Date: 03/27/2021  LOWER EXTREMITY DOPPLER STUDY Patient Name:  Michael Blackwell  Date of Exam:   03/06/2021 Medical Rec #: [id number]        Accession #:    [id number] Date of Birth: 10-06-1940        Patient Gender: M Patient Age:   79Y Exam Location:  [facility] Procedure:      VAS [place] ABI WITH/WO TBI Referring Phys: [id number] [place] --------------------------------------------------------------------------------  Indications: Peripheral artery disease.  Vascular Interventions: Right femoral endarterectomy 03/01/21                         Stenting of right common femoral artery and superficial                         femoral artery 07/11/2020. Comparison Study: 08/05/20 Performing Technologist: June Leap RDMS, RVT  Examination Guidelines: A complete evaluation includes at minimum, Doppler waveform signals and systolic blood pressure reading at the level of bilateral  brachial, anterior tibial, and posterior tibial arteries, when vessel segments are accessible. Bilateral testing is considered an integral part of a complete examination. Photoelectric Plethysmograph (PPG) waveforms and toe systolic pressure readings are included as required and additional duplex testing as needed. Limited examinations for reoccurring indications may be performed as noted.  ABI Findings: +---------+------------------+-----+--------+--------+ Right    Rt  Pressure (mmHg)IndexWaveformComment  +---------+------------------+-----+--------+--------+ Brachial                                AVF      +---------+------------------+-----+--------+--------+ ATA                             absent           +---------+------------------+-----+--------+--------+ PTA                             absent           +---------+------------------+-----+--------+--------+ Great Toe                       Absent           +---------+------------------+-----+--------+--------+ +---------+------------------+-----+--------+-------+ Left     Lt Pressure (mmHg)IndexWaveformComment +---------+------------------+-----+--------+-------+ Brachial 92                                     +---------+------------------+-----+--------+-------+ ATA                             absent          +---------+------------------+-----+--------+-------+ PTA                             absent          +---------+------------------+-----+--------+-------+ Great Toe                       Absent          +---------+------------------+-----+--------+-------+  Summary: Right: No audible waveform or PPG for pressures to be obtained. No discernable flow. Left: No audible waveform or PPG for pressures to be obtained. No discernable flow.  *See table(s) above for measurements and observations.  Electronically signed by Jamelle Haring on 03/01/2021 at 4:24:54 PM.    Final    HYBRID OR IMAGING ([place])  Result Date: 03/24/2021 There is no interpretation for this exam.  This order is for images obtained during a surgical procedure.  Please See "Surgeries" Tab for more information regarding the procedure.    PMH:   Past Medical History:  Diagnosis Date  . Acute respiratory failure with hypoxia ([place]) 12/05/2016   [name] 12/05/2016  . Anemia   . Anxiety   . Arthritis    "joints ache at night sometimes" (3/8/20180  . Chronic kidney disease (CKD), stage II (mild)    Acute on chronic kidney disease stage II-III/notes 12/06/2016  . Dyspnea    with exertion  . ESRD (end stage renal disease) ([place])    [place] TTSAT [place]  . Heart murmur    never has caused any problems per patient  . History of blood transfusion   . History of radiation therapy 02/25/14- 04/23/14   prostate 7800 cGy 40 sessions, seminal vesicles 5600 cGy 40 sessions  . Hypercholesteremia   . Hypertension   . OSA on CPAP   . Pneumonia 12/06/2016  . Prostate cancer ([place]) 11/19/13   gleason 4+3=7, volume 34.74 cc  . Spermatocele    right  . Type II diabetes mellitus (H[facility])    diet controlled  .  Wears partial dentures    upper    PSH:   Past Surgical History:  Procedure Laterality Date  . ABDOMINAL AORTOGRAM W/LOWER EXTREMITY N/A 07/11/2020   Procedure: ABDOMINAL AORTOGRAM W/LOWER EXTREMITY;  Surgeon: [name], MD;  Location: [facility];  Service: Cardiovascular;  Laterality: N/A;  . ABDOMINAL AORTOGRAM W/LOWER EXTREMITY Bilateral 02/28/2021   Procedure: ABDOMINAL AORTOGRAM W/LOWER EXTREMITY;  Surgeon: Serafina Mitchell, MD;  Location: [facility];  Service: Cardiovascular;  Laterality: Bilateral;  . AV FISTULA PLACEMENT Right 03/04/2019   Procedure: RIGHT ARM ARTERIOVENOUS  FISTULA CREATION;  Surgeon: [name], MD;  Location: [facility];  Service: Vascular;  Laterality: Right;  . AV FISTULA PLACEMENT Right 12/30/2019   Procedure: INSERTION OF right  arm ARTERIOVENOUS (AV)  GORE-TEX GRAFT;  Surgeon: [name], MD;  Location: [facility];  Service: Vascular;  Laterality: Right;  . BASCILIC VEIN TRANSPOSITION Right 05/26/2019   Procedure: Right Arm BASILIC VEIN TRANSPOSITION SECOND STAGE;  Surgeon: [name], MD;  Location: [facility];  Service: Vascular;  Laterality: Right;  . COLONOSCOPY    . ENDARTERECTOMY FEMORAL  03/01/2021   Procedure: ENDARTERECTOMY FEMORAL;  Surgeon: [name], MD;  Location: [facility];  Service: Vascular;;  . LOWER EXTREMITY ANGIOGRAPHY Bilateral 02/25/2018   Procedure: Lower Extremity Angiography;  Surgeon: [name], MD;  Location: [facility];  Service: Cardiovascular;  Laterality: Bilateral;  limited  . PATCH ANGIOPLASTY Right 03/01/2021   Procedure: PATCH ANGIOPLASTY USING RIGHT GREATER SAPHENOUS VEIN;  Surgeon: [name], MD;  Location: [facility];  Service: Vascular;  Laterality: Right;  . PERIPHERAL VASCULAR ATHERECTOMY Right 07/11/2020   Procedure: PERIPHERAL VASCULAR ATHERECTOMY;  Surgeon: [name], MD;  Location: [facility];  Service: Cardiovascular;  Laterality: Right;  common femoral; sfa  . PERIPHERAL VASCULAR BALLOON ANGIOPLASTY Right 07/11/2020   Procedure: PERIPHERAL VASCULAR BALLOON ANGIOPLASTY;  Surgeon: [name], MD;  Location: [facility];  Service: Cardiovascular;  Laterality: Right;  Profunda  . PERIPHERAL VASCULAR INTERVENTION Right 07/11/2020   Procedure: PERIPHERAL VASCULAR INTERVENTION;  Surgeon: [name], MD;  Location: [place] [facility];  Service: Cardiovascular;  Laterality: Right;  common femoral and superficial femoral stent  . PROSTATE BIOPSY  11/19/13   Gleason 4+3=7, vol 34.74 cc  . RENAL ANGIOGRAPHY N/A 02/25/2018   Procedure: RENAL ANGIOGRAPHY;  Surgeon: [name], MD;  Location: [facility];  Service: Cardiovascular;  Laterality: N/A;  . TONSILLECTOMY     as child     Allergies: No Known Allergies  Medications:   Prior to Admission medications   Medication Sig Start Date End Date Taking? Authorizing Provider  acetaminophen (TYLENOL) 325 MG tablet Take 1-2 tablets (325-650 mg total) by mouth every 4 (four) hours as needed for mild pain (or temp >/= 101 F). 03/02/21   Little Ishikawa, MD  amLODipine (NORVASC) 10 MG tablet Take 5 mg by mouth at bedtime.     [provider]  aspirin EC 81 MG tablet Take 81 mg by mouth at bedtime.    [provider]  B Complex-C (B-COMPLEX WITH VITAMIN C) tablet Take 1 tablet by mouth daily.    [provider]  Calcium Carb-Cholecalciferol 600-800 MG-UNIT TABS Take 1 tablet by mouth daily.     [provider]  calcium carbonate (TUMS - DOSED IN MG ELEMENTAL CALCIUM) 500 MG chewable tablet Chew 1 tablet by mouth 3 (  three) times daily after meals.    [provider]  cloNIDine (CATAPRES) 0.3 MG tablet Take 0.6 mg by mouth 2 (two) times daily.  12/12/15   [provider]  clopidogrel (PLAVIX) 75 MG tablet Take 1 tablet (75 mg total) by mouth daily. 08/05/20 08/05/21  Dagoberto Ligas, PA-C  gabapentin (NEURONTIN) 100 MG capsule Take 1 capsule (100 mg total) by mouth 3 (three) times daily. 02/17/21   Wallene Huh, DPM  hydrALAZINE (APRESOLINE) 100 MG tablet Take 100 mg by mouth 3 (three) times daily.     [provider]  isosorbide mononitrate (IMDUR) 30 MG 24 hr tablet Take 30 mg by mouth every evening.     [provider]  lidocaine-prilocaine (EMLA) cream Apply 1 application topically daily as needed (prior to port being accessed).  02/22/20   [provider]  Omega-3 Fatty Acids (OMEGA 3 PO) Take 1,040 mg by mouth daily.     [provider]  oxyCODONE-acetaminophen (PERCOCET/ROXICET) 5-325 MG tablet Take 2 tablets by mouth every 6 (six) hours as needed for moderate pain. 03/02/21   Dagoberto Ligas, PA-C  polyethylene glycol (MIRALAX /  GLYCOLAX) 17 g packet Take 17 g by mouth daily as needed (constipation.).    [provider]  rosuvastatin (CRESTOR) 10 MG tablet Take 1 tablet (10 mg total) by mouth daily. 06/21/20   Adrian Prows, MD  tamsulosin (FLOMAX) 0.4 MG CAPS capsule Take 1 capsule (0.4 mg total) by mouth every other day. Patient taking differently: Take 0.4 mg by mouth at bedtime. 11/16/19   Aline August, MD  vitamin E 180 MG (400 UNITS) capsule Take 400 Units by mouth daily.     [provider]    Discontinued Meds:   Medications Discontinued During This Encounter  Medication Reason  . hemostatic agents Patient Discharge  . iodixanol (VISIPAQUE) 320 MG/ML injection Patient Discharge  . heparin 6,000 Units in sodium chloride 0.9 % 500 mL irrigation Patient Discharge  . 0.9 % irrigation (POUR BTL) Patient Discharge  . 0.9 %  sodium chloride infusion Patient Transfer    Social History:  reports that he quit smoking about 20 months ago. His smoking use included cigarettes. He quit after 48.00 years of use. He has never used smokeless tobacco. He reports previous alcohol use. He reports that he does not use drugs.  Family History:   Family History  Problem Relation Age of Onset  . Cancer Brother        brain  . Cancer Mother        stomach  . Cancer Father        ? prostate    Blood pressure (!) 118/51, pulse 88, temperature 98.6 F (37 C), temperature source Oral, resp. rate 16, height 5\' 10"  (1.778 m), weight 78.7 kg, SpO2 100 %. General: Intubated Head: NCAT Lungs: Clear bilaterally to auscultation but intubated Heart: RRR with normal S1, S2. No murmurs, rubs, or gallops appreciated. Abdomen: Soft, non-tender, non-distended with decreased  bowel sounds. No rebound/guarding. No obvious abdominal masses. Lower extremities: No s/p wounds on the right leg c/w fem pop and wound vac in right groin Neuro: Intubated and sedated Dialysis Access: RUE AVG + thrill and bruit       [place], Hunt Oris, MD 03/05/2021, 7:20 PM

## 2021-03-08 NOTE — Anesthesia Procedure Notes (Signed)
Arterial Line Insertion Start/End06/30/2022 11:00 AM, 03/08/2021 11:10 AM Performed by: CRNA  Preanesthetic checklist: patient identified, IV checked, site marked, risks and benefits discussed, surgical consent, monitors and equipment checked, pre-op evaluation, timeout performed and anesthesia consent Lidocaine 1% used for infiltration Left, radial was placed Catheter size: 20 G Hand hygiene performed  and maximum sterile barriers used   Attempts: 1 Procedure performed without using ultrasound guided technique. Following insertion, dressing applied and Biopatch. Post procedure assessment: normal  Patient tolerated the procedure well with no immediate complications.

## 2021-03-08 NOTE — Progress Notes (Signed)
eLink Physician-Brief Progress Note Patient Name: Michael Blackwell DOB: Jun 21, 1941 MRN: [id number]   Date of Service  03/10/2021  HPI/Events of Note  Patient admitted to the ICU s/p Fem-pop re-do endarterectomy, he also had a seroma evacuated.  eICU Interventions  New Patient Evaluation.        Kerry Kass Jannelly Bergren 03/15/2021, 7:31 PM

## 2021-03-08 NOTE — Progress Notes (Signed)
PHARMACIST LIPID MONITORING   Michael Blackwell is a 80 y.o. male admitted on 03/22/2021 with ischemic limb.  Pharmacy has been consulted to optimize lipid-lowering therapy with the indication of secondary prevention for clinical ASCVD.  Recent Labs:  Lipid Panel (last 6 months):   Lab Results  Component Value Date   CHOL 177 03/02/2021   TRIG 83 03/02/2021   HDL 70 03/02/2021   CHOLHDL 2.5 03/02/2021   VLDL 17 03/02/2021   LDLCALC 90 03/02/2021    Hepatic function panel (last 6 months):   Lab Results  Component Value Date   AST 17 03/14/2021   ALT 5 03/02/2021   ALKPHOS 54 03/09/2021   BILITOT 0.4 03/20/2021    SCr (since admission):   Serum creatinine: 8.41 mg/dL (H) 03/21/2021 2112 Estimated creatinine clearance: 7.6 mL/min (A)  Current therapy and lipid therapy tolerance Current lipid-lowering therapy: crestor 10 mg  Previous lipid-lowering therapies (if applicable):  Documented or reported allergies or intolerances to lipid-lowering therapies (if applicable): n/a  Assessment:   Patient is excluded from the protocol due to ESRD (ESRD, elevated LFTs, pregnancy/breastfeeding, active liver disease)  Plan:    1.Statin intensity (high intensity recommended for all patients regardless of the LDL):  No statin changes. The patient is already on a high intensity statin.  2.Add ezetimibe (if any one of the following):   Not indicated at this time.  3.Refer to lipid clinic:   No  4.Follow-up with:  Primary care provider - Rogers Blocker, MD  5.Follow-up labs after discharge:  No changes in lipid therapy, repeat a lipid panel in one year.      [place] Crane, Roylene Reason, BCCP Clinical Pharmacist  03/10/2021 7:58 PM   [facility] pharmacy phone numbers are listed on [email or web address]

## 2021-03-08 NOTE — Consult Note (Signed)
NAME:  Michael Blackwell, MRN:  [id number], DOB:  [birth year], LOS: 1 ADMISSION DATE:  03/17/2021, CONSULTATION DATE:  03/08/21 REFERRING MD:  Stanford Breed CHIEF COMPLAINT:  Vent Management   History of Present Illness:  Michael Blackwell is a 80 y.o. male who has a PMH including but not limited to ESRD on HD MWF, PAD, HTN, HLD, DM, OSA on CPAP, prostate cancer.   He had recent admission 02/27/2021 through 03/02/2021 for right foot pain.  He had an aortogram and revascularization of the right leg on 03/01/2021 (extensive right iliofemoral endarterectomy and profundoplasty).  He tolerated this well and was discharged the following day with outpatient follow-up with vascular surgery. On 6/7, he went to vascular surgery as an outpatient for follow-up due to ongoing foot pain as well as edema and discoloration of the right great toe.  He was sent to the hospital as a direct admit.  He had CTA with runoff that showed extensive disease.  He was subsequently taken back to the OR 6/8 for a right redo groin exploration with evacuation of seroma, right redo iliofemoral endarterectomy, right greater saphenous vein harvest, right femoral to below-knee popliteal artery bypass with ipsilateral, translocated, non-reversed greater saphenous vein, right lower extremity angiography.  Intra-op, he had 2L of blood loss and was given back 3L.  He then had hemodynamic instability and was started on vasopressors.  He returned to the ICU and remained ventilated.  PCCM was asked to see in consultation for assistance with medical management.  Pertinent  Medical History:  has Malignant neoplasm of prostate ([place]); Type 2 diabetes mellitus with renal manifestations ([place]); Acute respiratory failure with hypoxia ([place]); Essential hypertension, malignant; Hyperlipidemia LDL goal <100; Iron deficiency anemia; Renal artery stenosis ([place]); Symptomatic anemia; Anemia due to end stage renal disease ([place]); Acute exacerbation of CHF (congestive heart  failure) ([place]); (HFpEF) heart failure with preserved ejection fraction ([place]); Hypertensive emergency; Demand ischemia ([place]); Acute bilateral low back pain without sciatica; Muscle strain; Vitamin D deficiency, unspecified; Unspecified jaundice; Tobacco use; Smoking greater than 40 pack years; Secondary hyperparathyroidism of renal origin ([place]); Pruritus, unspecified; Personal history of prostate cancer; PAD (peripheral artery disease) ([place]); Pain, unspecified; Other long term (current) drug therapy; Other disorders of phosphorus metabolism; Other disorders of electrolyte and fluid balance, not elsewhere classified; OSA on CPAP; Mild protein-calorie malnutrition ([place]); Hypothyroidism, unspecified; Atherosclerotic heart disease of native coronary artery without angina pectoris; Generalized edema; Encounter for adequacy testing for peritoneal dialysis [facility]); Dyspnea, unspecified; Diarrhea, unspecified; Dependence on renal dialysis ([place]); COVID-19; Conductive hearing loss of left ear; Coagulation defect, unspecified ([place]); Hypertensive chronic kidney disease with stage 1 through stage 4 chronic kidney disease, or unspecified chronic kidney disease; Atherosclerosis of native arteries of extremities with intermittent claudication, unspecified extremity ([place]); Anaphylactic shock, unspecified, initial encounter; Anaphylactic reaction due to adverse effect of correct drug or medicament properly administered, initial encounter; Allergy, unspecified, initial encounter; Other abnormal findings in urine; Lower limb ischemia; and Critical lower limb ischemia ([place]) on their problem list.  Significant Hospital Events: Including procedures, antibiotic start and stop dates in addition to other pertinent events   6/8  > admit.  Micro Data: SARS CoV2 6/8 > neg.  Antibiotics: None.  Interim History / Subjective:  Sedated on propofol.  HD cath just placed by me.  Objective:  Blood pressure (!) 118/51, pulse 88,  temperature 98.6 F (37 C), temperature source Oral, resp. rate 16, height 5\' 10"  (1.778 m), weight 78.7 kg, SpO2 100 %.  Vent Mode: PRVC FiO2 (%):  [100 %] 100 % Set Rate:  [16 bmp] 16 bmp Vt Set:  [580 mL] 580 mL PEEP:  [5 cmH20] 5 cmH20 Plateau Pressure:  [16 cmH20] 16 cmH20   Intake/Output Summary (Last 24 hours) at 03/17/2021 1946 Last data filed at 03/01/2021 1708 Gross per 24 hour  Intake 2795 ml  Output 2000 ml  Net 795 ml   Filed Weights   03/16/2021 2010  Weight: 78.7 kg    Examination: General: Adult male, resting in bed, in NAD. Neuro: Sedated, not responsive but withdraws to pain. HEENT: [place]/AT. Sclerae anicteric. ETT in place. Cardiovascular: RRR, no M/R/G.  Lungs: Respirations even and unlabored.  CTA bilaterally, No W/R/R. Abdomen: BS x 4, soft, NT/ND.  Musculoskeletal: R groin wound vac in place.  R calf incisions noted to be clean and dry.  No edema. Skin: Intact, warm, no rashes.   Assessment & Plan:   PAD - s/p multiple surgeries with most recent being today 6/8, right redo groin exploration with evacuation of seroma, right redo iliofemoral endarterectomy, right greater saphenous vein harvest, right femoral to below-knee popliteal artery bypass with ipsilateral, translocated, non-reversed greater saphenous vein, right lower extremity angiography. - Postop care per vascular surgery. - Continue ASA, Clopidogrel.  Respiratory insufficiency - 2/2 above. - Full vent support. - Assess ABG. - Wean as able. - Daily SBT. - Bronchial hygiene. - Follow CXR.  Shock - presumed 2/2 blood loss. - Continue Levophed as needed for goal MAP > 65. - F/u on repeat H/H, see below. - Repeat lactate.  ABLA intraop - 2L EBL and received 3L back.  Repeat H/H pending. - Follow H/H. - Transfuse to maintain Hgb > 7. - Might need to hold ASA and Clopidogrel depending on Hgb drops.  ESRD on HD MWF -last HD session Monday 6/6. - Nephrology consulted, planning for CRRT (HD  catheter placed this evening). - CMP ordered for now.  Hx DM - diet controlled. - SSI if glucose consistently > 180.  OSA on CPAP. - Continue nocturnal CPAP once extubated.  Hx HTN, HLD. - Continue home Rosuvastatin. - Hold home Clonidine, Clopidogrel, Hydralazine, Imdur.   Best practice (evaluated daily):  Diet:  NPO Pain/Anxiety/Delirium protocol (if indicated): Yes (RASS goal -1,-2) VAP protocol (if indicated): Yes DVT prophylaxis: Contraindicated GI prophylaxis: PPI Glucose control:  SSI No Central venous access:  Yes, and it is still needed Arterial line:  Yes, and it is still needed Foley:  N/A Mobility:  bed rest  PT consulted: N/A Last date of multidisciplinary goals of care discussion: None yet. Code Status:  full code Disposition: ICU  Labs   CBC: Recent Labs  Lab 03/02/21 0646 03/28/2021 2112 03/21/2021 0649  WBC 7.9 7.1 5.4  HGB 9.7* 8.7* 8.0*  HCT 29.5* 28.0* 25.8*  MCV 89.9 92.7 92.1  PLT 155 263 932    Basic Metabolic Panel: Recent Labs  Lab 03/02/21 0646 03/23/2021 2112  NA 137 137  K 3.4* 4.3  CL 97* 93*  CO2 29 27  GLUCOSE 83 191*  BUN 21 52*  CREATININE 4.59* 8.41*  CALCIUM 8.8* 9.2   GFR: Estimated Creatinine Clearance: 7.6 mL/min (A) (by C-G formula based on SCr of 8.41 mg/dL (H)). Recent Labs  Lab 03/02/21 0646 03/29/2021 2112 03/10/2021 0649  WBC 7.9 7.1 5.4    Liver Function Tests: Recent Labs  Lab 03/13/2021 2112  AST 17  ALT 5  ALKPHOS 54  BILITOT 0.4  PROT 6.5  ALBUMIN 2.8*   No results for input(s): LIPASE, AMYLASE in the last 168 hours. No results for input(s): AMMONIA in the last 168 hours.  ABG    Component Value Date/Time   TCO2 27 07/11/2020 0939     Coagulation Profile: Recent Labs  Lab 03/06/2021 2112  INR 1.2    Cardiac Enzymes: No results for input(s): CKTOTAL, CKMB, CKMBINDEX, TROPONINI in the last 168 hours.  HbA1C: Hgb A1c MFr Bld  Date/Time Value Ref Range Status  02/27/2021 09:20 PM 5.7  (H) 4.8 - 5.6 % Final    Comment:    (NOTE)         Prediabetes: 5.7 - 6.4         Diabetes: >6.4         Glycemic control for adults with diabetes: <7.0   11/15/2019 03:09 AM 6.1 (H) 4.8 - 5.6 % Final    Comment:    (NOTE) Pre diabetes:          5.7%-6.4% Diabetes:              >6.4% Glycemic control for   <7.0% adults with diabetes     CBG: Recent Labs  Lab 03/01/21 2044 03/02/21 0619 03/02/21 1202 03/04/2021 1055  GLUCAP 247* 107* 143* 114*    Review of Systems:   Unable to obtain as pt is encephalopathic.  Past Medical History:  He,  has a past medical history of Acute respiratory failure with hypoxia ([place]) (12/05/2016), Anemia, Anxiety, Arthritis, Chronic kidney disease (CKD), stage II (mild), Dyspnea, ESRD (end stage renal disease) ([place]), Heart murmur, History of blood transfusion, History of radiation therapy (02/25/14- 04/23/14), Hypercholesteremia, Hypertension, OSA on CPAP, Pneumonia (12/06/2016), Prostate cancer ([place]) (11/19/13), Spermatocele, Type II diabetes mellitus ([place]), and Wears partial dentures.   Surgical History:   Past Surgical History:  Procedure Laterality Date  . ABDOMINAL AORTOGRAM W/LOWER EXTREMITY N/A 07/11/2020   Procedure: ABDOMINAL AORTOGRAM W/LOWER EXTREMITY;  Surgeon: [name], MD;  Location: [place] [facility];  Service: Cardiovascular;  Laterality: N/A;  . ABDOMINAL AORTOGRAM W/LOWER EXTREMITY Bilateral 02/28/2021   Procedure: ABDOMINAL AORTOGRAM W/LOWER EXTREMITY;  Surgeon: Serafina Mitchell, MD;  Location: [place] [facility];  Service: Cardiovascular;  Laterality: Bilateral;  . AV FISTULA PLACEMENT Right 03/04/2019   Procedure: RIGHT ARM ARTERIOVENOUS  FISTULA CREATION;  Surgeon: [name], MD;  Location: [place];  Service: Vascular;  Laterality: Right;  . AV FISTULA PLACEMENT Right 12/30/2019   Procedure: INSERTION OF right  arm ARTERIOVENOUS (AV) GORE-TEX GRAFT;  Surgeon: [name], MD;  Location: [place];  Service: Vascular;  Laterality: Right;  . BASCILIC VEIN TRANSPOSITION Right 05/26/2019   Procedure: Right Arm BASILIC VEIN TRANSPOSITION SECOND STAGE;  Surgeon: [name], MD;  Location: [place];  Service: Vascular;  Laterality: Right;  . COLONOSCOPY    . ENDARTERECTOMY FEMORAL  03/01/2021   Procedure: ENDARTERECTOMY FEMORAL;  Surgeon: [name], MD;  Location: [place];  Service: Vascular;;  . LOWER EXTREMITY ANGIOGRAPHY Bilateral 02/25/2018   Procedure: Lower Extremity Angiography;  Surgeon: [name], MD;  Location: [place] [facility];  Service: Cardiovascular;  Laterality: Bilateral;  limited  . PATCH ANGIOPLASTY Right 03/01/2021   Procedure: PATCH ANGIOPLASTY USING RIGHT GREATER SAPHENOUS VEIN;  Surgeon: [name], MD;  Location: [place];  Service: Vascular;  Laterality: Right;  . PERIPHERAL VASCULAR ATHERECTOMY Right 07/11/2020   Procedure: PERIPHERAL VASCULAR ATHERECTOMY;  Surgeon: [name], MD;  Location:  [place] [facility];  Service: Cardiovascular;  Laterality: Right;  common femoral; sfa  . PERIPHERAL VASCULAR BALLOON ANGIOPLASTY Right 07/11/2020   Procedure: PERIPHERAL VASCULAR BALLOON ANGIOPLASTY;  Surgeon: [name], MD;  Location: [place] [facility];  Service: Cardiovascular;  Laterality: Right;  Profunda  . PERIPHERAL VASCULAR INTERVENTION Right 07/11/2020   Procedure: PERIPHERAL VASCULAR INTERVENTION;  Surgeon: [name], MD;  Location: [place] [facility];  Service: Cardiovascular;  Laterality: Right;  common femoral and superficial femoral stent  . PROSTATE BIOPSY  11/19/13   Gleason 4+3=7, vol 34.74 cc  . RENAL ANGIOGRAPHY N/A 02/25/2018   Procedure: RENAL ANGIOGRAPHY;  Surgeon: [name], MD;  Location: [place] [facility];  Service: Cardiovascular;  Laterality: N/A;  . TONSILLECTOMY     as child     Social History:   reports that he quit smoking about 20 months  ago. His smoking use included cigarettes. He quit after 48.00 years of use. He has never used smokeless tobacco. He reports previous alcohol use. He reports that he does not use drugs.   Family History:  His family history includes Cancer in his brother, father, and mother.   Allergies No Known Allergies   Home Medications  Prior to Admission medications   Medication Sig Start Date End Date Taking? Authorizing Provider  acetaminophen (TYLENOL) 325 MG tablet Take 1-2 tablets (325-650 mg total) by mouth every 4 (four) hours as needed for mild pain (or temp >/= 101 F). 03/02/21   Little Ishikawa, MD  amLODipine (NORVASC) 10 MG tablet Take 5 mg by mouth at bedtime.     [provider]  aspirin EC 81 MG tablet Take 81 mg by mouth at bedtime.    [provider]  B Complex-C (B-COMPLEX WITH VITAMIN C) tablet Take 1 tablet by mouth daily.    [provider]  Calcium Carb-Cholecalciferol 600-800 MG-UNIT TABS Take 1 tablet by mouth daily.     [provider]  calcium carbonate (TUMS - DOSED IN MG ELEMENTAL CALCIUM) 500 MG chewable tablet Chew 1 tablet by mouth 3 (three) times daily after meals.    [provider]  cloNIDine (CATAPRES) 0.3 MG tablet Take 0.6 mg by mouth 2 (two) times daily.  12/12/15   [provider]  clopidogrel (PLAVIX) 75 MG tablet Take 1 tablet (75 mg total) by mouth daily. 08/05/20 08/05/21  Dagoberto Ligas, PA-C  gabapentin (NEURONTIN) 100 MG capsule Take 1 capsule (100 mg total) by mouth 3 (three) times daily. 02/17/21   Wallene Huh, DPM  hydrALAZINE (APRESOLINE) 100 MG tablet Take 100 mg by mouth 3 (three) times daily.     [provider]  isosorbide mononitrate (IMDUR) 30 MG 24 hr tablet Take 30 mg by mouth every evening.     [provider]  lidocaine-prilocaine (EMLA) cream Apply 1 application topically daily as needed (prior to port being accessed).  02/22/20   [provider]  Omega-3 Fatty  Acids (OMEGA 3 PO) Take 1,040 mg by mouth daily.     [provider]  oxyCODONE-acetaminophen (PERCOCET/ROXICET) 5-325 MG tablet Take 2 tablets by mouth every 6 (six) hours as needed for moderate pain. 03/02/21   Dagoberto Ligas, PA-C  polyethylene glycol (MIRALAX / GLYCOLAX) 17 g packet Take 17 g by mouth daily as needed (constipation.).    [provider]  rosuvastatin (CRESTOR) 10 MG tablet Take 1 tablet (10 mg total) by mouth daily. 06/21/20   Adrian Prows,  MD  tamsulosin (FLOMAX) 0.4 MG CAPS capsule Take 1 capsule (0.4 mg total) by mouth every other day. Patient taking differently: Take 0.4 mg by mouth at bedtime. 11/16/19   Aline August, MD  vitamin E 180 MG (400 UNITS) capsule Take 400 Units by mouth daily.     [provider]     Critical care time: 60 min.   Montey Hora, [facility] For pager details, please see AMION or use Epic chat  After 1900, please call [facility] for cross coverage needs 03/05/2021, 7:46 PM

## 2021-03-09 ENCOUNTER — Encounter (HOSPITAL_COMMUNITY): Payer: Self-pay | Admitting: Vascular Surgery

## 2021-03-09 ENCOUNTER — Inpatient Hospital Stay (HOSPITAL_COMMUNITY): Payer: Medicare Other

## 2021-03-09 DIAGNOSIS — D62 Acute posthemorrhagic anemia: Secondary | ICD-10-CM

## 2021-03-09 DIAGNOSIS — I469 Cardiac arrest, cause unspecified: Secondary | ICD-10-CM

## 2021-03-09 DIAGNOSIS — J9601 Acute respiratory failure with hypoxia: Secondary | ICD-10-CM

## 2021-03-09 LAB — RENAL FUNCTION PANEL
Albumin: 2.8 g/dL — ABNORMAL LOW (ref 3.5–5.0)
Albumin: 2.8 g/dL — ABNORMAL LOW (ref 3.5–5.0)
Anion gap: 18 — ABNORMAL HIGH (ref 5–15)
Anion gap: 23 — ABNORMAL HIGH (ref 5–15)
BUN: 47 mg/dL — ABNORMAL HIGH (ref 8–23)
BUN: 31 mg/dL — ABNORMAL HIGH (ref 8–23)
CO2: 20 mmol/L — ABNORMAL LOW (ref 22–32)
CO2: 14 mmol/L — ABNORMAL LOW (ref 22–32)
Calcium: 8.2 mg/dL — ABNORMAL LOW (ref 8.9–10.3)
Calcium: 8.5 mg/dL — ABNORMAL LOW (ref 8.9–10.3)
Chloride: 99 mmol/L (ref 98–111)
Chloride: 101 mmol/L (ref 98–111)
Creatinine, Ser: 7.04 mg/dL — ABNORMAL HIGH (ref 0.61–1.24)
Creatinine, Ser: 5.08 mg/dL — ABNORMAL HIGH (ref 0.61–1.24)
GFR, Estimated: 7 mL/min — ABNORMAL LOW (ref >60)
GFR, Estimated: 11 mL/min — ABNORMAL LOW (ref >60)
Glucose, Bld: 183 mg/dL — ABNORMAL HIGH (ref 70–99)
Glucose, Bld: 108 mg/dL — ABNORMAL HIGH (ref 70–99)
Phosphorus: 6.4 mg/dL — ABNORMAL HIGH (ref 2.5–4.6)
Phosphorus: 6.5 mg/dL — ABNORMAL HIGH (ref 2.5–4.6)
Potassium: 5 mmol/L (ref 3.5–5.1)
Potassium: 5.5 mmol/L — ABNORMAL HIGH (ref 3.5–5.1)
Sodium: 137 mmol/L (ref 135–145)
Sodium: 138 mmol/L (ref 135–145)

## 2021-03-09 LAB — POCT I-STAT 7, (LYTES, BLD GAS, ICA,H+H)
Acid-base deficit: 9 mmol/L — ABNORMAL HIGH (ref 0.0–2.0)
Acid-base deficit: 10 mmol/L — ABNORMAL HIGH (ref 0.0–2.0)
Acid-base deficit: 3 mmol/L — ABNORMAL HIGH (ref 0.0–2.0)
Acid-base deficit: 9 mmol/L — ABNORMAL HIGH (ref 0.0–2.0)
Acid-base deficit: 9 mmol/L — ABNORMAL HIGH (ref 0.0–2.0)
Bicarbonate: 16.7 mmol/L — ABNORMAL LOW (ref 20.0–28.0)
Bicarbonate: 15.3 mmol/L — ABNORMAL LOW (ref 20.0–28.0)
Bicarbonate: 17.6 mmol/L — ABNORMAL LOW (ref 20.0–28.0)
Bicarbonate: 16.1 mmol/L — ABNORMAL LOW (ref 20.0–28.0)
Bicarbonate: 15.6 mmol/L — ABNORMAL LOW (ref 20.0–28.0)
Calcium, Ion: 1.14 mmol/L — ABNORMAL LOW (ref 1.15–1.40)
Calcium, Ion: 1.11 mmol/L — ABNORMAL LOW (ref 1.15–1.40)
Calcium, Ion: 0.88 mmol/L — CL (ref 1.15–1.40)
Calcium, Ion: 0.97 mmol/L — ABNORMAL LOW (ref 1.15–1.40)
Calcium, Ion: 0.92 mmol/L — ABNORMAL LOW (ref 1.15–1.40)
HCT: 24 % — ABNORMAL LOW (ref 39.0–52.0)
HCT: 25 % — ABNORMAL LOW (ref 39.0–52.0)
HCT: 20 % — ABNORMAL LOW (ref 39.0–52.0)
HCT: 18 % — ABNORMAL LOW (ref 39.0–52.0)
HCT: 17 % — ABNORMAL LOW (ref 39.0–52.0)
Hemoglobin: 8.2 g/dL — ABNORMAL LOW (ref 13.0–17.0)
Hemoglobin: 8.5 g/dL — ABNORMAL LOW (ref 13.0–17.0)
Hemoglobin: 6.8 g/dL — CL (ref 13.0–17.0)
Hemoglobin: 6.1 g/dL — CL (ref 13.0–17.0)
Hemoglobin: 5.8 g/dL — CL (ref 13.0–17.0)
O2 Saturation: 99 %
O2 Saturation: 99 %
O2 Saturation: 100 %
O2 Saturation: 100 %
O2 Saturation: 100 %
Patient temperature: 97.1
Patient temperature: 97.1
Patient temperature: 97.7
Patient temperature: 97.1
Potassium: 5.1 mmol/L (ref 3.5–5.1)
Potassium: 5.5 mmol/L — ABNORMAL HIGH (ref 3.5–5.1)
Potassium: 6 mmol/L — ABNORMAL HIGH (ref 3.5–5.1)
Potassium: 4.8 mmol/L (ref 3.5–5.1)
Potassium: 4.9 mmol/L (ref 3.5–5.1)
Sodium: 137 mmol/L (ref 135–145)
Sodium: 137 mmol/L (ref 135–145)
Sodium: 139 mmol/L (ref 135–145)
Sodium: 142 mmol/L (ref 135–145)
Sodium: 144 mmol/L (ref 135–145)
TCO2: 18 mmol/L — ABNORMAL LOW (ref 22–32)
TCO2: 16 mmol/L — ABNORMAL LOW (ref 22–32)
TCO2: 18 mmol/L — ABNORMAL LOW (ref 22–32)
TCO2: 17 mmol/L — ABNORMAL LOW (ref 22–32)
TCO2: 16 mmol/L — ABNORMAL LOW (ref 22–32)
pCO2 arterial: 35.6 mmHg (ref 32.0–48.0)
pCO2 arterial: 28.5 mmHg — ABNORMAL LOW (ref 32.0–48.0)
pCO2 arterial: 16.8 mmHg — CL (ref 32.0–48.0)
pCO2 arterial: 28.3 mmHg — ABNORMAL LOW (ref 32.0–48.0)
pCO2 arterial: 27.5 mmHg — ABNORMAL LOW (ref 32.0–48.0)
pH, Arterial: 7.28 — ABNORMAL LOW (ref 7.350–7.450)
pH, Arterial: 7.332 — ABNORMAL LOW (ref 7.350–7.450)
pH, Arterial: 7.627 (ref 7.350–7.450)
pH, Arterial: 7.361 (ref 7.350–7.450)
pH, Arterial: 7.357 (ref 7.350–7.450)
pO2, Arterial: 165 mmHg — ABNORMAL HIGH (ref 83.0–108.0)
pO2, Arterial: 145 mmHg — ABNORMAL HIGH (ref 83.0–108.0)
pO2, Arterial: 165 mmHg — ABNORMAL HIGH (ref 83.0–108.0)
pO2, Arterial: 412 mmHg — ABNORMAL HIGH (ref 83.0–108.0)
pO2, Arterial: 170 mmHg — ABNORMAL HIGH (ref 83.0–108.0)

## 2021-03-09 LAB — TYPE AND SCREEN
ABO/RH(D): A POS
Antibody Screen: NEGATIVE
Unit division: 0
Unit division: 0
Unit division: 0
Unit division: 0
Unit division: 0
Unit division: 0

## 2021-03-09 LAB — CBC
HCT: 25.5 % — ABNORMAL LOW (ref 39.0–52.0)
HCT: 25.1 % — ABNORMAL LOW (ref 39.0–52.0)
Hemoglobin: 8.7 g/dL — ABNORMAL LOW (ref 13.0–17.0)
Hemoglobin: 8.5 g/dL — ABNORMAL LOW (ref 13.0–17.0)
MCH: 30.6 pg (ref 26.0–34.0)
MCH: 30.5 pg (ref 26.0–34.0)
MCHC: 34.1 g/dL (ref 30.0–36.0)
MCHC: 33.9 g/dL (ref 30.0–36.0)
MCV: 89.8 fL (ref 80.0–100.0)
MCV: 90 fL (ref 80.0–100.0)
Platelets: 213 10*3/uL (ref 150–400)
Platelets: 225 10*3/uL (ref 150–400)
RBC: 2.84 MIL/uL — ABNORMAL LOW (ref 4.22–5.81)
RBC: 2.79 MIL/uL — ABNORMAL LOW (ref 4.22–5.81)
RDW: 15.8 % — ABNORMAL HIGH (ref 11.5–15.5)
RDW: 15.9 % — ABNORMAL HIGH (ref 11.5–15.5)
WBC: 16.1 10*3/uL — ABNORMAL HIGH (ref 4.0–10.5)
WBC: 16 10*3/uL — ABNORMAL HIGH (ref 4.0–10.5)
nRBC: 0 % (ref 0.0–0.2)
nRBC: 0.1 % (ref 0.0–0.2)

## 2021-03-09 LAB — CBC WITH DIFFERENTIAL/PLATELET
Abs Immature Granulocytes: 0.54 10*3/uL — ABNORMAL HIGH (ref 0.00–0.07)
Abs Immature Granulocytes: 0.83 10*3/uL — ABNORMAL HIGH (ref 0.00–0.07)
Basophils Absolute: 0 10*3/uL (ref 0.0–0.1)
Basophils Absolute: 0.1 10*3/uL (ref 0.0–0.1)
Basophils Relative: 0 %
Basophils Relative: 0 %
Eosinophils Absolute: 0 10*3/uL (ref 0.0–0.5)
Eosinophils Absolute: 0.1 10*3/uL (ref 0.0–0.5)
Eosinophils Relative: 0 %
Eosinophils Relative: 0 %
HCT: 22.2 % — ABNORMAL LOW (ref 39.0–52.0)
HCT: 18.7 % — ABNORMAL LOW (ref 39.0–52.0)
Hemoglobin: 7.6 g/dL — ABNORMAL LOW (ref 13.0–17.0)
Hemoglobin: 5.9 g/dL — CL (ref 13.0–17.0)
Immature Granulocytes: 3 %
Immature Granulocytes: 5 %
Lymphocytes Relative: 11 %
Lymphocytes Relative: 9 %
Lymphs Abs: 2 10*3/uL (ref 0.7–4.0)
Lymphs Abs: 1.5 10*3/uL (ref 0.7–4.0)
MCH: 30.4 pg (ref 26.0–34.0)
MCH: 30.3 pg (ref 26.0–34.0)
MCHC: 34.2 g/dL (ref 30.0–36.0)
MCHC: 31.6 g/dL (ref 30.0–36.0)
MCV: 88.8 fL (ref 80.0–100.0)
MCV: 95.9 fL (ref 80.0–100.0)
Monocytes Absolute: 1 10*3/uL (ref 0.1–1.0)
Monocytes Absolute: 1 10*3/uL (ref 0.1–1.0)
Monocytes Relative: 6 %
Monocytes Relative: 7 %
Neutro Abs: 14.8 10*3/uL — ABNORMAL HIGH (ref 1.7–7.7)
Neutro Abs: 12.4 10*3/uL — ABNORMAL HIGH (ref 1.7–7.7)
Neutrophils Relative %: 80 %
Neutrophils Relative %: 79 %
Platelets: 202 10*3/uL (ref 150–400)
Platelets: 171 10*3/uL (ref 150–400)
RBC: 2.5 MIL/uL — ABNORMAL LOW (ref 4.22–5.81)
RBC: 1.95 MIL/uL — ABNORMAL LOW (ref 4.22–5.81)
RDW: 15.8 % — ABNORMAL HIGH (ref 11.5–15.5)
RDW: 16.4 % — ABNORMAL HIGH (ref 11.5–15.5)
WBC: 18.5 10*3/uL — ABNORMAL HIGH (ref 4.0–10.5)
WBC: 15.9 10*3/uL — ABNORMAL HIGH (ref 4.0–10.5)
nRBC: 1.4 % — ABNORMAL HIGH (ref 0.0–0.2)
nRBC: 3.5 % — ABNORMAL HIGH (ref 0.0–0.2)

## 2021-03-09 LAB — COMPREHENSIVE METABOLIC PANEL
ALT: 746 U/L — ABNORMAL HIGH (ref 0–44)
AST: 1481 U/L — ABNORMAL HIGH (ref 15–41)
Albumin: 2.5 g/dL — ABNORMAL LOW (ref 3.5–5.0)
Alkaline Phosphatase: 50 U/L (ref 38–126)
Anion gap: 26 — ABNORMAL HIGH (ref 5–15)
BUN: 29 mg/dL — ABNORMAL HIGH (ref 8–23)
CO2: 15 mmol/L — ABNORMAL LOW (ref 22–32)
Calcium: 8 mg/dL — ABNORMAL LOW (ref 8.9–10.3)
Chloride: 101 mmol/L (ref 98–111)
Creatinine, Ser: 4.89 mg/dL — ABNORMAL HIGH (ref 0.61–1.24)
GFR, Estimated: 12 mL/min — ABNORMAL LOW (ref >60)
Glucose, Bld: 84 mg/dL (ref 70–99)
Potassium: 6.4 mmol/L (ref 3.5–5.1)
Sodium: 142 mmol/L (ref 135–145)
Total Bilirubin: 0.7 mg/dL (ref 0.3–1.2)
Total Protein: 5.1 g/dL — ABNORMAL LOW (ref 6.5–8.1)

## 2021-03-09 LAB — TRIGLYCERIDES: Triglycerides: 203 mg/dL — ABNORMAL HIGH (ref <150)

## 2021-03-09 LAB — MAGNESIUM
Magnesium: 2.1 mg/dL (ref 1.7–2.4)
Magnesium: 2.6 mg/dL — ABNORMAL HIGH (ref 1.7–2.4)
Magnesium: 2.5 mg/dL — ABNORMAL HIGH (ref 1.7–2.4)

## 2021-03-09 LAB — BPAM RBC
Blood Product Expiration Date: 202206292359
Blood Product Expiration Date: 202206292359
Blood Product Expiration Date: 202206292359
Blood Product Expiration Date: 202206292359
Blood Product Expiration Date: 202206292359
Blood Product Expiration Date: 202206302359
ISSUE DATE / TIME: 202206081448
ISSUE DATE / TIME: 202206081448
ISSUE DATE / TIME: 202206081541
ISSUE DATE / TIME: 202206081541
Unit Type and Rh: 6200
Unit Type and Rh: 6200
Unit Type and Rh: 6200
Unit Type and Rh: 6200
Unit Type and Rh: 6200
Unit Type and Rh: 6200

## 2021-03-09 LAB — DIC (DISSEMINATED INTRAVASCULAR COAGULATION)PANEL
D-Dimer, Quant: 3.86 ug/mL-FEU — ABNORMAL HIGH (ref 0.00–0.50)
Fibrinogen: 423 mg/dL (ref 210–475)
INR: 1.6 — ABNORMAL HIGH (ref 0.8–1.2)
Platelets: 210 10*3/uL (ref 150–400)
Prothrombin Time: 18.6 seconds — ABNORMAL HIGH (ref 11.4–15.2)
Smear Review: NONE SEEN
aPTT: 34 seconds (ref 24–36)

## 2021-03-09 LAB — BASIC METABOLIC PANEL
Anion gap: 28 — ABNORMAL HIGH (ref 5–15)
BUN: 29 mg/dL — ABNORMAL HIGH (ref 8–23)
CO2: 15 mmol/L — ABNORMAL LOW (ref 22–32)
Calcium: 7.5 mg/dL — ABNORMAL LOW (ref 8.9–10.3)
Chloride: 104 mmol/L (ref 98–111)
Creatinine, Ser: 4.72 mg/dL — ABNORMAL HIGH (ref 0.61–1.24)
GFR, Estimated: 12 mL/min — ABNORMAL LOW (ref >60)
Glucose, Bld: 47 mg/dL — ABNORMAL LOW (ref 70–99)
Potassium: 5.1 mmol/L (ref 3.5–5.1)
Sodium: 147 mmol/L — ABNORMAL HIGH (ref 135–145)

## 2021-03-09 LAB — LIPID PANEL
Cholesterol: 121 mg/dL (ref 0–200)
HDL: 30 mg/dL — ABNORMAL LOW (ref >40)
LDL Cholesterol: 49 mg/dL (ref 0–99)
Total CHOL/HDL Ratio: 4 RATIO
Triglycerides: 208 mg/dL — ABNORMAL HIGH (ref <150)
VLDL: 42 mg/dL — ABNORMAL HIGH (ref 0–40)

## 2021-03-09 LAB — POCT I-STAT, CHEM 8
BUN: 30 mg/dL — ABNORMAL HIGH (ref 8–23)
Calcium, Ion: 0.91 mmol/L — ABNORMAL LOW (ref 1.15–1.40)
Chloride: 104 mmol/L (ref 98–111)
Creatinine, Ser: 4.5 mg/dL — ABNORMAL HIGH (ref 0.61–1.24)
Glucose, Bld: 47 mg/dL — ABNORMAL LOW (ref 70–99)
HCT: 17 % — ABNORMAL LOW (ref 39.0–52.0)
Hemoglobin: 5.8 g/dL — CL (ref 13.0–17.0)
Potassium: 5 mmol/L (ref 3.5–5.1)
Sodium: 144 mmol/L (ref 135–145)
TCO2: 17 mmol/L — ABNORMAL LOW (ref 22–32)

## 2021-03-09 LAB — GLUCOSE, CAPILLARY
Glucose-Capillary: 101 mg/dL — ABNORMAL HIGH (ref 70–99)
Glucose-Capillary: 159 mg/dL — ABNORMAL HIGH (ref 70–99)
Glucose-Capillary: 161 mg/dL — ABNORMAL HIGH (ref 70–99)
Glucose-Capillary: 170 mg/dL — ABNORMAL HIGH (ref 70–99)

## 2021-03-09 LAB — LACTIC ACID, PLASMA: Lactic Acid, Venous: >11 mmol/L (ref 0.5–1.9)

## 2021-03-09 LAB — PREPARE RBC (CROSSMATCH)

## 2021-03-09 LAB — TROPONIN I (HIGH SENSITIVITY)
Troponin I (High Sensitivity): 180 ng/L (ref <18)
Troponin I (High Sensitivity): 261 ng/L (ref <18)

## 2021-03-09 LAB — BRAIN NATRIURETIC PEPTIDE: B Natriuretic Peptide: 74.8 pg/mL (ref 0.0–100.0)

## 2021-03-09 MED ORDER — STERILE WATER FOR INJECTION IV SOLN
INTRAVENOUS | Status: DC
  Filled 2021-03-09 (×2): qty 1000

## 2021-03-09 MED ORDER — CHLORHEXIDINE GLUCONATE 0.12% ORAL RINSE (MEDLINE KIT)
15.0000 mL | Freq: Two times a day (BID) | OROMUCOSAL | Status: DC
  Administered 2021-03-09: 15 mL via OROMUCOSAL

## 2021-03-09 MED ORDER — ORAL CARE MOUTH RINSE
15.0000 mL | OROMUCOSAL | Status: DC
  Administered 2021-03-09 – 2021-03-11 (×20): 15 mL via OROMUCOSAL

## 2021-03-09 MED ORDER — MIDAZOLAM HCL 2 MG/2ML IJ SOLN
1.0000 mg | INTRAMUSCULAR | Status: DC | PRN
  Administered 2021-03-09: 1 mg via INTRAVENOUS

## 2021-03-09 MED ORDER — PRISMASOL BGK 0/2.5 32-2.5 MEQ/L EC SOLN
Status: DC
  Filled 2021-03-09 (×8): qty 5000

## 2021-03-09 MED ORDER — EPINEPHRINE HCL 5 MG/250ML IV SOLN IN NS
INTRAVENOUS | Status: AC
  Filled 2021-03-09: qty 250

## 2021-03-09 MED ORDER — POLYETHYLENE GLYCOL 3350 17 G PO PACK: 17.0000 g | PACK | Freq: Every day | ORAL | Status: DC | PRN

## 2021-03-09 MED ORDER — SODIUM BICARBONATE 8.4 % IV SOLN
INTRAVENOUS | Status: AC
  Administered 2021-03-09: 50 meq via INTRAVENOUS
  Filled 2021-03-09: qty 50

## 2021-03-09 MED ORDER — FENTANYL CITRATE (PF) 100 MCG/2ML IJ SOLN
25.0000 ug | INTRAMUSCULAR | Status: DC | PRN
Start: 2021-03-09 — End: 2021-03-12
  Administered 2021-03-09: 100 ug via INTRAVENOUS
  Filled 2021-03-09: qty 2

## 2021-03-09 MED ORDER — PHENYLEPHRINE HCL-NACL 10-0.9 MG/250ML-% IV SOLN
0.0000 ug/min | INTRAVENOUS | Status: DC
  Administered 2021-03-09: 300 ug/min via INTRAVENOUS
  Filled 2021-03-09: qty 250

## 2021-03-09 MED ORDER — EPINEPHRINE HCL 5 MG/250ML IV SOLN IN NS
0.5000 ug/min | INTRAVENOUS | Status: DC
  Administered 2021-03-09: 7 ug/min via INTRAVENOUS
  Administered 2021-03-09: 20 ug/min via INTRAVENOUS
  Filled 2021-03-09: qty 250

## 2021-03-09 MED ORDER — POLYETHYLENE GLYCOL 3350 17 G PO PACK
17.0000 g | PACK | Freq: Every day | ORAL | Status: DC
  Administered 2021-03-10: 17 g
  Filled 2021-03-09: qty 1

## 2021-03-09 MED ORDER — INSULIN ASPART 100 UNIT/ML IJ SOLN
1.0000 [IU] | INTRAMUSCULAR | Status: DC
  Administered 2021-03-09 (×3): 2 [IU] via SUBCUTANEOUS
  Administered 2021-03-09 – 2021-03-10 (×2): 1 [IU] via SUBCUTANEOUS
  Administered 2021-03-11: 2 [IU] via SUBCUTANEOUS
  Administered 2021-03-11 (×2): 1 [IU] via SUBCUTANEOUS

## 2021-03-09 MED ORDER — ACETAMINOPHEN 650 MG RE SUPP: 325.0000 mg | RECTAL | Status: DC | PRN

## 2021-03-09 MED ORDER — ACETAMINOPHEN 160 MG/5ML PO SOLN
325.0000 mg | ORAL | Status: DC | PRN
Start: 2021-03-09 — End: 2021-03-12

## 2021-03-09 MED ORDER — DOCUSATE SODIUM 50 MG/5ML PO LIQD
100.0000 mg | Freq: Two times a day (BID) | ORAL | Status: DC
  Administered 2021-03-10: 100 mg
  Filled 2021-03-09 (×2): qty 10

## 2021-03-09 MED ORDER — ALUM & MAG HYDROXIDE-SIMETH 200-200-20 MG/5ML PO SUSP
15.0000 mL | ORAL | Status: DC | PRN
Start: 2021-03-09 — End: 2021-03-12

## 2021-03-09 MED ORDER — DEXTROSE 50 % IV SOLN: 25.0000 g | INTRAVENOUS | Status: AC

## 2021-03-09 MED ORDER — SODIUM BICARBONATE 8.4 % IV SOLN
50.0000 meq | Freq: Once | INTRAVENOUS | Status: AC
  Administered 2021-03-09: 50 meq via INTRAVENOUS

## 2021-03-09 MED ORDER — VANCOMYCIN VARIABLE DOSE PER UNSTABLE RENAL FUNCTION (PHARMACIST DOSING): Status: DC

## 2021-03-09 MED ORDER — SODIUM CHLORIDE 0.9 % IV BOLUS
500.0000 mL | Freq: Once | INTRAVENOUS | Status: AC
  Administered 2021-03-09: 500 mL via INTRAVENOUS

## 2021-03-09 MED ORDER — ALBUTEROL SULFATE (2.5 MG/3ML) 0.083% IN NEBU
10.0000 mg | INHALATION_SOLUTION | Freq: Once | RESPIRATORY_TRACT | Status: DC
  Filled 2021-03-09: qty 12

## 2021-03-09 MED ORDER — ALBUMIN HUMAN 5 % IV SOLN
12.5000 g | Freq: Once | INTRAVENOUS | Status: AC
  Administered 2021-03-09: 12.5 g via INTRAVENOUS

## 2021-03-09 MED ORDER — SODIUM CHLORIDE 0.9 % IV SOLN
2.0000 g | INTRAVENOUS | Status: AC
  Administered 2021-03-10: 2 g via INTRAVENOUS
  Filled 2021-03-09: qty 2

## 2021-03-09 MED ORDER — DOCUSATE SODIUM 50 MG/5ML PO LIQD: 100.0000 mg | Freq: Every day | ORAL | Status: DC

## 2021-03-09 MED ORDER — SENNOSIDES-DOCUSATE SODIUM 8.6-50 MG PO TABS: 1.0000 | ORAL_TABLET | Freq: Every evening | ORAL | Status: DC | PRN

## 2021-03-09 MED ORDER — SODIUM CHLORIDE 0.9% IV SOLUTION: Freq: Once | INTRAVENOUS | Status: AC

## 2021-03-09 MED ORDER — ALBUMIN HUMAN 5 % IV SOLN
INTRAVENOUS | Status: AC
  Filled 2021-03-09: qty 250

## 2021-03-09 MED ORDER — PHENYLEPHRINE HCL-NACL 10-0.9 MG/250ML-% IV SOLN
INTRAVENOUS | Status: AC
  Administered 2021-03-09: 10 mg
  Filled 2021-03-09: qty 250

## 2021-03-09 MED ORDER — ORAL CARE MOUTH RINSE
15.0000 mL | OROMUCOSAL | Status: DC
  Administered 2021-03-09: 15 mL via OROMUCOSAL

## 2021-03-09 MED ORDER — SODIUM BICARBONATE 8.4 % IV SOLN
INTRAVENOUS | Status: AC
  Filled 2021-03-09: qty 50

## 2021-03-09 MED ORDER — SODIUM BICARBONATE 8.4 % IV SOLN: 50.0000 meq | Freq: Once | INTRAVENOUS | Status: AC

## 2021-03-09 MED ORDER — PRISMASOL BGK 0/2.5 32-2.5 MEQ/L EC SOLN
Status: DC
  Filled 2021-03-09 (×32): qty 5000

## 2021-03-09 MED ORDER — CALCIUM GLUCONATE-NACL 1-0.675 GM/50ML-% IV SOLN
1.0000 g | Freq: Once | INTRAVENOUS | Status: AC
  Administered 2021-03-09: 1000 mg via INTRAVENOUS
  Filled 2021-03-09: qty 50

## 2021-03-09 MED ORDER — VANCOMYCIN HCL 1750 MG/350ML IV SOLN
1750.0000 mg | INTRAVENOUS | Status: AC
  Administered 2021-03-10: 1750 mg via INTRAVENOUS
  Filled 2021-03-09: qty 350

## 2021-03-09 MED ORDER — PANTOPRAZOLE SODIUM 40 MG IV SOLR
40.0000 mg | Freq: Every day | INTRAVENOUS | Status: DC
  Administered 2021-03-10: 40 mg via INTRAVENOUS
  Filled 2021-03-09: qty 40

## 2021-03-09 MED ORDER — CALCIUM GLUCONATE-NACL 1-0.675 GM/50ML-% IV SOLN: 1.0000 g | Freq: Once | INTRAVENOUS | Status: DC

## 2021-03-09 MED ORDER — MIDAZOLAM HCL 2 MG/2ML IJ SOLN
1.0000 mg | INTRAMUSCULAR | Status: DC | PRN
Start: 2021-03-09 — End: 2021-03-10
  Administered 2021-03-09: 1 mg via INTRAVENOUS
  Filled 2021-03-09 (×2): qty 2

## 2021-03-09 MED ORDER — DEXTROSE 50 % IV SOLN
INTRAVENOUS | Status: AC
  Administered 2021-03-09: 50 mL
  Filled 2021-03-09: qty 50

## 2021-03-09 MED ORDER — PRISMASOL BGK 0/2.5 32-2.5 MEQ/L EC SOLN
Status: DC
  Filled 2021-03-09 (×7): qty 5000

## 2021-03-09 MED ORDER — CHLORHEXIDINE GLUCONATE 0.12% ORAL RINSE (MEDLINE KIT)
15.0000 mL | Freq: Two times a day (BID) | OROMUCOSAL | Status: DC
  Administered 2021-03-10 – 2021-03-11 (×3): 15 mL via OROMUCOSAL

## 2021-03-09 MED ORDER — FENTANYL CITRATE (PF) 100 MCG/2ML IJ SOLN: 25.0000 ug | INTRAMUSCULAR | Status: DC | PRN

## 2021-03-09 NOTE — Progress Notes (Signed)
SLP Cancellation Note  Patient Details Name: Michael Blackwell MRN: [id number] DOB: [birth year]   Cancelled treatment:       Reason Eval/Treat Not Completed: Medical issues which prohibited therapy (Pt was extubated today at 6, SLP consulted for swallowing at 1129, but pt was subsequently reintubated at 1734. SLP will follow up.)  Rebekah Zackery I. Hardin Negus, [place], [place] Office number [phone] Pager [place] 03/09/2021, 5:56 PM

## 2021-03-09 NOTE — Progress Notes (Signed)
Rapidly escalating pressor requirements. When I arrived to bedside he was only agonally breathing and began rapidly to brady down. Compressions started and code blue called. BVM begun when a second provider at bedside. ACLS protocol, intubated as soon as airway equipment available. Bicarb and epi given during the code. ROSC within 10 minutes. Please see separate code documentation. CRRT washed back during the code. Pressors initially resumed but stopped after code when hypertensive.  Will resume pressors as needed .Will be started back on CRRT. Initial ABG alkalotic. CXR, KUB, EKG, labs pending.   Wife updated via phone as well as vascular surgery attending.   Cc time: 25 minutes   Julian Hy, DO 03/09/21 5:30 PM [facility]

## 2021-03-09 NOTE — Progress Notes (Signed)
OT Cancellation Note  Patient Details Name: Michael Blackwell MRN: [id number] DOB: 1941-07-05   Cancelled Treatment:    Reason Eval/Treat Not Completed: Patient not medically ready. Per PT report, pt intubated on CRRT and RN reports hold at this time as hopeful for extubation today.  Jolaine Artist, OT Acute Rehabilitation Services Pager [phone] Office [phone]   [name] 03/09/2021, 8:12 AM

## 2021-03-09 NOTE — Progress Notes (Signed)
[facility] NEPHROLOGY PROGRESS NOTE  Assessment/ Plan: Pt is a 80 y.o. yo male  ESRD on HD, PAD, HTN, HLD, DM, OSA on CPAP, h/o prostate cancer presenting with right foot pain for many weeks, underwent vascular surgery, seen as a consultation to manage ESRD.  Outpatient Dialysis Orders: Center: [facility]  on MWF. 180NRe, 4 hours, BFR 400, DFR 500, EDW 77kg, 2K/2Ca, AVG 15g Heparin 2300 unit bolus Mircera 50 mcg IV q 4 weeks Calcitriol 0.20mcg PO q HD Velphoro 1 tab PO TID with meals  #PAD:s/p right fem pop bypass 03/20/2021 and exploration of right groin exploration with evacuation of seroma.  Per surgical team.  # ESRD MWF schedule.  Started CRRT on 6/8 because of hypotension.  Continue current CRRT prescription, all 4K bath.  No issue with the filter.  I will change UF 50 cc/h net negative if tolerated by BP.  Monitor lab.  May be able to attempt intermittent HD tomorrow if patient is off of pressure.  #Hypotension/shock:Currently on 4 mics of Levophed.  Plan to gradually wean off pressor.  # Anemia: Hemoglobin 10.5 on admission.  Hemoglobin dropped today probably because of blood loss during surgery.  Monitor lab.  # Secondary hyperparathyroidism: Elevated phosphorus level.  Resume Velphoro when he starts eating.  Discussed with the nurse and patient's wife.  Subjective: Seen and examined ICU.  Currently intubated and sedated.  Requiring Levophed.  Tolerating CRRT well with no net UF. Objective Vital signs in last 24 hours: Vitals:   03/09/21 0630 03/09/21 0640 03/09/21 0654 03/09/21 0754  BP: (!) 110/55     Pulse: 82 84 92   Resp: 16 20 (!) 21   Temp: (!) 95.1 F (35.1 C)   (!) 95.3 F (35.2 C)  TempSrc: Rectal   Axillary  SpO2: 99% 100% 100%   Weight:      Height:       Weight change: 0.146 kg  Intake/Output Summary (Last 24 hours) at 03/09/2021 0806 Last data filed at 03/09/2021 0600 Gross per 24 hour  Intake 3213.56 ml  Output 2518 ml  Net 695.56  ml       Labs: Basic Metabolic Panel: Recent Labs  Lab 03/13/2021 2112 03/02/2021 1651 03/27/2021 2003 03/15/2021 2004 03/09/21 0347  NA 137   < > 138 139 137  K 4.3   < > 4.8 4.9 5.0  CL 93*  --   --  103 99  CO2 27  --   --  18* 20*  GLUCOSE 191*  --   --  201* 183*  BUN 52*  --   --  57* 47*  CREATININE 8.41*  --   --  9.25* 7.04*  CALCIUM 9.2  --   --  8.4* 8.2*  PHOS  --   --   --   --  6.4*   < > = values in this interval not displayed.   Liver Function Tests: Recent Labs  Lab 03/06/2021 2112 03/05/2021 2004 03/09/21 0347  AST 17 11*  --   ALT 5 6  --   ALKPHOS 54 34*  --   BILITOT 0.4 1.0  --   PROT 6.5 4.9*  --   ALBUMIN 2.8* 2.6* 2.8*   No results for input(s): LIPASE, AMYLASE in the last 168 hours. No results for input(s): AMMONIA in the last 168 hours. CBC: Recent Labs  Lab 03/06/2021 2112 03/30/2021 0649 03/30/2021 1651 03/28/2021 2003 03/09/21 0347  WBC 7.1 5.4  --   --  16.1*  HGB 8.7* 8.0* 10.5* 7.8* 8.7*  HCT 28.0* 25.8* 31.0* 23.0* 25.5*  MCV 92.7 92.1  --   --  89.8  PLT 263 244  --   --  213   Cardiac Enzymes: No results for input(s): CKTOTAL, CKMB, CKMBINDEX, TROPONINI in the last 168 hours. CBG: Recent Labs  Lab 03/02/21 1202 03/18/2021 1055 03/09/21 0055 03/09/21 0346 03/09/21 0752  GLUCAP 143* 114* 161* 170* 159*    Iron Studies: No results for input(s): IRON, TIBC, TRANSFERRIN, FERRITIN in the last 72 hours. Studies/Results: DG Abd 1 View  Result Date: 03/09/2021 CLINICAL DATA:  Feeding tube placement EXAM: ABDOMEN - 1 VIEW COMPARISON:  None. FINDINGS: Nasogastric tube is seen with its tip overlying the proximal body of the stomach. The visualized abdominal gas pattern is nonobstructive. Moderate stool seen within the distal colon. Pelvis excluded from view. Vascular calcifications are seen within the abdominal aorta and iliac vasculature bilaterally. IMPRESSION: Nasogastric tube tip within the proximal body of the stomach. Electronically  Signed   By: Fidela Salisbury MD   On: 03/09/2021 01:36   CT ANGIO AO+BIFEM W & OR WO CONTRAST  Result Date: 03/04/2021 CLINICAL DATA:  Right lower extremity arterial revascularization now with lower extremity claudication EXAM: CT ANGIOGRAPHY OF ABDOMINAL AORTA WITH ILIOFEMORAL RUNOFF TECHNIQUE: Multidetector CT imaging of the abdomen, pelvis and lower extremities was performed using the standard protocol during bolus administration of intravenous contrast. Multiplanar CT image reconstructions and MIPs were obtained to evaluate the vascular anatomy. CONTRAST:  133mL OMNIPAQUE IOHEXOL 350 MG/ML SOLN COMPARISON:  None. FINDINGS: VASCULAR Aorta: Normal caliber. Moderate atherosclerotic calcification. No aneurysm or dissection. No periaortic inflammatory change. 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 Mild poststenotic dilation. Distally widely patent. Normal anatomic configuration of the celiac axis. Replaced left hepatic artery noted. SMA: Widely patent.  No aneurysm or dissection. Renals: Single renal arteries are noted bilaterally. There is a focal dissection flap within the mid segment of the left renal artery resulting in a roughly 50% stenosis. A focal dissection flap is also identified within the mid left renal artery again, similarly, resulting in a roughly 50% stenosis of this vessel in this region. No aneurysm. IMA: Widely patent. RIGHT Lower Extremity Inflow: There is a focal dissection flap identified within the right common iliac artery which does not appear flow limiting in this region which terminates within the proximal right internal iliac artery resulting in focal high-grade stenosis in this location. Moderate atherosclerotic calcification. No aneurysm. Scattered plaque throughout the right common and external iliac artery without evidence of hemodynamically significant stenosis. Outflow: There is irregular mixed atherosclerotic plaque seen within the proximal right common femoral  artery above the level of endarterectomy resulting in a focal 60-70 % stenosis of this vessel. This is best seen on image # 211/5. Surgical changes of right endarterectomy are identified. The right superficial femoral artery is thrombosed at its origin. The right profundus femoral artery demonstrates a high-grade (greater than 75%) stenosis at its origin. Distally, the profundus femoral artery is patent and supplies multiple muscular collaterals which ultimately reconstitute the superficial femoral artery at the level of the adductor hiatus. Runoff: There is extensive mixed atherosclerotic plaque seen throughout the popliteal artery resulting in multilevel high-grade (greater than 75%) stenoses. The anterior tibial artery is occluded shortly after its takeoff. The posterior tibial artery is patent and forms the plantar arch. The peroneal artery is patent and reconstitutes the dorsalis pedis artery. LEFT Lower Extremity Inflow:  The left common iliac artery is widely patent. The left internal iliac artery demonstrates a high-grade stenosis at its origin, but is patent. Extensive focal soft plaque results in a 75% stenosis of the left external iliac artery. Outflow: The left common femoral artery is heavily diseased with a focal 50-75% stenosis noted superiorly. The superficial femoral artery is focally occluded at its origin. Distally, the vessel is heavily disease with multilevel 50-75% stenoses. The profundus femoral artery is narrowed at its origin by 50% but is patent. Runoff: Extensive mixed plaque within the popliteal artery results in a greater than 75% stenosis of this vessel in the P1 segment and 50-75% stenosis within the P2 segment. There is variant anatomy with a high takeoff of the anterior tibial artery from the P2/3 junction. This vessel is patent and forms the dorsalis pedis artery. The tibioperoneal trunk demonstrates a focal 50% stenosis at its origin. The peroneal artery is heavily disease. The  posterior tibial artery continues to form the plantar arch. Veins: No obvious venous abnormality within the limitations of this arterial phase study. Review of the MIP images confirms the above findings. NON-VASCULAR Lower chest: Mild bibasilar atelectasis. Cardiac size is mildly enlarged. Extensive multi-vessel coronary artery calcification. Hepatobiliary: No focal liver abnormality is seen. No gallstones, gallbladder wall thickening, or biliary dilatation. Pancreas: Unremarkable Spleen: Unremarkable Adrenals/Urinary Tract: The adrenal glands are unremarkable. The kidneys are atrophic bilaterally. Several simple cortical cysts are seen within the left kidney. Indeterminate low-attenuation lesion is seen within the interpolar region of the right kidney, not well characterized on this examination. The bladder is decompressed. Stomach/Bowel: The stomach, small bowel, and large bowel are unremarkable. No free intraperitoneal gas or fluid. Lymphatic: No pathologic adenopathy within the abdomen and pelvis. Reproductive: Prostate is unremarkable. Other: There is a focal high attenuation fluid collection within the right groin measuring 5.6 x 6.6 x 9.9 cm compatible with a a hematoma. Musculoskeletal: No acute bone abnormality. 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 Focal dissection flap within the right common iliac artery which does not appear flow limiting within the common iliac artery, but terminates within the origin of the right internal iliac artery and results in a focal high-grade stenosis of this vessel at its origin. Right common femoral artery stenosis of 60-70% above the level of endarterectomy. Right 6 SFA is thrombosed. Profundus femoral artery demonstrates a a greater than 75% stenosis at its origin but ultimately reconstitutes the right lower extremity  runoff. Multilevel high-grade stenosis of the popliteal artery. Posterior tibial artery and peroneal arteries formed 2 vessel runoff to the right foot with patency of the dorsalis pedis and plantar arch. Focal 75% stenosis of the left external iliac artery secondary to soft plaque. Focal 50-75% stenosis of the left common femoral artery. Focal occlusion of the SFA at its origin with multifocal stenosis distally throughout its course. Profundus femoral artery origin stenosis by 50%. Extensive left lower extremity runoff disease with multifocal hemodynamically significant stenosis of the P1 and P2 segments. Variant anatomy with high takeoff of the anterior tibial artery proximal to a 50% stenosis of the tibioperoneal trunk. Two vessel runoff to the left foot patency of the dorsalis pedis and plantar arch. NON-VASCULAR 9.9 cm hematoma within the right groin surrounding the site of endarterectomy. Low-attenuation cortical lesion within the interpolar region of the right kidney, not well characterized on this examination.  This could be better assessed with dedicated sonography as an initial step. Electronically Signed   By: Fidela Salisbury MD   On: 03/15/2021 01:30   DG CHEST PORT 1 VIEW  Result Date: 03/09/2021 CLINICAL DATA:  Feeding tube placed EXAM: PORTABLE CHEST 1 VIEW COMPARISON:  03/15/2021 FINDINGS: Nasogastric tube is seen with its tip overlying the gastric fundus. Left internal jugular hemodialysis catheter tip noted at the superior cavoatrial junction. Endotracheal tube noted 3.4 cm above the carina. Lungs are clear. No pneumothorax or pleural effusion. Cardiac size within normal limits. Pulmonary vascularity is normal. IMPRESSION: Support lines and tubes in appropriate position. Lungs are clear. Electronically Signed   By: Fidela Salisbury MD   On: 03/09/2021 01:37   DG CHEST PORT 1 VIEW  Result Date: 03/12/2021 CLINICAL DATA:  Central line placement. EXAM: PORTABLE CHEST 1 VIEW COMPARISON:  Feb 27, 2021.  FINDINGS: The heart size and mediastinal contours are within normal limits. Both lungs are clear. Endotracheal tube is in good position. Interval placement of left internal jugular catheter with distal tip in expected position of cavoatrial junction. No pneumothorax or pleural effusion is noted. The visualized skeletal structures are unremarkable. IMPRESSION: Endotracheal tube in grossly good position. Interval placement of left internal jugular catheter with distal tip in expected position of cavoatrial junction. Electronically Signed   By: Marijo Conception M.D.   On: 03/10/2021 20:06   VAS [place] ABI WITH/WO TBI  Result Date: 03/25/2021  LOWER EXTREMITY DOPPLER STUDY Patient Name:  DESHONE LYSSY  Date of Exam:   03/28/2021 Medical Rec #: [id number]        Accession #:    [id number] Date of Birth: October 06, 1940        Patient Gender: M Patient Age:   79Y Exam Location:  [facility] Procedure:      VAS [place] ABI WITH/WO TBI Referring Phys: [id number] [place] --------------------------------------------------------------------------------  Indications: Peripheral artery disease.  Vascular Interventions: Right femoral endarterectomy 03/01/21                         Stenting of right common femoral artery and superficial                         femoral artery 07/11/2020. Comparison Study: 08/05/20 Performing Technologist: June Leap RDMS, RVT  Examination Guidelines: A complete evaluation includes at minimum, Doppler waveform signals and systolic blood pressure reading at the level of bilateral brachial, anterior tibial, and posterior tibial arteries, when vessel segments are accessible. Bilateral testing is considered an integral part of a complete examination. Photoelectric Plethysmograph (PPG) waveforms and toe systolic pressure readings are included as required and additional duplex testing as needed. Limited examinations for reoccurring indications may be performed as noted.  ABI Findings:  +---------+------------------+-----+--------+--------+ Right    Rt Pressure (mmHg)IndexWaveformComment  +---------+------------------+-----+--------+--------+ Brachial                                AVF      +---------+------------------+-----+--------+--------+ ATA                             absent           +---------+------------------+-----+--------+--------+ PTA  absent           +---------+------------------+-----+--------+--------+ Great Toe                       Absent           +---------+------------------+-----+--------+--------+ +---------+------------------+-----+--------+-------+ Left     Lt Pressure (mmHg)IndexWaveformComment +---------+------------------+-----+--------+-------+ Brachial 92                                     +---------+------------------+-----+--------+-------+ ATA                             absent          +---------+------------------+-----+--------+-------+ PTA                             absent          +---------+------------------+-----+--------+-------+ Great Toe                       Absent          +---------+------------------+-----+--------+-------+  Summary: Right: No audible waveform or PPG for pressures to be obtained. No discernable flow. Left: No audible waveform or PPG for pressures to be obtained. No discernable flow.  *See table(s) above for measurements and observations.  Electronically signed by Jamelle Haring on 03/23/2021 at 4:24:54 PM.    Final    HYBRID OR IMAGING ([place])  Result Date: 03/13/2021 There is no interpretation for this exam.  This order is for images obtained during a surgical procedure.  Please See "Surgeries" Tab for more information regarding the procedure.    Medications: Infusions:   prismasol BGK 4/2.5 500 mL/hr at 03/04/2021 2256    prismasol BGK 4/2.5 300 mL/hr at 03/27/2021 2256   sodium chloride     sodium chloride     sodium chloride      sodium chloride     sodium chloride     sodium chloride     sodium chloride Stopped (03/09/21 0246)   magnesium sulfate bolus IVPB     norepinephrine (LEVOPHED) Adult infusion 4 mcg/min (03/09/21 0600)   prismasol BGK 4/2.5 1,500 mL/hr at 03/09/21 0625   propofol (DIPRIVAN) infusion 25 mcg/kg/min (03/09/21 0600)    Scheduled Medications:  aspirin  81 mg Per Tube Daily   calcium carbonate  1 tablet Oral TID PC   calcium-vitamin D  1 tablet Oral Daily   Chlorhexidine Gluconate Cloth  6 each Topical Daily   clopidogrel  75 mg Per Tube Daily   docusate sodium  100 mg Oral Daily   heparin  5,000 Units Subcutaneous Q8H   insulin aspart  1-3 Units Subcutaneous Q4H   pantoprazole (PROTONIX) IV  40 mg Intravenous Q24H   rosuvastatin  10 mg Per Tube Daily   sodium chloride flush  3 mL Intravenous Q12H   tamsulosin  0.4 mg Oral QHS    have reviewed scheduled and prn medications.  Physical Exam: General: Ill-looking male intubated and sedated. Heart:RRR, s1s2 nl Lungs: Coarse breath sound bilateral Abdomen:soft, Non-tender, non-distended Extremities:No edema Dialysis Access: Right upper extremity AV graft has thrill and bruit. Temporary HD catheter present for CRRT.  Willia Genrich Tanna Furry 03/09/2021,8:06 AM  LOS: 2 days

## 2021-03-09 NOTE — H&P (Signed)
[facility] [place]  ASSESSMENT / PLAN: 80 y.o. male status post extended right ilio femoral endarterectomy.  He has some right groin swelling which I think is consistent with a small seroma or hematoma.  Concerning is his right great toe duskiness and superficial, interdigital ulceration.  Ankle-brachial index in the clinic today is 0.0 bilaterally.  We will plan to admit the patient directly to the hospital.  Obtain a CT angiogram of his aorta with runoff to the lower extremities.  Started heparin drip.  Check routine lab work.  N.p.o. after midnight in anticipation of intervention in the morning.  CHIEF COMPLAINT: Recent surgery, swelling in the groin.  HISTORY OF PRESENT ILLNESS: Michael Blackwell is a 80 y.o. male who returns to clinic after extensive right iliofemoral endarterectomy, and profundoplasty 03/01/2021 for ischemic rest pain with Dr. Donzetta Matters.  Tolerated this procedure quite well and was discharged postop day 1.  Patient reports swelling in the right groin for which he seeks evaluation.  His wife noticed some discoloration of his right great toe.  He has continued pain in his right foot similar to his pain prior to surgery.  He reports no symptoms in his left foot.  He is ambulatory with significant assistance.  He has no wounds about his left foot.  Past Medical History:  Diagnosis Date   Acute respiratory failure with hypoxia ([place]) 12/05/2016   [name] 12/05/2016   Anemia    Anxiety    Arthritis    "joints ache at night sometimes" (3/8/20180   Chronic kidney disease (CKD), stage II (mild)    Acute on chronic kidney disease stage II-III/notes 12/06/2016   Dyspnea    with exertion   ESRD (end stage renal disease) ([place])    [place]emo TTSAT [place]   Heart murmur    never has caused any problems per patient   History of blood transfusion    History of radiation therapy 02/25/14- 04/23/14   prostate 7800 cGy 40 sessions, seminal vesicles 5600 cGy 40 sessions    Hypercholesteremia    Hypertension    OSA on CPAP    Pneumonia 12/06/2016   Prostate cancer ([place]) 11/19/13   gleason 4+3=7, volume 34.74 cc   Spermatocele    right   Type II diabetes mellitus ([place])    diet controlled   Wears partial dentures    upper    Past Surgical History:  Procedure Laterality Date   ABDOMINAL AORTOGRAM W/LOWER EXTREMITY N/A 07/11/2020   Procedure: ABDOMINAL AORTOGRAM W/LOWER EXTREMITY;  Surgeon: [name], MD;  Location: [facility];  Service: Cardiovascular;  Laterality: N/A;   ABDOMINAL AORTOGRAM W/LOWER EXTREMITY Bilateral 02/28/2021   Procedure: ABDOMINAL AORTOGRAM W/LOWER EXTREMITY;  Surgeon: Serafina Mitchell, MD;  Location: [facility];  Service: Cardiovascular;  Laterality: Bilateral;   AV FISTULA PLACEMENT Right 03/04/2019   Procedure: RIGHT ARM ARTERIOVENOUS  FISTULA CREATION;  Surgeon: [name], MD;  Location: [facility];  Service: Vascular;  Laterality: Right;   AV FISTULA PLACEMENT Right 12/30/2019   Procedure: INSERTION OF right  arm ARTERIOVENOUS (AV) GORE-TEX GRAFT;  Surgeon: [name], MD;  Location: [facility];  Service: Vascular;  Laterality: Right;   [place] Right 05/26/2019   Procedure: Right Arm BASILIC VEIN TRANSPOSITION SECOND STAGE;  Surgeon: [name], MD;  Location: [facility];  Service: Vascular;  Laterality: Right;   COLONOSCOPY     ENDARTERECTOMY FEMORAL  03/01/2021   Procedure: ENDARTERECTOMY FEMORAL;  Surgeon: [name], MD;  Location: [facility];  Service: Vascular;;   LOWER EXTREMITY ANGIOGRAPHY Bilateral 02/25/2018   Procedure: Lower Extremity Angiography;  Surgeon: [name], MD;  Location: [facility];  Service: Cardiovascular;  Laterality: Bilateral;  limited   PATCH ANGIOPLASTY Right 03/01/2021   Procedure: PATCH ANGIOPLASTY USING RIGHT GREATER SAPHENOUS VEIN;  Surgeon: [name], MD;  Location: [facility];  Service:  Vascular;  Laterality: Right;   PERIPHERAL VASCULAR ATHERECTOMY Right 07/11/2020   Procedure: PERIPHERAL VASCULAR ATHERECTOMY;  Surgeon: [name], MD;  Location: [facility];  Service: Cardiovascular;  Laterality: Right;  common femoral; sfa   PERIPHERAL VASCULAR BALLOON ANGIOPLASTY Right 07/11/2020   Procedure: PERIPHERAL VASCULAR BALLOON ANGIOPLASTY;  Surgeon: [name], MD;  Location: [facility];  Service: Cardiovascular;  Laterality: Right;  [place]   PERIPHERAL VASCULAR INTERVENTION Right 07/11/2020   Procedure: PERIPHERAL VASCULAR INTERVENTION;  Surgeon: [name], MD;  Location: [facility];  Service: Cardiovascular;  Laterality: Right;  common femoral and superficial femoral stent   PROSTATE BIOPSY  11/19/13   Gleason 4+3=7, vol 34.74 cc   RENAL ANGIOGRAPHY N/A 02/25/2018   Procedure: RENAL ANGIOGRAPHY;  Surgeon: [name], MD;  Location: [facility];  Service: Cardiovascular;  Laterality: N/A;   TONSILLECTOMY     as child    Family History  Problem Relation Age of Onset   Cancer Brother        brain   Cancer Mother        stomach   Cancer Father        ? prostate    Social History   Socioeconomic History   Marital status: Married    Spouse name: Not on file   Number of children: Not on file   Years of education: Not on file   Highest education level: Not on file  Occupational History   Not on file  Tobacco Use   Smoking status: Former    Years: 48.00    Pack years: 0.00    Types: Cigarettes    Quit date: 07/02/2019    Years since quitting: 1.6   Smokeless tobacco: Never   Tobacco comments:    "I smoke 1 cig a month"- per patient on 05/25/19  Vaping Use   Vaping Use: Never used  Substance and Sexual Activity   Alcohol use: Not Currently   Drug use: No   Sexual activity: Yes  Other Topics Concern   Not on file  Social History Narrative   Not on file   Social  Determinants of Health   Financial Resource Strain: Not on file  Food Insecurity: Not on file  Transportation Needs: Not on file  Physical Activity: Not on file  Stress: Not on file  Social Connections: Not on file  Intimate Partner Violence: Not on file    No Known Allergies  Current Facility-Administered Medications  Medication Dose Route Frequency Provider Last Rate Last Admin    prismasol BGK 4/2.5 infusion   CRRT Continuous Dwana Melena, MD 500 mL/hr at 03/13/2021 2256 New Bag at 03/02/2021 [place] 4/2.5 infusion   CRRT Continuous Dwana Melena, MD 300 mL/hr at 03/28/2021 2256 New Bag at 03/23/2021 2256   0.9 %  sodium chloride infusion  250 mL Intravenous PRN Baglia, Corrina, PA-C       0.9 %  sodium chloride infusion  10 mL/hr Intravenous Once  Baglia, Corrina, PA-C       0.9 %  sodium chloride infusion  10 mL/hr Intravenous Once Baglia, Corrina, PA-C       0.9 %  sodium chloride infusion  10 mL/hr Intravenous Once Baglia, Corrina, PA-C       0.9 %  sodium chloride infusion  10 mL/hr Intravenous Once Baglia, Corrina, PA-C       0.9 %  sodium chloride infusion  500 mL Intravenous Once PRN Baglia, Corrina, PA-C       0.9 %  sodium chloride infusion   Intravenous Continuous Baglia, Corrina, PA-C   Stopped at 03/09/21 0246   acetaminophen (TYLENOL) tablet 325-650 mg  325-650 mg Oral Q4H PRN Baglia, Corrina, PA-C       Or   acetaminophen (TYLENOL) suppository 325-650 mg  325-650 mg Rectal Q4H PRN Baglia, Corrina, PA-C       alum & mag hydroxide-simeth (MAALOX/MYLANTA) 200-200-20 MG/5ML suspension 15-30 mL  15-30 mL Oral Q2H PRN Baglia, Corrina, PA-C       aspirin chewable tablet 81 mg  81 mg Per Tube Daily Desai, Rahul P, PA-C   81 mg at 03/09/21 0048   bisacodyl (DULCOLAX) suppository 10 mg  10 mg Rectal Daily PRN Baglia, Corrina, PA-C       calcium carbonate (TUMS - dosed in mg elemental calcium) chewable tablet 200 mg of elemental calcium  1 tablet Oral TID PC Baglia, Corrina,  PA-C       calcium-vitamin D (OSCAL WITH D) 500-200 MG-UNIT per tablet 1 tablet  1 tablet Oral Daily Baglia, Corrina, PA-C       Chlorhexidine Gluconate Cloth 2 % PADS 6 each  6 each Topical Daily Candee Furbish, MD   6 each at 03/24/2021 2030   clopidogrel (PLAVIX) tablet 75 mg  75 mg Per Tube Daily Desai, Rahul P, PA-C       docusate sodium (COLACE) capsule 100 mg  100 mg Oral Daily Baglia, Corrina, PA-C       fentaNYL (SUBLIMAZE) injection 25 mcg  25 mcg Intravenous Q15 min PRN Estill Cotta, NP       fentaNYL (SUBLIMAZE) injection 25-100 mcg  25-100 mcg Intravenous Q30 min PRN Estill Cotta, NP       heparin injection 1,000-6,000 Units  1,000-6,000 Units CRRT PRN Dwana Melena, MD   2,800 Units at 03/17/2021 2040   heparin injection 5,000 Units  5,000 Units Subcutaneous Q8H Baglia, Corrina, PA-C   5,000 Units at 03/09/21 6270   hydrALAZINE (APRESOLINE) injection 5 mg  5 mg Intravenous Q20 Min PRN Baglia, Corrina, PA-C       insulin aspart (novoLOG) injection 1-3 Units  1-3 Units Subcutaneous Q4H Frederik Pear, MD   2 Units at 03/09/21 0353   labetalol (NORMODYNE) injection 10 mg  10 mg Intravenous Q10 min PRN Baglia, Corrina, PA-C       magnesium sulfate IVPB 2 g 50 mL  2 g Intravenous Daily PRN Baglia, Corrina, PA-C       metoprolol tartrate (LOPRESSOR) injection 2-5 mg  2-5 mg Intravenous Q2H PRN Baglia, Corrina, PA-C       norepinephrine (LEVOPHED) 16 mg in 289mL premix infusion  0-40 mcg/min Intravenous Titrated Estill Cotta, NP 3.75 mL/hr at 03/09/21 0600 4 mcg/min at 03/09/21 0600   ondansetron (ZOFRAN) injection 4 mg  4 mg Intravenous Q6H PRN Baglia, Corrina, PA-C   4 mg at 03/04/2021 1849   pantoprazole (PROTONIX) injection 40 mg  40 mg Intravenous Q24H Desai, Rahul P, PA-C   40 mg at 03/09/21 0048   phenol (CHLORASEPTIC) mouth spray 1 spray  1 spray Mouth/Throat PRN Baglia, Corrina, PA-C       polyethylene glycol (MIRALAX / GLYCOLAX) packet 17 g  17 g Oral Daily PRN Baglia,  Corrina, PA-C       prismasol BGK 4/2.5 infusion   CRRT Continuous Dwana Melena, MD 1,500 mL/hr at 03/09/21 0625 New Bag at 03/09/21 0625   propofol (DIPRIVAN) 1000 MG/100ML infusion  0-50 mcg/kg/min Intravenous Continuous Estill Cotta, NP 11.81 mL/hr at 03/09/21 0600 25 mcg/kg/min at 03/09/21 0600   rosuvastatin (CRESTOR) tablet 10 mg  10 mg Per Tube Daily Desai, Rahul P, PA-C       senna-docusate (Senokot-S) tablet 1 tablet  1 tablet Oral QHS PRN Baglia, Corrina, PA-C       sodium chloride 0.9 % primer fluid for CRRT   CRRT PRN Dwana Melena, MD       sodium chloride flush (NS) 0.9 % injection 3 mL  3 mL Intravenous Q12H Baglia, Corrina, PA-C   3 mL at 03/10/2021 2224   sodium chloride flush (NS) 0.9 % injection 3 mL  3 mL Intravenous PRN Baglia, Corrina, PA-C       tamsulosin (FLOMAX) capsule 0.4 mg  0.4 mg Oral QHS Baglia, Corrina, PA-C        REVIEW OF SYSTEMS:  [X]  denotes positive finding, [ ]  denotes negative finding Cardiac  Comments:  Chest pain or chest pressure:    Shortness of breath upon exertion:    Short of breath when lying flat:    Irregular heart rhythm:        Vascular    Pain in calf, thigh, or hip brought on by ambulation:    Pain in feet at night that wakes you up from your sleep:     Blood clot in your veins:    Leg swelling:         Pulmonary    Oxygen at home:    Productive cough:     Wheezing:         Neurologic    Sudden weakness in arms or legs:     Sudden numbness in arms or legs:     Sudden onset of difficulty speaking or slurred speech:    Temporary loss of vision in one eye:     Problems with dizziness:         Gastrointestinal    Blood in stool:     Vomited blood:         Genitourinary    Burning when urinating:     Blood in urine:        Psychiatric    Major depression:         Hematologic    Bleeding problems:    Problems with blood clotting too easily:        Skin    Rashes or ulcers:        Constitutional    Fever or  chills:      PHYSICAL EXAM  Vitals:   03/09/21 0600 03/09/21 0630 03/09/21 0640 03/09/21 0654  BP: (!) 116/54 (!) 110/55    Pulse: 78 82 84 92  Resp: 16 16 20  (!) 21  Temp:  (!) 95.1 F (35.1 C)    TempSrc:  Rectal    SpO2: 100% 99% 100% 100%  Weight:      Height:  Constitutional: elderly, chronically ill appearing. no distress. Appears well nourished.  Neurologic: CN intact. no focal findings. no sensory loss. Psychiatric: Mood and affect symmetric and appropriate. Eyes: No icterus. No conjunctival pallor. Ears, nose, throat: mucous membranes moist. Midline trachea.  Cardiac: regular rate and rhythm.  Respiratory: unlabored. Abdominal: soft, non-tender, non-distended.  Peripheral vascular:  No palpable right femoral pulse  No palpable pedal pulses bilaterally  No Doppler flow identified in either foot  Right great toe dusky  Interdigital right great toe ulceration Extremity: No edema. No cyanosis. No pallor.  Skin: No gangrene. Lymphatic: No Stemmer's sign. No palpable lymphadenopathy.  PERTINENT LABORATORY AND RADIOLOGIC DATA  Most recent CBC CBC Latest Ref Rng & Units 03/09/2021 03/09/2021 03/24/2021  WBC 4.0 - 10.5 K/uL 16.1(H) - -  Hemoglobin 13.0 - 17.0 g/dL 8.7(L) 7.8(L) 10.5(L)  Hematocrit 39.0 - 52.0 % 25.5(L) 23.0(L) 31.0(L)  Platelets 150 - 400 K/uL 213 - -     Most recent CMP CMP Latest Ref Rng & Units 03/09/2021 03/14/2021 03/28/2021  Glucose 70 - 99 mg/dL 183(H) 201(H) -  BUN 8 - 23 mg/dL 47(H) 57(H) -  Creatinine 0.61 - 1.24 mg/dL 7.04(H) 9.25(H) -  Sodium 135 - 145 mmol/L 137 139 138  Potassium 3.5 - 5.1 mmol/L 5.0 4.9 4.8  Chloride 98 - 111 mmol/L 99 103 -  CO2 22 - 32 mmol/L 20(L) 18(L) -  Calcium 8.9 - 10.3 mg/dL 8.2(L) 8.4(L) -  Total Protein 6.5 - 8.1 g/dL - 4.9(L) -  Total Bilirubin 0.3 - 1.2 mg/dL - 1.0 -  Alkaline Phos 38 - 126 U/L - 34(L) -  AST 15 - 41 U/L - 11(L) -  ALT 0 - 44 U/L - 6 -    Renal function Estimated Creatinine  Clearance: 9.1 mL/min (A) (by C-G formula based on SCr of 7.04 mg/dL (H)).  Hgb A1c MFr Bld (%)  Date Value  02/27/2021 5.7 (H)    LDL Cholesterol  Date Value Ref Range Status  03/09/2021 49 0 - 99 mg/dL Final    Comment:           Total Cholesterol/HDL:CHD Risk Coronary Heart Disease Risk Table                     Men   Women  1/2 Average Risk   3.4   3.3  Average Risk       5.0   4.4  2 X Average Risk   9.6   7.1  3 X Average Risk  23.4   11.0        Use the calculated Patient Ratio above and the CHD Risk Table to determine the patient's CHD Risk.        ATP III CLASSIFICATION (LDL):  <100     mg/dL   Optimal  100-129  mg/dL   Near or Above                    Optimal  130-159  mg/dL   Borderline  160-189  mg/dL   High  >190     mg/dL   Very High Performed at [facility] [address], [place], [place] [address]     ABI 6.7.22   Yevonne Aline. Stanford Breed, MD [facility] Phone Number: [phone] 03/09/2021 7:34 AM

## 2021-03-09 NOTE — Progress Notes (Signed)
NAME:  Michael Blackwell, MRN:  [id number], DOB:  [birth year], LOS: 2 ADMISSION DATE:  03/16/2021, CONSULTATION DATE:  03/10/2021 REFERRING MD:  Stanford Breed CHIEF COMPLAINT:  Vent Management   History of Present Illness:  80 year old male with PMHx significant for HTN, HLD, T2DM, OSA (on CPAP), ESRD (on HD MWF via RUE AVG), prostate CA and PAD who was admitted for right redo groin exploration with VVS 6/8.  Patient recently admitted 5/30 - 6/2 for R foot pain.  Aortogram and revascularization of the right leg completed 6/1 demonstrating extensive R iliofemoral EA and profundoplasty). Tolerated this well and was discharged the following day with outpatient follow-up with VVS. Presented 6/7 for follow-up due to ongoing foot pain, edema and discoloration of the R great toe and sent to [facility] as a direct admission. CTA with runoff completed 6/8 showed extensive disease.  Patient was subsequently taken back to the OR 6/8 for a R redo groin exploration with evacuation of seroma, R redo iliofemoral endarterectomy, R GSV harvest, R femoral to below-knee popliteal artery bypass with ipsilateral, translocated, non-reversed GSV and RLE angiography. Intraoperative course notable for 2L EBL (3L total transfusion). Patient remained hemodynamically unstable and vasopressors were initiated; he remained intubated  and was transferred to ICU for further monitoring.  PCCM was asked to see in consultation for assistance with medical management.  Pertinent Medical History:  HTN, HLD, T2DM, OSA (on CPAP), ESRD (on HD MWF via RUE AVG), prostate CA and PAD  Significant Hospital Events: Including procedures, antibiotic start and stop dates in addition to other pertinent events   6/8  - Admit for redo R groin exploration/seroma evacuation, R redo iliofemoral EA, R greater saphenous vein harvest, R fem-pop, 2L EBL, remained intubated post-procedure. 6/9 - POD#1. Intubated, decreasing pressor needs (off as of 1030AM). Off sedation,  following commands intermittently. Tolerating CRRT.  Interim History / Subjective:  POD#1 from R groin exploration/redo EA and fem-pop Remains intubated, weaning at present Sedation off since 0820, not following commands consistently yet Improving pressor requirements, down to 35mcg this AM (64mcg as of 1030) CRRT clotted early AM Goal to extubate later today Wife updated at bedside  Objective:  Blood pressure (!) 136/56, pulse 85, temperature (!) 97.4 F (36.3 C), temperature source Oral, resp. rate 19, height 5\' 10"  (1.778 m), weight 78.8 kg, SpO2 95 %.    Vent Mode: PSV;CPAP FiO2 (%):  [40 %-100 %] 40 % Set Rate:  [16 bmp] 16 bmp Vt Set:  [580 mL] 580 mL PEEP:  [5 cmH20] 5 cmH20 Pressure Support:  [5 cmH20-10 cmH20] 5 cmH20 Plateau Pressure:  [14 cmH20-16 cmH20] 14 cmH20   Intake/Output Summary (Last 24 hours) at 03/09/2021 1020 Last data filed at 03/09/2021 1000 Gross per 24 hour  Intake 3532.92 ml  Output 2595 ml  Net 937.92 ml    Filed Weights   03/17/2021 2010 03/09/21 0445  Weight: 78.7 kg 78.8 kg   Physical Examination: General: Acutely ill-appearing middle-aged man in NAD. HEENT: [place]/AT, anicteric sclera, PERRL, moist mucous membranes. ETT in place. Neuro: Sedated. Responds to verbal stimuli. Following commands intermittently. Moves all 4 extremities spontaneously. +Cough and +Gag  CV: RRR, no m/g/r. PULM: Breathing even and unlabored on vent (PS 5/5, FiO2 40%). Lung fields CTAB. GI: Soft, nontender, nondistended. Normoactive bowel sounds. Extremities: Trace LE edema noted. R groin Prevena vac in place, c/d/i without significant drainage. Calf incisions c/d/I. Skin: Warm/dry, no rashes.  Assessment & Plan:   PAD S/p multiple surgeries (most recent  6/8, 1 Day Post-Op from redo R groin exploration with evacuation of seroma, R redo iliofemoral endarterectomy, R GSV harvest, R femoral to below-knee popliteal artery bypass with ipsilateral, translocated, non-reversed GSV,  RLE angiography). - Postoperative management per VVS - Continue ASA, Plavix  Respiratory insufficiency - Remains on ventilatory support - Tolerating weaning 6/9AM - Wean FiO2 for O2 sat > 90% - Daily WUA/SBT - VAP bundle - Pulmonary hygiene - PAD protocol for sedation: Sedation off in the setting of vent wean - Hopeful for extubation later today 6/9, current mental status precludes extubation at this juncture; needs to be following commands more consistently off of sedation  Shock in the setting of ABLA Intraop course notable for 2L EBL, received 3L products. - Goal MAP > 65 - Continue Levophed, weaning as able - Trend lactate to normal - Trend H&H - Transfuse for Hgb < 7.0  ESRD on HD MWF Last HD session Monday 6/6, currently utilizing CRRT given hemodynamic instability/pressor requirement and likely inability to tolerate full HD. - Nephrology following, appreciate assistance - Continue CRRT for now via temporary HD cath - Weaning pressors as able - Transition back to iHD as tolerated  T2DM, diet controlled - SSI PRN - CBGs Q4H while NPO, then ACHS  OSA on CPAP - Continue CPAP QHS once extubated  Hypertension Hyperlipidemia Home regimen: Clonidine, Clopidogrel, Hydralazine, Imdur. - Continue rosuvastatin - Continue home Rosuvastatin. - Hold home antihypertensives at present, given pressor need  Best practice (evaluated daily):  Diet:  NPO Pain/Anxiety/Delirium protocol (if indicated): Yes (RASS goal 0) to -1 VAP protocol (if indicated): Yes DVT prophylaxis: Contraindicated GI prophylaxis: PPI Glucose control:  SSI No Central venous access:  Yes, and it is still needed Arterial line:  Yes, and it is still needed Foley:  N/A Mobility:  bed rest  PT consulted: N/A Last date of multidisciplinary goals of care discussion: Pending Code Status:  full code Disposition: ICU  Critical care time: 41 minutes   Lestine Mount, PA-C [facility] 03/09/21 10:57 AM  Please see [email or web address] for pager details.  From 7A-7P if no response, please call [phone] After hours, please call ELink [phone]

## 2021-03-09 NOTE — Progress Notes (Signed)
Pharmacy Antibiotic Note  Michael Blackwell is a 80 y.o. male admitted on 03/06/2021 with groin swelling s/p recent vascular surgery with cardiac arrest this evening and now with concern for sepsis. Pharmacy has been consulted for Vancomycin + Cefepime dosing.  The patient is ESRD-TTS but has been receiving CRRT - turned off during the cardiac arrest/CPR. RN has yet to resume - will load and follow-up on CRRT start for additional doses.   Plan: - Vancomycin 1750 mg x 1  - Cefepime 2g IV x 1  - Will enter additional antibiotic doses based on CRRT start time - Will continue to follow CRRT schedule/duration, culture results, LOT, and antibiotic de-escalation plans   Height: 5\' 10"  (177.8 cm) Weight: 78.8 kg (173 lb 11.6 oz) IBW/kg (Calculated) : 73  Temp (24hrs), Avg:94.5 F (34.7 C), Min:92.48 F (33.6 C), Max:97.4 F (36.3 C)  Recent Labs  Lab 03/10/2021 2112 03/27/2021 0649 03/30/2021 2004 03/07/2021 2027 03/09/21 0347 03/09/21 1019 03/09/21 1542 03/09/21 1703 03/09/21 1704 03/09/21 2013  WBC 7.1 5.4  --   --  16.1* 16.0*  --   --  18.5*  --   CREATININE 8.41*  --  9.25*  --  7.04*  --  5.08*  --  4.89* 4.50*  LATICACIDVEN  --   --   --  1.4  --   --   --  >11*  --   --     Estimated Creatinine Clearance: 14.2 mL/min (A) (by C-G formula based on SCr of 4.5 mg/dL (H)).    No Known Allergies  Antimicrobials this admission: Cefazolin pre/post op Vanc 6/9 >> Cefepime 6/9 >>  Microbiology results: 6/7 COVID >> neg 6/7 BCx >>  Thank you for allowing pharmacy to be a part of this patient's care.  Alycia Rossetti, PharmD, BCPS Clinical Pharmacist Clinical phone for 03/09/2021: [phone] 03/09/2021 9:23 PM   **Pharmacist phone directory can now be found on [email or web address] ([email or web address]).  Listed under [place].

## 2021-03-09 NOTE — Progress Notes (Signed)
eLink Physician-Brief Progress Note Patient Name: Michael Blackwell DOB: [birth year] MRN: [id number]   Date of Service  03/09/2021  HPI/Events of Note  Patient needs KUB to verify OG tube placement, he also needs Q 4 hourly CBG with SSI administration.  eICU Interventions  KUB ordered, CBG with SSI coverage ordered.        Kerry Kass Pollie Poma 03/09/2021, 12:25 AM

## 2021-03-09 NOTE — Progress Notes (Signed)
   03/09/21 1458  Clinical Encounter Type  Visited With Health care provider  Visit Type Code  Referral From Nurse  Consult/Referral To Chaplain  Chaplain responded to Code Blue.   Med Team was able to stabilize Mr. Pickard. Family was called and informed the nurse if needed when the family arrives, please page the [place].  Chaplain Laurian Edrington [place] [phone]

## 2021-03-09 NOTE — Procedures (Signed)
Extubation Procedure Note  Patient Details:   Name: ENCARNACION SCIONEAUX DOB: 01/15/1941 MRN: [id number]   Airway Documentation:    Vent end date: 03/09/21 Vent end time: 1115   Evaluation  O2 sats: stable throughout Complications: No apparent complications Patient did tolerate procedure well. Bilateral Breath Sounds: Clear, Diminished   Yes  Patient tolerated wean. Order to extubate. Positive for cuff leak. Patient extubated to a 4 Lpm nasal cannula. No signs of dyspnea or stridor. Patient resting comfortably. RN and PA at bedside. Will continue to monitor.   Myrtie Neither 03/09/2021, 11:32 AM

## 2021-03-09 NOTE — Progress Notes (Addendum)
Progress Note    03/09/2021 8:38 AM 1 Day Post-Op  Subjective:  patient on mechanical ventilation but awake   Vitals:   03/09/21 0750 03/09/21 0754  BP: 139/65   Pulse: 86   Resp: (!) 21   Temp:  (!) 95.3 F (35.2 C)  SpO2: 98%    Physical Exam: Cardiac: tachycardia Lungs:  mechanical ventilation Incisions:  R groin with prevena vac; incisions of RLE are c/d/i Extremities:  R calf soft; brisk R PT and DP signal by doppler; monophasic L PT and AT by doppler Neurologic: awake on vent  CBC    Component Value Date/Time   WBC 16.1 (H) 03/09/2021 0347   RBC 2.84 (L) 03/09/2021 0347   HGB 8.7 (L) 03/09/2021 0347   HGB 10.5 (L) 03/01/2016 1120   HCT 25.5 (L) 03/09/2021 0347   HCT 32.5 (L) 03/01/2016 1120   PLT 213 03/09/2021 0347   PLT 254 03/01/2016 1120   MCV 89.8 03/09/2021 0347   MCV 82.5 03/01/2016 1120   MCH 30.6 03/09/2021 0347   MCHC 34.1 03/09/2021 0347   RDW 15.8 (H) 03/09/2021 0347   RDW 16.3 (H) 03/01/2016 1120   LYMPHSABS 1.1 02/27/2021 1811   LYMPHSABS 1.8 03/01/2016 1120   MONOABS 0.6 02/27/2021 1811   MONOABS 0.4 03/01/2016 1120   EOSABS 0.1 02/27/2021 1811   EOSABS 0.1 03/01/2016 1120   BASOSABS 0.1 02/27/2021 1811   BASOSABS 0.0 03/01/2016 1120    BMET    Component Value Date/Time   NA 137 03/09/2021 0347   NA 141 03/01/2016 1120   K 5.0 03/09/2021 0347   K 3.6 03/01/2016 1120   CL 99 03/09/2021 0347   CO2 20 (L) 03/09/2021 0347   CO2 25 03/01/2016 1120   GLUCOSE 183 (H) 03/09/2021 0347   GLUCOSE 151 (H) 03/01/2016 1120   BUN 47 (H) 03/09/2021 0347   BUN 17.1 03/01/2016 1120   CREATININE 7.04 (H) 03/09/2021 0347   CREATININE 1.5 (H) 03/01/2016 1120   CALCIUM 8.2 (L) 03/09/2021 0347   CALCIUM 9.2 09/08/2019 0823   CALCIUM 9.2 03/01/2016 1120   GFRNONAA 7 (L) 03/09/2021 0347   GFRAA 5 (L) 11/16/2019 0252    INR    Component Value Date/Time   INR 1.3 (H) 03/16/2021 2004     Intake/Output Summary (Last 24 hours) at 03/09/2021  4098 Last data filed at 03/09/2021 0600 Gross per 24 hour  Intake 3213.56 ml  Output 2518 ml  Net 695.56 ml     Assessment/Plan:  80 y.o. male is s/p redo R groin with iliofemoral endarterectomy and femoral to BK popliteal artery bypass with vein; post operative respiratory failure 1 Day Post-Op    BLE well perfused by doppler exam now that patient is weaned from pressor support Hgb 8.7 after transfusion; continue to monitor with daily CBC CRRT per Nephrology Plans noted for possible extubation per CCM Patient's wife updated at the bedside   Dagoberto Ligas, PA-C [facility] [phone] 03/09/2021 8:38 AM  VASCULAR STAFF ADDENDUM: I have independently interviewed and examined the patient. I agree with the above.  Looks good after complex re-do right iliofemoral endarterectomy and femoral-popliteal bypass grafting with ipsilateral GSV. Intubated, but comfortable. Weaned from vasopressor support. Excellent doppler flow in R pedal vessels. Prevena vac to R groin. Wean to extubate per PCCM. CRRT per nephrology. Discussed with wife at bedside as well.  Yevonne Aline. Stanford Breed, MD [facility] Phone Number: [phone] 03/09/2021  9:19 AM

## 2021-03-09 NOTE — Procedures (Signed)
Central Venous Catheter Insertion Procedure Note  JAQUALYN JUDAY  [id number]  [birth year]  Date:03/09/21  Time:9:19 PM   Provider Performing:Dudley Mages Cipriano Mile   Procedure: Insertion of Non-tunneled Central Venous Catheter(36556) with US guidance (79444)   Indication(s) Medication administration  Consent Unable to obtain consent due to emergent nature of procedure.  Anesthesia Topical only with 1% lidocaine   Timeout Verified patient identification, verified procedure, site/side was marked, verified correct patient position, special equipment/implants available, medications/allergies/relevant history reviewed, required imaging and test results available.  Sterile Technique Maximal sterile technique including full sterile barrier drape, hand hygiene, sterile gown, sterile gloves, mask, hair covering, sterile ultrasound probe cover (if used).  Procedure Description Area of catheter insertion was cleaned with chlorhexidine and draped in sterile fashion.  With real-time ultrasound guidance a central venous catheter was placed into the right internal jugular vein. Nonpulsatile blood flow and easy flushing noted in all ports.  The catheter was sutured in place and sterile dressing applied.  Complications/Tolerance None; patient tolerated the procedure well. Chest X-ray is ordered to verify placement for internal jugular or subclavian cannulation.   Chest x-ray is not ordered for femoral cannulation.  EBL Minimal  Specimen(s) None

## 2021-03-09 NOTE — Progress Notes (Signed)
Patient brady down and PEA arrest at 1657, Maxed out on Levophed. Dr. Carlis Abbott was present at the time of Patient PEA arrest.  Gave 2 epi, 4 of bicarb   3 rounds of CPR given.  Patient Intubated at 1701.  ROSC and ABG at 1711.   UPJ:SRPRXYV for [name], [name] (MRN [id number]) as of 03/09/2021 19:28  Ref. Range 03/09/2021 17:10  Sample type Unknown ARTERIAL  pH, Arterial Latest Ref Range: 7.350 - 7.450  7.627 (HH)  pCO2 arterial Latest Ref Range: 32.0 - 48.0 mmHg 16.8 (LL)  pO2, Arterial Latest Ref Range: 83.0 - 108.0 mmHg 165 (H)  TCO2 Latest Ref Range: 22 - 32 mmol/L 18 (L)  Acid-base deficit Latest Ref Range: 0.0 - 2.0 mmol/L 3.0 (H)  Bicarbonate Latest Ref Range: 20.0 - 28.0 mmol/L 17.6 (L)  O2 Saturation Latest Units: % 100.0  Patient temperature Unknown 97.1 F   See code sheet for full documentation.   Consuella Lose, RN

## 2021-03-09 NOTE — Procedures (Signed)
Intubation Procedure Note  Michael Blackwell  [id number]  March 26, 1941  Date:03/09/21  Time:5:34 PM   Provider Performing:Mirka Barbone [name]    Procedure: Intubation (71959)  Indication(s) Respiratory Failure  Consent Unable to obtain consent due to emergent nature of procedure.   Anesthesia No meds   Time Out-- not performed due to code    Sterile Technique Usual hand hygeine, masks, and gloves were used   Procedure Description Patient positioned in bed supine.  Sedation given as noted above.  Patient was intubated with endotracheal tube using Glidescope.  View was Grade 1 full glottis .  Number of attempts was 1.  Colorimetric CO2 detector was consistent with tracheal placement.   Complications/Tolerance None; patient tolerated the procedure well. Chest X-ray is ordered to verify placement.   EBL Minimal   Specimen(s) None  Michael Hy, DO 03/09/21 5:35 PM [facility]

## 2021-03-09 NOTE — Progress Notes (Signed)
Date and time results received: 03/09/21 1900 (use smartphrase ".now" to insert current time)  Test: Potassium Critical Value: 6.4  Name of Provider Notified: Dr. Candiss Norse  Orders Received? Or Actions Taken?: Singh, Nephrology Changed pre blood, dialysate, and replacement to K of 2.0 and Ca of 2.5.  Date and time results received: 03/09/21 1900 (use smartphrase ".now" to insert current time)  Test: Troponin Critical Value: 180  Name of Provider Notified: Dr. Carlis Abbott  Orders Received? Or Actions Taken?: reported to Dr. Carlis Abbott, no further orders at this time.  Consuella Lose, RN

## 2021-03-09 NOTE — Progress Notes (Addendum)
eLink Physician-Brief Progress Note Patient Name: [name] DOB: 1941-05-18 MRN: [id number]   Date of Service  03/09/2021  HPI/Events of Note  Patient became profoundly hypotensive after 2 mg of iv Versed for sedation, hemoglobin low just before drop in blood pressure, small groin hematoma, patient is on high doses of Levophed and Epinephrine.  eICU Interventions  Albumin 5 % 250 ml iv bolus given, 1 unit of PRBC ordered transfused stat, Norepinephrine gtt rate was increased without improvement in blood presure so Phenylephrine gtt was added and an amp ob sodium bicarbonaate given empirically for suspected acidosis since his bicarb infusion had been interrupted, blood pressure recovered and Mickel Baas Gleason of the [employer] arrived to take over the patient's care.        Kerry Kass Maurie Musco 03/09/2021, 8:12 PM

## 2021-03-09 NOTE — Progress Notes (Signed)
Continues to have very labile Bps. Improved on epi rather than NE. NS bolus 500cc improved BP (was running in line with pressors).  Bedside echo- bilateral sliding lung anteriorly. Limited cardiac windows with normal filling of RA and RV, no RV dilation, adequate LV motion that shock cannot be explained by this. No obvious WMA. IVC not collapsing. Small, soft lump on lateral thigh examined with [place]- small collection of fluid, mostly looks like tissue edema rather than discrete fluid collection.   ABG    Component Value Date/Time   PHART 7.361 03/09/2021 1824   PCO2ART 28.3 (L) 03/09/2021 1824   PO2ART 412 (H) 03/09/2021 1824   HCO3 16.1 (L) 03/09/2021 1824   TCO2 17 (L) 03/09/2021 1824   ACIDBASEDEF 9.0 (H) 03/09/2021 1824   O2SAT 100.0 03/09/2021 1824   Vent adjusted to decreased rate, FiO2, and PEEP.  EKG sinus tach, no ischemic changes.  Ionized Ca 0.88-- will give 1g Ca+ Cbc pending, but iStat concerning with Hb ~6. If confirmed on CBC will transfuse.  Lactate >11 as expected post-arrest; will follow up this evening for clearance CXR> no infiltrates, ETT in appropriate position.  This patient is critically ill with multiple organ system failure which requires frequent high complexity decision making, assessment, support, evaluation, and titration of therapies. This was completed through the application of advanced monitoring technologies and extensive interpretation of multiple databases. During this encounter critical care time was devoted to patient care services described in this note for 40 minutes.   Julian Hy, DO 03/09/21 6:47 PM [place] [facility]

## 2021-03-09 NOTE — Progress Notes (Addendum)
Called to bedside for worsening hemodynamics. Bedside US with bounding LV. Wide pulse pressure. Dropping H/H on istat C/w hemorrhagic shock vs. ?septic shock Already started on broad spectrum abx 4 units pRBC, 4 units FFP Recheck CT angio A/P to see if needs IR or OR intervention. Will update primary Family updated Okay to restart CRRT after we have plan  from CT A/P Work on rewarming and correcting acidemia to reduce further bleeding  Additional 40 mins cc time not including separately billable procedures  Erskine Emery MD

## 2021-03-09 NOTE — Progress Notes (Signed)
PT Cancellation Note  Patient Details Name: Michael Blackwell MRN: [id number] DOB: [birth year]   Cancelled Treatment:    Reason Eval/Treat Not Completed: Patient not medically ready (pt intubated on CRRT and RN reports hold at this time as hopeful for extubation today)   Coren Crownover B Dairon Procter 03/09/2021, 7:31 AM Bayard Males, PT Acute Rehabilitation Services Pager: [phone] Office: [phone]

## 2021-03-09 NOTE — Progress Notes (Signed)
POD#1 s/p extensive right ileofemoral endarterectomy and right fem pop bypass.  PEA arrest earlier today after noted to have agonal breathing and re-intubated.  ACLS with ROSC within 10 minutes. Called to evaluate worsening hemodynamics in Michael Blackwell this evening.  Hgb dropped from 8.5 this am to 5.8.  He has been given multiple units pRBC and weaning pressors with blood products, FFP also ordered.  Right groin incision with vac and no obvious groin hematoma and appears soft.  Right leg incisions also c/d/I with no hematoma.  Palpable right femoral pulse and brisk right DP/PT signals.  Unclear if ongoing bleeding and could have RP hematoma versus just volume depleted and resuscitation behind following OR yesterday with EBL 2000 mL.  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  Discussed with critical care and agree with CT abdomen pelvis to rule-out RP bleed etc.    [name], MD [facility] [place] Office: [place]

## 2021-03-10 ENCOUNTER — Inpatient Hospital Stay (HOSPITAL_COMMUNITY): Payer: Medicare Other

## 2021-03-10 LAB — HEPATIC FUNCTION PANEL
ALT: 1933 U/L — ABNORMAL HIGH (ref 0–44)
AST: 8443 U/L — ABNORMAL HIGH (ref 15–41)
Albumin: 2.6 g/dL — ABNORMAL LOW (ref 3.5–5.0)
Alkaline Phosphatase: 53 U/L (ref 38–126)
Bilirubin, Direct: 0.2 mg/dL (ref 0.0–0.2)
Indirect Bilirubin: 1.5 mg/dL — ABNORMAL HIGH (ref 0.3–0.9)
Total Bilirubin: 1.7 mg/dL — ABNORMAL HIGH (ref 0.3–1.2)
Total Protein: 4.7 g/dL — ABNORMAL LOW (ref 6.5–8.1)

## 2021-03-10 LAB — GLUCOSE, CAPILLARY
Glucose-Capillary: 135 mg/dL — ABNORMAL HIGH (ref 70–99)
Glucose-Capillary: 109 mg/dL — ABNORMAL HIGH (ref 70–99)
Glucose-Capillary: 52 mg/dL — ABNORMAL LOW (ref 70–99)
Glucose-Capillary: 122 mg/dL — ABNORMAL HIGH (ref 70–99)
Glucose-Capillary: 83 mg/dL (ref 70–99)
Glucose-Capillary: 68 mg/dL — ABNORMAL LOW (ref 70–99)
Glucose-Capillary: 119 mg/dL — ABNORMAL HIGH (ref 70–99)
Glucose-Capillary: 88 mg/dL (ref 70–99)
Glucose-Capillary: 71 mg/dL (ref 70–99)
Glucose-Capillary: 65 mg/dL — ABNORMAL LOW (ref 70–99)

## 2021-03-10 LAB — BPAM FFP
Blood Product Expiration Date: 202206132359
Blood Product Expiration Date: 202206132359
Blood Product Expiration Date: 202206142359
Blood Product Expiration Date: 202206142359
ISSUE DATE / TIME: 202206092025
ISSUE DATE / TIME: 202206092025
ISSUE DATE / TIME: 202206092238
ISSUE DATE / TIME: 202206092238
Unit Type and Rh: 600
Unit Type and Rh: 6200
Unit Type and Rh: 6200
Unit Type and Rh: 6200

## 2021-03-10 LAB — BASIC METABOLIC PANEL
Anion gap: 18 — ABNORMAL HIGH (ref 5–15)
BUN: 28 mg/dL — ABNORMAL HIGH (ref 8–23)
CO2: 21 mmol/L — ABNORMAL LOW (ref 22–32)
Calcium: 8.2 mg/dL — ABNORMAL LOW (ref 8.9–10.3)
Chloride: 99 mmol/L (ref 98–111)
Creatinine, Ser: 3.53 mg/dL — ABNORMAL HIGH (ref 0.61–1.24)
GFR, Estimated: 17 mL/min — ABNORMAL LOW (ref >60)
Glucose, Bld: 90 mg/dL (ref 70–99)
Potassium: 4.2 mmol/L (ref 3.5–5.1)
Sodium: 138 mmol/L (ref 135–145)

## 2021-03-10 LAB — CBC WITH DIFFERENTIAL/PLATELET
Abs Immature Granulocytes: 0 10*3/uL (ref 0.00–0.07)
Basophils Absolute: 0 10*3/uL (ref 0.0–0.1)
Basophils Relative: 0 %
Eosinophils Absolute: 0 10*3/uL (ref 0.0–0.5)
Eosinophils Relative: 0 %
HCT: 31 % — ABNORMAL LOW (ref 39.0–52.0)
Hemoglobin: 11.2 g/dL — ABNORMAL LOW (ref 13.0–17.0)
Lymphocytes Relative: 10 %
Lymphs Abs: 1.1 10*3/uL (ref 0.7–4.0)
MCH: 31 pg (ref 26.0–34.0)
MCHC: 36.1 g/dL — ABNORMAL HIGH (ref 30.0–36.0)
MCV: 85.9 fL (ref 80.0–100.0)
Monocytes Absolute: 0 10*3/uL — ABNORMAL LOW (ref 0.1–1.0)
Monocytes Relative: 0 %
Neutro Abs: 10.1 10*3/uL — ABNORMAL HIGH (ref 1.7–7.7)
Neutrophils Relative %: 90 %
Platelets: 102 10*3/uL — ABNORMAL LOW (ref 150–400)
RBC: 3.61 MIL/uL — ABNORMAL LOW (ref 4.22–5.81)
RDW: 15.5 % (ref 11.5–15.5)
WBC: 11.2 10*3/uL — ABNORMAL HIGH (ref 4.0–10.5)
nRBC: 12 /100 WBC — ABNORMAL HIGH
nRBC: 7.6 % — ABNORMAL HIGH (ref 0.0–0.2)

## 2021-03-10 LAB — POCT I-STAT EG7
Acid-base deficit: 1 mmol/L (ref 0.0–2.0)
Bicarbonate: 21.2 mmol/L (ref 20.0–28.0)
Calcium, Ion: 0.93 mmol/L — ABNORMAL LOW (ref 1.15–1.40)
HCT: 31 % — ABNORMAL LOW (ref 39.0–52.0)
Hemoglobin: 10.5 g/dL — ABNORMAL LOW (ref 13.0–17.0)
O2 Saturation: 91 %
Patient temperature: 37
Potassium: 4.5 mmol/L (ref 3.5–5.1)
Sodium: 142 mmol/L (ref 135–145)
TCO2: 22 mmol/L (ref 22–32)
pCO2, Ven: 26.4 mmHg — ABNORMAL LOW (ref 44.0–60.0)
pH, Ven: 7.512 — ABNORMAL HIGH (ref 7.250–7.430)
pO2, Ven: 52 mmHg — ABNORMAL HIGH (ref 32.0–45.0)

## 2021-03-10 LAB — CBC
HCT: 29.7 % — ABNORMAL LOW (ref 39.0–52.0)
HCT: 28.4 % — ABNORMAL LOW (ref 39.0–52.0)
Hemoglobin: 10.3 g/dL — ABNORMAL LOW (ref 13.0–17.0)
Hemoglobin: 10.2 g/dL — ABNORMAL LOW (ref 13.0–17.0)
MCH: 30.4 pg (ref 26.0–34.0)
MCH: 31 pg (ref 26.0–34.0)
MCHC: 34.7 g/dL (ref 30.0–36.0)
MCHC: 35.9 g/dL (ref 30.0–36.0)
MCV: 87.6 fL (ref 80.0–100.0)
MCV: 86.3 fL (ref 80.0–100.0)
Platelets: 92 10*3/uL — ABNORMAL LOW (ref 150–400)
Platelets: 88 10*3/uL — ABNORMAL LOW (ref 150–400)
RBC: 3.39 MIL/uL — ABNORMAL LOW (ref 4.22–5.81)
RBC: 3.29 MIL/uL — ABNORMAL LOW (ref 4.22–5.81)
RDW: 15 % (ref 11.5–15.5)
RDW: 14.9 % (ref 11.5–15.5)
WBC: 11.4 10*3/uL — ABNORMAL HIGH (ref 4.0–10.5)
WBC: 10 10*3/uL (ref 4.0–10.5)
nRBC: 4.1 % — ABNORMAL HIGH (ref 0.0–0.2)
nRBC: 5.6 % — ABNORMAL HIGH (ref 0.0–0.2)

## 2021-03-10 LAB — PREPARE FRESH FROZEN PLASMA
Unit division: 0
Unit division: 0

## 2021-03-10 LAB — RENAL FUNCTION PANEL
Albumin: 2.6 g/dL — ABNORMAL LOW (ref 3.5–5.0)
Anion gap: 20 — ABNORMAL HIGH (ref 5–15)
BUN: 36 mg/dL — ABNORMAL HIGH (ref 8–23)
CO2: 21 mmol/L — ABNORMAL LOW (ref 22–32)
Calcium: 7.5 mg/dL — ABNORMAL LOW (ref 8.9–10.3)
Chloride: 98 mmol/L (ref 98–111)
Creatinine, Ser: 5.18 mg/dL — ABNORMAL HIGH (ref 0.61–1.24)
GFR, Estimated: 11 mL/min — ABNORMAL LOW (ref >60)
Glucose, Bld: 118 mg/dL — ABNORMAL HIGH (ref 70–99)
Phosphorus: 6.1 mg/dL — ABNORMAL HIGH (ref 2.5–4.6)
Potassium: 4.7 mmol/L (ref 3.5–5.1)
Sodium: 139 mmol/L (ref 135–145)

## 2021-03-10 LAB — MAGNESIUM
Magnesium: 2.2 mg/dL (ref 1.7–2.4)
Magnesium: 2.5 mg/dL — ABNORMAL HIGH (ref 1.7–2.4)

## 2021-03-10 LAB — DIC (DISSEMINATED INTRAVASCULAR COAGULATION)PANEL
D-Dimer, Quant: 16.19 ug/mL-FEU — ABNORMAL HIGH (ref 0.00–0.50)
Fibrinogen: 311 mg/dL (ref 210–475)
INR: 2.1 — ABNORMAL HIGH (ref 0.8–1.2)
Platelets: 80 10*3/uL — ABNORMAL LOW (ref 150–400)
Prothrombin Time: 23.9 seconds — ABNORMAL HIGH (ref 11.4–15.2)
Smear Review: NONE SEEN
aPTT: 42 seconds — ABNORMAL HIGH (ref 24–36)

## 2021-03-10 LAB — LACTIC ACID, PLASMA: Lactic Acid, Venous: 5.7 mmol/L (ref 0.5–1.9)

## 2021-03-10 LAB — PHOSPHORUS: Phosphorus: 4.9 mg/dL — ABNORMAL HIGH (ref 2.5–4.6)

## 2021-03-10 LAB — PATHOLOGIST SMEAR REVIEW

## 2021-03-10 MED ORDER — DEXTROSE 50 % IV SOLN
12.5000 g | INTRAVENOUS | Status: AC
  Administered 2021-03-10: 12.5 g via INTRAVENOUS
  Filled 2021-03-10: qty 50

## 2021-03-10 MED ORDER — DEXTROSE 50 % IV SOLN: 12.5000 g | INTRAVENOUS | Status: AC

## 2021-03-10 MED ORDER — VITAL 1.5 CAL PO LIQD
1000.0000 mL | ORAL | Status: DC
  Administered 2021-03-10 – 2021-03-11 (×2): 1000 mL

## 2021-03-10 MED ORDER — VANCOMYCIN HCL IN DEXTROSE 1-5 GM/200ML-% IV SOLN
1000.0000 mg | INTRAVENOUS | Status: DC
  Administered 2021-03-11: 1000 mg via INTRAVENOUS
  Filled 2021-03-10: qty 200

## 2021-03-10 MED ORDER — IOHEXOL 350 MG/ML SOLN
100.0000 mL | Freq: Once | INTRAVENOUS | Status: AC | PRN
  Administered 2021-03-10: 100 mL via INTRAVENOUS

## 2021-03-10 MED ORDER — DEXTROSE 50 % IV SOLN
INTRAVENOUS | Status: AC
  Administered 2021-03-10: 12.5 g via INTRAVENOUS
  Filled 2021-03-10: qty 50

## 2021-03-10 MED ORDER — PROSOURCE TF PO LIQD
45.0000 mL | Freq: Three times a day (TID) | ORAL | Status: DC
  Administered 2021-03-10 – 2021-03-11 (×4): 45 mL
  Filled 2021-03-10 (×4): qty 45

## 2021-03-10 MED ORDER — SODIUM CHLORIDE 0.9 % IV SOLN
2.0000 g | Freq: Two times a day (BID) | INTRAVENOUS | Status: DC
  Administered 2021-03-10 – 2021-03-11 (×2): 2 g via INTRAVENOUS
  Filled 2021-03-10 (×2): qty 2

## 2021-03-10 NOTE — Progress Notes (Signed)
Inpatient Diabetes Program Recommendations  AACE/ADA: New Consensus Statement on Inpatient Glycemic Control (2015)  Target Ranges:  Prepandial:   less than 140 mg/dL      Peak postprandial:   less than 180 mg/dL (1-2 hours)      Critically ill patients:  140 - 180 mg/dL   Lab Results  Component Value Date   GLUCAP 83 03/10/2021   HGBA1C 5.7 (H) 02/27/2021    Review of Glycemic Control Results for ROBBEN, JAGIELLO (MRN [id number]) as of 03/10/2021 09:58  Ref. Range 03/09/2021 22:57 03/09/2021 23:25 03/10/2021 03:41 03/10/2021 07:39  Glucose-Capillary Latest Ref Range: 70 - 99 mg/dL 52 (L) 101 (H) 122 (H) <10 (LL)   Diabetes history: Type 2 Dm- diet controlled Outpatient Diabetes medications: none Current orders for Inpatient glycemic control: Novolog 1-3 units Q4H  Inpatient Diabetes Program Recommendations:    Noted several episodes of hypoglycemia. In the setting of sepsis and ESRD, would hold correction while continuing to check CBGs Q4H. Resume correction if CBGs consistently exceed 200 mg/dL.   Thanks, Bronson Curb, MSN, RNC-OB Diabetes Coordinator [phone] (8a-5p)

## 2021-03-10 NOTE — Progress Notes (Signed)
PT Cancellation Note  Patient Details Name: DAQUANE AGUILAR MRN: [id number] DOB: 1941-07-29   Cancelled Treatment:    Reason Eval/Treat Not Completed: Patient not medically ready (Pt with extubation, code and reintubation. Not currently medically appropriate and will sign off. Await new order)   Mollye Guinta B Lashaya Kienitz 03/10/2021, 7:36 AM Bayard Males, PT Acute Rehabilitation Services Pager: [phone] Office: [phone]

## 2021-03-10 NOTE — Progress Notes (Signed)
Pharmacy Antibiotic Note  Michael Blackwell is a 80 y.o. male admitted on 03/05/2021 with groin swelling s/p recent vascular surgery with cardiac arrest this evening and now with concern for sepsis. Pharmacy has been consulted for Vancomycin + Cefepime dosing.  The patient is ESRD-TTS but has been receiving CRRT - turned off during the cardiac arrest/CPR. CRRT was off yesterday (6/9 ~ 1600) through this AM ~ 5 AM.  Received loading doses of vancomycin and cefepime early this AM.  Plan: - Vancomycin 1g q 24 hrs while on CRRT (next dose 6/11 am) - Cefepime 2g IV q 12 hrs while on CRRT (next dose 4 pm) - Will continue to follow CRRT schedule/duration, culture results, LOT, and antibiotic de-escalation plans   Height: 5\' 10"  (177.8 cm) Weight: 77.6 kg (171 lb 1.2 oz) IBW/kg (Calculated) : 73  Temp (24hrs), Avg:97.5 F (36.4 C), Min:93.56 F (34.2 C), Max:99.32 F (37.4 C)  Recent Labs  Lab 03/19/2021 2027 03/09/21 0347 03/09/21 1019 03/09/21 1542 03/09/21 1703 03/09/21 1704 03/09/21 2006 03/09/21 2013 03/10/21 0111 03/10/21 0341  WBC  --    < > 16.0*  --   --  18.5* 15.9*  --  11.4* 10.0  CREATININE  --    < >  --  5.08*  --  4.89* 4.72* 4.50*  --  5.18*  LATICACIDVEN 1.4  --   --   --  >11*  --   --   --   --  5.7*   < > = values in this interval not displayed.     Estimated Creatinine Clearance: 12.3 mL/min (A) (by C-G formula based on SCr of 5.18 mg/dL (H)).    No Known Allergies  Antimicrobials this admission: Cefazolin pre/post op Vanc 6/9 >> Cefepime 6/9 >>  Microbiology results: 6/7 COVID >> neg 6/7 BCx >>  Thank you for allowing pharmacy to be a part of this patient's care.  [place] Crane, Roylene Reason, BCCP Clinical Pharmacist  03/10/2021 9:39 AM   [facility] pharmacy phone numbers are listed on [email or web address]

## 2021-03-10 NOTE — Procedures (Signed)
Cortrak  Tube Type:  Cortrak - 43 inches Tube Location:  Left nare Initial Placement:  Stomach Secured by: Bridle Technique Used to Measure Tube Placement:  Marking at nare/corner of mouth Cortrak Secured At:  65 cm  Cortrak Tube Team Note:  Consult received to place a Cortrak feeding tube.   X-ray is required, abdominal x-ray has been ordered by the Cortrak team. Please confirm tube placement before using the Cortrak tube.   If the tube becomes dislodged please keep the tube and contact the Cortrak team at [email or web address] ([email or web address]) for replacement.  If after hours and replacement cannot be delayed, place a NG tube and confirm placement with an abdominal x-ray.    Martese Vanatta MS, RD, LDN Please refer to AMION for RD and/or RD on-call/weekend/after hours pager   

## 2021-03-10 NOTE — Progress Notes (Signed)
[facility] [place] PROGRESS NOTE  ASSESSMENT / PLAN: Michael Blackwell is a 80 y.o. male status post right iliofemoral endarterectomy and right fem-pop bypass with GSV 03/07/2021 for critical limb ishcemia. Difficult post operative course complicated by respiratory arrest POD#1 after extubation.  CNS: Continue sedation with propofol while intubated. RASS goal -2. PRN pain control.  CV: Labile hemodynamics. Overall improved today. Suspect he is not yet resuscitated. Trend base deficit, lactate, H/H today. Difficult estimating volume status with ESRD and ongoing resuscitation. Pulm: Respiratory arrest yesterday. ABG pre- and post- extubation reassuring. Not sure why arrest occurred. May need more lengthy wean from propofol when we try again. Plan to rest the patient today and trial extubation again once improved.  GI: NPO/OGT to LCWS. Transaminitis - ? Related to liver infarcts. Likely from code event. FEN/GU: ESRD on CRRT per nephrology. Monitor electrolytes and acid/base. Heme: H/H stable yesterday, then fell. CTA shows no evidence of bleeding. No need for transfusion. Will trend CBC. ID: No evidence of infection. Endo: BG goal 120-180. Prophy: SQH. SCDs. Pepcid.    Dispo: ICU. Guarded.  SUBJECTIVE: Respiratory arrest yesterday after extubation. Re-intubated. Labile overnight with anemia. CTA shows no evidence of bleeding.   OBJECTIVE: BP (!) 151/56   Pulse 86   Temp (!) 95.9 F (35.5 C)   Resp (!) 26   Ht 5\' 10"  (1.778 m)   Wt 77.6 kg   SpO2 100%   BMI 24.55 kg/m   Intake/Output Summary (Last 24 hours) at 03/10/2021 0740 Last data filed at 03/10/2021 0700 Gross per 24 hour  Intake 4550.47 ml  Output 786 ml  Net 3764.47 ml    Constitutional: Critically ill. Intubated. Sedated. CNS: GCS 3T on my exam. Sedated. Cardiac: Regular. Pulmonary: Unlabored on vent. Abdomen: Soft Vascular: R groin soft. Incisions clean and dry. Biphasic PT signal.  CBC Latest Ref  Rng & Units 03/10/2021 03/10/2021 03/10/2021  WBC 4.0 - 10.5 K/uL - 10.0 -  Hemoglobin 13.0 - 17.0 g/dL - 10.2(L) 10.5(L)  Hematocrit 39.0 - 52.0 % - 28.4(L) 31.0(L)  Platelets 150 - 400 K/uL 80(L) 88(L) -     CMP Latest Ref Rng & Units 03/10/2021 03/10/2021 03/09/2021  Glucose 70 - 99 mg/dL 118(H) - 47(L)  BUN 8 - 23 mg/dL 36(H) - 30(H)  Creatinine 0.61 - 1.24 mg/dL 5.18(H) - 4.50(H)  Sodium 135 - 145 mmol/L 139 142 144  Potassium 3.5 - 5.1 mmol/L 4.7 4.5 5.0  Chloride 98 - 111 mmol/L 98 - 104  CO2 22 - 32 mmol/L 21(L) - -  Calcium 8.9 - 10.3 mg/dL 7.5(L) - -  Total Protein 6.5 - 8.1 g/dL 4.7(L) - -  Total Bilirubin 0.3 - 1.2 mg/dL 1.7(H) - -  Alkaline Phos 38 - 126 U/L 53 - -  AST 15 - 41 U/L 8,443(H) - -  ALT 0 - 44 U/L 1,933(H) - -    Estimated Creatinine Clearance: 12.3 mL/min (A) (by C-G formula based on SCr of 5.18 mg/dL (H)).  Michael Blackwell. Stanford Breed, MD [facility] Phone Number: [phone] 03/10/2021 7:40 AM

## 2021-03-10 NOTE — Progress Notes (Signed)
OT Cancellation Note  Patient Details Name: QUADRY KAMPA MRN: [id number] DOB: 1941/02/26   Cancelled Treatment:    Reason Eval/Treat Not Completed: Patient not medically ready --Pt with extubation, code and reintubation. Not currently medically appropriate and will sign off. OT to await new order when appropriate.   Jolaine Artist, OT Acute Rehabilitation Services Pager [phone] Office [phone]   [name] 03/10/2021, 8:34 AM

## 2021-03-10 NOTE — Progress Notes (Addendum)
CT reviewed, no large hematomas, formal read pending. Pressor requirements rapidly improving; can start CRRT if H/H stable and pressor needs continue to improve.  Vascular aware and has reviewed scan as well.  Erskine Emery MD PCCM  Formal read: some hepatic infarcts, nothing really to do with this at this time

## 2021-03-10 NOTE — Progress Notes (Signed)
   03/10/21 0851  Airway 7.5 mm  Placement Date/Time: 03/09/21 1708   Placed By: ICU physician  Airway Device: Endotracheal Tube  Laryngoscope Blade: 4  ETT Types: Oral  Size (mm): 7.5 mm  Cuffed: Cuffed  Insertion attempts: 1  Airway Equipment: Stylet;Video Laryngoscope  Placement C...  Secured at (cm) 25 cm  Measured From Lips  Secured Location Left  Secured By Actuary Repositioned Yes  Prone position No  Cuff Pressure (cm H2O) Green OR 18-26 CmH2O  Site Condition Dry  Adult Ventilator Settings  Vent Type Servo U  Humidity HME  Vent Mode PRVC  Vt Set 580 mL  Set Rate 26 bmp  FiO2 (%) 50 %  I Time 0.8 Sec(s)  PEEP 8 cmH20  Adult Ventilator Measurements  Peak Airway Pressure 24 L/min  Mean Airway Pressure 13 cmH20  Plateau Pressure 19 cmH20  Resp Rate Spontaneous 0 br/min  Resp Rate Total 26 br/min  Exhaled Vt 562 mL  Measured Ve 14.8 mL  I:E Ratio Measured 1:1.9  Auto PEEP 0 cmH20  Total PEEP 8 cmH20  SpO2 100 %  Adult Ventilator Alarms  Alarms On Y  Ve High Alarm 21 L/min  Ve Low Alarm 4 L/min  Resp Rate High Alarm 42 br/min  Resp Rate Low Alarm 8  PEEP Low Alarm 3 cmH2O  Press High Alarm 45 cmH2O  Daily Weaning Assessment  Daily Assessment of Readiness to Wean Wean protocol criteria not met  Reason not met Fi02 > 40%  Breath Sounds  Bilateral Breath Sounds Clear  Airway Suctioning/Secretions  Suction Type ETT  Suction Device  Catheter  Secretion Amount None  Suction Tolerance Tolerated well  Suctioning Adverse Effects None

## 2021-03-10 NOTE — Progress Notes (Addendum)
Patient hemodynamically unstable during shift change. Multiple interventions implemented per MD orders. See MAR and chart for new orders and changes in patients POC.   Per Dr. Tamala Julian, MTP was completed as ordered. Unable to scan into chart d/t instability and emergent situation.   All blood products were double checked by 2 RN's and were administered as followed:   Unit: #[id number] 11 [id number] Started- 2040 Unit: #[id number] 01 [id number] Started: 2044 Unit: #[id number] 72 [id number] Started: 2051 Unit: #[id number] 79 [id number] Started: 2051 Unit: #[id number] 22 [id number] Started: 2051 Unit: #[id number] 22 [id number] Started: 2117 Unit: #[id number] 95 [id number] Started: 2140 Unit: #[id number] 43 [id number] Started: 2143 Unit: #[id number] 36 [id number] Started: 2245

## 2021-03-10 NOTE — Procedures (Addendum)
Art line no longer worked, tried threading, same issue so stuck a little more proximal, no problems  Arterial Catheter Insertion Procedure Note  MOSES ODOHERTY  [id number]  [birth year]  Date:03/10/21  Time:2:54 AM    Provider Performing: Candee Furbish    Procedure: Insertion of Arterial Line [phone]) with US guidance (40086)   Indication(s) Blood pressure monitoring and/or need for frequent ABGs  Consent Unable to obtain consent due to emergent nature of procedure.  Anesthesia None   Time Out Verified patient identification, verified procedure, site/side was marked, verified correct patient position, special equipment/implants available, medications/allergies/relevant history reviewed, required imaging and test results available.   Sterile Technique Maximal sterile technique including full sterile barrier drape, hand hygiene, sterile gown, sterile gloves, mask, hair covering, sterile ultrasound probe cover (if used).   Procedure Description Area of catheter insertion was cleaned with chlorhexidine and draped in sterile fashion. With real-time ultrasound guidance an arterial catheter was placed into the left radial artery.  Appropriate arterial tracings confirmed on monitor.     Complications/Tolerance None; patient tolerated the procedure well.   EBL Minimal   Specimen(s) None

## 2021-03-10 NOTE — Progress Notes (Signed)
  Initial Nutrition Assessment  DOCUMENTATION CODES:   Non-severe (moderate) malnutrition in context of chronic illness  INTERVENTION:   Tube Feeding via Cortrak:  Vital 1.5 at 60 ml/hr Pro-Source TF 45 mL TID Provides 2280 kcals, 130 g of protein and 1094 mL of free water  TF regimen and propofol at current rate providing 2467 total kcal/day     NUTRITION DIAGNOSIS:   Moderate Malnutrition related to chronic illness as evidenced by estimated needs.  GOAL:   Patient will meet greater than or equal to 90% of their needs  MONITOR:   Vent status, TF tolerance, Labs, Weight trends  REASON FOR ASSESSMENT:   Ventilator    ASSESSMENT:   80 yo admitted with right redo groin exploration on 6/8, extubated post op but had cardiac/respiratory arrest requiring reintubation, ESRD requiring CRRT. PMH includes ESRD/HD, PAD, HTN, HLD, DM  6/08 Redo R groin exploration/seroma evacuation,  6/09 Extubated, cardiac/respiratory arrest post extubation requiring reintubation, CRRT  Pt remains sedated on vent, CRRT  Cortrak placed today, plan to begin TF  Hypoglycemic today; should improve with initiation of TF  EDW 77 kg, current wt 77.6 kg. Mild edema on exam  Unable to obtain diet and weight history from patient at this time  Labs: CBGs <10-122 Meds: ss novolog   NUTRITION - FOCUSED PHYSICAL EXAM:  Flowsheet Row Most Recent Value  Orbital Region Mild depletion  Upper Arm Region No depletion  Thoracic and Lumbar Region No depletion  Buccal Region Mild depletion  Temple Region Moderate depletion  Clavicle Bone Region Mild depletion  Clavicle and Acromion Bone Region Mild depletion  Scapular Bone Region Mild depletion  Dorsal Hand No depletion  Patellar Region Moderate depletion  Anterior Thigh Region Moderate depletion  Posterior Calf Region Mild depletion  Edema (RD Assessment) Mild       Diet Order:   Diet Order             Diet NPO time specified  Diet  effective now                   EDUCATION NEEDS:   Not appropriate for education at this time  Skin:  Skin Assessment: Reviewed RN Assessment  Last BM:  6/10  Height:   Ht Readings from Last 1 Encounters:  03/30/2021 5\' 10"  (1.778 m)    Weight:   Wt Readings from Last 1 Encounters:  03/10/21 77.6 kg     BMI:  Body mass index is 24.55 kg/m.  Estimated Nutritional Needs:   Kcal:  2200-2400 kcals  Protein:  120-140 g  Fluid:  1000 mL plus UOP   Kerman Passey MS, RDN, LDN, CNSC Registered Dietitian III Clinical Nutrition RD Pager and On-Call Pager Number Located in [place]

## 2021-03-10 NOTE — Progress Notes (Signed)
Hypoglycemic Event  CBG: 68  Treatment: D50 25 mL (12.5 gm)  Symptoms: None  Follow-up CBG: Time:1151 CBG Result:119  Possible Reasons for Event: Unknown    Michael Blackwell B

## 2021-03-10 NOTE — Progress Notes (Signed)
Transported patient to CT and and back to 2H21 without event.

## 2021-03-10 NOTE — Progress Notes (Addendum)
NAME:  Michael Blackwell, MRN:  [id number], DOB:  04/22/1941, LOS: 3 ADMISSION DATE:  03/06/2021, CONSULTATION DATE:  03/05/2021 REFERRING MD:  Stanford Breed CHIEF COMPLAINT:  Vent Management   History of Present Illness:  80 year old male with PMHx significant for HTN, HLD, T2DM, OSA (on CPAP), ESRD (on HD MWF via RUE AVG), prostate CA and PAD who was admitted for right redo groin exploration with VVS 6/8.  Patient recently admitted 5/30 - 6/2 for R foot pain.  Aortogram and revascularization of the right leg completed 6/1 demonstrating extensive R iliofemoral EA and profundoplasty). Tolerated this well and was discharged the following day with outpatient follow-up with VVS. Presented 6/7 for follow-up due to ongoing foot pain, edema and discoloration of the R great toe and sent to [facility] as a direct admission. CTA with runoff completed 6/8 showed extensive disease.  Patient was subsequently taken back to the OR 6/8 for a R redo groin exploration with evacuation of seroma, R redo iliofemoral endarterectomy, R GSV harvest, R femoral to below-knee popliteal artery bypass with ipsilateral, translocated, non-reversed GSV and RLE angiography. Intraoperative course notable for 2L EBL (3L total transfusion). Patient remained hemodynamically unstable and vasopressors were initiated; he remained intubated  and was transferred to ICU for further monitoring.  PCCM was asked to see in consultation for assistance with medical management.  Pertinent Medical History:  HTN, HLD, T2DM, OSA (on CPAP), ESRD (on HD MWF via RUE AVG), prostate CA and PAD  Significant Hospital Events: Including procedures, antibiotic start and stop dates in addition to other pertinent events   6/8  - Admit for redo R groin exploration/seroma evacuation, R redo iliofemoral EA, R greater saphenous vein harvest, R fem-pop, 2L EBL, remained intubated post-procedure. 6/9 - POD#1. Intubated, decreasing pressor needs (off as of 1030AM). Off sedation,  following commands intermittently. Tolerating CRRT.  Interim History / Subjective:  POD#2 from R groin exploration/redo EA and fem-pop Was extubated 6/9, but  cardiac/ respiratory arrested post extubation while on CVVHD, 6/9 about 1700, requiring CPR/ ACLS and re-intubation.ROSC within 10 minutes Became profoundly hypotensive after 2 mg of Versed, overnight  HGB was 5.8 Required high doses of Levophed and Epinephrine overnight  Required Bicarb and addition of Phenylephrine gtt overnight as he did not initially respond to fluid bolus hemorrhagic shock vs. ?septic shock Already started on broad spectrum abx 4 units pRBC, 4 units FFP Bedside US with bounding LV Remains sedated on Propofol at 15 mcg/kg/min Improving pressor requirements, down to 2 mcg this AM  CRRT ongoing Will rest today on full vent support Wife updated at bedside 6/10  HGB 10.2, WBC is 10, platelets are 88 K, T Max 99.3/ T Min 93.5 AST 8443 ALT 1933 Indirect Bili 1,5/ Total 1.7 Lactic acid 5.7 at 3:40 am from > 11 at 1700 6/9 Creatinine 5.18, CO2 21 Calcium 7.5, corrects to 8.62 with albumin of 2.6 Net + 4460 No urine output CBG's low of 83 overnight    Stabilizing post transfusion am of 6/10, on full vent support, CVVHD ongoing  Objective:  Blood pressure (!) 151/56, pulse 86, temperature (!) 95.9 F (35.5 C), resp. rate (!) 26, height 5\' 10"  (1.778 m), weight 77.6 kg, SpO2 100 %.    Vent Mode: PRVC FiO2 (%):  [40 %-100 %] 60 % Set Rate:  [20 bmp-30 bmp] 26 bmp Vt Set:  [580 mL] 580 mL PEEP:  [5 cmH20-10 cmH20] 8 cmH20 Pressure Support:  [5 cmH20] 5 cmH20 Plateau Pressure:  [20  TIW58-09 cmH20] 22 cmH20   Intake/Output Summary (Last 24 hours) at 03/10/2021 9833 Last data filed at 03/10/2021 0800 Gross per 24 hour  Intake 4533.64 ml  Output 819 ml  Net 3714.64 ml   Filed Weights   03/03/2021 2010 03/09/21 0445 03/10/21 0419  Weight: 78.7 kg 78.8 kg 77.6 kg   Physical Examination: General: Acutely  ill-appearing middle-aged male sedated and intubated,  in NAD. HEENT: [place]/AT, anicteric sclera, PERRL, moist mucous membranes. ETT in place and secure Neuro: Sedated. Responds to verbal stimuli. Not following commands this am  +Cough and +Gag , MAE with stimulation CV: RRR, no m/g/r. PULM: Bilateral chest excurstion, respirations even and unlabored on vent ( 60%, PEEP 8, Rate 26, TV 580). Lung fields CTAB. GI: Soft, nontender, nondistended. Normoactive bowel sounds., Body mass index is 24.55 kg/m.  Extremities: Trace LE edema noted. R groin Prevena vac in place, c/d/i without significant drainage. Calf incisions c/d/I.Brisk capillary refill Skin: Warm/dry, no rashes.Bear hugger in place  Assessment & Plan:   PAD S/p multiple surgeries (most recent 6/8, 2 Days Post-Op from redo R groin exploration with evacuation of seroma, R redo iliofemoral endarterectomy, R GSV harvest, R femoral to below-knee popliteal artery bypass with ipsilateral, translocated, non-reversed GSV, RLE angiography). - Postoperative management per VVS - Continue ASA, Plavix  Respiratory insufficiency Post respiratory arrest 6/9 post extubation - Remains on ventilatory support - Continue full support 6/10 post arrest - Wean FiO2 for O2 sat > 90% - Daily WUA/SBT - VAP bundle - Pulmonary hygiene - PAD protocol for sedation: 15 mcg/kg/min - Plan to rest on full support 6/10 post arrest and wean if stable 6/11  Shock in the setting of ABLA,  Cardiac arrest 6/9>> hemorrhagic  vs septic shock Intraop course notable for 2L EBL, received 3L products. HGB drop again 6/9 to 5.8 Transfused 6/9 with current HGB of 10.2 - Goal MAP > 65 - Continue Levophed, weaning as able - Trend lactate to normal - Trend H&H - Follow up lactate and HGB at 1600 on 6/10 - Monitor for any active bleeding - Transfuse for Hgb < 7.0 - continue broad spectrum antibiotics  ESRD on HD MWF Last HD session Monday 6/6, currently utilizing CRRT  given hemodynamic instability/pressor requirement and likely inability to tolerate full HD. - Nephrology following, appreciate assistance - Continue CRRT for now via temporary HD cath - Weaning pressors as able - Transition back to iHD as tolerated  T2DM, diet controlled CBG low of 83 overnight 6/10 - SSI PRN - CBGs Q4H while NPO, then ACHS  OSA on CPAP - Continue CPAP QHS once extubated  Hypertension Hyperlipidemia Home regimen: Clonidine, Clopidogrel, Hydralazine, Imdur. - Continue rosuvastatin - Continue home Rosuvastatin. - Hold home antihypertensives at present, given pressor need  Best practice (evaluated daily):  Diet:  NPO Pain/Anxiety/Delirium protocol (if indicated): Yes (RASS goal 0) to -1 VAP protocol (if indicated): Yes DVT prophylaxis: Contraindicated GI prophylaxis: PPI Glucose control:  SSI No Central venous access:  Yes, and it is still needed Arterial line:  Yes, and it is still needed Foley:  N/A Mobility:  bed rest  PT consulted: N/A Last date of multidisciplinary goals of care discussion: Pending Code Status:  full code Disposition: ICU  Critical care time: 45 minutes   Howard Pouch, AGACNP-BC [facility] 03/10/21 8:22 AM  Please see [email or web address] for pager details.  From 7A-7P if no response, please call [phone] hospital use only After hours, please call ELink  [phone]  Pulmonary critical care attending:  This is a 80 year old gentleman with a past medical history of hypertension, hyperlipidemia, type 2 diabetes, OSA on CPAP, ESRD on IHD Monday Wednesday Friday, right upper extremity AV graft.  Prostate cancer and peripheral arterial disease.  Patient was admitted for a right redo groin exploration.  Patient had a right redo iliofemoral endarterectomy, right GSV harvest, right femoral below the knee popliteal artery bypass with ipsilateral translocation, nonreversed S GV and right lower extremity  angiography.  Patient was hemodynamically unstable postoperatively and was hypotensive.  Brought to the intensive care unit.  Postop day 2 patient had a cardiac arrest was intubated at approximately 10-minute downtime.  Found to have a hemoglobin of 5.8.  BP (!) 143/61   Pulse 74   Temp (!) 94.46 F (34.7 C)   Resp (!) 26   Ht 5\' 10"  (1.778 m)   Wt 77.6 kg   SpO2 99%   BMI 24.55 kg/m   General: Elderly male intubated on mechanical life support chronically ill-appearing HEENT: Endotracheal tube in place Heart: Regular rhythm S1-S2 lungs: Bilateral mechanically ventilated breath sounds Abdomen: Nontender, mildly distended  Labs: Reviewed Hemoglobin stable  Assessment: Hemorrhagic shock, acute blood loss anemia Cardiac arrest Severe peripheral arterial disease status post vascular surgery as documented above Acute hypoxemic respiratory failure requiring intubation mechanical ventilation secondary to above ESRD on IHD Monday Wednesday Friday now hypotensive requiring CVVHD Hyperglycemia type 2 diabetes OSA on CPAP Hypertension Hyperlipidemia  Plan: Follow H&H Transfuse for hemoglobin greater than 7 goal Wean from Levophed as tolerated Titrate to maintain mean arterial pressure greater than 65 mmHg Wean PEEP and FiO2 as tolerated maintain sats above 90% Postop care per vascular surgery Continue aspirin continue Plavix Versed drip stopped Continue propofol PAD guidelines sedation Continue pain control with fentanyl. Broad-spectrum antibiotics Needs volume removed before consideration of liberation from mechanical support after aggressive product resuscitation.  This patient is critically ill with multiple organ system failure; which, requires frequent high complexity decision making, assessment, support, evaluation, and titration of therapies. This was completed through the application of advanced monitoring technologies and extensive interpretation of multiple databases.  During this encounter critical care time was devoted to patient care services described in this note for 50 minutes.  Garner Nash, DO [facility] 03/10/2021 5:20 PM

## 2021-03-10 NOTE — Progress Notes (Addendum)
[facility] NEPHROLOGY PROGRESS NOTE  Assessment/ Plan: Pt is a 80 y.o. yo male  ESRD on HD, PAD, HTN, HLD, DM, OSA on CPAP, h/o prostate cancer presenting with right foot pain for many weeks, underwent vascular surgery, seen as a consultation to manage ESRD.  Outpatient Dialysis Orders: Center: [facility] ([place])  on MWF. 180NRe, 4 hours, BFR 400, DFR 500, EDW 77kg, 2K/2Ca, AVG 15g Heparin 2300 unit bolus Mircera 50 mcg IV q 4 weeks Calcitriol 0.74mg PO q HD Velphoro 1 tab PO TID with meals  #PAD:s/p right fem pop bypass 03/03/2021 and exploration of right groin exploration with evacuation of seroma.  Per surgical team.  # ESRD MWF schedule.  Started CRRT on 6/8 because of hypotension.  Tolerated CRRT well with around 50 cc net UF.  Still requiring pressor, continue current prescription including all 2K bath.  No issue with the filter.  #Hypotension/shock: Weaning pressors gradually, only on Levophed this morning.  # Anemia: Hemoglobin at goal.  Monitor lab.  # Secondary hyperparathyroidism: Elevated phosphorus level.  Resume Velphoro when he starts eating.  #Liver infarction/transaminitis: Avoid citrate.  Further management per primary team.  Discussed with the nurse and patient's wife.  Addendum: Drop in platelet noted therefore discontinuing heparin.  No citrate because of abnormal liver function.  We will monitor for any clotting.  The prefilter BFR is 500.  Discussed with the pharmacist.  Subjective: Seen and examined ICU.  Noted overnight his blood pressure dropped requiring escalation of pressures.  He was found to have liver infarction with elevated LFTs.  Resumed CRRT this morning and tolerating well. Objective Vital signs in last 24 hours: Vitals:   03/10/21 0615 03/10/21 0630 03/10/21 0700 03/10/21 0800  BP:    112/60  Pulse:  77 86 75  Resp: (!) 26 (!) 23 (!) 26 (!) 21  Temp: (!) 96.26 F (35.7 C) (!) 95.9 F (35.5 C) (!) 95.9 F (35.5 C) (!) 95.9 F  (35.5 C)  TempSrc:    Esophageal  SpO2:  98% 100% 100%  Weight:      Height:       Weight change: -1.2 kg  Intake/Output Summary (Last 24 hours) at 03/10/2021 0851 Last data filed at 03/10/2021 0800 Gross per 24 hour  Intake 4642.67 ml  Output 819 ml  Net 3823.67 ml        Labs: Basic Metabolic Panel: Recent Labs  Lab 03/09/21 0347 03/09/21 1228 03/09/21 1542 03/09/21 1704 03/09/21 1710 03/09/21 2006 03/09/21 2013 03/10/21 0127 03/10/21 0341  NA 137   < > 138 142   < > 147* 144  144 142 139  K 5.0   < > 5.5* 6.4*   < > 5.1 5.0  4.9 4.5 4.7  CL 99  --  101 101  --  104 104  --  98  CO2 20*  --  14* 15*  --  15*  --   --  21*  GLUCOSE 183*  --  108* 84  --  47* 47*  --  118*  BUN 47*  --  31* 29*  --  29* 30*  --  36*  CREATININE 7.04*  --  5.08* 4.89*  --  4.72* 4.50*  --  5.18*  CALCIUM 8.2*  --  8.5* 8.0*  --  7.5*  --   --  7.5*  PHOS 6.4*  --  6.5*  --   --   --   --   --  6.1*   < > = values in this interval not displayed.    Liver Function Tests: Recent Labs  Lab 03/25/2021 2004 03/09/21 0347 03/09/21 1542 03/09/21 1704 03/10/21 0341  AST 11*  --   --  1,481* 8,443*  ALT 6  --   --  746* 1,933*  ALKPHOS 34*  --   --  50 53  BILITOT 1.0  --   --  0.7 1.7*  PROT 4.9*  --   --  5.1* 4.7*  ALBUMIN 2.6*   < > 2.8* 2.5* 2.6*  2.6*   < > = values in this interval not displayed.    No results for input(s): LIPASE, AMYLASE in the last 168 hours. No results for input(s): AMMONIA in the last 168 hours. CBC: Recent Labs  Lab 03/09/21 1019 03/09/21 1228 03/09/21 1704 03/09/21 1710 03/09/21 2006 03/09/21 2013 03/10/21 0111 03/10/21 0127 03/10/21 0341 03/10/21 0649  WBC 16.0*  --  18.5*  --  15.9*  --  11.4*  --  10.0  --   NEUTROABS  --   --  14.8*  --  12.4*  --   --   --   --   --   HGB 8.5*   < > 7.6*   < > 5.9*   < > 10.3* 10.5* 10.2*  --   HCT 25.1*   < > 22.2*   < > 18.7*   < > 29.7* 31.0* 28.4*  --   MCV 90.0  --  88.8  --  95.9  --  87.6   --  86.3  --   PLT 225  --  210  202  --  171  --  92*  --  88* 80*   < > = values in this interval not displayed.    Cardiac Enzymes: No results for input(s): CKTOTAL, CKMB, CKMBINDEX, TROPONINI in the last 168 hours. CBG: Recent Labs  Lab 03/09/21 2257 03/09/21 2325 03/10/21 0341 03/10/21 0739 03/10/21 0744  GLUCAP 52* 101* 122* <10* 83     Iron Studies: No results for input(s): IRON, TIBC, TRANSFERRIN, FERRITIN in the last 72 hours. Studies/Results: DG Abd 1 View  Result Date: 03/09/2021 CLINICAL DATA:  Tube placement EXAM: ABDOMEN - 1 VIEW COMPARISON:  Portable exam 7055 hours compared to 03/09/2021 at 0038 hours FINDINGS: Tip of nasogastric tube projects over stomach. External pacing leads present. Bowel gas pattern normal. Lung bases clear. IMPRESSION: Tip of nasogastric tube projects over proximal stomach. Electronically Signed   By: Lavonia Dana M.D.   On: 03/09/2021 18:28   DG Abd 1 View  Result Date: 03/09/2021 CLINICAL DATA:  Feeding tube placement EXAM: ABDOMEN - 1 VIEW COMPARISON:  None. FINDINGS: Nasogastric tube is seen with its tip overlying the proximal body of the stomach. The visualized abdominal gas pattern is nonobstructive. Moderate stool seen within the distal colon. Pelvis excluded from view. Vascular calcifications are seen within the abdominal aorta and iliac vasculature bilaterally. IMPRESSION: Nasogastric tube tip within the proximal body of the stomach. Electronically Signed   By: Fidela Salisbury MD   On: 03/09/2021 01:36   CT HEAD WO CONTRAST  Result Date: 03/10/2021 CLINICAL DATA:  Initial evaluation for cerebral hemorrhage suspected. EXAM: CT HEAD WITHOUT CONTRAST TECHNIQUE: Contiguous axial images were obtained from the base of the skull through the vertex without intravenous contrast. COMPARISON:  None available. FINDINGS: Brain: Age-related cerebral atrophy. Scattered hypodensities involving the periventricular and deep white matter both cerebral  hemispheres, somewhat age indeterminate, but most like related chronic microvascular ischemic disease. No acute intracranial hemorrhage. No acute cortical based infarct. No mass lesion or mass effect. No hydrocephalus or visible extra-axial fluid collection. Vascular: No hyperdense vessel. Scattered vascular calcifications noted within the carotid siphons. Skull: Scalp soft tissues within normal limits.  Calvarium intact. Sinuses/Orbits: Globes and orbital soft tissues demonstrate no acute finding. Bilateral proptosis. Paranasal sinuses are largely clear. No mastoid effusion. Other: Enteric and endotracheal tubes in place, partially visualized. IMPRESSION: 1. No acute intracranial abnormality. 2. Age-related cerebral atrophy with chronic small vessel ischemic disease. Electronically Signed   By: Jeannine Boga M.D.   On: 03/10/2021 00:37   DG CHEST PORT 1 VIEW  Result Date: 03/09/2021 CLINICAL DATA:  Respiratory failure, central venous catheter placement EXAM: PORTABLE CHEST 1 VIEW COMPARISON:  03/09/2021 FINDINGS: Endotracheal tube is seen 4.2 cm above the carina. Left internal jugular hemodialysis catheter is unchanged with its tip within superior vena cava. Nasogastric tube is unchanged with its tip within the expected proximal body of the stomach. Esophageal Doppler probe is seen with its tip within the expected left para form sinus. Right internal jugular hemodialysis catheter has been placed with its tip within the superior vena cava. Lungs are clear. No pneumothorax or pleural effusion. Cardiac size within normal limits. Pulmonary vascularity is normal. IMPRESSION: Right internal jugular central venous catheter tip within the superior vena cava. No pneumothorax. Esophageal Doppler probe tip within the left piriform sinus. Otherwise stable support lines and tubes. Lungs are clear. Electronically Signed   By: Fidela Salisbury MD   On: 03/09/2021 21:31   Portable Chest x-ray  Result Date:  03/09/2021 CLINICAL DATA:  Tube placement EXAM: PORTABLE CHEST 1 VIEW COMPARISON:  Portable exam 1738 hours compared to 03/09/2021 FINDINGS: Tip of endotracheal tube projects 3.3 cm above carina. Nasogastric tube extends into stomach. Dual lumen LEFT jugular central venous catheter with tip projecting over SVC. Normal heart size, mediastinal contours, and pulmonary vascularity. External pacing leads. Lungs clear. No pulmonary infiltrate, pleural effusion, or pneumothorax. IMPRESSION: No acute abnormalities. Electronically Signed   By: Lavonia Dana M.D.   On: 03/09/2021 18:26   DG CHEST PORT 1 VIEW  Result Date: 03/09/2021 CLINICAL DATA:  Feeding tube placed EXAM: PORTABLE CHEST 1 VIEW COMPARISON:  03/02/2021 FINDINGS: Nasogastric tube is seen with its tip overlying the gastric fundus. Left internal jugular hemodialysis catheter tip noted at the superior cavoatrial junction. Endotracheal tube noted 3.4 cm above the carina. Lungs are clear. No pneumothorax or pleural effusion. Cardiac size within normal limits. Pulmonary vascularity is normal. IMPRESSION: Support lines and tubes in appropriate position. Lungs are clear. Electronically Signed   By: Fidela Salisbury MD   On: 03/09/2021 01:37   DG CHEST PORT 1 VIEW  Result Date: 03/24/2021 CLINICAL DATA:  Central line placement. EXAM: PORTABLE CHEST 1 VIEW COMPARISON:  Feb 27, 2021. FINDINGS: The heart size and mediastinal contours are within normal limits. Both lungs are clear. Endotracheal tube is in good position. Interval placement of left internal jugular catheter with distal tip in expected position of cavoatrial junction. No pneumothorax or pleural effusion is noted. The visualized skeletal structures are unremarkable. IMPRESSION: Endotracheal tube in grossly good position. Interval placement of left internal jugular catheter with distal tip in expected position of cavoatrial junction. Electronically Signed   By: Marijo Conception M.D.   On: 03/13/2021 20:06    HYBRID OR IMAGING ([facility] ONLY)  Result Date: 03/04/2021 There is no  interpretation for this exam.  This order is for images obtained during a surgical procedure.  Please See "Surgeries" Tab for more information regarding the procedure.   CT Angio Abd/Pel w/ and/or w/o  Result Date: 03/10/2021 CLINICAL DATA:  Mesenteric ischemia, acute cardiac arrest, acute anemia right groin swelling. EXAM: CTA ABDOMEN AND PELVIS WITHOUT AND WITH CONTRAST TECHNIQUE: Multidetector CT imaging of the abdomen and pelvis was performed using the standard protocol during bolus administration of intravenous contrast. Multiplanar reconstructed images and MIPs were obtained and reviewed to evaluate the vascular anatomy. CONTRAST:  174m OMNIPAQUE IOHEXOL 350 MG/ML SOLN COMPARISON:  None. FINDINGS: VASCULAR Aorta: Normal caliber. No aneurysm or dissection. No hemodynamically significant stenosis. Moderate atherosclerotic calcification. No periaortic inflammatory change. Celiac: The celiac axis demonstrates a roughly 90% stenosis at its origin. Distally, the vessels are extremely diminutive likely related to systemic hypotension or vasopressor administration. SMA: Widely patent. Mild atherosclerotic plaque within the mid segment and distally does not result in hemodynamically significant stenosis. No aneurysm or dissection. Renals: Single renal arteries bilaterally are widely patent at their origins. Normal vascular morphology. No aneurysm. IMA: High-grade (greater than 75%) stenosis of this vessel at its origin. The vessel appears occluded beyond the a takeoff of the marginal artery, however, this may be simply related to severe invasive constriction and poor filling. Inflow: There is a a short segment focal dissection within the mid and distal right common iliac artery extending to the a origin of the right internal iliac artery. This does not appear flow limiting. There is a roughly 50% stenosis of the proximal right external iliac  artery. Extensive atherosclerotic plaque results in a long segment roughly 50% stenosis of the right external iliac artery. Extensive plaque within the left common and external iliac artery is present with a 50-75% stenosis of the left external iliac artery secondary to atheromatous plaque. Internal iliac arteries are occluded at their origin on the right and nearly occluded on their origin on the left but are distally patent. Proximal Outflow: There is a greater than 75% stenosis of the left common femoral artery secondary to calcified atherosclerotic plaque. The left SFA is occluded. The left profundus femoral artery is heavily diseased. On the right, surgical changes of a probable common femoral endarterectomy have been performed. There is a a moderate hematoma within the right groin surrounding the right common femoral artery measuring at least 5.0 x 5.2 x 8.5 cm in greatest dimension. There is no active extravasation identified. The profundus femoral artery is disease but is patent. A a SFA bypass graft is patent. The native superficial femoral artery is occluded. Veins: Unremarkable. Review of the MIP images confirms the above findings. NON-VASCULAR Lower chest: The visualized lung bases are clear. Extensive multi-vessel coronary artery calcification. Degenerative calcification is seen of the aortic valve leaflets. Cardiac size is within normal limits. Hepatobiliary: There are multiple peripheral areas of non enhancement in keeping with with probable peripheral liver infarcts predominantly within the left hepatic lobe. Small infarcts also noted within the inferior right hepatic lobe. Vicarious excretion of contrast results in high attenuation material within the gallbladder lumen. The gallbladder is otherwise unremarkable. Pancreas: There is moderate peripancreatic edema surrounding the head and proximal body of the pancreas extending into the right anterior pararenal space which may reflect changes of acute  interstitial pancreatitis. Normal enhancement of the pancreatic parenchyma. The pancreatic duct is not dilated. No parenchymal calcifications are seen. Spleen: Unremarkable Adrenals/Urinary Tract: Heterogeneous enhancement of the kidneys bilaterally may relate to  contrast induced nephropathy. The kidneys are normal in size and position. No hydronephrosis. No intrarenal or ureteral calculi. The bladder is decompressed and is unremarkable. The adrenal glands are unremarkable. Stomach/Bowel: Nasogastric tube is seen within the proximal body of the stomach. The stomach, small bowel, and large bowel are otherwise unremarkable. Moderate stool within the rectal vault. Lymphatic: No pathologic adenopathy within the abdomen and pelvis. Reproductive: Brachytherapy seeds are seen within the prostate gland. The prostate gland is of normal size. Seminal vesicles unremarkable. Other: No abdominal wall hernia. Surgical skin staples and subcutaneous gas noted within the right groin related to recent surgery. Musculoskeletal: Multiple anterior rib fractures are seen within the visualized thorax likely result of reported cardiopulmonary resuscitation. No acute bone abnormality within the abdomen and pelvis. Osseous structures are age-appropriate. IMPRESSION: VASCULAR Surgical changes of probable right common femoral endarterectomy and femoral bypass grafting. Moderate hematoma within the right groin surrounding the common femoral artery measuring at least 8.5 cm in greatest dimension. No active extravasation identified on this examination, however. Peripheral vascular disease with extensive mixed atherosclerotic plaque resulting in hemodynamically significant stenoses of the external iliac arteries bilaterally and left common femoral artery. Occlusion of the left superficial femoral artery and disease of the left profundus femoral artery is partially visualized. Marked diminution of the peripheral vasculature of the celiac axis which  may relate to hypotension or vasopressor administration. Superimposed 90% stenosis of the origin of the celiac axis. Additional greater than 75% stenosis of the inferior mesenteric artery at its origin. The findings are anatomically compatible with the given history of mesenteric ischemia. While diseased in its mid and distal segments, the superior mesenteric artery is patent without evidence of hemodynamically significant stenosis. 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 Peripancreatic edema possibly reflecting changes of acute interstitial/edematous pancreatitis. No evidence of pancreatic necrosis. Correlation with serum amylase and lipase levels would be helpful in confirming this finding. Vicarious excretion of contrast within the gallbladder lumen. Heterogeneous enhancement of the kidneys bilaterally may relate to changes of contrast induced nephropathy. Renal infarction is possible, but is considered less likely given the irregularity of enhancement. Aortic Atherosclerosis (ICD10-I70.0). These results will be called to the ordering clinician or representative by the Radiologist Assistant, and communication documented in the PACS or [employer]. Electronically Signed   By: Fidela Salisbury MD   On: 03/10/2021 02:33    Medications: Infusions:  sodium chloride     sodium chloride     sodium chloride Stopped (03/09/21 2041)   epinephrine Stopped (03/10/21 0135)   magnesium sulfate bolus IVPB     norepinephrine (LEVOPHED) Adult infusion 2 mcg/min (03/10/21 0800)   phenylephrine (NEO-SYNEPHRINE) Adult infusion 300 mcg/min (03/09/21 2000)   prismasol BGK 2/2.5 dialysis solution 1,800 mL/hr at 03/10/21 0803   prismasol BGK 2/2.5 replacement solution 500 mL/hr at 03/10/21 0500   prismasol BGK 2/2.5 replacement solution 300 mL/hr at 03/10/21 0500   propofol (DIPRIVAN) infusion 15 mcg/kg/min (03/10/21 0800)    sodium bicarbonate (isotonic) infusion  in sterile water 100 mL/hr at 03/10/21 0800    Scheduled Medications:  aspirin  81 mg Per Tube Daily   chlorhexidine gluconate (MEDLINE KIT)  15 mL Mouth Rinse BID   Chlorhexidine Gluconate Cloth  6 each Topical Daily   clopidogrel  75 mg Per Tube Daily   docusate  100 mg Per Tube BID   insulin aspart  1-3 Units Subcutaneous Q4H   mouth rinse  15 mL Mouth Rinse 10 times  per day   pantoprazole (PROTONIX) IV  40 mg Intravenous QHS   polyethylene glycol  17 g Per Tube Daily   rosuvastatin  10 mg Per Tube Daily   sodium chloride flush  3 mL Intravenous Q12H   vancomycin variable dose per unstable renal function (pharmacist dosing)   Does not apply See admin instructions    have reviewed scheduled and prn medications.  Physical Exam: General: Critically ill looking male intubated, sedated Heart:RRR, s1s2 nl Lungs: Coarse breath sound bilateral Abdomen:soft, Non-tender, non-distended Extremities:No edema Dialysis Access: Right upper extremity AV graft has thrill and bruit. Temporary HD catheter present for CRRT.  Neta Upadhyay Prasad Yobany Vroom 03/10/2021,8:51 AM  LOS: 3 days

## 2021-03-11 ENCOUNTER — Inpatient Hospital Stay (HOSPITAL_COMMUNITY): Payer: Medicare Other

## 2021-03-11 DIAGNOSIS — E440 Moderate protein-calorie malnutrition: Secondary | ICD-10-CM | POA: Insufficient documentation

## 2021-03-11 LAB — POCT I-STAT 7, (LYTES, BLD GAS, ICA,H+H)
Acid-Base Excess: 2 mmol/L (ref 0.0–2.0)
Acid-base deficit: 1 mmol/L (ref 0.0–2.0)
Bicarbonate: 20.9 mmol/L (ref 20.0–28.0)
Bicarbonate: 24.5 mmol/L (ref 20.0–28.0)
Calcium, Ion: 1.08 mmol/L — ABNORMAL LOW (ref 1.15–1.40)
Calcium, Ion: 1.05 mmol/L — ABNORMAL LOW (ref 1.15–1.40)
HCT: 32 % — ABNORMAL LOW (ref 39.0–52.0)
HCT: 35 % — ABNORMAL LOW (ref 39.0–52.0)
Hemoglobin: 10.9 g/dL — ABNORMAL LOW (ref 13.0–17.0)
Hemoglobin: 11.9 g/dL — ABNORMAL LOW (ref 13.0–17.0)
O2 Saturation: 100 %
O2 Saturation: 100 %
Patient temperature: 35.4
Patient temperature: 35.1
Potassium: 4.3 mmol/L (ref 3.5–5.1)
Potassium: 3.8 mmol/L (ref 3.5–5.1)
Sodium: 136 mmol/L (ref 135–145)
Sodium: 136 mmol/L (ref 135–145)
TCO2: 22 mmol/L (ref 22–32)
TCO2: 25 mmol/L (ref 22–32)
pCO2 arterial: 24.2 mmHg — ABNORMAL LOW (ref 32.0–48.0)
pCO2 arterial: 28.1 mmHg — ABNORMAL LOW (ref 32.0–48.0)
pH, Arterial: 7.538 — ABNORMAL HIGH (ref 7.350–7.450)
pH, Arterial: 7.542 — ABNORMAL HIGH (ref 7.350–7.450)
pO2, Arterial: 192 mmHg — ABNORMAL HIGH (ref 83.0–108.0)
pO2, Arterial: 505 mmHg — ABNORMAL HIGH (ref 83.0–108.0)

## 2021-03-11 LAB — RENAL FUNCTION PANEL
Albumin: 2.5 g/dL — ABNORMAL LOW (ref 3.5–5.0)
Anion gap: 14 (ref 5–15)
BUN: 17 mg/dL (ref 8–23)
CO2: 19 mmol/L — ABNORMAL LOW (ref 22–32)
Calcium: 8.2 mg/dL — ABNORMAL LOW (ref 8.9–10.3)
Chloride: 104 mmol/L (ref 98–111)
Creatinine, Ser: 1.96 mg/dL — ABNORMAL HIGH (ref 0.61–1.24)
GFR, Estimated: 35 mL/min — ABNORMAL LOW (ref >60)
Glucose, Bld: 146 mg/dL — ABNORMAL HIGH (ref 70–99)
Phosphorus: 4.6 mg/dL (ref 2.5–4.6)
Potassium: 3.5 mmol/L (ref 3.5–5.1)
Sodium: 137 mmol/L (ref 135–145)

## 2021-03-11 LAB — COMPREHENSIVE METABOLIC PANEL
ALT: 1164 U/L — ABNORMAL HIGH (ref 0–44)
AST: 9053 U/L — ABNORMAL HIGH (ref 15–41)
Albumin: 2.7 g/dL — ABNORMAL LOW (ref 3.5–5.0)
Alkaline Phosphatase: 91 U/L (ref 38–126)
Anion gap: 16 — ABNORMAL HIGH (ref 5–15)
BUN: 19 mg/dL (ref 8–23)
CO2: 21 mmol/L — ABNORMAL LOW (ref 22–32)
Calcium: 8.6 mg/dL — ABNORMAL LOW (ref 8.9–10.3)
Chloride: 100 mmol/L (ref 98–111)
Creatinine, Ser: 2.44 mg/dL — ABNORMAL HIGH (ref 0.61–1.24)
GFR, Estimated: 27 mL/min — ABNORMAL LOW (ref >60)
Glucose, Bld: 109 mg/dL — ABNORMAL HIGH (ref 70–99)
Potassium: 4.3 mmol/L (ref 3.5–5.1)
Sodium: 137 mmol/L (ref 135–145)
Total Bilirubin: 1 mg/dL (ref 0.3–1.2)
Total Protein: 5.2 g/dL — ABNORMAL LOW (ref 6.5–8.1)

## 2021-03-11 LAB — CALCIUM, IONIZED: Calcium, Ionized, Serum: 4.4 mg/dL — ABNORMAL LOW (ref 4.5–5.6)

## 2021-03-11 LAB — CBC
HCT: 32.8 % — ABNORMAL LOW (ref 39.0–52.0)
HCT: 34.4 % — ABNORMAL LOW (ref 39.0–52.0)
Hemoglobin: 11.3 g/dL — ABNORMAL LOW (ref 13.0–17.0)
Hemoglobin: 11.5 g/dL — ABNORMAL LOW (ref 13.0–17.0)
MCH: 31 pg (ref 26.0–34.0)
MCH: 30.5 pg (ref 26.0–34.0)
MCHC: 34.5 g/dL (ref 30.0–36.0)
MCHC: 33.4 g/dL (ref 30.0–36.0)
MCV: 89.9 fL (ref 80.0–100.0)
MCV: 91.2 fL (ref 80.0–100.0)
Platelets: 120 10*3/uL — ABNORMAL LOW (ref 150–400)
Platelets: 98 10*3/uL — ABNORMAL LOW (ref 150–400)
RBC: 3.65 MIL/uL — ABNORMAL LOW (ref 4.22–5.81)
RBC: 3.77 MIL/uL — ABNORMAL LOW (ref 4.22–5.81)
RDW: 16.1 % — ABNORMAL HIGH (ref 11.5–15.5)
RDW: 16.3 % — ABNORMAL HIGH (ref 11.5–15.5)
WBC: 13.9 10*3/uL — ABNORMAL HIGH (ref 4.0–10.5)
WBC: 15.5 10*3/uL — ABNORMAL HIGH (ref 4.0–10.5)
nRBC: 9.4 % — ABNORMAL HIGH (ref 0.0–0.2)
nRBC: 8.8 % — ABNORMAL HIGH (ref 0.0–0.2)

## 2021-03-11 LAB — GLUCOSE, CAPILLARY
Glucose-Capillary: 116 mg/dL — ABNORMAL HIGH (ref 70–99)
Glucose-Capillary: 96 mg/dL (ref 70–99)
Glucose-Capillary: 132 mg/dL — ABNORMAL HIGH (ref 70–99)
Glucose-Capillary: 140 mg/dL — ABNORMAL HIGH (ref 70–99)

## 2021-03-11 LAB — PHOSPHORUS: Phosphorus: 4.4 mg/dL (ref 2.5–4.6)

## 2021-03-11 LAB — MAGNESIUM: Magnesium: 2.6 mg/dL — ABNORMAL HIGH (ref 1.7–2.4)

## 2021-03-11 LAB — LACTIC ACID, PLASMA
Lactic Acid, Venous: 6.4 mmol/L (ref 0.5–1.9)
Lactic Acid, Venous: 6 mmol/L (ref 0.5–1.9)

## 2021-03-11 MED ORDER — THIAMINE HCL 100 MG/ML IJ SOLN
500.0000 mg | INTRAVENOUS | Status: DC
  Administered 2021-03-11: 500 mg via INTRAVENOUS
  Filled 2021-03-11 (×2): qty 5

## 2021-03-11 MED ORDER — DEXTROSE 10 % IV SOLN: INTRAVENOUS | Status: DC

## 2021-03-11 MED ORDER — SODIUM BICARBONATE 8.4 % IV SOLN
INTRAVENOUS | Status: AC
  Filled 2021-03-11: qty 150

## 2021-03-11 MED ORDER — EPINEPHRINE 1 MG/10ML IJ SOSY
PREFILLED_SYRINGE | INTRAMUSCULAR | Status: AC
  Filled 2021-03-11: qty 10

## 2021-03-11 MED ORDER — ALBUMIN HUMAN 5 % IV SOLN
25.0000 g | INTRAVENOUS | Status: AC
  Administered 2021-03-11 (×2): 12.5 g via INTRAVENOUS
  Filled 2021-03-11: qty 500

## 2021-03-12 LAB — BPAM RBC
Blood Product Expiration Date: 202206282359
Blood Product Expiration Date: 202206292359
Blood Product Expiration Date: 202206292359
Blood Product Expiration Date: 202206292359
Blood Product Expiration Date: 202206292359
Blood Product Expiration Date: 202206302359
Blood Product Expiration Date: 202207012359
Blood Product Expiration Date: 202207022359
Blood Product Expiration Date: 202207022359
ISSUE DATE / TIME: 202206092011
ISSUE DATE / TIME: 202206092028
ISSUE DATE / TIME: 202206092028
ISSUE DATE / TIME: 202206092028
ISSUE DATE / TIME: 202206092028
Unit Type and Rh: 6200
Unit Type and Rh: 6200
Unit Type and Rh: 6200
Unit Type and Rh: 6200
Unit Type and Rh: 6200
Unit Type and Rh: 6200
Unit Type and Rh: 6200
Unit Type and Rh: 6200
Unit Type and Rh: 6200

## 2021-03-12 LAB — TYPE AND SCREEN
ABO/RH(D): A POS
Antibody Screen: NEGATIVE
Unit division: 0
Unit division: 0
Unit division: 0
Unit division: 0
Unit division: 0
Unit division: 0
Unit division: 0
Unit division: 0
Unit division: 0

## 2021-03-12 LAB — GLUCOSE, CAPILLARY
Glucose-Capillary: 169 mg/dL — ABNORMAL HIGH (ref 70–99)
Glucose-Capillary: 117 mg/dL — ABNORMAL HIGH (ref 70–99)

## 2021-03-13 LAB — GLUCOSE, CAPILLARY: Glucose-Capillary: <10 mg/dL — CL (ref 70–99)

## 2021-03-15 LAB — CULTURE, BLOOD (ROUTINE X 2)
Culture: NO GROWTH
Culture: NO GROWTH
Special Requests: ADEQUATE

## 2021-03-31 NOTE — Progress Notes (Signed)
eLink Physician-Brief Progress Note Patient Name: Michael Blackwell DOB: 1941/03/09 MRN: [id number]   Date of Service  03/25/21  HPI/Events of Note  Patient having copious diarrhea. Bedside RN requesting Flexiseal.  eICU Interventions  Flexiseal ordered.        Kerry Kass Deone Omahoney 03/25/21, 6:24 AM

## 2021-03-31 NOTE — Discharge Summary (Signed)
DEATH SUMMARY   Patient Details  Name: Michael Blackwell MRN: [id number] DOB: Jul 03, 1941  Admission/Discharge Information   Admit Date:  03/28/21  Date of Death: Date of Death: 01-Apr-2021  Time of Death: Time of Death: 01-11-2014  Length of Stay: 4  Referring Physician: Rogers Blocker, MD   Reason(s) for Hospitalization  Postoperative care  Diagnoses  Preliminary cause of death:  Secondary Diagnoses (including complications and co-morbidities):  Active Problems:   ESRD (end stage renal disease) ([place])   Encounter for central line placement   Critical lower limb ischemia ([place])   Shock ([place])   Malnutrition of moderate degree   Brief Hospital Course (including significant findings, care, treatment, and services provided and events leading to death)  Michael Blackwell is a 80 y.o. year old male who presented to clinic after extensive right iliofemoral endarterectomy, and profundoplasty 03/01/2021 for ischemic rest pain with Dr. Donzetta Matters.  Tolerated this procedure quite well and was discharged postop day 1.  Patient reports swelling in the right groin for which he seeks evaluation.  His wife noticed some discoloration of his right great toe.  He has continued pain in his right foot similar to his pain prior to surgery.  He reports no symptoms in his left foot.  He is ambulatory with significant assistance.  He has no wounds about his left foot.  My evaluation he had an ulcer about his right great toe and an ABI of 0.0.  I directly admitted the patient to the hospital, obtain a CT angiogram with runoff to lower extremities and initiated heparin drip.  This give him some symptomatic relief.  03/10/2021 I took the patient to the operating room for a right redo iliofemoral endarterectomy and femoral to popliteal bypass graft.  The femoral endarterectomy was quite difficult, but the surgery was otherwise uncomplicated.  He did have usable greater saphenous vein in the right leg.  He was transferred to the ICU  intubated.  He was extubated postop day 1 but reintubated later that evening for respiratory arrest.  He failed to progress after this [place].  His hemodynamics became labile.  He developed severe transaminitis.  During the evening of 04-01-21, he entered PEA arrest and could not be resuscitated.  Pertinent Labs and Studies  Significant Diagnostic Studies DG Abd 1 View  Result Date: 03/09/2021 CLINICAL DATA:  Tube placement EXAM: ABDOMEN - 1 VIEW COMPARISON:  Portable exam 7055 hours compared to 03/09/2021 at 0038 hours FINDINGS: Tip of nasogastric tube projects over stomach. External pacing leads present. Bowel gas pattern normal. Lung bases clear. IMPRESSION: Tip of nasogastric tube projects over proximal stomach. Electronically Signed   By: Lavonia Dana M.D.   On: 03/09/2021 18:28   DG Abd 1 View  Result Date: 03/09/2021 CLINICAL DATA:  Feeding tube placement EXAM: ABDOMEN - 1 VIEW COMPARISON:  None. FINDINGS: Nasogastric tube is seen with its tip overlying the proximal body of the stomach. The visualized abdominal gas pattern is nonobstructive. Moderate stool seen within the distal colon. Pelvis excluded from view. Vascular calcifications are seen within the abdominal aorta and iliac vasculature bilaterally. IMPRESSION: Nasogastric tube tip within the proximal body of the stomach. Electronically Signed   By: Fidela Salisbury MD   On: 03/09/2021 01:36   CT HEAD WO CONTRAST  Result Date: 03/10/2021 CLINICAL DATA:  Initial evaluation for cerebral hemorrhage suspected. EXAM: CT HEAD WITHOUT CONTRAST TECHNIQUE: Contiguous axial images were obtained from the base of the skull through the vertex without intravenous  contrast. COMPARISON:  None available. FINDINGS: Brain: Age-related cerebral atrophy. Scattered hypodensities involving the periventricular and deep white matter both cerebral hemispheres, somewhat age indeterminate, but most like related chronic microvascular ischemic disease. No acute  intracranial hemorrhage. No acute cortical based infarct. No mass lesion or mass effect. No hydrocephalus or visible extra-axial fluid collection. Vascular: No hyperdense vessel. Scattered vascular calcifications noted within the carotid siphons. Skull: Scalp soft tissues within normal limits.  Calvarium intact. Sinuses/Orbits: Globes and orbital soft tissues demonstrate no acute finding. Bilateral proptosis. Paranasal sinuses are largely clear. No mastoid effusion. Other: Enteric and endotracheal tubes in place, partially visualized. IMPRESSION: 1. No acute intracranial abnormality. 2. Age-related cerebral atrophy with chronic small vessel ischemic disease. Electronically Signed   By: Jeannine Boga M.D.   On: 03/10/2021 00:37   CT ANGIO AO+BIFEM W & OR WO CONTRAST  Result Date: 03/26/2021 CLINICAL DATA:  Right lower extremity arterial revascularization now with lower extremity claudication EXAM: CT ANGIOGRAPHY OF ABDOMINAL AORTA WITH ILIOFEMORAL RUNOFF TECHNIQUE: Multidetector CT imaging of the abdomen, pelvis and lower extremities was performed using the standard protocol during bolus administration of intravenous contrast. Multiplanar CT image reconstructions and MIPs were obtained to evaluate the vascular anatomy. CONTRAST:  18mL OMNIPAQUE IOHEXOL 350 MG/ML SOLN COMPARISON:  None. FINDINGS: VASCULAR Aorta: Normal caliber. Moderate atherosclerotic calcification. No aneurysm or dissection. No periaortic inflammatory change. 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 Mild poststenotic dilation. Distally widely patent. Normal anatomic configuration of the celiac axis. Replaced left hepatic artery noted. SMA: Widely patent.  No aneurysm or dissection. Renals: Single renal arteries are noted bilaterally. There is a focal dissection flap within the mid segment of the left renal artery resulting in a roughly 50% stenosis. A focal dissection flap is also identified within the mid left renal  artery again, similarly, resulting in a roughly 50% stenosis of this vessel in this region. No aneurysm. IMA: Widely patent. RIGHT Lower Extremity Inflow: There is a focal dissection flap identified within the right common iliac artery which does not appear flow limiting in this region which terminates within the proximal right internal iliac artery resulting in focal high-grade stenosis in this location. Moderate atherosclerotic calcification. No aneurysm. Scattered plaque throughout the right common and external iliac artery without evidence of hemodynamically significant stenosis. Outflow: There is irregular mixed atherosclerotic plaque seen within the proximal right common femoral artery above the level of endarterectomy resulting in a focal 60-70 % stenosis of this vessel. This is best seen on image # 211/5. Surgical changes of right endarterectomy are identified. The right superficial femoral artery is thrombosed at its origin. The right profundus femoral artery demonstrates a high-grade (greater than 75%) stenosis at its origin. Distally, the profundus femoral artery is patent and supplies multiple muscular collaterals which ultimately reconstitute the superficial femoral artery at the level of the adductor hiatus. Runoff: There is extensive mixed atherosclerotic plaque seen throughout the popliteal artery resulting in multilevel high-grade (greater than 75%) stenoses. The anterior tibial artery is occluded shortly after its takeoff. The posterior tibial artery is patent and forms the plantar arch. The peroneal artery is patent and reconstitutes the dorsalis pedis artery. LEFT Lower Extremity Inflow: The left common iliac artery is widely patent. The left internal iliac artery demonstrates a high-grade stenosis at its origin, but is patent. Extensive focal soft plaque results in a 75% stenosis of the left external iliac artery. Outflow: The left common femoral artery is heavily diseased with a focal 50-75%  stenosis noted superiorly. The superficial femoral artery is focally occluded at its origin. Distally, the vessel is heavily disease with multilevel 50-75% stenoses. The profundus femoral artery is narrowed at its origin by 50% but is patent. Runoff: Extensive mixed plaque within the popliteal artery results in a greater than 75% stenosis of this vessel in the P1 segment and 50-75% stenosis within the P2 segment. There is variant anatomy with a high takeoff of the anterior tibial artery from the P2/3 junction. This vessel is patent and forms the dorsalis pedis artery. The tibioperoneal trunk demonstrates a focal 50% stenosis at its origin. The peroneal artery is heavily disease. The posterior tibial artery continues to form the plantar arch. Veins: No obvious venous abnormality within the limitations of this arterial phase study. Review of the MIP images confirms the above findings. NON-VASCULAR Lower chest: Mild bibasilar atelectasis. Cardiac size is mildly enlarged. Extensive multi-vessel coronary artery calcification. Hepatobiliary: No focal liver abnormality is seen. No gallstones, gallbladder wall thickening, or biliary dilatation. Pancreas: Unremarkable Spleen: Unremarkable Adrenals/Urinary Tract: The adrenal glands are unremarkable. The kidneys are atrophic bilaterally. Several simple cortical cysts are seen within the left kidney. Indeterminate low-attenuation lesion is seen within the interpolar region of the right kidney, not well characterized on this examination. The bladder is decompressed. Stomach/Bowel: The stomach, small bowel, and large bowel are unremarkable. No free intraperitoneal gas or fluid. Lymphatic: No pathologic adenopathy within the abdomen and pelvis. Reproductive: Prostate is unremarkable. Other: There is a focal high attenuation fluid collection within the right groin measuring 5.6 x 6.6 x 9.9 cm compatible with a a hematoma. Musculoskeletal: No acute bone abnormality. 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 Bilateral focal dissection flaps within the a mid renal arteries resulting in roughly 50% stenoses of these vessels. Focal dissection flap within the right common iliac artery which does not appear flow limiting within the common iliac artery, but terminates within the origin of the right internal iliac artery and results in a focal high-grade stenosis of this vessel at its origin. Right common femoral artery stenosis of 60-70% above the level of endarterectomy. Right 6 SFA is thrombosed. Profundus femoral artery demonstrates a a greater than 75% stenosis at its origin but ultimately reconstitutes the right lower extremity runoff. Multilevel high-grade stenosis of the popliteal artery. Posterior tibial artery and peroneal arteries formed 2 vessel runoff to the right foot with patency of the dorsalis pedis and plantar arch. Focal 75% stenosis of the left external iliac artery secondary to soft plaque. Focal 50-75% stenosis of the left common femoral artery. Focal occlusion of the SFA at its origin with multifocal stenosis distally throughout its course. Profundus femoral artery origin stenosis by 50%. Extensive left lower extremity runoff disease with multifocal hemodynamically significant stenosis of the P1 and P2 segments. Variant anatomy with high takeoff of the anterior tibial artery proximal to a 50% stenosis of the tibioperoneal trunk. Two vessel runoff to the left foot patency of the dorsalis pedis and plantar arch. NON-VASCULAR 9.9 cm hematoma within the right groin surrounding the site of endarterectomy. Low-attenuation cortical lesion within the interpolar region of the right kidney, not well characterized on this examination. This could be better assessed with dedicated sonography as an initial step. Electronically Signed   By: Fidela Salisbury MD   On: 03/19/2021 01:30   PERIPHERAL VASCULAR CATHETERIZATION  Result Date: 02/28/2021 Patient name:  Michael Blackwell MRN: [id number] DOB: 08-Aug-1941 Sex: male 02/27/2021 - 02/28/2021 Pre-operative Diagnosis:  Right leg rest pain Post-operative diagnosis:  Same Surgeon:  Annamarie Major Procedure Performed:  1.  Ultrasound-guided access, left femoral artery  2.  Abdominal aortogram  3.  Bilateral lower extremity runoff  4.  Second-order catheterization  5.  Conscious sedation, 47 minutes Indications: This is a 80 year old gentleman who has previously undergone intervention in his right leg.  He presented to the hospital with several day history of worsening pain.  He is here for further evaluation. Procedure:  The patient was identified in the holding area and taken to room 8.  The patient was then placed supine on the table and prepped and draped in the usual sterile fashion.  A time out was called.  Conscious sedation was administered with the use of IV fentanyl and Versed under continuous physician and nurse monitoring.  Heart rate, blood pressure, and oxygen saturation were continuously monitored.  Total sedation time was 52minutes.  Ultrasound was used to evaluate the left common femoral artery.  It was patent .  A digital ultrasound image was acquired.  A micropuncture needle was used to access the left common femoral artery under ultrasound guidance.  An 018 wire was advanced without resistance and a micropuncture sheath was placed.  The 018 wire was removed and a benson wire was placed.  The micropuncture sheath was exchanged for a 5 french sheath.  An omniflush catheter was advanced over the wire to the level of L-1.  An abdominal angiogram was obtained.  Next, using the omniflush catheter and a benson wire, the aortic bifurcation was crossed and the catheter was placed into theright external iliac artery and right runoff was obtained.  left runoff was performed via retrograde sheath injections. Findings:  Aortogram: No significant renal artery stenosis was identified.  The infrarenal abdominal aorta is heavily  calcified but without significant stenosis.  Bilateral common and external iliac arteries are heavily calcified but patent without hemodynamically significant stenosis.  Right Lower Extremity: Approximate 50 to 60% stenosis within the right common femoral artery.  There is a stent extending into the common femoral artery.  The stent occludes at the origin of the superficial femoral artery.  There is reconstitution of the diseased above-knee popliteal artery.  The below-knee popliteal artery is patent with two-vessel runoff via the posterior tibial and peroneal artery.  There is ostial stenosis at the origin of the profundofemoral artery  Left Lower Extremity: Severely diseased common femoral artery and proximal profundofemoral artery.  The superficial femoral artery is occluded with reconstitution at the knee.  There is single-vessel runoff via the peroneal artery Intervention: None Impression:  #1  Occluded right superficial femoral artery with ostial profunda stenosis.  There is reconstitution of a diseased above-knee popliteal artery.  The below-knee popliteal artery is without stenosis.  There is two-vessel runoff  #2  Due to the stent within the common femoral artery and profunda stenosis, surgical revascularization would be preferred.  #3 severely diseased left common femoral artery with occluded superficial femoral artery and single-vessel runoff via the peroneal artery  V. Annamarie Major, M.D., [facility] [place] Office: [phone] Pager:  [phone]  DG CHEST PORT 1 VIEW  Result Date: March 19, 2021 CLINICAL DATA:  80 year old male with shortness of breath and chest pain EXAM: PORTABLE CHEST 1 VIEW COMPARISON:  Chest x-ray 03/09/2021 FINDINGS: The tip of the endotracheal tube is 3.2 cm above the carina. Left IJ approach temporary hemodialysis catheter remains in stable position with the tip in the mid SVC. Right  IJ central venous catheter remains in good position with the  tip at the superior cavoatrial junction. Stable cardiac and mediastinal contours. Atherosclerotic calcification again noted in the transverse aorta. A feeding tube is present. The tip lies off the field of view, presumably within the stomach or proximal small bowel. The previously noted gastric tube is been removed. The lungs are clear. No pleural effusion or pneumothorax. No acute osseous abnormality. IMPRESSION: 1. Satisfactory support apparatus as above. 2. No acute cardiopulmonary process. Electronically Signed   By: Jacqulynn Cadet M.D.   On: March 24, 2021 10:10   DG CHEST PORT 1 VIEW  Result Date: 03/09/2021 CLINICAL DATA:  Respiratory failure, central venous catheter placement EXAM: PORTABLE CHEST 1 VIEW COMPARISON:  03/09/2021 FINDINGS: Endotracheal tube is seen 4.2 cm above the carina. Left internal jugular hemodialysis catheter is unchanged with its tip within superior vena cava. Nasogastric tube is unchanged with its tip within the expected proximal body of the stomach. Esophageal Doppler probe is seen with its tip within the expected left para form sinus. Right internal jugular hemodialysis catheter has been placed with its tip within the superior vena cava. Lungs are clear. No pneumothorax or pleural effusion. Cardiac size within normal limits. Pulmonary vascularity is normal. IMPRESSION: Right internal jugular central venous catheter tip within the superior vena cava. No pneumothorax. Esophageal Doppler probe tip within the left piriform sinus. Otherwise stable support lines and tubes. Lungs are clear. Electronically Signed   By: Fidela Salisbury MD   On: 03/09/2021 21:31   Portable Chest x-ray  Result Date: 03/09/2021 CLINICAL DATA:  Tube placement EXAM: PORTABLE CHEST 1 VIEW COMPARISON:  Portable exam 1738 hours compared to 03/09/2021 FINDINGS: Tip of endotracheal tube projects 3.3 cm above carina. Nasogastric tube extends into stomach. Dual lumen LEFT jugular central venous catheter with tip  projecting over SVC. Normal heart size, mediastinal contours, and pulmonary vascularity. External pacing leads. Lungs clear. No pulmonary infiltrate, pleural effusion, or pneumothorax. IMPRESSION: No acute abnormalities. Electronically Signed   By: Lavonia Dana M.D.   On: 03/09/2021 18:26   DG CHEST PORT 1 VIEW  Result Date: 03/09/2021 CLINICAL DATA:  Feeding tube placed EXAM: PORTABLE CHEST 1 VIEW COMPARISON:  03/18/2021 FINDINGS: Nasogastric tube is seen with its tip overlying the gastric fundus. Left internal jugular hemodialysis catheter tip noted at the superior cavoatrial junction. Endotracheal tube noted 3.4 cm above the carina. Lungs are clear. No pneumothorax or pleural effusion. Cardiac size within normal limits. Pulmonary vascularity is normal. IMPRESSION: Support lines and tubes in appropriate position. Lungs are clear. Electronically Signed   By: Fidela Salisbury MD   On: 03/09/2021 01:37   DG CHEST PORT 1 VIEW  Result Date: 03/14/2021 CLINICAL DATA:  Central line placement. EXAM: PORTABLE CHEST 1 VIEW COMPARISON:  Feb 27, 2021. FINDINGS: The heart size and mediastinal contours are within normal limits. Both lungs are clear. Endotracheal tube is in good position. Interval placement of left internal jugular catheter with distal tip in expected position of cavoatrial junction. No pneumothorax or pleural effusion is noted. The visualized skeletal structures are unremarkable. IMPRESSION: Endotracheal tube in grossly good position. Interval placement of left internal jugular catheter with distal tip in expected position of cavoatrial junction. Electronically Signed   By: Marijo Conception M.D.   On: 03/29/2021 20:06   DG Chest Port 1 View  Result Date: 02/27/2021 CLINICAL DATA:  Possible sepsis EXAM: PORTABLE CHEST 1 VIEW COMPARISON:  11/16/2019 FINDINGS: Cardiac shadow is stable. Lungs are clear.  No bony abnormality is noted. IMPRESSION: No acute abnormality noted. Electronically Signed   By: Inez Catalina M.D.   On: 02/27/2021 19:20   DG Abd Portable 1V  Result Date: 03/10/2021 CLINICAL DATA:  NG feeding tube placement EXAM: PORTABLE ABDOMEN - 1 VIEW COMPARISON:  Radiograph 03/09/2021 FINDINGS: Feeding tube tip overlies the stomach. Unremarkable bowel gas pattern in the upper abdomen. Visualized lung bases are clear. There is a partially visualized central venous catheter overlying the distal SVC. No acute osseous abnormality. IMPRESSION: Feeding tube tip overlies the stomach. Electronically Signed   By: Maurine Simmering   On: 03/10/2021 12:54   VAS [place] ABI WITH/WO TBI  Result Date: 03/09/2021  LOWER EXTREMITY DOPPLER STUDY Patient Name:  Michael Blackwell  Date of Exam:   03/06/2021 Medical Rec #: [id number]        Accession #:    [id number] Date of Birth: Oct 11, 1940        Patient Gender: M Patient Age:   79Y Exam Location:  [facility] Procedure:      VAS [place] ABI WITH/WO TBI Referring Phys: [id number] [place] --------------------------------------------------------------------------------  Indications: Peripheral artery disease.  Vascular Interventions: Right femoral endarterectomy 03/01/21                         Stenting of right common femoral artery and superficial                         femoral artery 07/11/2020. Comparison Study: 08/05/20 Performing Technologist: June Leap RDMS, RVT  Examination Guidelines: A complete evaluation includes at minimum, Doppler waveform signals and systolic blood pressure reading at the level of bilateral brachial, anterior tibial, and posterior tibial arteries, when vessel segments are accessible. Bilateral testing is considered an integral part of a complete examination. Photoelectric Plethysmograph (PPG) waveforms and toe systolic pressure readings are included as required and additional duplex testing as needed. Limited examinations for reoccurring indications may be performed as noted.  ABI Findings:  +---------+------------------+-----+--------+--------+ Right    Rt Pressure (mmHg)IndexWaveformComment  +---------+------------------+-----+--------+--------+ Brachial                                AVF      +---------+------------------+-----+--------+--------+ ATA                             absent           +---------+------------------+-----+--------+--------+ PTA                             absent           +---------+------------------+-----+--------+--------+ Great Toe                       Absent           +---------+------------------+-----+--------+--------+ +---------+------------------+-----+--------+-------+ Left     Lt Pressure (mmHg)IndexWaveformComment +---------+------------------+-----+--------+-------+ Brachial 92                                     +---------+------------------+-----+--------+-------+ ATA  absent          +---------+------------------+-----+--------+-------+ PTA                             absent          +---------+------------------+-----+--------+-------+ Great Toe                       Absent          +---------+------------------+-----+--------+-------+  Summary: Right: No audible waveform or PPG for pressures to be obtained. No discernable flow. Left: No audible waveform or PPG for pressures to be obtained. No discernable flow.  *See table(s) above for measurements and observations.  Electronically signed by Jamelle Haring on 03/16/2021 at 4:24:54 PM.    Final    VAS [place] LOWER EXTREMITY SAPHENOUS VEIN MAPPING  Result Date: 02/28/2021 LOWER EXTREMITY VEIN [place] Patient Name:  Michael Blackwell  Date of Exam:   02/28/2021 Medical Rec #: [id number]        Accession #:    [id number] Date of Birth: 09-Oct-1940        Patient Gender: M Patient Age:   79Y Exam Location:  [facility] Procedure:      VAS [place] LOWER EXTREMITY SAPHENOUS VEIN MAPPING Referring Phys: [id number] Serafina Mitchell  --------------------------------------------------------------------------------  Indications: Pre-op  Comparison Study: No previous exams Performing Technologist: Rogelia Rohrer  Examination Guidelines: A complete evaluation includes B-mode imaging, spectral Doppler, color Doppler, and power Doppler as needed of all accessible portions of each vessel. Bilateral testing is considered an integral part of a complete examination. Limited examinations for reoccurring indications may be performed as noted. +--------------+-----------+-------------------+--------------+----------------+  RT Diameter  RT Findings        GSV         LT Diameter    LT Findings         (cm)                                        (cm)                      +--------------+-----------+-------------------+--------------+----------------+      0.63                  Saphenofemoral                  not visualized                                Junction                      and Bandage                                                                  present      +--------------+-----------+-------------------+--------------+----------------+      0.48      branching   Proximal thigh        0.45                      +--------------+-----------+-------------------+--------------+----------------+  0.38                     Mid thigh          0.32                      +--------------+-----------+-------------------+--------------+----------------+      0.28      branching    Distal thigh         0.24                      +--------------+-----------+-------------------+--------------+----------------+      0.32                       Knee             0.29                      +--------------+-----------+-------------------+--------------+----------------+      0.25      branching      Prox calf          0.25        branching      +--------------+-----------+-------------------+--------------+----------------+      0.32                     Mid calf           0.18        branching     +--------------+-----------+-------------------+--------------+----------------+      0.32                    Distal calf         0.17                      +--------------+-----------+-------------------+--------------+----------------+      0.31                       Ankle            0.19                      +--------------+-----------+-------------------+--------------+----------------+ Diagnosing physician: Deitra Mayo MD Electronically signed by Deitra Mayo MD on 02/28/2021 at 6:43:20 PM.    Final    HYBRID OR IMAGING ([place])  Result Date: 03/16/2021 There is no interpretation for this exam.  This order is for images obtained during a surgical procedure.  Please See "Surgeries" Tab for more information regarding the procedure.   CT Angio Abd/Pel w/ and/or w/o  Result Date: 03/10/2021 CLINICAL DATA:  Mesenteric ischemia, acute cardiac arrest, acute anemia right groin swelling. EXAM: CTA ABDOMEN AND PELVIS WITHOUT AND WITH CONTRAST TECHNIQUE: Multidetector CT imaging of the abdomen and pelvis was performed using the standard protocol during bolus administration of intravenous contrast. Multiplanar reconstructed images and MIPs were obtained and reviewed to evaluate the vascular anatomy. CONTRAST:  164mL OMNIPAQUE IOHEXOL 350 MG/ML SOLN COMPARISON:  None. FINDINGS: VASCULAR Aorta: Normal caliber. No aneurysm or dissection. No hemodynamically significant stenosis. Moderate atherosclerotic calcification. No periaortic inflammatory change. Celiac: The celiac axis demonstrates a roughly 90% stenosis at its origin. Distally, the vessels are extremely diminutive likely related to systemic hypotension or vasopressor administration. SMA: Widely patent. Mild atherosclerotic plaque within the mid segment and distally does not  result in hemodynamically significant stenosis. No aneurysm or dissection. Renals: Single renal arteries bilaterally are  widely patent at their origins. Normal vascular morphology. No aneurysm. IMA: High-grade (greater than 75%) stenosis of this vessel at its origin. The vessel appears occluded beyond the a takeoff of the marginal artery, however, this may be simply related to severe invasive constriction and poor filling. Inflow: There is a a short segment focal dissection within the mid and distal right common iliac artery extending to the a origin of the right internal iliac artery. This does not appear flow limiting. There is a roughly 50% stenosis of the proximal right external iliac artery. Extensive atherosclerotic plaque results in a long segment roughly 50% stenosis of the right external iliac artery. Extensive plaque within the left common and external iliac artery is present with a 50-75% stenosis of the left external iliac artery secondary to atheromatous plaque. Internal iliac arteries are occluded at their origin on the right and nearly occluded on their origin on the left but are distally patent. Proximal Outflow: There is a greater than 75% stenosis of the left common femoral artery secondary to calcified atherosclerotic plaque. The left SFA is occluded. The left profundus femoral artery is heavily diseased. On the right, surgical changes of a probable common femoral endarterectomy have been performed. There is a a moderate hematoma within the right groin surrounding the right common femoral artery measuring at least 5.0 x 5.2 x 8.5 cm in greatest dimension. There is no active extravasation identified. The profundus femoral artery is disease but is patent. A a SFA bypass graft is patent. The native superficial femoral artery is occluded. Veins: Unremarkable. Review of the MIP images confirms the above findings. NON-VASCULAR Lower chest: The visualized lung bases are clear. Extensive multi-vessel  coronary artery calcification. Degenerative calcification is seen of the aortic valve leaflets. Cardiac size is within normal limits. Hepatobiliary: There are multiple peripheral areas of non enhancement in keeping with with probable peripheral liver infarcts predominantly within the left hepatic lobe. Small infarcts also noted within the inferior right hepatic lobe. Vicarious excretion of contrast results in high attenuation material within the gallbladder lumen. The gallbladder is otherwise unremarkable. Pancreas: There is moderate peripancreatic edema surrounding the head and proximal body of the pancreas extending into the right anterior pararenal space which may reflect changes of acute interstitial pancreatitis. Normal enhancement of the pancreatic parenchyma. The pancreatic duct is not dilated. No parenchymal calcifications are seen. Spleen: Unremarkable Adrenals/Urinary Tract: Heterogeneous enhancement of the kidneys bilaterally may relate to contrast induced nephropathy. The kidneys are normal in size and position. No hydronephrosis. No intrarenal or ureteral calculi. The bladder is decompressed and is unremarkable. The adrenal glands are unremarkable. Stomach/Bowel: Nasogastric tube is seen within the proximal body of the stomach. The stomach, small bowel, and large bowel are otherwise unremarkable. Moderate stool within the rectal vault. Lymphatic: No pathologic adenopathy within the abdomen and pelvis. Reproductive: Brachytherapy seeds are seen within the prostate gland. The prostate gland is of normal size. Seminal vesicles unremarkable. Other: No abdominal wall hernia. Surgical skin staples and subcutaneous gas noted within the right groin related to recent surgery. Musculoskeletal: Multiple anterior rib fractures are seen within the visualized thorax likely result of reported cardiopulmonary resuscitation. No acute bone abnormality within the abdomen and pelvis. Osseous structures are age-appropriate.  IMPRESSION: VASCULAR Surgical changes of probable right common femoral endarterectomy and femoral bypass grafting. Moderate hematoma within the right groin surrounding the common femoral artery measuring at least 8.5 cm in greatest dimension. No active extravasation identified on this examination, however. Peripheral vascular disease with  extensive mixed atherosclerotic plaque resulting in hemodynamically significant stenoses of the external iliac arteries bilaterally and left common femoral artery. Occlusion of the left superficial femoral artery and disease of the left profundus femoral artery is partially visualized. Marked diminution of the peripheral vasculature of the celiac axis which may relate to hypotension or vasopressor administration. Superimposed 90% stenosis of the origin of the celiac axis. Additional greater than 75% stenosis of the inferior mesenteric artery at its origin. The findings are anatomically compatible with the given history of mesenteric ischemia. While diseased in its mid and distal segments, the superior mesenteric artery is patent without evidence of hemodynamically significant stenosis. 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 Peripancreatic edema possibly reflecting changes of acute interstitial/edematous pancreatitis. No evidence of pancreatic necrosis. Correlation with serum amylase and lipase levels would be helpful in confirming this finding. Vicarious excretion of contrast within the gallbladder lumen. Heterogeneous enhancement of the kidneys bilaterally may relate to changes of contrast induced nephropathy. Renal infarction is possible, but is considered less likely given the irregularity of enhancement. Aortic Atherosclerosis (ICD10-I70.0). These results will be called to the ordering clinician or representative by the Radiologist Assistant, and communication documented in the PACS or [employer]. Electronically  Signed   By: Fidela Salisbury MD   On: 03/10/2021 02:33    Microbiology Recent Results (from the past 240 hour(s))  SARS CORONAVIRUS 2 (TAT 6-24 HRS) Nasopharyngeal Nasopharyngeal Swab     Status: None   Collection Time: 03/20/2021 10:36 PM   Specimen: Nasopharyngeal Swab  Result Value Ref Range Status   SARS Coronavirus 2 NEGATIVE NEGATIVE Final    Comment: (NOTE) SARS-CoV-2 target nucleic acids are NOT DETECTED.  The SARS-CoV-2 RNA is generally detectable in upper and lower respiratory specimens during the acute phase of infection. Negative results do not preclude SARS-CoV-2 infection, do not rule out co-infections with other pathogens, and should not be used as the sole basis for treatment or other patient management decisions. Negative results must be combined with clinical observations, patient history, and epidemiological information. The expected result is Negative.  Fact Sheet for Patients: SugarRoll.be  Fact Sheet for Healthcare Providers: https://www.woods-mathews.com/  This test is not yet approved or cleared by the [place] FDA and  has been authorized for detection and/or diagnosis of SARS-CoV-2 by FDA under an Emergency Use Authorization (EUA). This EUA will remain  in effect (meaning this test can be used) for the duration of the COVID-19 declaration under Se ction 564(b)(1) of the Act, 21 U.S.C. section 360bbb-3(b)(1), unless the authorization is terminated or revoked sooner.  Performed at [facility], [place] [address] [place], [place] [address]   Culture, blood (routine x 2)     Status: None (Preliminary result)   Collection Time: 03/10/21  4:20 AM   Specimen: BLOOD LEFT ARM  Result Value Ref Range Status   Specimen Description BLOOD LEFT ARM  Final   Special Requests   Final    BOTTLES DRAWN AEROBIC ONLY Blood Culture results may not be optimal due to an inadequate volume of blood received in culture bottles    Culture   Final    NO GROWTH 4 DAYS Performed at [facility], [place] [address], [place], [place] [address]    Report Status PENDING  Incomplete  Culture, blood (routine x 2)     Status: None (Preliminary result)   Collection Time: 03/10/21  4:20 AM   Specimen: BLOOD LEFT HAND  Result Value Ref Range Status   Specimen Description BLOOD LEFT HAND  Final   Special Requests   Final    BOTTLES DRAWN AEROBIC ONLY Blood Culture adequate volume   Culture   Final    NO GROWTH 4 DAYS Performed at [facility], [address] [place], [place] [address]    Report Status PENDING  Incomplete    Lab Basic Metabolic Panel: Recent Labs  Lab 03/09/21 1542 03/09/21 1704 03/09/21 1710 03/09/21 2006 03/09/21 2013 03/10/21 0127 03/10/21 0341 03/10/21 1526 03/13/2021 0301 2021/03/13 0505 2021-03-13 0735 03/13/21 1600  NA 138 142   < > 147* 144  144   < > 139 138 137 136 136 137  K 5.5* 6.4*   < > 5.1 5.0  4.9   < > 4.7 4.2 4.3 4.3 3.8 3.5  CL 101 101  --  104 104  --  98 99 100  --   --  104  CO2 14* 15*  --  15*  --   --  21* 21* 21*  --   --  19*  GLUCOSE 108* 84  --  47* 47*  --  118* 90 109*  --   --  146*  BUN 31* 29*  --  29* 30*  --  36* 28* 19  --   --  17  CREATININE 5.08* 4.89*  --  4.72* 4.50*  --  5.18* 3.53* 2.44*  --   --  1.96*  CALCIUM 8.5* 8.0*  --  7.5*  --   --  7.5* 8.2* 8.6*  --   --  8.2*  MG  --  2.6*  --  2.5*  --   --  2.2 2.5* 2.6*  --   --   --   PHOS 6.5*  --   --   --   --   --  6.1* 4.9* 4.4  --   --  4.6   < > = values in this interval not displayed.   Liver Function Tests: Recent Labs  Lab 03/16/2021 2112 03/29/2021 2004 03/09/21 0347 03/09/21 1542 03/09/21 1704 03/10/21 0341 Mar 13, 2021 0301 2021-03-13 1600  AST 17 11*  --   --  1,481* 8,443* 9,053*  --   ALT 5 6  --   --  746* 1,933* 1,164*  --   ALKPHOS 54 34*  --   --  50 53 91  --   BILITOT 0.4 1.0  --   --  0.7 1.7* 1.0  --   PROT 6.5 4.9*  --   --  5.1* 4.7* 5.2*  --   ALBUMIN 2.8* 2.6*    < > 2.8* 2.5* 2.6*  2.6* 2.7* 2.5*   < > = values in this interval not displayed.   No results for input(s): LIPASE, AMYLASE in the last 168 hours. No results for input(s): AMMONIA in the last 168 hours. CBC: Recent Labs  Lab 03/09/21 1704 03/09/21 1710 03/09/21 2006 03/09/21 2013 03/10/21 0111 03/10/21 0127 03/10/21 0341 03/10/21 0649 03/10/21 1526 03/13/21 0301 03-13-2021 0505 03/13/2021 0735 03-13-2021 1700  WBC 18.5*  --  15.9*  --  11.4*  --  10.0  --  11.2* 13.9*  --   --  15.5*  NEUTROABS 14.8*  --  12.4*  --   --   --   --   --  10.1*  --   --   --   --   HGB 7.6*   < >  5.9*   < > 10.3*   < > 10.2*  --  11.2* 11.3* 10.9* 11.9* 11.5*  HCT 22.2*   < > 18.7*   < > 29.7*   < > 28.4*  --  31.0* 32.8* 32.0* 35.0* 34.4*  MCV 88.8  --  95.9  --  87.6  --  86.3  --  85.9 89.9  --   --  91.2  PLT 210  202  --  171  --  92*  --  88* 80* 102* 120*  --   --  98*   < > = values in this interval not displayed.   Cardiac Enzymes: No results for input(s): CKTOTAL, CKMB, CKMBINDEX, TROPONINI in the last 168 hours. Sepsis Labs: Recent Labs  Lab 03/09/21 1703 03/09/21 1704 03/10/21 0341 03/10/21 1526 04/08/2021 0301 April 08, 2021 1550 Apr 08, 2021 1700  WBC  --    < > 10.0 11.2* 13.9*  --  15.5*  LATICACIDVEN >11*  --  5.7*  --  6.4* 6.0*  --    < > = values in this interval not displayed.    Procedures/Operations  Right redo iliofemoral endarterectomy and right femoral-popliteal bypass grafting with ipsilateral, reversed, translocated greater saphenous vein 03/03/2021   Cherre Robins 03/14/2021, 6:30 PM

## 2021-03-31 NOTE — Progress Notes (Signed)
I responded to a page from the nurse to provide spiritual support for the patient's family due to the patient's death. I visited the patient's room where his wife, sons, and daughter-in-law were present. I shared words of comfort, read scriptures, and led in prayer. I provided spiritual support through pastoral presence.    2021-03-27 2120  Clinical Encounter Type  Visited With Patient and family together  Visit Type Spiritual support;Death  Referral From Nurse  Consult/Referral To Chaplain  Spiritual Encounters  Spiritual Needs Prayer;Emotional;Grief support    Chaplain Dr Redgie Grayer

## 2021-03-31 NOTE — Progress Notes (Addendum)
[facility] NEPHROLOGY PROGRESS NOTE  Summary: Pt is a 80 y.o. yo male  ESRD on HD, PAD, HTN, HLD, DM, OSA on CPAP, h/o prostate cancer presenting with right foot pain for many weeks, underwent vascular surgery, seen as a consultation to manage ESRD.  OP HD: [facility] MWF.   4h  77kg  2/2 bath 400/500  Hep 2300 RUE AVG Mircera 50 mcg IV q 4 weeks Calcitriol 0.85mg PO q HD Velphoro 1 tab PO TID with meals  Assessment/ Plan PAD:s/p right fem pop bypass 03/25/2021 and exploration of right groin exploration with evacuation of seroma.  Per surgical team. ESRD MWF schedule.  Started CRRT on 6/8 because of hypotension. Volume: no sig edema on exam, at dry wt and CXR clear. Will dc UF and keep +50 cc/hr w/ CRRT to make up for insensible losses.  Hypotension/shock: on low dose levo gtt only.  Anemia: Hemoglobin at goal.  Monitor lab. Secondary hyperparathyroidism: Elevated phosphorus level.  Resume Velphoro eating. Liver infarction/transaminitis: Avoid citrate.  Further management per primary team. Thrombocytopenia: dc'd CRRT heparin   RKelly Splinter MD 606/20/2022 9:30 AM      Subjective: seen in room, met w/ wife and son and explained CRRT.    Physical Exam: General: Critically ill looking male intubated, sedated Heart:RRR, s1s2 nl Lungs: Coarse breath sound bilateral Abdomen:soft, Non-tender, non-distended Extremities: no sig LE or UE edema Dialysis Access: Right upper extremity AV graft has thrill and bruit. Temporary HD catheter present for CRRT.   Objective Vital signs in last 24 hours: Vitals:   006/20/[id number] 0June 20, [id number] 0Jun 20, [id number] 0Jun 20, [id number]  BP: (!) 106/53 (!) 97/55 (!) 97/59 (!) 100/59  Pulse: 76  74 76  Resp: (!) 24 (!) 24 (!) 26 (!) 26  Temp: (!) 94.46 F (34.7 C) (!) 94.28 F (34.6 C) (!) 94.28 F (34.6 C) (!) 94.1 F (34.5 C)  TempSrc:      SpO2: 100% 100% 100% 98%  Weight:      Height:       Weight change: 0.1 kg  Intake/Output  Summary (Last 24 hours) at [id number] Last data filed at [id number] Gross per 24 hour  Intake 1784.08 ml  Output 2213 ml  Net -428.92 ml        Labs: Basic Metabolic Panel: Recent Labs  Lab 03/10/21 0341 03/10/21 1526 0June 20, [id number]  NA 139 138 137 136 136  K 4.7 4.2 4.3 4.3 3.8  CL 98 99 100  --   --   CO2 21* 21* 21*  --   --   GLUCOSE 118* 90 109*  --   --   BUN 36* 28* 19  --   --   CREATININE 5.18* 3.53* 2.44*  --   --   CALCIUM 7.5* 8.2* 8.6*  --   --   PHOS 6.1* 4.9* 4.4  --   --     Liver Function Tests: Recent Labs  Lab 03/09/21 1704 03/10/21 0341 006/20/220301  AST 1,481* 8,443* 9,053*  ALT 746* 1,933* 1,164*  ALKPHOS 50 53 91  BILITOT 0.7 1.7* 1.0  PROT 5.1* 4.7* 5.2*  ALBUMIN 2.5* 2.6*  2.6* 2.7*    No results for input(s): LIPASE, AMYLASE in the last 168 hours. No results for input(s): AMMONIA in the last 168 hours. CBC: Recent Labs  Lab 03/09/21 1704 03/09/21 1710 03/09/21 2006 03/09/21 2013 03/10/21 0111 03/10/21 0127 03/10/21 0341 03/10/21 0295706/10/22 1526 0Jun 20, 20220301  03-17-21 0505 2021-03-17 0735  WBC 18.5*  --  15.9*  --  11.4*  --  10.0  --  11.2* 13.9*  --   --   NEUTROABS 14.8*  --  12.4*  --   --   --   --   --  10.1*  --   --   --   HGB 7.6*   < > 5.9*   < > 10.3*   < > 10.2*  --  11.2* 11.3* 10.9* 11.9*  HCT 22.2*   < > 18.7*   < > 29.7*   < > 28.4*  --  31.0* 32.8* 32.0* 35.0*  MCV 88.8  --  95.9  --  87.6  --  86.3  --  85.9 89.9  --   --   PLT 210  202  --  171  --  92*  --  88* 80* 102* 120*  --   --    < > = values in this interval not displayed.    Cardiac Enzymes: No results for input(s): CKTOTAL, CKMB, CKMBINDEX, TROPONINI in the last 168 hours. CBG: Recent Labs  Lab 03/10/21 1941 03/10/21 2316 03-17-2021 0006 2021-03-17 0350 03/17/2021 0733  GLUCAP 71 65* 116* 96 132*     Iron Studies: No results for input(s): IRON, TIBC, TRANSFERRIN, FERRITIN in the last 72  hours.  Medications: Infusions:  sodium chloride 10 mL/hr at 2021-03-17 0800   sodium chloride     sodium chloride Stopped (03/09/21 2041)   ceFEPime (MAXIPIME) IV 2 g (March 17, 2021 0904)   dextrose 30 mL/hr at 17-Mar-2021 0800   epinephrine Stopped (03/10/21 0135)   feeding supplement (VITAL 1.5 CAL) 40 mL/hr at 03-17-21 0800   magnesium sulfate bolus IVPB     norepinephrine (LEVOPHED) Adult infusion 5 mcg/min (03/17/2021 0800)   phenylephrine (NEO-SYNEPHRINE) Adult infusion 300 mcg/min (03/09/21 2000)   prismasol BGK 2/2.5 dialysis solution 1,800 mL/hr at 03/17/2021 0919   prismasol BGK 2/2.5 replacement solution 500 mL/hr at 03/10/21 2234   prismasol BGK 2/2.5 replacement solution 300 mL/hr at 03/10/21 1653   propofol (DIPRIVAN) infusion 15 mcg/kg/min (2021/03/17 0800)   vancomycin 200 mL/hr at March 17, 2021 0800    Scheduled Medications:  aspirin  81 mg Per Tube Daily   chlorhexidine gluconate (MEDLINE KIT)  15 mL Mouth Rinse BID   Chlorhexidine Gluconate Cloth  6 each Topical Daily   clopidogrel  75 mg Per Tube Daily   docusate  100 mg Per Tube BID   feeding supplement (PROSource TF)  45 mL Per Tube TID   insulin aspart  1-3 Units Subcutaneous Q4H   mouth rinse  15 mL Mouth Rinse 10 times per day   pantoprazole (PROTONIX) IV  40 mg Intravenous QHS   polyethylene glycol  17 g Per Tube Daily   rosuvastatin  10 mg Per Tube Daily   sodium chloride flush  3 mL Intravenous Q12H    have reviewed scheduled and prn medications.

## 2021-03-31 NOTE — Significant Event (Signed)
Arrived to bedside after being alerted to [place]   At time of arrival, time of death had been declared despite best efforts at resuscitation (see code team documentation).  The patient's family has arrived to bedside and I shared the unfortunate news of Mr. Rounds's passing. Emotional support provided. All questions answered.  Chaplain is coming to visit with the family during this time of grief.   Primary team paged prior to family arrival.    Eliseo Gum MSN, AGACNP-BC [place] for pager details  01-Apr-2021, 8:58 PM

## 2021-03-31 NOTE — Progress Notes (Signed)
eLink Physician-Brief Progress Note Patient Name: [facility] DOB: [birth year] MRN: [id number]   Date of Service  2021/03/23  HPI/Events of Note  Patient with recurrent hypoglycemia.  eICU Interventions  D 10 %  gtt ordered.        Kerry Kass Carlota Philley March 23, 2021, 12:34 AM

## 2021-03-31 NOTE — Code Documentation (Signed)
  Patient Name: [facility]   MRN: [id number]   Date of Birth/ Sex: November 22, 1940 , male      Admission Date: 03/20/2021  Attending Provider: Cherre Robins, MD  Primary Diagnosis: Critical lower limb ischemia [facility]) [I70.229]   Indication: Pt was in his usual state of health until this PM, when he was noted to be unresponsive. Code blue was subsequently called. At the time of arrival on scene, ACLS protocol was underway.   Technical Description:  - CPR performance duration:  20 minutes  - Was defibrillation or cardioversion used? Yes   - Was external pacer placed? No  - Was patient intubated pre/post CPR? No   Medications Administered: Y = Yes; Blank = No Amiodarone  Y  Atropine    Calcium  Y  Epinephrine  Y  Lidocaine    Magnesium    Norepinephrine  Y  Phenylephrine    Sodium bicarbonate  Y  Vasopressin    Other    Post CPR evaluation:  - Final Status - Was patient successfully resuscitated ? No   Miscellaneous Information:  - Time of death:  8:15 PM  - Primary team notified?  Yes  - Family Notified? Yes     Cato Mulligan, MD   2021-03-23, 8:15 PM

## 2021-03-31 NOTE — Progress Notes (Addendum)
IV Team response to code: Bedside RN reports no blood return from central line. Code team actively using central line for medications. Will address blood return once stabilized. Pigtail available on HD cath as well.

## 2021-03-31 NOTE — Progress Notes (Signed)
NAME:  Michael Blackwell, MRN:  [id number], DOB:  1941/05/23, LOS: 4 ADMISSION DATE:  03/14/2021, CONSULTATION DATE:  03/07/2021 REFERRING MD:  Stanford Breed CHIEF COMPLAINT:  Vent Management   History of Present Illness:  80 year old male with PMHx significant for HTN, HLD, T2DM, OSA (on CPAP), ESRD (on HD MWF via RUE AVG), prostate CA and PAD who was admitted for right redo groin exploration with VVS 6/8.  Patient recently admitted 5/30 - 6/2 for R foot pain.  Aortogram and revascularization of the right leg completed 6/1 demonstrating extensive R iliofemoral EA and profundoplasty). Tolerated this well and was discharged the following day with outpatient follow-up with VVS. Presented 6/7 for follow-up due to ongoing foot pain, edema and discoloration of the R great toe and sent to [facility] as a direct admission. CTA with runoff completed 6/8 showed extensive disease.  Patient was subsequently taken back to the OR 6/8 for a R redo groin exploration with evacuation of seroma, R redo iliofemoral endarterectomy, R GSV harvest, R femoral to below-knee popliteal artery bypass with ipsilateral, translocated, non-reversed GSV and RLE angiography. Intraoperative course notable for 2L EBL (3L total transfusion). Patient remained hemodynamically unstable and vasopressors were initiated; he remained intubated  and was transferred to ICU for further monitoring.  PCCM was asked to see in consultation for assistance with medical management.  Pertinent Medical History:  HTN, HLD, T2DM, OSA (on CPAP), ESRD (on HD MWF via RUE AVG), prostate CA and PAD  Significant Hospital Events: Including procedures, antibiotic start and stop dates in addition to other pertinent events   6/8  - Admit for redo R groin exploration/seroma evacuation, R redo iliofemoral EA, R greater saphenous vein harvest, R fem-pop, 2L EBL, remained intubated post-procedure. 6/9 - POD#1. Intubated, decreasing pressor needs (off as of 1030AM). Off sedation,  following commands intermittently. Tolerating CRRT. 6/11 - continue support, hypothermic, bear hugger  Interim History / Subjective:   Patient remains critically ill intubated on mechanical life support CVVHD.  Remains hypothermic bear hugger in place.  Objective:  Blood pressure 118/63, pulse 72, temperature (!) 94.46 F (34.7 C), temperature source Esophageal, resp. rate (!) 24, height 5\' 10"  (1.778 m), weight 77.7 kg, SpO2 100 %.    Vent Mode: PRVC FiO2 (%):  [30 %-50 %] 30 % Set Rate:  [24 bmp-26 bmp] 24 bmp Vt Set:  [580 mL] 580 mL PEEP:  [8 cmH20] 8 cmH20 Pressure Support:  [8 cmH20] 8 cmH20 Plateau Pressure:  [19 cmH20-25 cmH20] 25 cmH20   Intake/Output Summary (Last 24 hours) at 2021-03-12 4268 Last data filed at 03/12/2021 0800 Gross per 24 hour  Intake 1754.08 ml  Output 2263 ml  Net -508.92 ml   Filed Weights   03/09/21 0445 03/10/21 0419 2021-03-12 0442  Weight: 78.8 kg 77.6 kg 77.7 kg   Physical Examination: General: Elderly male intubated on mechanical life support critically ill HEENT: Endotracheal tube in place Neuro: Alert oriented following commands cough gag CV: Regular rate rhythm S1-S2 PULM: Bilateral mechanically ventilated breath sounds GI: Soft nontender mildly distended Extremities: Right great toe with dry gangrene Skin: Bair hugger  Assessment & Plan:   PAD S/p multiple surgeries (most recent 6/8, 3 Days Post-Op from redo R groin exploration with evacuation of seroma, R redo iliofemoral endarterectomy, R GSV harvest, R femoral to below-knee popliteal artery bypass with ipsilateral, translocated, non-reversed GSV, RLE angiography). Plan: Postop management for vascular surgery Continue aspirin plus Plavix  Acute hypoxemic respiratory failure requiring intubation mechanical ventilation Respiratory  insufficiency Post respiratory arrest, PEA cardiac arrest Plan: Remains on full mechanical vent support Adult mechanical vent protocol Wean PEEP and  FiO2 to maintain sats above greater than 90% Daily SBT SAT VAP bundle Pulmonary hygiene PAD guidelines sedation Remains on lots of support at this time not able to extubate.  Shock in the setting of ABLA,  Cardiac arrest 6/9>> hemorrhagic  vs septic shock Intraop course notable for 2L EBL, received 3L products. HGB drop again 6/9 to 5.8 Transfused 6/9 with current HGB of 10.2 Plan: Wean pressors to maintain mean arterial pressure greater than 65 mmHg. Follow H&H Transfusion threshold for hemoglobin greater than 7 Follow lactate I suspect its due to poor clearance due to elevated liver enzymes  Transaminitis, shock liver LFTs rising Lactic acidosis Plan: Continue to observe, supportive care Suspect will plateau and continue to improve. Lactate remains elevated due to poor clearance Will give thiamine x3 doses to improve lactate clearance  ESRD on HD MWF Last HD session Monday 6/6, currently utilizing CRRT given hemodynamic instability/pressor requirement and likely inability to tolerate full HD. Plan: Continue CVVHD per nephrology  T2DM, diet controlled CBG low of 83 overnight 6/10 Plan: SSI plus CBGs  OSA on CPAP Plan: Follow-up at home, outpatient follow-up  Hypertension Hyperlipidemia Plan: Holding home blood pressure medications Continue statin  Best practice (evaluated daily):  Diet:  Tube Feed  Pain/Anxiety/Delirium protocol (if indicated): Yes (RASS goal 0) to -1 VAP protocol (if indicated): Yes DVT prophylaxis: Contraindicated GI prophylaxis: PPI Glucose control:  SSI No Central venous access:  Yes, and it is still needed Arterial line:  Yes, and it is still needed Foley:  N/A Mobility:  bed rest  PT consulted: N/A Last date of multidisciplinary goals of care discussion: Pending Code Status:  full code Disposition: ICU   This patient is critically ill with multiple organ system failure; which, requires frequent high complexity decision making,  assessment, support, evaluation, and titration of therapies. This was completed through the application of advanced monitoring technologies and extensive interpretation of multiple databases. During this encounter critical care time was devoted to patient care services described in this note for 34 minutes.  Garner Nash, DO [facility] Mar 21, 2021 8:13 AM

## 2021-03-31 NOTE — Progress Notes (Signed)
eLink Physician-Brief Progress Note Patient Name: Michael Blackwell DOB: 12/17/1940 MRN: [id number]   Date of Service  03/21/2021  HPI/Events of Note  ABG reviewed.  eICU Interventions  FIO2 reduced to 30 %,  Respiratory rate reduced to 24, ABG in one hour.        Kerry Kass [place] Mar 21, 2021, 5:38 AM

## 2021-03-31 NOTE — Progress Notes (Signed)
This RN received handoff report and completed bedside report with off going RN. During this RN's initial assessment, pulses were noted to be weak but able to obtain lft femoral and lft brachial pulses. Automatic cuff BP resulted low, Levo titrated up. Noted that pulse rate began to drop and rhythm change slightly indicating PEA arrest, called for assistance and code to be called, CPR was started right away and 1 amp Epi pushed, elink MD Dr. Gillermina Phy called, code team members took roles, please see Code Blue sheet from here.

## 2021-03-31 NOTE — Progress Notes (Signed)
[facility] PROGRESS NOTE  ASSESSMENT / PLAN: MAKAR SLATTER is a 80 y.o. male status post right iliofemoral endarterectomy and right fem-pop bypass with GSV 03/01/2021 for critical limb ishcemia. Difficult post operative course complicated by respiratory arrest POD#1 after extubation.  CNS: Continue sedation with propofol while intubated. RASS goal -2. PRN pain control.  CV: Labile hemodynamics. Shock picture: cardiogenic v. Septic. Difficult estimating volume status with ESRD and ongoing resuscitation. Wean levophed as able.  Pulm: Respiratory arrest 03/09/21. ABG pre- and post- extubation reassuring. Not sure why arrest occurred. May need more lengthy wean from propofol when we try again. Not yet ready for trial of extubation. GI: NPO/OGT. Continue tube feeding. Transaminitis - ? Related to liver infarcts. Likely from code event continue supportive care. FEN/GU: ESRD on CRRT per nephrology. Monitor electrolytes and acid/base. Heme: H/H stable. No need for transfusion. Will trend CBC. ID: Leukocytosis increasing. Temperature curve flat. CXR clear. Bcx drawn yesterday. Continue empiric antibiotics.  Endo: BG goal 120-180. Prophy: SQH. SCDs. Pepcid.    Dispo: ICU. Guarded.  SUBJECTIVE: Continues to have labile hemodynamics. Persistent hypoglycemia requiring D10. Diarrhea on tube feeding.  OBJECTIVE: BP (!) 79/56   Pulse 80   Temp (!) 95.54 F (35.3 C)   Resp 12   Ht 5\' 10"  (1.778 m)   Wt 77.7 kg   SpO2 (!) 86%   BMI 24.58 kg/m   Intake/Output Summary (Last 24 hours) at 2021-03-26 0739 Last data filed at 03-26-21 0700 Gross per 24 hour  Intake 1253.35 ml  Output 2279 ml  Net -1025.65 ml     Constitutional: Critically ill. Intubated. Sedated. CNS: GCS 3T on my exam. Sedated. Cardiac: Regular. Pulmonary: Unlabored on vent. Abdomen: Soft Vascular: R groin soft. Incisions clean and dry. Biphasic PT signal.  CBC Latest Ref Rng & Units March 26, 2021  March 26, 2021 03/26/2021  WBC 4.0 - 10.5 K/uL - - 13.9(H)  Hemoglobin 13.0 - 17.0 g/dL 11.9(L) 10.9(L) 11.3(L)  Hematocrit 39.0 - 52.0 % 35.0(L) 32.0(L) 32.8(L)  Platelets 150 - 400 K/uL - - 120(L)     CMP Latest Ref Rng & Units March 26, 2021 2021-03-26 03/26/21  Glucose 70 - 99 mg/dL - - 109(H)  BUN 8 - 23 mg/dL - - 19  Creatinine 0.61 - 1.24 mg/dL - - 2.44(H)  Sodium 135 - 145 mmol/L 136 136 137  Potassium 3.5 - 5.1 mmol/L 3.8 4.3 4.3  Chloride 98 - 111 mmol/L - - 100  CO2 22 - 32 mmol/L - - 21(L)  Calcium 8.9 - 10.3 mg/dL - - 8.6(L)  Total Protein 6.5 - 8.1 g/dL - - 5.2(L)  Total Bilirubin 0.3 - 1.2 mg/dL - - 1.0  Alkaline Phos 38 - 126 U/L - - 91  AST 15 - 41 U/L - - 9,053(H)  ALT 0 - 44 U/L - - 1,164(H)    Estimated Creatinine Clearance: 26.2 mL/min (A) (by C-G formula based on SCr of 2.44 mg/dL (H)).  Michael Blackwell. Stanford Breed, MD [facility] Phone Number: [phone] 03-26-21 7:39 AM

## 2021-03-31 NOTE — Progress Notes (Signed)
   Mar 25, 2021 2000  Clinical Encounter Type  Visited With Patient;Health care provider  Visit Type Code  Referral From Nurse  Consult/Referral To Chaplain  The chaplain responded to code. Patient being assessed. Their is no family present. The nursed called family. The family is on their way to the hospital. The chaplain will follow up with the family to provide support as needed.

## 2021-03-31 DEATH — deceased

## 2021-04-08 MED FILL — Medication: Qty: 1 | Status: AC
